# Patient Record
Sex: Female | Born: 1950 | Race: White | Hispanic: No | Marital: Married | Smoking: Never smoker
Health system: Southern US, Community
[De-identification: ages and names within clinical notes are randomized; demographics above are authoritative.]

## PROBLEM LIST (undated history)

## (undated) ENCOUNTER — Emergency Department (HOSPITAL_COMMUNITY): Admission: EM

## (undated) DIAGNOSIS — R319 Hematuria, unspecified: Secondary | ICD-10-CM

## (undated) DIAGNOSIS — N289 Disorder of kidney and ureter, unspecified: Secondary | ICD-10-CM

## (undated) DIAGNOSIS — G4730 Sleep apnea, unspecified: Secondary | ICD-10-CM

## (undated) DIAGNOSIS — N200 Calculus of kidney: Secondary | ICD-10-CM

## (undated) DIAGNOSIS — N6310 Unspecified lump in the right breast, unspecified quadrant: Secondary | ICD-10-CM

## (undated) DIAGNOSIS — E039 Hypothyroidism, unspecified: Secondary | ICD-10-CM

## (undated) DIAGNOSIS — I5032 Chronic diastolic (congestive) heart failure: Secondary | ICD-10-CM

## (undated) DIAGNOSIS — I10 Essential (primary) hypertension: Secondary | ICD-10-CM

## (undated) DIAGNOSIS — Z87442 Personal history of urinary calculi: Secondary | ICD-10-CM

## (undated) DIAGNOSIS — F32A Depression, unspecified: Secondary | ICD-10-CM

## (undated) DIAGNOSIS — G620 Drug-induced polyneuropathy: Secondary | ICD-10-CM

## (undated) DIAGNOSIS — R519 Headache, unspecified: Secondary | ICD-10-CM

## (undated) DIAGNOSIS — F329 Major depressive disorder, single episode, unspecified: Secondary | ICD-10-CM

## (undated) DIAGNOSIS — I341 Nonrheumatic mitral (valve) prolapse: Secondary | ICD-10-CM

## (undated) DIAGNOSIS — T8859XA Other complications of anesthesia, initial encounter: Secondary | ICD-10-CM

## (undated) DIAGNOSIS — T4145XA Adverse effect of unspecified anesthetic, initial encounter: Secondary | ICD-10-CM

## (undated) HISTORY — PX: NECK SURGERY: SHX720

## (undated) HISTORY — PX: APPENDECTOMY: SHX54

## (undated) HISTORY — PX: SHOULDER SURGERY: SHX246

## (undated) HISTORY — PX: TUBAL LIGATION: SHX77

## (undated) HISTORY — PX: CYSTOSCOPY W/ RETROGRADES: SHX1426

## (undated) HISTORY — PX: TONSILLECTOMY: SUR1361

## (undated) HISTORY — PX: CHOLECYSTECTOMY: SHX55

## (undated) SURGERY — Surgical Case
Anesthesia: *Unknown

---

## 1998-03-04 ENCOUNTER — Ambulatory Visit (HOSPITAL_COMMUNITY): Admission: RE | Admit: 1998-03-04 | Discharge: 1998-03-04 | Payer: Self-pay | Admitting: Internal Medicine

## 1998-03-18 ENCOUNTER — Ambulatory Visit (HOSPITAL_COMMUNITY): Admission: RE | Admit: 1998-03-18 | Discharge: 1998-03-18 | Payer: Self-pay

## 2000-01-03 ENCOUNTER — Other Ambulatory Visit: Admission: RE | Admit: 2000-01-03 | Discharge: 2000-01-03 | Payer: Self-pay | Admitting: Obstetrics and Gynecology

## 2000-01-03 ENCOUNTER — Encounter (INDEPENDENT_AMBULATORY_CARE_PROVIDER_SITE_OTHER): Payer: Self-pay | Admitting: Specialist

## 2000-01-10 ENCOUNTER — Encounter: Admission: RE | Admit: 2000-01-10 | Discharge: 2000-01-10 | Payer: Self-pay | Admitting: General Surgery

## 2000-01-10 ENCOUNTER — Encounter: Payer: Self-pay | Admitting: General Surgery

## 2000-04-07 ENCOUNTER — Emergency Department (HOSPITAL_COMMUNITY): Admission: EM | Admit: 2000-04-07 | Discharge: 2000-04-07 | Payer: Self-pay | Admitting: Emergency Medicine

## 2000-04-07 ENCOUNTER — Encounter: Payer: Self-pay | Admitting: Emergency Medicine

## 2000-08-07 ENCOUNTER — Encounter: Payer: Self-pay | Admitting: Orthopedic Surgery

## 2000-08-07 ENCOUNTER — Encounter: Admission: RE | Admit: 2000-08-07 | Discharge: 2000-08-07 | Payer: Self-pay | Admitting: Orthopedic Surgery

## 2000-09-08 ENCOUNTER — Ambulatory Visit (HOSPITAL_BASED_OUTPATIENT_CLINIC_OR_DEPARTMENT_OTHER): Admission: RE | Admit: 2000-09-08 | Discharge: 2000-09-08 | Payer: Self-pay | Admitting: Orthopedic Surgery

## 2002-03-05 ENCOUNTER — Other Ambulatory Visit: Admission: RE | Admit: 2002-03-05 | Discharge: 2002-03-05 | Payer: Self-pay | Admitting: Obstetrics and Gynecology

## 2003-09-10 ENCOUNTER — Other Ambulatory Visit: Admission: RE | Admit: 2003-09-10 | Discharge: 2003-09-10 | Payer: Self-pay | Admitting: Obstetrics and Gynecology

## 2005-03-21 ENCOUNTER — Emergency Department (HOSPITAL_COMMUNITY): Admission: EM | Admit: 2005-03-21 | Discharge: 2005-03-21 | Payer: Self-pay | Admitting: Family Medicine

## 2005-04-08 ENCOUNTER — Emergency Department (HOSPITAL_COMMUNITY): Admission: EM | Admit: 2005-04-08 | Discharge: 2005-04-08 | Payer: Self-pay | Admitting: Family Medicine

## 2005-04-14 ENCOUNTER — Emergency Department (HOSPITAL_COMMUNITY): Admission: EM | Admit: 2005-04-14 | Discharge: 2005-04-14 | Payer: Self-pay | Admitting: Emergency Medicine

## 2005-08-23 ENCOUNTER — Encounter: Admission: RE | Admit: 2005-08-23 | Discharge: 2005-08-23 | Payer: Self-pay | Admitting: General Surgery

## 2006-04-21 ENCOUNTER — Encounter: Admission: RE | Admit: 2006-04-21 | Discharge: 2006-04-21 | Payer: Self-pay | Admitting: Gastroenterology

## 2007-10-05 ENCOUNTER — Encounter: Admission: RE | Admit: 2007-10-05 | Discharge: 2007-10-05 | Payer: Self-pay | Admitting: Surgery

## 2008-07-21 ENCOUNTER — Encounter: Admission: RE | Admit: 2008-07-21 | Discharge: 2008-07-21 | Payer: Self-pay | Admitting: General Surgery

## 2010-01-25 ENCOUNTER — Encounter: Admission: RE | Admit: 2010-01-25 | Discharge: 2010-01-25 | Payer: Self-pay | Admitting: Internal Medicine

## 2010-01-29 ENCOUNTER — Encounter: Admission: RE | Admit: 2010-01-29 | Discharge: 2010-01-29 | Payer: Self-pay | Admitting: Internal Medicine

## 2010-10-14 ENCOUNTER — Encounter
Admission: RE | Admit: 2010-10-14 | Discharge: 2010-10-14 | Payer: Self-pay | Source: Home / Self Care | Attending: Internal Medicine | Admitting: Internal Medicine

## 2011-02-18 NOTE — Op Note (Signed)
[facility]  Patient:    Rebecca Steele, Rebecca Steele                    MRN: [id number] Proc. Date: 09/08/00 Adm. Date:  [id number] Attending:  Milly Jakob                           Operative Report  PREOPERATIVE DIAGNOSIS:  Pain and impingement in the left shoulder.  POSTOPERATIVE DIAGNOSIS: 1. 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 2. Anterolateral impingement with type 3 acromion.  OPERATION PERFORMED: 1. Debridement of superior labral tear anterior to posterior. 2. Anterolateral acromioplasty with debridement of bursal tissue.  SURGEON:  Harvie Junior, M.D.  ASSISTANT:  Currie Paris. Thedore Mins.  ANESTHESIA:  General.  INDICATIONS FOR PROCEDURE:  The patient is a  60 year old female with a long history of having left shoulder pain.  We evaluated her in the office.  She is felt to have obvious clinical signs of impingement.  She improved significantly with injections.  Ultimately, because of persistent pain, she went on to have an MRI which showed that she had significant outlet stenosis but no evidence of rotator cuff tear.  After failing conservative care including stretching, icing, exercises, the patient ultimately was taken to the operating room for arthroscopic subacromial decompression.  DESCRIPTION OF PROCEDURE:  The patient was taken to the operating room and after adequate anesthesia was obtained with general anesthetic, the patient was placed supine on the operating table.  The left shoulder was then prepped and draped in the usual sterile fashion.  Following this, routine arthroscopic examination of the shoulder revealed that there was some looseness up at the superior labral area.  The biceps tendon was well anchored but the superior labrum had a small area of a cleft.  This was debrided with a suction shaver. Attention was turned posteriorly where there was noted to be fairly significant fraying.  This was debrided with a  suction shaver.  Attention was then turned to the rotator cuff insertion which was pristine, no evidence of tearing. There was some undersurface synovial proliferation on the rotator cuff but certainly not dramatic. This was debrided with a suction shaver. Attention was then turned to the subacromial space where the obvious large anterior inferior spur was identified.  This was debrided with a motorized 6.5 mm bur back to a stable situation.  This was done from both the side and the back.  The camera was switched accordingly to make sure an adequate acromioplasty was done both anteriorly and laterally.  The distal clavicle was identified although the [facility] joint was preserved to try to maintain its integrity.  At this point attention was turned to the bursa where a massive bursectomy was undertaken. At this point the shoulder was copiously irrigated and suctioned dry.  The arthroscopic portals were closed with Steri-Strips.  A sterile compressive dressing was applied.  The patient was taken to the recovery room where she was noted to be in satisfactory condition.  Estimated blood loss for this procedure was none. DD:  09/08/00 TD:  09/08/00 Job: [id number]

## 2011-03-28 ENCOUNTER — Encounter (INDEPENDENT_AMBULATORY_CARE_PROVIDER_SITE_OTHER): Payer: Self-pay | Admitting: General Surgery

## 2011-05-19 ENCOUNTER — Other Ambulatory Visit: Payer: Self-pay | Admitting: Internal Medicine

## 2011-05-19 ENCOUNTER — Other Ambulatory Visit: Payer: Self-pay | Admitting: *Deleted

## 2011-05-19 DIAGNOSIS — N63 Unspecified lump in unspecified breast: Secondary | ICD-10-CM

## 2011-05-30 ENCOUNTER — Ambulatory Visit
Admission: RE | Admit: 2011-05-30 | Discharge: 2011-05-30 | Disposition: A | Discharge status: Home / Self Care | Payer: Self-pay | Source: Ambulatory Visit | Attending: Internal Medicine | Admitting: Internal Medicine

## 2011-05-30 DIAGNOSIS — N63 Unspecified lump in unspecified breast: Secondary | ICD-10-CM

## 2011-08-23 ENCOUNTER — Emergency Department (HOSPITAL_BASED_OUTPATIENT_CLINIC_OR_DEPARTMENT_OTHER)
Admission: EM | Admit: 2011-08-23 | Discharge: 2011-08-23 | Disposition: A | Discharge status: Home / Self Care | Payer: BC Managed Care – PPO | Attending: Emergency Medicine | Admitting: Emergency Medicine

## 2011-08-23 ENCOUNTER — Emergency Department (INDEPENDENT_AMBULATORY_CARE_PROVIDER_SITE_OTHER): Payer: BC Managed Care – PPO

## 2011-08-23 ENCOUNTER — Encounter: Payer: Self-pay | Admitting: *Deleted

## 2011-08-23 DIAGNOSIS — N134 Hydroureter: Secondary | ICD-10-CM

## 2011-08-23 DIAGNOSIS — N1330 Unspecified hydronephrosis: Secondary | ICD-10-CM

## 2011-08-23 DIAGNOSIS — R109 Unspecified abdominal pain: Secondary | ICD-10-CM

## 2011-08-23 DIAGNOSIS — G8918 Other acute postprocedural pain: Secondary | ICD-10-CM

## 2011-08-23 HISTORY — DX: Calculus of kidney: N20.0

## 2011-08-23 HISTORY — DX: Depression, unspecified: F32.A

## 2011-08-23 HISTORY — DX: Hematuria, unspecified: R31.9

## 2011-08-23 HISTORY — DX: Nonrheumatic mitral (valve) prolapse: I34.1

## 2011-08-23 HISTORY — DX: Disorder of kidney and ureter, unspecified: N28.9

## 2011-08-23 HISTORY — DX: Essential (primary) hypertension: I10

## 2011-08-23 HISTORY — DX: Major depressive disorder, single episode, unspecified: F32.9

## 2011-08-23 LAB — BASIC METABOLIC PANEL
Calcium: 9 mg/dL (ref 8.4–10.5)
Creatinine, Ser: 1.2 mg/dL — ABNORMAL HIGH (ref 0.50–1.10)
GFR calc Af Amer: 56 mL/min — ABNORMAL LOW (ref >90)

## 2011-08-23 LAB — URINALYSIS, ROUTINE W REFLEX MICROSCOPIC
Ketones, ur: NEGATIVE mg/dL
Protein, ur: NEGATIVE mg/dL
Specific Gravity, Urine: 1.023 (ref 1.005–1.030)
Urobilinogen, UA: 0.2 mg/dL (ref 0.0–1.0)
pH: 7 (ref 5.0–8.0)

## 2011-08-23 LAB — CBC
HCT: 41.1 % (ref 36.0–46.0)
Hemoglobin: 14.3 g/dL (ref 12.0–15.0)
Platelets: 197 10*3/uL (ref 150–400)
RDW: 13.7 % (ref 11.5–15.5)
WBC: 5.7 10*3/uL (ref 4.0–10.5)

## 2011-08-23 LAB — DIFFERENTIAL
Basophils Absolute: 0.1 10*3/uL (ref 0.0–0.1)
Basophils Relative: 1 % (ref 0–1)
Eosinophils Relative: 3 % (ref 0–5)
Lymphs Abs: 1.4 10*3/uL (ref 0.7–4.0)
Monocytes Absolute: 0.8 10*3/uL (ref 0.1–1.0)
Neutro Abs: 3.3 10*3/uL (ref 1.7–7.7)

## 2011-08-23 LAB — URINE MICROSCOPIC-ADD ON

## 2011-08-23 MED ORDER — HYDROMORPHONE HCL PF 1 MG/ML IJ SOLN
1.0000 mg | Freq: Once | INTRAMUSCULAR | Status: AC
  Administered 2011-08-23: 1 mg via INTRAVENOUS
  Filled 2011-08-23: qty 1

## 2011-08-23 MED ORDER — ONDANSETRON HCL 4 MG/2ML IJ SOLN
4.0000 mg | Freq: Once | INTRAMUSCULAR | Status: AC
  Administered 2011-08-23: 4 mg via INTRAVENOUS
  Filled 2011-08-23: qty 2

## 2011-08-23 MED ORDER — OXYCODONE-ACETAMINOPHEN 5-325 MG PO TABS: 1.0000 | ORAL_TABLET | Freq: Four times a day (QID) | ORAL | Status: AC | PRN

## 2011-08-23 MED ORDER — SODIUM CHLORIDE 0.9 % IV BOLUS (SEPSIS)
1000.0000 mL | Freq: Once | INTRAVENOUS | Status: AC
  Administered 2011-08-23: 1000 mL via INTRAVENOUS

## 2011-08-23 MED ORDER — ONDANSETRON 8 MG PO TBDP
8.0000 mg | ORAL_TABLET | Freq: Once | ORAL | Status: AC
  Administered 2011-08-23: 8 mg via ORAL
  Filled 2011-08-23: qty 1

## 2011-08-23 NOTE — ED Notes (Signed)
Pt report severe right side flank pain since 11pm- vomited multiple times- had retrograde pyelogram and cystoscopy today

## 2011-08-23 NOTE — ED Notes (Signed)
Pt sleeping with eyes closed but is arousable, remains on O2@2L  via N/C, PO 97%. Family at bs, SR up x2.

## 2011-08-23 NOTE — ED Notes (Signed)
Pt stood up to use restroom and vomited x1, MD made aware, new orders rec'd and pt medicated per order.

## 2011-08-23 NOTE — ED Notes (Signed)
PO ranging from 88-90% on room air. O2 applied at 2L/min via N/C.

## 2011-08-23 NOTE — ED Provider Notes (Signed)
History     CSN: [id number] Arrival date & time: 08/23/2011  1:49 AM   First MD Initiated Contact with Patient 08/23/11 0157      Chief Complaint  Patient presents with  . Flank Pain    Patient is a 60 y.o. female presenting with flank pain. The history is provided by the patient and a relative.  Flank Pain This is a new problem. The current episode started 3 to 5 hours ago. The problem occurs constantly. The problem has been gradually worsening. Pertinent negatives include no chest pain and no abdominal pain. The symptoms are aggravated by nothing. The symptoms are relieved by nothing.  Pt reports having a cystoscopy and pyelogram yesterday for chronic hematuria (she also reports h/o chronic kidney disease not on dialysis).  No issues with procedure, but several hrs later started having right flank pain with worsening intensity.  She has never had this before.  Nothing improves her symptoms No cp/sob She reports nausea/vomiting as well  PMH - chronic kidney disease  Past Surgical History - appendectomy, cholecystectomy, cystoscopy  No family history on file.  History  Substance Use Topics  . Smoking status: Not on file  . Smokeless tobacco: Not on file  . Alcohol Use: Not on file    OB History    Grav Para Term Preterm Abortions TAB SAB Ect Mult Living                  Review of Systems  Cardiovascular: Negative for chest pain.  Gastrointestinal: Negative for abdominal pain.  Genitourinary: Positive for flank pain.  All other systems reviewed and are negative.    Allergies  Iohexol  Home Medications  No current outpatient prescriptions on file.  BP 146/78  Pulse 78  Temp(Src) 97.7 F (36.5 C) (Oral)  Resp 20  SpO2 98%  Physical Exam  CONSTITUTIONAL: Well developed/well nourished, uncomfortable appearing HEAD AND FACE: Normocephalic/atraumatic EYES: EOMI/PERRL ENMT: Mucous membranes moist NECK: supple no meningeal signs SPINE:entire spine  nontender CV: S1/S2 noted, no murmurs/rubs/gallops noted LUNGS: Lungs are clear to auscultation bilaterally, no apparent distress ABDOMEN: soft, nontender, no rebound or guarding WU:JWJXB cva tenderness, no bruising noted NEURO: Pt is awake/alert, moves all extremitiesx4 EXTREMITIES: pulses normal, full ROM SKIN: warm, color normal PSYCH: no abnormalities of mood noted   ED Course  Procedures    Labs Reviewed  BASIC METABOLIC PANEL  CBC  DIFFERENTIAL  URINALYSIS, ROUTINE W REFLEX MICROSCOPIC   2:09 AM Pt with intense flank pain s/p cysto/pyelogram She appears uncomfortable Vitals stable at this time I placed a call for her urologist since she just had the procedure  2:43 AM I spoke to Dr Brita Romp with urology (on for dr Corbin Ade) We discussed need to get CT imaging If CT unremarkable and pain controlled, can see dr Corbin Ade in the AM Pt reported allergy to IV dye, ct noncontrast ordered  3:41 AM Pt improved I spoke to Dr Brita Romp, urology and we discussed CT findings.  He feels if she is improved she can see dr Pete Glatter later this morning  4:27 AM Pt much improved with her pain, though is somnolent but arousable  6:37 AM Pt is ready for discharge home, I asked her to call dr Pete Glatter later today  MDM  Nursing notes reviewed and considered in documentation All labs/vitals reviewed and considered         Joya Gaskins, MD 08/23/11 [phone]

## 2011-08-23 NOTE — ED Notes (Signed)
MD at bedside. 

## 2011-08-23 NOTE — ED Notes (Signed)
Pt given PO fluids per MD order 

## 2011-08-23 NOTE — ED Notes (Signed)
Pt remains sleepy, O2 decreased to 1L by MD, PO remains in the mid 90's. Family at bs, resps even and unlabored.

## 2011-08-23 NOTE — ED Notes (Signed)
Pt return from CT, requesting more pain meds. MD made aware.

## 2011-08-23 NOTE — ED Notes (Signed)
Transported to CT 

## 2011-08-23 NOTE — ED Notes (Signed)
O2 sats up to 98-100% after O2 applied, MD aware.

## 2012-10-31 ENCOUNTER — Encounter (INDEPENDENT_AMBULATORY_CARE_PROVIDER_SITE_OTHER): Payer: Self-pay | Admitting: Surgery

## 2012-10-31 NOTE — Progress Notes (Signed)
Created to generate my chart sign up

## 2012-11-01 ENCOUNTER — Ambulatory Visit (INDEPENDENT_AMBULATORY_CARE_PROVIDER_SITE_OTHER): Payer: Self-pay | Admitting: Surgery

## 2012-11-01 DIAGNOSIS — Z0000 Encounter for general adult medical examination without abnormal findings: Secondary | ICD-10-CM

## 2014-04-21 ENCOUNTER — Other Ambulatory Visit: Payer: Self-pay | Admitting: Internal Medicine

## 2014-04-21 DIAGNOSIS — Z1231 Encounter for screening mammogram for malignant neoplasm of breast: Secondary | ICD-10-CM

## 2014-04-29 ENCOUNTER — Ambulatory Visit
Admission: RE | Admit: 2014-04-29 | Discharge: 2014-04-29 | Disposition: A | Discharge status: Home / Self Care | Payer: BC Managed Care – PPO | Source: Ambulatory Visit | Attending: Internal Medicine | Admitting: Internal Medicine

## 2014-04-29 DIAGNOSIS — Z1231 Encounter for screening mammogram for malignant neoplasm of breast: Secondary | ICD-10-CM

## 2014-06-08 ENCOUNTER — Encounter (HOSPITAL_BASED_OUTPATIENT_CLINIC_OR_DEPARTMENT_OTHER): Payer: Self-pay | Admitting: Emergency Medicine

## 2014-06-08 ENCOUNTER — Emergency Department (HOSPITAL_BASED_OUTPATIENT_CLINIC_OR_DEPARTMENT_OTHER)
Admission: EM | Admit: 2014-06-08 | Discharge: 2014-06-08 | Disposition: A | Discharge status: Home / Self Care | Payer: BC Managed Care – PPO | Attending: Emergency Medicine | Admitting: Emergency Medicine

## 2014-06-08 ENCOUNTER — Emergency Department (HOSPITAL_BASED_OUTPATIENT_CLINIC_OR_DEPARTMENT_OTHER): Payer: BC Managed Care – PPO

## 2014-06-08 DIAGNOSIS — Z8659 Personal history of other mental and behavioral disorders: Secondary | ICD-10-CM | POA: Insufficient documentation

## 2014-06-08 DIAGNOSIS — Y929 Unspecified place or not applicable: Secondary | ICD-10-CM | POA: Diagnosis not present

## 2014-06-08 DIAGNOSIS — W540XXA Bitten by dog, initial encounter: Secondary | ICD-10-CM | POA: Diagnosis not present

## 2014-06-08 DIAGNOSIS — Y939 Activity, unspecified: Secondary | ICD-10-CM | POA: Insufficient documentation

## 2014-06-08 DIAGNOSIS — Z87448 Personal history of other diseases of urinary system: Secondary | ICD-10-CM | POA: Diagnosis not present

## 2014-06-08 DIAGNOSIS — S61259A Open bite of unspecified finger without damage to nail, initial encounter: Secondary | ICD-10-CM

## 2014-06-08 DIAGNOSIS — I10 Essential (primary) hypertension: Secondary | ICD-10-CM | POA: Insufficient documentation

## 2014-06-08 DIAGNOSIS — Z87442 Personal history of urinary calculi: Secondary | ICD-10-CM | POA: Diagnosis not present

## 2014-06-08 DIAGNOSIS — Z23 Encounter for immunization: Secondary | ICD-10-CM | POA: Diagnosis not present

## 2014-06-08 DIAGNOSIS — S61209A Unspecified open wound of unspecified finger without damage to nail, initial encounter: Secondary | ICD-10-CM | POA: Diagnosis present

## 2014-06-08 MED ORDER — AMOXICILLIN-POT CLAVULANATE 875-125 MG PO TABS
1.0000 | ORAL_TABLET | Freq: Once | ORAL | Status: AC
  Administered 2014-06-08: 1 via ORAL
  Filled 2014-06-08: qty 1

## 2014-06-08 MED ORDER — ONDANSETRON 4 MG PO TBDP
4.0000 mg | ORAL_TABLET | Freq: Once | ORAL | Status: AC
  Administered 2014-06-08: 4 mg via ORAL
  Filled 2014-06-08: qty 1

## 2014-06-08 MED ORDER — LIDOCAINE HCL 2 % IJ SOLN
INTRAMUSCULAR | Status: AC
  Filled 2014-06-08: qty 20

## 2014-06-08 MED ORDER — RABIES IMMUNE GLOBULIN 150 UNIT/ML IM INJ
20.0000 [IU]/kg | INJECTION | Freq: Once | INTRAMUSCULAR | Status: AC
  Administered 2014-06-08: 1575 [IU] via INTRAMUSCULAR
  Filled 2014-06-08: qty 12

## 2014-06-08 MED ORDER — TETANUS-DIPHTH-ACELL PERTUSSIS 5-2.5-18.5 LF-MCG/0.5 IM SUSP
0.5000 mL | Freq: Once | INTRAMUSCULAR | Status: AC
  Administered 2014-06-08: 0.5 mL via INTRAMUSCULAR
  Filled 2014-06-08: qty 0.5

## 2014-06-08 MED ORDER — AMOXICILLIN-POT CLAVULANATE 875-125 MG PO TABS: 1.0000 | ORAL_TABLET | Freq: Two times a day (BID) | ORAL | Status: DC

## 2014-06-08 MED ORDER — RABIES VACCINE, PCEC IM SUSR
1.0000 mL | Freq: Once | INTRAMUSCULAR | Status: AC
  Administered 2014-06-08: 1 mL via INTRAMUSCULAR
  Filled 2014-06-08: qty 1

## 2014-06-08 NOTE — Discharge Instructions (Signed)

## 2014-06-08 NOTE — ED Provider Notes (Signed)
CSN: [id number]     Arrival date & time 06/08/14  2020 History  This chart was scribed for Ephraim Hamburger, MD by Cathie Hoops, ED Scribe. The patient was seen in [place]. The patient's care was started at 9:35 PM.       Chief Complaint  Patient presents with  . Animal Bite   The history is provided by the patient. No language interpreter was used.   HPI Comments: Rebecca Steele is a 63 y.o. female who presents to the Emergency Department complaining of a dog bite on the right pinky finger a few hours ago. Pt is able to bend her fingers. Pt has bitten by an unknown dog. Pt states the dog looked like a terrier. Pt is unable to specify her last tetanus shot. Pt filled out an animal control form. The dog was never caught. Pt notes she does have some moderate pain at the injury site and describes it as throbbing.  Pt can take Augmentin and notes she has some nausea with its usage with no other symptoms.   Past Medical History  Diagnosis Date  . Hematuria - cause not known   . Kidney stones   . Renal disease   . Hypertension   . Mitral valve prolapse   . Depression    Past Surgical History  Procedure Laterality Date  . Appendectomy    . Neck surgery    . Cystoscopy w/ retrogrades    . Shoulder surgery    . Cholecystectomy    . Tonsillectomy     No family history on file. History  Substance Use Topics  . Smoking status: Never Smoker   . Smokeless tobacco: Not on file  . Alcohol Use: No   OB History   Grav Para Term Preterm Abortions TAB SAB Ect Mult Living                 Review of Systems  Skin: Positive for wound.  All other systems reviewed and are negative.     Allergies  Valium; Doxycycline; Iohexol; and Macrodantin  Home Medications   Prior to Admission medications   Not on File   Triage Vitals: BP 146/82  Pulse 87  Temp(Src) 98 F (36.7 C) (Oral)  Resp 16  Ht 5\' 7"  (1.702 m)  Wt 173 lb (78.472 kg)  BMI 27.09 kg/m2  SpO2 98% Physical Exam   Nursing note and vitals reviewed. Constitutional: She is oriented to person, place, and time. She appears well-developed and well-nourished. No distress.  HENT:  Head: Normocephalic and atraumatic.  Cardiovascular: Normal rate and intact distal pulses.   Pulmonary/Chest: Effort normal. No respiratory distress.  Musculoskeletal: Normal range of motion.       Hands: Neurological: She is alert and oriented to person, place, and time.  Skin: Skin is warm and dry.  Psychiatric: She has a normal mood and affect. Her behavior is normal.    ED Course  Procedures (including critical care time) DIAGNOSTIC STUDIES: Oxygen Saturation is 98% on Ra, normal by my interpretation.    COORDINATION OF CARE: 9:38 PM- Pt denies pain medication at this time. Patient informed of current plan for treatment and evaluation and agrees with plan at this time.    Labs Review Labs Reviewed - No data to display  Imaging Review Dg Finger Little Right  06/08/2014   CLINICAL DATA:  Dog bite to 5th finger  EXAM: RIGHT LITTLE FINGER 2+V  COMPARISON:  None.  FINDINGS: No fracture or  dislocation is seen.  The joint spaces are preserved.  The visualized soft tissues are unremarkable.  No radiopaque foreign body is seen.  IMPRESSION: No fracture, dislocation, or radiopaque foreign body is seen.   Electronically Signed   By: Julian Hy M.D.   On: 06/08/2014 22:04     EKG Interpretation None      MDM   Final diagnoses:  Dog bite of finger, initial encounter    Patient with dog bite from unknown dog that was not caught. Due to this, will be given rabies vaccine and immunoglobulin. Nurse injected IG at side and then rest as an IM injection. Tdap also updated. Xray is benign, and injury appears minimal. Will refer to PCP and/or hand. Given augmentin for prophylaxis given site of bite, and discussed strict return precautions. Patient will f/u here or urgent care for her rabies vaccines.  This chart was scribed  in my presence and reviewed by me personally.   Ephraim Hamburger, MD 06/09/14 [id number]

## 2014-06-08 NOTE — ED Notes (Signed)
Pt bite on right 5th finger by unknown dog. Abrasion noted to finger. Bleeding controlled

## 2014-06-11 ENCOUNTER — Encounter (HOSPITAL_COMMUNITY): Payer: Self-pay | Admitting: Emergency Medicine

## 2014-06-11 ENCOUNTER — Emergency Department (HOSPITAL_COMMUNITY)
Admission: EM | Admit: 2014-06-11 | Discharge: 2014-06-11 | Disposition: A | Discharge status: Home / Self Care | Payer: BC Managed Care – PPO | Source: Home / Self Care

## 2014-06-11 DIAGNOSIS — Z203 Contact with and (suspected) exposure to rabies: Secondary | ICD-10-CM

## 2014-06-11 MED ORDER — RABIES VACCINE, PCEC IM SUSR
1.0000 mL | Freq: Once | INTRAMUSCULAR | Status: AC
  Administered 2014-06-11: 1 mL via INTRAMUSCULAR

## 2014-06-11 MED ORDER — RABIES VACCINE, PCEC IM SUSR
INTRAMUSCULAR | Status: AC
  Filled 2014-06-11: qty 1

## 2014-06-11 NOTE — ED Notes (Signed)
Pt here  For  Rabies  Vaccine      verbalizes  No   Complaints

## 2014-06-11 NOTE — Discharge Instructions (Signed)
Return  As  Directed  For   [place]  If  Any  Problems

## 2014-06-15 ENCOUNTER — Encounter (HOSPITAL_COMMUNITY): Payer: Self-pay | Admitting: Emergency Medicine

## 2014-06-15 ENCOUNTER — Emergency Department (INDEPENDENT_AMBULATORY_CARE_PROVIDER_SITE_OTHER)
Admission: EM | Admit: 2014-06-15 | Discharge: 2014-06-15 | Disposition: A | Discharge status: Home / Self Care | Payer: BC Managed Care – PPO | Source: Home / Self Care

## 2014-06-15 DIAGNOSIS — Z203 Contact with and (suspected) exposure to rabies: Secondary | ICD-10-CM

## 2014-06-15 MED ORDER — RABIES VACCINE, PCEC IM SUSR
1.0000 mL | Freq: Once | INTRAMUSCULAR | Status: AC
  Administered 2014-06-15: 1 mL via INTRAMUSCULAR

## 2014-06-15 MED ORDER — RABIES VACCINE, PCEC IM SUSR
INTRAMUSCULAR | Status: AC
  Filled 2014-06-15: qty 1

## 2014-06-15 NOTE — ED Notes (Signed)
Patient bitten by unknown dog 9/6.  9/6 is day zero.  Patient here today for next injection in rabies series.  Today is day 7 in rabies series.

## 2014-06-15 NOTE — Discharge Instructions (Signed)
Return to ucc on 06/22/2014 for final rabies injection.  Return sooner if any questions

## 2014-06-22 ENCOUNTER — Emergency Department (HOSPITAL_COMMUNITY)
Admission: EM | Admit: 2014-06-22 | Discharge: 2014-06-22 | Disposition: A | Discharge status: Home / Self Care | Payer: BC Managed Care – PPO | Source: Home / Self Care | Attending: Family Medicine | Admitting: Family Medicine

## 2014-06-22 ENCOUNTER — Encounter (HOSPITAL_COMMUNITY): Payer: Self-pay | Admitting: Emergency Medicine

## 2014-06-22 DIAGNOSIS — R05 Cough: Secondary | ICD-10-CM

## 2014-06-22 DIAGNOSIS — Z203 Contact with and (suspected) exposure to rabies: Secondary | ICD-10-CM

## 2014-06-22 DIAGNOSIS — R059 Cough, unspecified: Secondary | ICD-10-CM

## 2014-06-22 MED ORDER — GUAIFENESIN-CODEINE 100-10 MG/5ML PO SOLN: 5.0000 mL | Freq: Every evening | ORAL | Status: DC | PRN

## 2014-06-22 MED ORDER — RABIES VACCINE, PCEC IM SUSR
INTRAMUSCULAR | Status: AC
  Filled 2014-06-22: qty 1

## 2014-06-22 MED ORDER — RABIES VACCINE, PCEC IM SUSR
1.0000 mL | Freq: Once | INTRAMUSCULAR | Status: AC
Start: 2014-06-22 — End: 2014-06-22
  Administered 2014-06-22: 1 mL via INTRAMUSCULAR

## 2014-06-22 NOTE — ED Provider Notes (Signed)
Rebecca Steele is a 64 y.o. female who presents to Urgent Care today for cough. Patient is a one-day history of cough. The cough occurred last night and interfered with sleep. She denies any fevers or chills nausea vomiting or diarrhea. She notes mild sneezing. She's tried over-the-counter allergy medicine which helped.  Patient is also here for her rabies vaccination.   Past Medical History  Diagnosis Date  . Hematuria - cause not known   . Kidney stones   . Renal disease   . Hypertension   . Mitral valve prolapse   . Depression    History  Substance Use Topics  . Smoking status: Never Smoker   . Smokeless tobacco: Not on file  . Alcohol Use: No   ROS as above Medications: No current facility-administered medications for this encounter.   Current Outpatient Prescriptions  Medication Sig Dispense Refill  . guaiFENesin-codeine 100-10 MG/5ML syrup Take 5 mLs by mouth at bedtime as needed for cough.  120 mL  0    Exam:  BP 127/91  Pulse 86  Temp(Src) 99.7 F (37.6 C) (Oral)  Resp 16  SpO2 95% Gen: Well NAD HEENT: EOMI,  MMM posterior pharynx with cobblestoning. Normal tympanic membranes bilaterally. Lungs: Normal work of breathing. CTABL Heart: RRR no MRG Abd: NABS, Soft. Nondistended, Nontender Exts: Brisk capillary refill, warm and well perfused.   No results found for this or any previous visit (from the past 24 hour(s)). No results found.  Assessment and Plan: 63 y.o. female with  1) cough. Likely viral. Patient declined nasal spray. Trial of codeine containing cough medication. Continue over-the-counter allergy medications. 2) rabies vaccine: Continue per protocol  Discussed warning signs or symptoms. Please see discharge instructions. Patient expresses understanding.     Gregor Hams, MD 06/22/14 1537

## 2014-06-22 NOTE — Discharge Instructions (Signed)
Thank you for coming in today. Use cough medication as needed Call or go to the emergency room if you get worse, have trouble breathing, have chest pains, or palpitations.    Cough, Adult  A cough is a reflex that helps clear your throat and airways. It can help heal the body or may be a reaction to an irritated airway. A cough may only last 2 or 3 weeks (acute) or may last more than 8 weeks (chronic).  CAUSES Acute cough:  Viral or bacterial infections. Chronic cough:  Infections.  Allergies.  Asthma.  Post-nasal drip.  Smoking.  Heartburn or acid reflux.  Some medicines.  Chronic lung problems (COPD).  Cancer. SYMPTOMS   Cough.  Fever.  Chest pain.  Increased breathing rate.  High-pitched whistling sound when breathing (wheezing).  Colored mucus that you cough up (sputum). TREATMENT   A bacterial cough may be treated with antibiotic medicine.  A viral cough must run its course and will not respond to antibiotics.  Your caregiver may recommend other treatments if you have a chronic cough. HOME CARE INSTRUCTIONS   Only take over-the-counter or prescription medicines for pain, discomfort, or fever as directed by your caregiver. Use cough suppressants only as directed by your caregiver.  Use a cold steam vaporizer or humidifier in your bedroom or home to help loosen secretions.  Sleep in a semi-upright position if your cough is worse at night.  Rest as needed.  Stop smoking if you smoke. SEEK IMMEDIATE MEDICAL CARE IF:   You have pus in your sputum.  Your cough starts to worsen.  You cannot control your cough with suppressants and are losing sleep.  You begin coughing up blood.  You have difficulty breathing.  You develop pain which is getting worse or is uncontrolled with medicine.  You have a fever. MAKE SURE YOU:   Understand these instructions.  Will watch your condition.  Will get help right away if you are not doing well or get  worse. Document Released: 03/18/2011 Document Revised: 12/12/2011 Document Reviewed: 03/18/2011 [facility] Patient Information 2015 [place], [place]. This information is not intended to replace advice given to you by your health care provider. Make sure you discuss any questions you have with your health care provider.

## 2014-06-22 NOTE — ED Notes (Signed)
Patient complains of cough that started last night

## 2014-12-02 ENCOUNTER — Other Ambulatory Visit: Payer: Self-pay | Admitting: Internal Medicine

## 2014-12-02 DIAGNOSIS — N63 Unspecified lump in unspecified breast: Secondary | ICD-10-CM

## 2014-12-04 ENCOUNTER — Ambulatory Visit
Admission: RE | Admit: 2014-12-04 | Discharge: 2014-12-04 | Disposition: A | Discharge status: Home / Self Care | Payer: BLUE CROSS/BLUE SHIELD | Source: Ambulatory Visit | Attending: Internal Medicine | Admitting: Internal Medicine

## 2014-12-04 DIAGNOSIS — N63 Unspecified lump in unspecified breast: Secondary | ICD-10-CM

## 2015-05-07 ENCOUNTER — Other Ambulatory Visit: Payer: Self-pay | Admitting: Orthopedic Surgery

## 2015-05-13 ENCOUNTER — Encounter (HOSPITAL_BASED_OUTPATIENT_CLINIC_OR_DEPARTMENT_OTHER): Payer: Self-pay | Admitting: *Deleted

## 2015-05-13 ENCOUNTER — Other Ambulatory Visit: Payer: Self-pay | Admitting: Orthopedic Surgery

## 2015-05-13 NOTE — H&P (Addendum)
Rebecca Steele is an 64 y.o. female.   CC / Reason for Visit: Right long finger MCP joint pain HPI: This patient returns for reevaluation, indicating that she is better but still not 100% well.  She reports that she has a dull ache and some tightness into her right long finger when she plays piano.  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  She states that she has not taken any medications nor has she tried ice.  She denies any one mechanism of injury and reports that she did attempt rest which helped somewhat but also increased the stiffness into that joint.  Past Medical History  Diagnosis Date  . Hematuria - cause not known   . Kidney stones   . Renal disease   . Hypertension   . Mitral valve prolapse   . Depression     Past Surgical History  Procedure Laterality Date  . Appendectomy    . Neck surgery    . Cystoscopy w/ retrogrades    . Shoulder surgery    . Cholecystectomy    . Tonsillectomy      No family history on file. Social History:  reports that she has never smoked. She does not have any smokeless tobacco history on file. She reports that she does not drink alcohol or use illicit drugs.  Allergies:  Allergies  Allergen Reactions  . Valium Anaphylaxis  . Doxycycline Swelling  . Iohexol      Desc: Pt. stated she had iv contrast around 25 yrs ago and had itching on her neck.  she took benadryl and it went away.  The rad recommended premeds and be done tomorrow but the pt.did not want to do that so we did her w/o iv contrast today., Onset Date: [id number]   . Macrodantin Nausea And Vomiting    No prescriptions prior to admission    No results found for this or any previous visit (from the past 48 hour(s)). No results found.  Review of Systems  All other systems reviewed and are negative.   There  were no vitals taken for this visit. Physical Exam  Constitutional:  WD, WN, NAD HEENT:  NCAT, EOMI Neuro/Psych:  Alert & oriented to person, place, and time; appropriate mood & affect Lymphatic: No generalized UE edema or lymphadenopathy Extremities / MSK:  Both UE are normal with respect to appearance, ranges of motion, joint stability, muscle strength/tone, sensation, & perfusion except as otherwise noted:  Mild tenderness over the A1 pulley with some mild catching noted.  Labs / X-rays:  None today.  Assessment: Suspected right long finger stenosing tenosynovitis--improved somewhat but not completely healed.  Plan:  Findings were discussed with the patient.   Right  long finger trigger finger release was discussed. The details of the operative procedure were discussed with the patient.  Questions were invited and answered.  In addition to the goal of the procedure, the risks of the and anesthetic risks were reviewed.  No specific outcome was guaranteed or implied.  Informed consent was obtained.   Shashank Kwasnik A. 05/13/2015, 12:14 PM

## 2015-05-13 NOTE — Progress Notes (Signed)
EKG obtained from Dr. Ellouise Newer.  Recent BMET shows K of 3.1, pt starting on potassium supplement today.  Will recheck [facility].

## 2015-05-18 ENCOUNTER — Encounter (HOSPITAL_BASED_OUTPATIENT_CLINIC_OR_DEPARTMENT_OTHER): Payer: Self-pay | Admitting: *Deleted

## 2015-05-18 ENCOUNTER — Ambulatory Visit (HOSPITAL_BASED_OUTPATIENT_CLINIC_OR_DEPARTMENT_OTHER)
Admission: RE | Admit: 2015-05-18 | Discharge: 2015-05-18 | Disposition: A | Discharge status: Home / Self Care | Payer: BLUE CROSS/BLUE SHIELD | Source: Ambulatory Visit | Attending: Orthopedic Surgery | Admitting: Orthopedic Surgery

## 2015-05-18 ENCOUNTER — Ambulatory Visit (HOSPITAL_BASED_OUTPATIENT_CLINIC_OR_DEPARTMENT_OTHER): Payer: BLUE CROSS/BLUE SHIELD | Admitting: Certified Registered"

## 2015-05-18 ENCOUNTER — Encounter (HOSPITAL_BASED_OUTPATIENT_CLINIC_OR_DEPARTMENT_OTHER)
Admission: RE | Disposition: A | Discharge status: Home / Self Care | Payer: Self-pay | Source: Ambulatory Visit | Attending: Orthopedic Surgery

## 2015-05-18 DIAGNOSIS — I10 Essential (primary) hypertension: Secondary | ICD-10-CM | POA: Insufficient documentation

## 2015-05-18 DIAGNOSIS — Z87442 Personal history of urinary calculi: Secondary | ICD-10-CM | POA: Diagnosis not present

## 2015-05-18 DIAGNOSIS — Z885 Allergy status to narcotic agent status: Secondary | ICD-10-CM | POA: Diagnosis not present

## 2015-05-18 DIAGNOSIS — I341 Nonrheumatic mitral (valve) prolapse: Secondary | ICD-10-CM | POA: Diagnosis not present

## 2015-05-18 DIAGNOSIS — M65331 Trigger finger, right middle finger: Secondary | ICD-10-CM | POA: Diagnosis not present

## 2015-05-18 DIAGNOSIS — Z881 Allergy status to other antibiotic agents status: Secondary | ICD-10-CM | POA: Insufficient documentation

## 2015-05-18 DIAGNOSIS — Z888 Allergy status to other drugs, medicaments and biological substances status: Secondary | ICD-10-CM | POA: Diagnosis not present

## 2015-05-18 DIAGNOSIS — F329 Major depressive disorder, single episode, unspecified: Secondary | ICD-10-CM | POA: Insufficient documentation

## 2015-05-18 HISTORY — PX: TRIGGER FINGER RELEASE: SHX641

## 2015-05-18 SURGERY — RELEASE, A1 PULLEY, FOR TRIGGER FINGER
Anesthesia: General | Site: Finger | Laterality: Right

## 2015-05-18 MED ORDER — LACTATED RINGERS IV SOLN: INTRAVENOUS | Status: DC

## 2015-05-18 MED ORDER — OXYCODONE HCL 5 MG PO TABS: 5.0000 mg | ORAL_TABLET | Freq: Once | ORAL | Status: DC | PRN

## 2015-05-18 MED ORDER — PROPOFOL INFUSION 10 MG/ML OPTIME
INTRAVENOUS | Status: DC | PRN
  Administered 2015-05-18: 75 ug/kg/min via INTRAVENOUS

## 2015-05-18 MED ORDER — HYDROCODONE-ACETAMINOPHEN 5-325 MG PO TABS: 1.0000 | ORAL_TABLET | ORAL | Status: DC | PRN

## 2015-05-18 MED ORDER — LIDOCAINE HCL (CARDIAC) 20 MG/ML IV SOLN
INTRAVENOUS | Status: DC | PRN
  Administered 2015-05-18: 60 mg via INTRAVENOUS

## 2015-05-18 MED ORDER — GLYCOPYRROLATE 0.2 MG/ML IJ SOLN: 0.2000 mg | Freq: Once | INTRAMUSCULAR | Status: DC | PRN

## 2015-05-18 MED ORDER — OXYCODONE HCL 5 MG/5ML PO SOLN: 5.0000 mg | Freq: Once | ORAL | Status: DC | PRN

## 2015-05-18 MED ORDER — LIDOCAINE HCL 2 % IJ SOLN
INTRAMUSCULAR | Status: DC | PRN
  Administered 2015-05-18: 2.5 mL

## 2015-05-18 MED ORDER — MIDAZOLAM HCL 2 MG/2ML IJ SOLN
INTRAMUSCULAR | Status: AC
  Filled 2015-05-18: qty 2

## 2015-05-18 MED ORDER — ONDANSETRON HCL 4 MG/2ML IJ SOLN: 4.0000 mg | Freq: Once | INTRAMUSCULAR | Status: DC | PRN

## 2015-05-18 MED ORDER — LACTATED RINGERS IV SOLN
INTRAVENOUS | Status: DC
  Administered 2015-05-18 (×2): via INTRAVENOUS

## 2015-05-18 MED ORDER — 0.9 % SODIUM CHLORIDE (POUR BTL) OPTIME
TOPICAL | Status: DC | PRN
  Administered 2015-05-18: 1000 mL

## 2015-05-18 MED ORDER — FENTANYL CITRATE (PF) 100 MCG/2ML IJ SOLN
INTRAMUSCULAR | Status: AC
  Filled 2015-05-18: qty 4

## 2015-05-18 MED ORDER — CEFAZOLIN SODIUM-DEXTROSE 2-3 GM-% IV SOLR
INTRAVENOUS | Status: AC
  Filled 2015-05-18: qty 50

## 2015-05-18 MED ORDER — FENTANYL CITRATE (PF) 100 MCG/2ML IJ SOLN
INTRAMUSCULAR | Status: DC | PRN
  Administered 2015-05-18: 50 ug via INTRAVENOUS

## 2015-05-18 MED ORDER — HYDROMORPHONE HCL 1 MG/ML IJ SOLN: 0.2500 mg | INTRAMUSCULAR | Status: DC | PRN

## 2015-05-18 MED ORDER — SCOPOLAMINE 1 MG/3DAYS TD PT72: 1.0000 | MEDICATED_PATCH | Freq: Once | TRANSDERMAL | Status: DC | PRN

## 2015-05-18 MED ORDER — CEFAZOLIN SODIUM-DEXTROSE 2-3 GM-% IV SOLR
2.0000 g | INTRAVENOUS | Status: AC
  Administered 2015-05-18: 2 g via INTRAVENOUS

## 2015-05-18 MED ORDER — BUPIVACAINE-EPINEPHRINE 0.5% -1:200000 IJ SOLN
INTRAMUSCULAR | Status: DC | PRN
  Administered 2015-05-18: 2.5 mL

## 2015-05-18 MED ORDER — ONDANSETRON HCL 4 MG/2ML IJ SOLN
INTRAMUSCULAR | Status: DC | PRN
  Administered 2015-05-18: 4 mg via INTRAVENOUS

## 2015-05-18 SURGICAL SUPPLY — 42 items
BLADE MINI RND TIP GREEN BEAV (BLADE) IMPLANT
BLADE SURG 15 STRL LF DISP TIS (BLADE) ×1 IMPLANT
BLADE SURG 15 STRL SS (BLADE) ×2
BNDG CMPR 9X4 STRL LF SNTH (GAUZE/BANDAGES/DRESSINGS) ×1
BNDG COHESIVE 1X5 TAN STRL LF (GAUZE/BANDAGES/DRESSINGS) ×1 IMPLANT
BNDG CONFORM 2 STRL LF (GAUZE/BANDAGES/DRESSINGS) ×2 IMPLANT
BNDG ESMARK 4X9 LF (GAUZE/BANDAGES/DRESSINGS) ×2 IMPLANT
CHLORAPREP W/TINT 26ML (MISCELLANEOUS) ×2 IMPLANT
COVER BACK TABLE 60X90IN (DRAPES) ×2 IMPLANT
COVER MAYO STAND STRL (DRAPES) ×2 IMPLANT
CUFF TOURNIQUET SINGLE 18IN (TOURNIQUET CUFF) IMPLANT
DRAPE EXTREMITY T 121X128X90 (DRAPE) ×2 IMPLANT
DRAPE SURG 17X23 STRL (DRAPES) ×2 IMPLANT
DRSG EMULSION OIL 3X3 NADH (GAUZE/BANDAGES/DRESSINGS) ×2 IMPLANT
GLOVE BIO SURGEON STRL SZ7.5 (GLOVE) ×2 IMPLANT
GLOVE BIOGEL PI IND STRL 7.0 (GLOVE) ×1 IMPLANT
GLOVE BIOGEL PI IND STRL 8 (GLOVE) ×1 IMPLANT
GLOVE BIOGEL PI INDICATOR 7.0 (GLOVE) ×1
GLOVE BIOGEL PI INDICATOR 8 (GLOVE) ×1
GLOVE ECLIPSE 6.5 STRL STRAW (GLOVE) ×2 IMPLANT
GOWN STRL REUS W/ TWL LRG LVL3 (GOWN DISPOSABLE) ×2 IMPLANT
GOWN STRL REUS W/TWL LRG LVL3 (GOWN DISPOSABLE) ×4
GOWN STRL REUS W/TWL XL LVL3 (GOWN DISPOSABLE) ×2 IMPLANT
NDL HYPO 25X1 1.5 SAFETY (NEEDLE) IMPLANT
NDL SAFETY ECLIPSE 18X1.5 (NEEDLE) IMPLANT
NEEDLE HYPO 18GX1.5 SHARP (NEEDLE)
NEEDLE HYPO 25X1 1.5 SAFETY (NEEDLE) ×2 IMPLANT
NS IRRIG 1000ML POUR BTL (IV SOLUTION) ×2 IMPLANT
PACK BASIN DAY SURGERY FS (CUSTOM PROCEDURE TRAY) ×2 IMPLANT
PADDING CAST ABS 4INX4YD NS (CAST SUPPLIES)
PADDING CAST ABS COTTON 4X4 ST (CAST SUPPLIES) IMPLANT
PADDING CAST COTTON 2X4 NS (CAST SUPPLIES) IMPLANT
SPONGE GAUZE 4X4 12PLY STER LF (GAUZE/BANDAGES/DRESSINGS) ×2 IMPLANT
STOCKINETTE 6  STRL (DRAPES) ×1
STOCKINETTE 6 STRL (DRAPES) ×1 IMPLANT
SUT VICRYL RAPIDE 4-0 (SUTURE) IMPLANT
SUT VICRYL RAPIDE 4/0 PS 2 (SUTURE) IMPLANT
SYR BULB 3OZ (MISCELLANEOUS) ×2 IMPLANT
SYRINGE 10CC LL (SYRINGE) ×1 IMPLANT
TOWEL OR 17X24 6PK STRL BLUE (TOWEL DISPOSABLE) ×2 IMPLANT
TOWEL OR NON WOVEN STRL DISP B (DISPOSABLE) IMPLANT
UNDERPAD 30X30 (UNDERPADS AND DIAPERS) ×2 IMPLANT

## 2015-05-18 NOTE — Anesthesia Procedure Notes (Signed)
Procedure Name: MAC Date/Time: 05/18/2015 11:39 AM Performed by: Adrick Kestler D Pre-anesthesia Checklist: Patient identified, Emergency Drugs available, Suction available, Patient being monitored and Timeout performed Patient Re-evaluated:Patient Re-evaluated prior to inductionOxygen Delivery Method: Simple face mask

## 2015-05-18 NOTE — Anesthesia Preprocedure Evaluation (Addendum)
Anesthesia Evaluation  Patient identified by MRN, date of birth, ID band Patient awake    Reviewed: Allergy & Precautions, NPO status   Airway Mallampati: II   Neck ROM: Full    Dental  (+) Teeth Intact, Dental Advisory Given   Pulmonary  breath sounds clear to auscultation        Cardiovascular hypertension, Pt. on medications Rhythm:Regular     Neuro/Psych PSYCHIATRIC DISORDERS Depression    GI/Hepatic   Endo/Other    Renal/GU      Musculoskeletal   Abdominal   Peds  Hematology   Anesthesia Other Findings   Reproductive/Obstetrics                            Anesthesia Physical Anesthesia Plan  ASA: II  Anesthesia Plan: General   Post-op Pain Management:    Induction: Intravenous  Airway Management Planned: LMA  Additional Equipment:   Intra-op Plan:   Post-operative Plan: Extubation in OR  Informed Consent: I have reviewed the patients History and Physical, chart, labs and discussed the procedure including the risks, benefits and alternatives for the proposed anesthesia with the patient or authorized representative who has indicated his/her understanding and acceptance.   Dental advisory given  Plan Discussed with: CRNA and Anesthesiologist  Anesthesia Plan Comments:         Anesthesia Quick Evaluation

## 2015-05-18 NOTE — Discharge Instructions (Signed)
Discharge Instructions ° ° °You have a light dressing on your hand.  °You may begin gentle motion of your fingers and hand immediately, but you should not do any heavy lifting or gripping.  Elevate your hand to reduce pain & swelling of the digits.  Ice over the operative site may be helpful to reduce pain & swelling.  DO NOT USE HEAT. °Pain medicine has been prescribed for you.  °Use your medicine as needed over the first 48 hours, and then you can begin to taper your use. You may use Tylenol in place of your prescribed pain medication, but not IN ADDITION to it. °Leave the dressing in place until the third day after your surgery and then remove it, leaving it open to air.  °After the bandage has been removed you may shower, regularly washing the incision and letting the water run over it, but not submerging it (no swimming, soaking it in dishwater, etc.) °You may drive a car when you are off of prescription pain medications and can safely control your vehicle with both hands. °We will address whether therapy will be required or not when you return to the office. °You may have already made your follow-up appointment when we completed your preop visit.  If not, please call our office today or the next business day to make your return appointment for 10-15 days after surgery. ° ° °Please call [phone] during normal business hours or [phone] after hours for any problems. Including the following: ° °- excessive redness of the incisions °- drainage for more than 4 days °- fever of more than 101.5 F ° °*Please note that pain medications will not be refilled after hours or on weekends. °Post Anesthesia Home Care Instructions ° °Activity: °Get plenty of rest for the remainder of the day. A responsible adult should stay with you for 24 hours following the procedure.  °For the next 24 hours, DO NOT: °-Drive a car °-Operate machinery °-Drink alcoholic beverages °-Take any medication unless instructed by your  physician °-Make any legal decisions or sign important papers. ° °Meals: °Start with liquid foods such as gelatin or soup. Progress to regular foods as tolerated. Avoid greasy, spicy, heavy foods. If nausea and/or vomiting occur, drink only clear liquids until the nausea and/or vomiting subsides. Call your physician if vomiting continues. ° °Special Instructions/Symptoms: °Your throat may feel dry or sore from the anesthesia or the breathing tube placed in your throat during surgery. If this causes discomfort, gargle with warm salt water. The discomfort should disappear within 24 hours. ° °If you had a scopolamine patch placed behind your ear for the management of post- operative nausea and/or vomiting: ° °1. The medication in the patch is effective for 72 hours, after which it should be removed.  Wrap patch in a tissue and discard in the trash. Wash hands thoroughly with soap and water. °2. You may remove the patch earlier than 72 hours if you experience unpleasant side effects which may include dry mouth, dizziness or visual disturbances. °3. Avoid touching the patch. Wash your hands with soap and water after contact with the patch. °  ° °

## 2015-05-18 NOTE — Anesthesia Postprocedure Evaluation (Signed)
  Anesthesia Post-op Note  Patient: Rebecca Steele  Procedure(s) Performed: Procedure(s): RIGHT  LONG FINGER TRIGGER RELEASE  (Right)  Patient Location: PACU  Anesthesia Type:MAC  Level of Consciousness: awake, alert  and oriented  Airway and Oxygen Therapy: Patient Spontanous Breathing  Post-op Pain: none  Post-op Assessment: Post-op Vital signs reviewed, Patient's Cardiovascular Status Stable, Respiratory Function Stable, Patent Airway and Pain level controlled              Post-op Vital Signs: stable  Last Vitals:  Filed Vitals:   05/18/15 1300  BP: 133/72  Pulse: 69  Temp: 36.9 C  Resp: 20    Complications: No apparent anesthesia complications

## 2015-05-18 NOTE — Interval H&P Note (Signed)
History and Physical Interval Note:  05/18/2015 10:11 AM  Rebecca Steele  has presented today for surgery, with the diagnosis of RIGHT  LONG FINGER TRIGGER   The various methods of treatment have been discussed with the patient and family. After consideration of risks, benefits and other options for treatment, the patient has consented to  Procedure(s): RIGHT  LONG FINGER TRIGGER RELEASE  (Right) as a surgical intervention .  The patient's history has been reviewed, patient examined, no change in status, stable for surgery.  I have reviewed the patient's chart and labs.  Questions were answered to the patient's satisfaction.     Ritu Gagliardo A.

## 2015-05-18 NOTE — Transfer of Care (Signed)
Immediate Anesthesia Transfer of Care Note  Patient: Rebecca Steele  Procedure(s) Performed: Procedure(s): RIGHT  LONG FINGER TRIGGER RELEASE  (Right)  Patient Location: PACU  Anesthesia Type:MAC  Level of Consciousness: awake, alert , oriented and patient cooperative  Airway & Oxygen Therapy: Patient Spontanous Breathing and Patient connected to face mask oxygen  Post-op Assessment: Report given to RN  Post vital signs: Reviewed and stable  Last Vitals:  Filed Vitals:   05/18/15 0922  BP: 116/79  Pulse: 64  Temp: 36.8 C  Resp: 20    Complications: No apparent anesthesia complications

## 2015-05-18 NOTE — Op Note (Signed)
05/18/2015  10:12 AM  PATIENT:  Rebecca Steele  64 y.o. female  PRE-OPERATIVE DIAGNOSIS:  Right long finger trigger digit  POST-OPERATIVE DIAGNOSIS:  Same  PROCEDURE:  Right long finger trigger release  SURGEON: Rayvon Char. Grandville Silos, MD  PHYSICIAN ASSISTANT: None  ANESTHESIA:  local and MAC  SPECIMENS:  None  DRAINS:   None  EBL:  less than 50 mL  PREOPERATIVE INDICATIONS:  [name] is a  64 y.o. female with right long finger trigger digit that responded transiently to nonoperative management.  The risks benefits and alternatives were discussed with the patient preoperatively including but not limited to the risks of infection, bleeding, nerve injury, cardiopulmonary complications, the need for revision surgery, among others, and the patient verbalized understanding and consented to proceed.  OPERATIVE IMPLANTS: None  OPERATIVE PROCEDURE:  After receiving prophylactic antibiotics, the patient was escorted to the operative theatre and placed in a supine position.  A surgical "time-out" was performed during which the planned procedure, proposed operative site, and the correct patient identity were compared to the operative consent and agreement confirmed by the circulating nurse according to current facility policy. A digital block was performed with a mixture of lidocaine and Marcaine bearing epinephrine. Following application of a tourniquet to the operative extremity, the exposed skin was prepped with Chloraprep and draped in the usual sterile fashion.  The limb was exsanguinated with an Esmarch bandage and the tourniquet inflated to approximately 163mmHg higher than systolic BP.  An oblique incision was made over the A1 pulley of the long finger paralleling the distal wrist palmar crease. Subcutaneous tissues were dissected with blunt and spreading dissection. The A1 pulley was identified and split longitudinally in the midline. There was some additional crossing bands  proximal this that was split as well. The tendon was pulled into view, found to have thickened tenosynovium underlying the region encircled by the A1 pulley. This was debrided. The tendon was returned to its bed, the wound irrigated, tourniquet released. Additional hemostasis wasn't necessary and the skin was closed with 4-0 Vicryl Rapide interrupted sutures. A light dressing was applied and she was taken to the recovery room.  DISPOSITION: She'll be discharged home today with typical instructions, returning in 10-15 days.

## 2015-05-19 ENCOUNTER — Encounter (HOSPITAL_BASED_OUTPATIENT_CLINIC_OR_DEPARTMENT_OTHER): Payer: Self-pay | Admitting: Orthopedic Surgery

## 2016-08-08 DIAGNOSIS — E559 Vitamin D deficiency, unspecified: Secondary | ICD-10-CM | POA: Diagnosis not present

## 2016-08-08 DIAGNOSIS — Z0000 Encounter for general adult medical examination without abnormal findings: Secondary | ICD-10-CM | POA: Diagnosis not present

## 2016-08-08 DIAGNOSIS — I10 Essential (primary) hypertension: Secondary | ICD-10-CM | POA: Diagnosis not present

## 2016-08-08 DIAGNOSIS — D751 Secondary polycythemia: Secondary | ICD-10-CM | POA: Diagnosis not present

## 2016-08-08 DIAGNOSIS — Z79899 Other long term (current) drug therapy: Secondary | ICD-10-CM | POA: Diagnosis not present

## 2016-08-08 DIAGNOSIS — G4700 Insomnia, unspecified: Secondary | ICD-10-CM | POA: Diagnosis not present

## 2016-08-08 DIAGNOSIS — R0602 Shortness of breath: Secondary | ICD-10-CM | POA: Diagnosis not present

## 2016-08-09 DIAGNOSIS — R69 Illness, unspecified: Secondary | ICD-10-CM | POA: Diagnosis not present

## 2016-08-24 DIAGNOSIS — G5762 Lesion of plantar nerve, left lower limb: Secondary | ICD-10-CM | POA: Diagnosis not present

## 2016-08-24 DIAGNOSIS — G5761 Lesion of plantar nerve, right lower limb: Secondary | ICD-10-CM | POA: Diagnosis not present

## 2016-08-24 DIAGNOSIS — G5763 Lesion of plantar nerve, bilateral lower limbs: Secondary | ICD-10-CM | POA: Diagnosis not present

## 2016-08-24 DIAGNOSIS — E7800 Pure hypercholesterolemia, unspecified: Secondary | ICD-10-CM | POA: Diagnosis not present

## 2016-09-28 DIAGNOSIS — R05 Cough: Secondary | ICD-10-CM | POA: Diagnosis not present

## 2016-09-28 DIAGNOSIS — R69 Illness, unspecified: Secondary | ICD-10-CM | POA: Diagnosis not present

## 2016-09-28 DIAGNOSIS — I10 Essential (primary) hypertension: Secondary | ICD-10-CM | POA: Diagnosis not present

## 2016-09-28 DIAGNOSIS — J018 Other acute sinusitis: Secondary | ICD-10-CM | POA: Diagnosis not present

## 2016-10-03 HISTORY — PX: BREAST EXCISIONAL BIOPSY: SUR124

## 2016-10-11 DIAGNOSIS — R69 Illness, unspecified: Secondary | ICD-10-CM | POA: Diagnosis not present

## 2016-10-11 DIAGNOSIS — G2581 Restless legs syndrome: Secondary | ICD-10-CM | POA: Diagnosis not present

## 2016-10-11 DIAGNOSIS — G4733 Obstructive sleep apnea (adult) (pediatric): Secondary | ICD-10-CM | POA: Diagnosis not present

## 2016-10-11 DIAGNOSIS — I10 Essential (primary) hypertension: Secondary | ICD-10-CM | POA: Diagnosis not present

## 2016-10-26 DIAGNOSIS — H608X3 Other otitis externa, bilateral: Secondary | ICD-10-CM | POA: Diagnosis not present

## 2016-10-30 DIAGNOSIS — G4733 Obstructive sleep apnea (adult) (pediatric): Secondary | ICD-10-CM | POA: Diagnosis not present

## 2016-10-30 DIAGNOSIS — G4720 Circadian rhythm sleep disorder, unspecified type: Secondary | ICD-10-CM | POA: Diagnosis not present

## 2016-10-30 DIAGNOSIS — R0902 Hypoxemia: Secondary | ICD-10-CM | POA: Diagnosis not present

## 2016-11-03 DIAGNOSIS — J029 Acute pharyngitis, unspecified: Secondary | ICD-10-CM | POA: Diagnosis not present

## 2016-11-03 DIAGNOSIS — I10 Essential (primary) hypertension: Secondary | ICD-10-CM | POA: Diagnosis not present

## 2016-11-03 DIAGNOSIS — R69 Illness, unspecified: Secondary | ICD-10-CM | POA: Diagnosis not present

## 2016-11-03 DIAGNOSIS — J018 Other acute sinusitis: Secondary | ICD-10-CM | POA: Diagnosis not present

## 2016-11-08 DIAGNOSIS — I10 Essential (primary) hypertension: Secondary | ICD-10-CM | POA: Diagnosis not present

## 2016-11-08 DIAGNOSIS — G4733 Obstructive sleep apnea (adult) (pediatric): Secondary | ICD-10-CM | POA: Diagnosis not present

## 2016-11-08 DIAGNOSIS — E782 Mixed hyperlipidemia: Secondary | ICD-10-CM | POA: Diagnosis not present

## 2016-11-08 DIAGNOSIS — G2581 Restless legs syndrome: Secondary | ICD-10-CM | POA: Diagnosis not present

## 2016-11-22 DIAGNOSIS — R0602 Shortness of breath: Secondary | ICD-10-CM | POA: Diagnosis not present

## 2016-11-22 DIAGNOSIS — J209 Acute bronchitis, unspecified: Secondary | ICD-10-CM | POA: Diagnosis not present

## 2016-11-22 DIAGNOSIS — H6641 Suppurative otitis media, unspecified, right ear: Secondary | ICD-10-CM | POA: Diagnosis not present

## 2016-12-08 DIAGNOSIS — I10 Essential (primary) hypertension: Secondary | ICD-10-CM | POA: Diagnosis not present

## 2016-12-08 DIAGNOSIS — G4730 Sleep apnea, unspecified: Secondary | ICD-10-CM | POA: Diagnosis not present

## 2016-12-08 DIAGNOSIS — G4701 Insomnia due to medical condition: Secondary | ICD-10-CM | POA: Diagnosis not present

## 2016-12-08 DIAGNOSIS — M542 Cervicalgia: Secondary | ICD-10-CM | POA: Diagnosis not present

## 2016-12-08 DIAGNOSIS — Z0000 Encounter for general adult medical examination without abnormal findings: Secondary | ICD-10-CM | POA: Diagnosis not present

## 2016-12-08 DIAGNOSIS — Z6828 Body mass index (BMI) 28.0-28.9, adult: Secondary | ICD-10-CM | POA: Diagnosis not present

## 2016-12-08 DIAGNOSIS — R69 Illness, unspecified: Secondary | ICD-10-CM | POA: Diagnosis not present

## 2016-12-08 DIAGNOSIS — J309 Allergic rhinitis, unspecified: Secondary | ICD-10-CM | POA: Diagnosis not present

## 2016-12-13 DIAGNOSIS — E7800 Pure hypercholesterolemia, unspecified: Secondary | ICD-10-CM | POA: Diagnosis not present

## 2016-12-13 DIAGNOSIS — R05 Cough: Secondary | ICD-10-CM | POA: Diagnosis not present

## 2016-12-13 DIAGNOSIS — R0602 Shortness of breath: Secondary | ICD-10-CM | POA: Diagnosis not present

## 2016-12-13 DIAGNOSIS — J209 Acute bronchitis, unspecified: Secondary | ICD-10-CM | POA: Diagnosis not present

## 2016-12-13 DIAGNOSIS — D539 Nutritional anemia, unspecified: Secondary | ICD-10-CM | POA: Diagnosis not present

## 2016-12-13 DIAGNOSIS — I517 Cardiomegaly: Secondary | ICD-10-CM | POA: Diagnosis not present

## 2016-12-13 DIAGNOSIS — Z79899 Other long term (current) drug therapy: Secondary | ICD-10-CM | POA: Diagnosis not present

## 2016-12-13 DIAGNOSIS — J029 Acute pharyngitis, unspecified: Secondary | ICD-10-CM | POA: Diagnosis not present

## 2016-12-14 DIAGNOSIS — I10 Essential (primary) hypertension: Secondary | ICD-10-CM | POA: Diagnosis not present

## 2016-12-14 DIAGNOSIS — G4733 Obstructive sleep apnea (adult) (pediatric): Secondary | ICD-10-CM | POA: Diagnosis not present

## 2016-12-14 DIAGNOSIS — R69 Illness, unspecified: Secondary | ICD-10-CM | POA: Diagnosis not present

## 2016-12-14 DIAGNOSIS — R0602 Shortness of breath: Secondary | ICD-10-CM | POA: Diagnosis not present

## 2016-12-14 DIAGNOSIS — R942 Abnormal results of pulmonary function studies: Secondary | ICD-10-CM | POA: Diagnosis not present

## 2016-12-16 DIAGNOSIS — R29810 Facial weakness: Secondary | ICD-10-CM | POA: Diagnosis not present

## 2016-12-16 DIAGNOSIS — R942 Abnormal results of pulmonary function studies: Secondary | ICD-10-CM | POA: Diagnosis not present

## 2016-12-16 DIAGNOSIS — R0989 Other specified symptoms and signs involving the circulatory and respiratory systems: Secondary | ICD-10-CM | POA: Diagnosis not present

## 2016-12-16 DIAGNOSIS — I517 Cardiomegaly: Secondary | ICD-10-CM | POA: Diagnosis not present

## 2016-12-16 DIAGNOSIS — M79606 Pain in leg, unspecified: Secondary | ICD-10-CM | POA: Diagnosis not present

## 2016-12-28 DIAGNOSIS — G4733 Obstructive sleep apnea (adult) (pediatric): Secondary | ICD-10-CM | POA: Diagnosis not present

## 2016-12-28 DIAGNOSIS — R05 Cough: Secondary | ICD-10-CM | POA: Diagnosis not present

## 2017-02-03 DIAGNOSIS — L821 Other seborrheic keratosis: Secondary | ICD-10-CM | POA: Diagnosis not present

## 2017-02-03 DIAGNOSIS — Z809 Family history of malignant neoplasm, unspecified: Secondary | ICD-10-CM | POA: Diagnosis not present

## 2017-02-03 DIAGNOSIS — D3610 Benign neoplasm of peripheral nerves and autonomic nervous system, unspecified: Secondary | ICD-10-CM | POA: Diagnosis not present

## 2017-02-03 DIAGNOSIS — L570 Actinic keratosis: Secondary | ICD-10-CM | POA: Diagnosis not present

## 2017-02-03 DIAGNOSIS — L578 Other skin changes due to chronic exposure to nonionizing radiation: Secondary | ICD-10-CM | POA: Diagnosis not present

## 2017-02-03 DIAGNOSIS — D485 Neoplasm of uncertain behavior of skin: Secondary | ICD-10-CM | POA: Diagnosis not present

## 2017-02-03 DIAGNOSIS — D235 Other benign neoplasm of skin of trunk: Secondary | ICD-10-CM | POA: Diagnosis not present

## 2017-02-09 DIAGNOSIS — R69 Illness, unspecified: Secondary | ICD-10-CM | POA: Diagnosis not present

## 2017-03-07 DIAGNOSIS — Z23 Encounter for immunization: Secondary | ICD-10-CM | POA: Diagnosis not present

## 2017-03-07 DIAGNOSIS — Z6827 Body mass index (BMI) 27.0-27.9, adult: Secondary | ICD-10-CM | POA: Diagnosis not present

## 2017-03-07 DIAGNOSIS — I10 Essential (primary) hypertension: Secondary | ICD-10-CM | POA: Diagnosis not present

## 2017-03-07 DIAGNOSIS — R69 Illness, unspecified: Secondary | ICD-10-CM | POA: Diagnosis not present

## 2017-03-07 DIAGNOSIS — G43009 Migraine without aura, not intractable, without status migrainosus: Secondary | ICD-10-CM | POA: Diagnosis not present

## 2017-05-12 DIAGNOSIS — Z6827 Body mass index (BMI) 27.0-27.9, adult: Secondary | ICD-10-CM | POA: Diagnosis not present

## 2017-05-12 DIAGNOSIS — J069 Acute upper respiratory infection, unspecified: Secondary | ICD-10-CM | POA: Diagnosis not present

## 2017-05-12 DIAGNOSIS — J329 Chronic sinusitis, unspecified: Secondary | ICD-10-CM | POA: Diagnosis not present

## 2017-05-27 DIAGNOSIS — A499 Bacterial infection, unspecified: Secondary | ICD-10-CM | POA: Diagnosis not present

## 2017-05-27 DIAGNOSIS — R300 Dysuria: Secondary | ICD-10-CM | POA: Diagnosis not present

## 2017-05-27 DIAGNOSIS — N390 Urinary tract infection, site not specified: Secondary | ICD-10-CM | POA: Diagnosis not present

## 2017-06-27 DIAGNOSIS — Z23 Encounter for immunization: Secondary | ICD-10-CM | POA: Diagnosis not present

## 2017-08-11 DIAGNOSIS — M549 Dorsalgia, unspecified: Secondary | ICD-10-CM | POA: Diagnosis not present

## 2017-08-11 DIAGNOSIS — N12 Tubulo-interstitial nephritis, not specified as acute or chronic: Secondary | ICD-10-CM | POA: Diagnosis not present

## 2017-08-11 DIAGNOSIS — N390 Urinary tract infection, site not specified: Secondary | ICD-10-CM | POA: Diagnosis not present

## 2017-08-15 ENCOUNTER — Other Ambulatory Visit: Payer: Self-pay | Admitting: Family Medicine

## 2017-08-15 ENCOUNTER — Ambulatory Visit
Admission: RE | Admit: 2017-08-15 | Discharge: 2017-08-15 | Disposition: A | Discharge status: Home / Self Care | Payer: BLUE CROSS/BLUE SHIELD | Source: Ambulatory Visit | Attending: Family Medicine | Admitting: Family Medicine

## 2017-08-15 DIAGNOSIS — R1084 Generalized abdominal pain: Secondary | ICD-10-CM

## 2017-08-15 DIAGNOSIS — N12 Tubulo-interstitial nephritis, not specified as acute or chronic: Secondary | ICD-10-CM | POA: Diagnosis not present

## 2017-08-15 DIAGNOSIS — M549 Dorsalgia, unspecified: Secondary | ICD-10-CM | POA: Diagnosis not present

## 2017-08-15 DIAGNOSIS — Z6827 Body mass index (BMI) 27.0-27.9, adult: Secondary | ICD-10-CM | POA: Diagnosis not present

## 2017-08-15 DIAGNOSIS — R52 Pain, unspecified: Secondary | ICD-10-CM

## 2017-08-15 DIAGNOSIS — R0781 Pleurodynia: Secondary | ICD-10-CM | POA: Diagnosis not present

## 2017-08-15 DIAGNOSIS — R319 Hematuria, unspecified: Secondary | ICD-10-CM | POA: Diagnosis not present

## 2017-08-21 DIAGNOSIS — Z6826 Body mass index (BMI) 26.0-26.9, adult: Secondary | ICD-10-CM | POA: Diagnosis not present

## 2017-08-21 DIAGNOSIS — N12 Tubulo-interstitial nephritis, not specified as acute or chronic: Secondary | ICD-10-CM | POA: Diagnosis not present

## 2017-08-31 DIAGNOSIS — Z0000 Encounter for general adult medical examination without abnormal findings: Secondary | ICD-10-CM | POA: Diagnosis not present

## 2017-08-31 DIAGNOSIS — R946 Abnormal results of thyroid function studies: Secondary | ICD-10-CM | POA: Diagnosis not present

## 2017-09-04 DIAGNOSIS — Z0000 Encounter for general adult medical examination without abnormal findings: Secondary | ICD-10-CM | POA: Diagnosis not present

## 2017-09-04 DIAGNOSIS — E876 Hypokalemia: Secondary | ICD-10-CM | POA: Diagnosis not present

## 2017-09-04 DIAGNOSIS — R319 Hematuria, unspecified: Secondary | ICD-10-CM | POA: Diagnosis not present

## 2017-09-04 DIAGNOSIS — I10 Essential (primary) hypertension: Secondary | ICD-10-CM | POA: Diagnosis not present

## 2017-09-08 DIAGNOSIS — R350 Frequency of micturition: Secondary | ICD-10-CM | POA: Diagnosis not present

## 2017-09-08 DIAGNOSIS — N393 Stress incontinence (female) (male): Secondary | ICD-10-CM | POA: Diagnosis not present

## 2017-09-08 DIAGNOSIS — R3121 Asymptomatic microscopic hematuria: Secondary | ICD-10-CM | POA: Diagnosis not present

## 2017-09-08 DIAGNOSIS — R351 Nocturia: Secondary | ICD-10-CM | POA: Diagnosis not present

## 2017-09-28 DIAGNOSIS — R946 Abnormal results of thyroid function studies: Secondary | ICD-10-CM | POA: Diagnosis not present

## 2017-09-28 DIAGNOSIS — E782 Mixed hyperlipidemia: Secondary | ICD-10-CM | POA: Diagnosis not present

## 2017-09-28 DIAGNOSIS — I10 Essential (primary) hypertension: Secondary | ICD-10-CM | POA: Diagnosis not present

## 2017-10-02 DIAGNOSIS — E782 Mixed hyperlipidemia: Secondary | ICD-10-CM | POA: Diagnosis not present

## 2017-10-02 DIAGNOSIS — E039 Hypothyroidism, unspecified: Secondary | ICD-10-CM | POA: Diagnosis not present

## 2017-10-02 DIAGNOSIS — I10 Essential (primary) hypertension: Secondary | ICD-10-CM | POA: Diagnosis not present

## 2017-10-02 DIAGNOSIS — Z6827 Body mass index (BMI) 27.0-27.9, adult: Secondary | ICD-10-CM | POA: Diagnosis not present

## 2017-10-02 DIAGNOSIS — R3129 Other microscopic hematuria: Secondary | ICD-10-CM | POA: Diagnosis not present

## 2017-10-02 DIAGNOSIS — R3121 Asymptomatic microscopic hematuria: Secondary | ICD-10-CM | POA: Diagnosis not present

## 2017-10-06 DIAGNOSIS — R3121 Asymptomatic microscopic hematuria: Secondary | ICD-10-CM | POA: Diagnosis not present

## 2017-10-06 DIAGNOSIS — N393 Stress incontinence (female) (male): Secondary | ICD-10-CM | POA: Diagnosis not present

## 2017-10-09 ENCOUNTER — Other Ambulatory Visit: Payer: Self-pay | Admitting: Family Medicine

## 2017-10-09 DIAGNOSIS — N8500 Endometrial hyperplasia, unspecified: Secondary | ICD-10-CM

## 2017-10-10 ENCOUNTER — Ambulatory Visit
Admission: RE | Admit: 2017-10-10 | Discharge: 2017-10-10 | Disposition: A | Discharge status: Home / Self Care | Payer: Medicare HMO | Source: Ambulatory Visit | Attending: Family Medicine | Admitting: Family Medicine

## 2017-10-10 DIAGNOSIS — N8500 Endometrial hyperplasia, unspecified: Secondary | ICD-10-CM | POA: Diagnosis not present

## 2017-10-11 DIAGNOSIS — Z6827 Body mass index (BMI) 27.0-27.9, adult: Secondary | ICD-10-CM | POA: Diagnosis not present

## 2017-10-11 DIAGNOSIS — N814 Uterovaginal prolapse, unspecified: Secondary | ICD-10-CM | POA: Diagnosis not present

## 2017-10-11 DIAGNOSIS — N8500 Endometrial hyperplasia, unspecified: Secondary | ICD-10-CM | POA: Diagnosis not present

## 2017-10-11 DIAGNOSIS — N8110 Cystocele, unspecified: Secondary | ICD-10-CM | POA: Diagnosis not present

## 2017-10-16 DIAGNOSIS — R69 Illness, unspecified: Secondary | ICD-10-CM | POA: Diagnosis not present

## 2017-10-17 DIAGNOSIS — N8182 Incompetence or weakening of pubocervical tissue: Secondary | ICD-10-CM | POA: Diagnosis not present

## 2017-10-17 DIAGNOSIS — R32 Unspecified urinary incontinence: Secondary | ICD-10-CM | POA: Diagnosis not present

## 2017-10-17 DIAGNOSIS — R9389 Abnormal findings on diagnostic imaging of other specified body structures: Secondary | ICD-10-CM | POA: Diagnosis not present

## 2017-10-24 ENCOUNTER — Other Ambulatory Visit: Payer: Self-pay | Admitting: Family Medicine

## 2017-10-24 DIAGNOSIS — Z139 Encounter for screening, unspecified: Secondary | ICD-10-CM

## 2017-10-26 DIAGNOSIS — N8500 Endometrial hyperplasia, unspecified: Secondary | ICD-10-CM | POA: Diagnosis not present

## 2017-11-08 DIAGNOSIS — N8500 Endometrial hyperplasia, unspecified: Secondary | ICD-10-CM | POA: Diagnosis not present

## 2017-11-08 DIAGNOSIS — N840 Polyp of corpus uteri: Secondary | ICD-10-CM | POA: Diagnosis not present

## 2017-11-08 DIAGNOSIS — N858 Other specified noninflammatory disorders of uterus: Secondary | ICD-10-CM | POA: Diagnosis not present

## 2017-11-17 ENCOUNTER — Ambulatory Visit
Admission: RE | Admit: 2017-11-17 | Discharge: 2017-11-17 | Disposition: A | Discharge status: Home / Self Care | Payer: Medicare HMO | Source: Ambulatory Visit | Attending: Family Medicine | Admitting: Family Medicine

## 2017-11-17 DIAGNOSIS — Z1231 Encounter for screening mammogram for malignant neoplasm of breast: Secondary | ICD-10-CM | POA: Diagnosis not present

## 2017-11-17 DIAGNOSIS — Z139 Encounter for screening, unspecified: Secondary | ICD-10-CM

## 2017-11-20 DIAGNOSIS — E039 Hypothyroidism, unspecified: Secondary | ICD-10-CM | POA: Diagnosis not present

## 2017-11-20 DIAGNOSIS — I10 Essential (primary) hypertension: Secondary | ICD-10-CM | POA: Diagnosis not present

## 2017-11-20 DIAGNOSIS — E782 Mixed hyperlipidemia: Secondary | ICD-10-CM | POA: Diagnosis not present

## 2017-11-21 ENCOUNTER — Other Ambulatory Visit: Payer: Self-pay | Admitting: Family Medicine

## 2017-11-21 DIAGNOSIS — R928 Other abnormal and inconclusive findings on diagnostic imaging of breast: Secondary | ICD-10-CM

## 2017-11-24 ENCOUNTER — Other Ambulatory Visit: Payer: Self-pay | Admitting: Family Medicine

## 2017-11-24 ENCOUNTER — Ambulatory Visit
Admission: RE | Admit: 2017-11-24 | Discharge: 2017-11-24 | Disposition: A | Discharge status: Home / Self Care | Payer: Medicare HMO | Source: Ambulatory Visit | Attending: Family Medicine | Admitting: Family Medicine

## 2017-11-24 DIAGNOSIS — N6489 Other specified disorders of breast: Secondary | ICD-10-CM | POA: Diagnosis not present

## 2017-11-24 DIAGNOSIS — R922 Inconclusive mammogram: Secondary | ICD-10-CM | POA: Diagnosis not present

## 2017-11-24 DIAGNOSIS — R928 Other abnormal and inconclusive findings on diagnostic imaging of breast: Secondary | ICD-10-CM

## 2017-11-24 DIAGNOSIS — Z6827 Body mass index (BMI) 27.0-27.9, adult: Secondary | ICD-10-CM | POA: Diagnosis not present

## 2017-11-24 DIAGNOSIS — I10 Essential (primary) hypertension: Secondary | ICD-10-CM | POA: Diagnosis not present

## 2017-11-24 DIAGNOSIS — E039 Hypothyroidism, unspecified: Secondary | ICD-10-CM | POA: Diagnosis not present

## 2017-11-24 DIAGNOSIS — E782 Mixed hyperlipidemia: Secondary | ICD-10-CM | POA: Diagnosis not present

## 2017-11-28 ENCOUNTER — Other Ambulatory Visit: Payer: Self-pay | Admitting: Family Medicine

## 2017-11-28 ENCOUNTER — Ambulatory Visit
Admission: RE | Admit: 2017-11-28 | Discharge: 2017-11-28 | Disposition: A | Discharge status: Home / Self Care | Payer: Medicare HMO | Source: Ambulatory Visit | Attending: Family Medicine | Admitting: Family Medicine

## 2017-11-28 DIAGNOSIS — N6489 Other specified disorders of breast: Secondary | ICD-10-CM | POA: Diagnosis not present

## 2017-11-28 DIAGNOSIS — R928 Other abnormal and inconclusive findings on diagnostic imaging of breast: Secondary | ICD-10-CM

## 2017-11-28 DIAGNOSIS — N6312 Unspecified lump in the right breast, upper inner quadrant: Secondary | ICD-10-CM | POA: Diagnosis not present

## 2017-12-01 ENCOUNTER — Other Ambulatory Visit: Payer: Self-pay | Admitting: General Surgery

## 2017-12-01 DIAGNOSIS — N6489 Other specified disorders of breast: Secondary | ICD-10-CM | POA: Diagnosis not present

## 2017-12-04 ENCOUNTER — Other Ambulatory Visit: Payer: Self-pay

## 2017-12-04 ENCOUNTER — Encounter (HOSPITAL_BASED_OUTPATIENT_CLINIC_OR_DEPARTMENT_OTHER): Payer: Self-pay | Admitting: *Deleted

## 2017-12-05 ENCOUNTER — Encounter (HOSPITAL_BASED_OUTPATIENT_CLINIC_OR_DEPARTMENT_OTHER)
Admission: RE | Admit: 2017-12-05 | Discharge: 2017-12-05 | Disposition: A | Discharge status: Home / Self Care | Payer: Medicare HMO | Source: Ambulatory Visit | Attending: General Surgery | Admitting: General Surgery

## 2017-12-05 DIAGNOSIS — R9431 Abnormal electrocardiogram [ECG] [EKG]: Secondary | ICD-10-CM | POA: Insufficient documentation

## 2017-12-05 DIAGNOSIS — I10 Essential (primary) hypertension: Secondary | ICD-10-CM | POA: Diagnosis not present

## 2017-12-05 DIAGNOSIS — Z01810 Encounter for preprocedural cardiovascular examination: Secondary | ICD-10-CM | POA: Diagnosis not present

## 2017-12-05 NOTE — Pre-Procedure Instructions (Signed)
Pt in for PAT, EKG done Ensure pre op drink given and instructions reviewed.

## 2017-12-07 ENCOUNTER — Ambulatory Visit
Admission: RE | Admit: 2017-12-07 | Discharge: 2017-12-07 | Disposition: A | Discharge status: Home / Self Care | Payer: Medicare HMO | Source: Ambulatory Visit | Attending: General Surgery | Admitting: General Surgery

## 2017-12-07 DIAGNOSIS — N6489 Other specified disorders of breast: Secondary | ICD-10-CM

## 2017-12-07 DIAGNOSIS — C50911 Malignant neoplasm of unspecified site of right female breast: Secondary | ICD-10-CM | POA: Diagnosis not present

## 2017-12-11 NOTE — Anesthesia Preprocedure Evaluation (Signed)
Anesthesia Evaluation  Patient identified by MRN, date of birth, ID band Patient awake    Reviewed: Allergy & Precautions, NPO status   Airway Mallampati: II   Neck ROM: Full    Dental  (+) Teeth Intact, Dental Advisory Given   Pulmonary sleep apnea ,    breath sounds clear to auscultation       Cardiovascular hypertension, Pt. on medications  Rhythm:Regular     Neuro/Psych PSYCHIATRIC DISORDERS Depression    GI/Hepatic   Endo/Other  Hypothyroidism   Renal/GU Renal disease     Musculoskeletal   Abdominal   Peds  Hematology   Anesthesia Other Findings   Reproductive/Obstetrics                             Anesthesia Physical  Anesthesia Plan  ASA: II  Anesthesia Plan: General   Post-op Pain Management:    Induction: Intravenous  PONV Risk Score and Plan: 3 and Ondansetron and Dexamethasone  Airway Management Planned: LMA  Additional Equipment:   Intra-op Plan:   Post-operative Plan: Extubation in OR  Informed Consent: I have reviewed the patients History and Physical, chart, labs and discussed the procedure including the risks, benefits and alternatives for the proposed anesthesia with the patient or authorized representative who has indicated his/her understanding and acceptance.   Dental advisory given  Plan Discussed with: CRNA  Anesthesia Plan Comments:         Anesthesia Quick Evaluation

## 2017-12-12 ENCOUNTER — Other Ambulatory Visit: Payer: Self-pay

## 2017-12-12 ENCOUNTER — Ambulatory Visit (HOSPITAL_COMMUNITY): Payer: Medicare HMO

## 2017-12-12 ENCOUNTER — Ambulatory Visit (HOSPITAL_BASED_OUTPATIENT_CLINIC_OR_DEPARTMENT_OTHER): Payer: Medicare HMO | Admitting: Anesthesiology

## 2017-12-12 ENCOUNTER — Other Ambulatory Visit: Payer: Self-pay | Admitting: General Surgery

## 2017-12-12 ENCOUNTER — Observation Stay (HOSPITAL_BASED_OUTPATIENT_CLINIC_OR_DEPARTMENT_OTHER)
Admission: RE | Admit: 2017-12-12 | Discharge: 2017-12-15 | Disposition: A | Discharge status: Home / Self Care | Payer: Medicare HMO | Source: Ambulatory Visit | Attending: General Surgery | Admitting: General Surgery

## 2017-12-12 ENCOUNTER — Emergency Department (HOSPITAL_COMMUNITY)
Admission: EM | Admit: 2017-12-12 | Discharge: 2017-12-12 | Disposition: A | Discharge status: Home / Self Care | Payer: Medicare HMO | Source: Home / Self Care

## 2017-12-12 ENCOUNTER — Encounter (HOSPITAL_COMMUNITY)
Admission: RE | Disposition: A | Discharge status: Home / Self Care | Payer: Self-pay | Source: Ambulatory Visit | Attending: Emergency Medicine

## 2017-12-12 ENCOUNTER — Ambulatory Visit
Admission: RE | Admit: 2017-12-12 | Discharge: 2017-12-12 | Disposition: A | Discharge status: Home / Self Care | Payer: Medicare HMO | Source: Ambulatory Visit | Attending: General Surgery | Admitting: General Surgery

## 2017-12-12 ENCOUNTER — Encounter (HOSPITAL_BASED_OUTPATIENT_CLINIC_OR_DEPARTMENT_OTHER): Payer: Self-pay | Admitting: *Deleted

## 2017-12-12 DIAGNOSIS — R921 Mammographic calcification found on diagnostic imaging of breast: Secondary | ICD-10-CM | POA: Diagnosis not present

## 2017-12-12 DIAGNOSIS — I341 Nonrheumatic mitral (valve) prolapse: Secondary | ICD-10-CM | POA: Insufficient documentation

## 2017-12-12 DIAGNOSIS — G459 Transient cerebral ischemic attack, unspecified: Secondary | ICD-10-CM | POA: Diagnosis not present

## 2017-12-12 DIAGNOSIS — Z79899 Other long term (current) drug therapy: Secondary | ICD-10-CM | POA: Diagnosis not present

## 2017-12-12 DIAGNOSIS — I10 Essential (primary) hypertension: Secondary | ICD-10-CM | POA: Diagnosis not present

## 2017-12-12 DIAGNOSIS — M6281 Muscle weakness (generalized): Secondary | ICD-10-CM | POA: Insufficient documentation

## 2017-12-12 DIAGNOSIS — Z803 Family history of malignant neoplasm of breast: Secondary | ICD-10-CM | POA: Insufficient documentation

## 2017-12-12 DIAGNOSIS — N6310 Unspecified lump in the right breast, unspecified quadrant: Secondary | ICD-10-CM | POA: Diagnosis not present

## 2017-12-12 DIAGNOSIS — F419 Anxiety disorder, unspecified: Secondary | ICD-10-CM | POA: Diagnosis not present

## 2017-12-12 DIAGNOSIS — N6091 Unspecified benign mammary dysplasia of right breast: Secondary | ICD-10-CM | POA: Insufficient documentation

## 2017-12-12 DIAGNOSIS — I651 Occlusion and stenosis of basilar artery: Secondary | ICD-10-CM | POA: Diagnosis not present

## 2017-12-12 DIAGNOSIS — R401 Stupor: Secondary | ICD-10-CM

## 2017-12-12 DIAGNOSIS — N6489 Other specified disorders of breast: Secondary | ICD-10-CM | POA: Diagnosis not present

## 2017-12-12 DIAGNOSIS — R4182 Altered mental status, unspecified: Secondary | ICD-10-CM | POA: Diagnosis not present

## 2017-12-12 DIAGNOSIS — F329 Major depressive disorder, single episode, unspecified: Secondary | ICD-10-CM | POA: Diagnosis not present

## 2017-12-12 DIAGNOSIS — G4733 Obstructive sleep apnea (adult) (pediatric): Secondary | ICD-10-CM | POA: Diagnosis not present

## 2017-12-12 DIAGNOSIS — Z6830 Body mass index (BMI) 30.0-30.9, adult: Secondary | ICD-10-CM | POA: Insufficient documentation

## 2017-12-12 DIAGNOSIS — E669 Obesity, unspecified: Secondary | ICD-10-CM | POA: Diagnosis not present

## 2017-12-12 DIAGNOSIS — E039 Hypothyroidism, unspecified: Secondary | ICD-10-CM | POA: Insufficient documentation

## 2017-12-12 DIAGNOSIS — Z79890 Hormone replacement therapy (postmenopausal): Secondary | ICD-10-CM | POA: Insufficient documentation

## 2017-12-12 DIAGNOSIS — R918 Other nonspecific abnormal finding of lung field: Secondary | ICD-10-CM | POA: Diagnosis not present

## 2017-12-12 DIAGNOSIS — N6081 Other benign mammary dysplasias of right breast: Secondary | ICD-10-CM | POA: Diagnosis not present

## 2017-12-12 DIAGNOSIS — R69 Illness, unspecified: Secondary | ICD-10-CM | POA: Diagnosis not present

## 2017-12-12 DIAGNOSIS — R928 Other abnormal and inconclusive findings on diagnostic imaging of breast: Secondary | ICD-10-CM | POA: Diagnosis not present

## 2017-12-12 DIAGNOSIS — R29818 Other symptoms and signs involving the nervous system: Secondary | ICD-10-CM | POA: Diagnosis not present

## 2017-12-12 DIAGNOSIS — E785 Hyperlipidemia, unspecified: Secondary | ICD-10-CM | POA: Insufficient documentation

## 2017-12-12 HISTORY — DX: Sleep apnea, unspecified: G47.30

## 2017-12-12 HISTORY — DX: Hypothyroidism, unspecified: E03.9

## 2017-12-12 HISTORY — DX: Adverse effect of unspecified anesthetic, initial encounter: T41.45XA

## 2017-12-12 HISTORY — DX: Other complications of anesthesia, initial encounter: T88.59XA

## 2017-12-12 HISTORY — DX: Unspecified lump in the right breast, unspecified quadrant: N63.10

## 2017-12-12 HISTORY — PX: RADIOACTIVE SEED GUIDED EXCISIONAL BREAST BIOPSY: SHX6490

## 2017-12-12 LAB — COMPREHENSIVE METABOLIC PANEL
ALT: 21 U/L (ref 14–54)
AST: 32 U/L (ref 15–41)
Albumin: 3.4 g/dL — ABNORMAL LOW (ref 3.5–5.0)
Alkaline Phosphatase: 70 U/L (ref 38–126)
Anion gap: 12 (ref 5–15)
BILIRUBIN TOTAL: 0.6 mg/dL (ref 0.3–1.2)
BUN: 9 mg/dL (ref 6–20)
CO2: 21 mmol/L — ABNORMAL LOW (ref 22–32)
CREATININE: 1.01 mg/dL — AB (ref 0.44–1.00)
Calcium: 9.2 mg/dL (ref 8.9–10.3)
Chloride: 106 mmol/L (ref 101–111)
GFR calc Af Amer: >60 mL/min (ref >60)
GFR, EST NON AFRICAN AMERICAN: 57 mL/min — AB (ref >60)
Glucose, Bld: 136 mg/dL — ABNORMAL HIGH (ref 65–99)
Potassium: 3.5 mmol/L (ref 3.5–5.1)
Sodium: 139 mmol/L (ref 135–145)
TOTAL PROTEIN: 5.9 g/dL — AB (ref 6.5–8.1)

## 2017-12-12 LAB — CBC
HEMATOCRIT: 45.4 % (ref 36.0–46.0)
Hemoglobin: 15.9 g/dL — ABNORMAL HIGH (ref 12.0–15.0)
MCH: 31.9 pg (ref 26.0–34.0)
MCHC: 35 g/dL (ref 30.0–36.0)
MCV: 91 fL (ref 78.0–100.0)
Platelets: 268 10*3/uL (ref 150–400)
RBC: 4.99 MIL/uL (ref 3.87–5.11)
RDW: 13.4 % (ref 11.5–15.5)
WBC: 10.6 10*3/uL — ABNORMAL HIGH (ref 4.0–10.5)

## 2017-12-12 LAB — POCT I-STAT, CHEM 8
BUN: 14 mg/dL (ref 6–20)
CALCIUM ION: 1.21 mmol/L (ref 1.15–1.40)
Chloride: 100 mmol/L — ABNORMAL LOW (ref 101–111)
Creatinine, Ser: 1 mg/dL (ref 0.44–1.00)
Glucose, Bld: 96 mg/dL (ref 65–99)
HEMATOCRIT: 46 % (ref 36.0–46.0)
HEMOGLOBIN: 15.6 g/dL — AB (ref 12.0–15.0)
Potassium: 3.3 mmol/L — ABNORMAL LOW (ref 3.5–5.1)
SODIUM: 140 mmol/L (ref 135–145)
TCO2: 26 mmol/L (ref 22–32)

## 2017-12-12 LAB — I-STAT ARTERIAL BLOOD GAS, ED
Acid-Base Excess: 1 mmol/L (ref 0.0–2.0)
BICARBONATE: 23.9 mmol/L (ref 20.0–28.0)
O2 SAT: 98 %
PCO2 ART: 33.6 mmHg (ref 32.0–48.0)
Patient temperature: 98.6
TCO2: 25 mmol/L (ref 22–32)
pH, Arterial: 7.46 — ABNORMAL HIGH (ref 7.350–7.450)
pO2, Arterial: 93 mmHg (ref 83.0–108.0)

## 2017-12-12 LAB — GLUCOSE, CAPILLARY: Glucose-Capillary: 120 mg/dL — ABNORMAL HIGH (ref 65–99)

## 2017-12-12 LAB — TROPONIN I

## 2017-12-12 SURGERY — RADIOACTIVE SEED GUIDED BREAST BIOPSY
Anesthesia: General | Site: Breast | Laterality: Right

## 2017-12-12 MED ORDER — ONDANSETRON 4 MG PO TBDP: 4.0000 mg | ORAL_TABLET | Freq: Four times a day (QID) | ORAL | Status: DC | PRN

## 2017-12-12 MED ORDER — NALOXONE HCL 0.4 MG/ML IJ SOLN
INTRAMUSCULAR | Status: AC
  Filled 2017-12-12: qty 1

## 2017-12-12 MED ORDER — BUPIVACAINE HCL (PF) 0.25 % IJ SOLN
INTRAMUSCULAR | Status: AC
  Filled 2017-12-12: qty 30

## 2017-12-12 MED ORDER — ACETAMINOPHEN 325 MG PO TABS
650.0000 mg | ORAL_TABLET | Freq: Four times a day (QID) | ORAL | Status: DC | PRN
  Administered 2017-12-14 – 2017-12-15 (×3): 650 mg via ORAL
  Filled 2017-12-12 (×6): qty 2

## 2017-12-12 MED ORDER — EPHEDRINE 5 MG/ML INJ
INTRAVENOUS | Status: AC
  Filled 2017-12-12: qty 10

## 2017-12-12 MED ORDER — HYDROMORPHONE HCL 1 MG/ML IJ SOLN: 0.2500 mg | INTRAMUSCULAR | Status: DC | PRN

## 2017-12-12 MED ORDER — CEFAZOLIN SODIUM-DEXTROSE 2-4 GM/100ML-% IV SOLN
INTRAVENOUS | Status: AC
  Filled 2017-12-12: qty 100

## 2017-12-12 MED ORDER — FENTANYL CITRATE (PF) 100 MCG/2ML IJ SOLN
INTRAMUSCULAR | Status: AC
  Filled 2017-12-12: qty 2

## 2017-12-12 MED ORDER — NALOXONE HCL 0.4 MG/ML IJ SOLN
0.2000 mg | INTRAMUSCULAR | Status: DC | PRN
  Administered 2017-12-12: 0.2 mg via INTRAVENOUS

## 2017-12-12 MED ORDER — SCOPOLAMINE 1 MG/3DAYS TD PT72: 1.0000 | MEDICATED_PATCH | Freq: Once | TRANSDERMAL | Status: DC | PRN

## 2017-12-12 MED ORDER — LACTATED RINGERS IV SOLN
INTRAVENOUS | Status: DC
  Administered 2017-12-12 (×2): via INTRAVENOUS
  Administered 2017-12-12: 10 mL/h via INTRAVENOUS

## 2017-12-12 MED ORDER — MEPERIDINE HCL 25 MG/ML IJ SOLN: 6.2500 mg | INTRAMUSCULAR | Status: DC | PRN

## 2017-12-12 MED ORDER — MIDAZOLAM HCL 2 MG/2ML IJ SOLN
INTRAMUSCULAR | Status: AC
  Filled 2017-12-12: qty 2

## 2017-12-12 MED ORDER — NALOXONE HCL 2 MG/2ML IJ SOSY
PREFILLED_SYRINGE | INTRAMUSCULAR | Status: AC
  Filled 2017-12-12: qty 2

## 2017-12-12 MED ORDER — ENOXAPARIN SODIUM 40 MG/0.4ML ~~LOC~~ SOLN
40.0000 mg | SUBCUTANEOUS | Status: DC
  Administered 2017-12-13 – 2017-12-14 (×2): 40 mg via SUBCUTANEOUS
  Filled 2017-12-12 (×4): qty 0.4

## 2017-12-12 MED ORDER — MIDAZOLAM HCL 2 MG/2ML IJ SOLN: 1.0000 mg | INTRAMUSCULAR | Status: DC | PRN

## 2017-12-12 MED ORDER — DEXAMETHASONE SODIUM PHOSPHATE 10 MG/ML IJ SOLN
INTRAMUSCULAR | Status: AC
  Filled 2017-12-12: qty 1

## 2017-12-12 MED ORDER — ACETAMINOPHEN 500 MG PO TABS
ORAL_TABLET | ORAL | Status: AC
  Filled 2017-12-12: qty 2

## 2017-12-12 MED ORDER — METHYLENE BLUE 0.5 % INJ SOLN
INTRAVENOUS | Status: AC
  Filled 2017-12-12: qty 10

## 2017-12-12 MED ORDER — FENTANYL CITRATE (PF) 100 MCG/2ML IJ SOLN
50.0000 ug | INTRAMUSCULAR | Status: DC | PRN
  Administered 2017-12-12 (×2): 50 ug via INTRAVENOUS

## 2017-12-12 MED ORDER — AMMONIA AROMATIC IN INHA
RESPIRATORY_TRACT | Status: AC
  Filled 2017-12-12: qty 10

## 2017-12-12 MED ORDER — ACETAMINOPHEN 650 MG RE SUPP: 650.0000 mg | Freq: Four times a day (QID) | RECTAL | Status: DC | PRN

## 2017-12-12 MED ORDER — IOPAMIDOL (ISOVUE-370) INJECTION 76%
INTRAVENOUS | Status: AC
  Filled 2017-12-12: qty 100

## 2017-12-12 MED ORDER — LIDOCAINE 2% (20 MG/ML) 5 ML SYRINGE
INTRAMUSCULAR | Status: DC | PRN
  Administered 2017-12-12: 100 mg via INTRAVENOUS

## 2017-12-12 MED ORDER — LIDOCAINE HCL (CARDIAC) 20 MG/ML IV SOLN
INTRAVENOUS | Status: DC | PRN
  Administered 2017-12-12: 100 mg via INTRAVENOUS

## 2017-12-12 MED ORDER — GABAPENTIN 300 MG PO CAPS
ORAL_CAPSULE | ORAL | Status: AC
  Filled 2017-12-12: qty 1

## 2017-12-12 MED ORDER — SODIUM CHLORIDE 0.9 % IV SOLN
INTRAVENOUS | Status: DC
  Administered 2017-12-12: 23:00:00 via INTRAVENOUS

## 2017-12-12 MED ORDER — ACETAMINOPHEN 500 MG PO TABS
1000.0000 mg | ORAL_TABLET | ORAL | Status: AC
  Administered 2017-12-12: 1000 mg via ORAL

## 2017-12-12 MED ORDER — PROPOFOL 500 MG/50ML IV EMUL
INTRAVENOUS | Status: AC
  Filled 2017-12-12: qty 50

## 2017-12-12 MED ORDER — ONDANSETRON HCL 4 MG/2ML IJ SOLN
INTRAMUSCULAR | Status: AC
  Filled 2017-12-12: qty 2

## 2017-12-12 MED ORDER — LIDOCAINE HCL (CARDIAC) 20 MG/ML IV SOLN
INTRAVENOUS | Status: AC
  Filled 2017-12-12: qty 5

## 2017-12-12 MED ORDER — PROPOFOL 10 MG/ML IV BOLUS
INTRAVENOUS | Status: DC | PRN
  Administered 2017-12-12: 160 mg via INTRAVENOUS

## 2017-12-12 MED ORDER — ONDANSETRON HCL 4 MG/2ML IJ SOLN
INTRAMUSCULAR | Status: DC | PRN
  Administered 2017-12-12: 4 mg via INTRAVENOUS

## 2017-12-12 MED ORDER — DEXAMETHASONE SODIUM PHOSPHATE 4 MG/ML IJ SOLN
INTRAMUSCULAR | Status: DC | PRN
  Administered 2017-12-12: 10 mg via INTRAVENOUS

## 2017-12-12 MED ORDER — ONDANSETRON HCL 4 MG/2ML IJ SOLN: 4.0000 mg | Freq: Four times a day (QID) | INTRAMUSCULAR | Status: DC | PRN

## 2017-12-12 MED ORDER — SUCCINYLCHOLINE CHLORIDE 200 MG/10ML IV SOSY
PREFILLED_SYRINGE | INTRAVENOUS | Status: AC
  Filled 2017-12-12: qty 10

## 2017-12-12 MED ORDER — SODIUM CHLORIDE 0.9 % IJ SOLN
INTRAMUSCULAR | Status: AC
  Filled 2017-12-12: qty 10

## 2017-12-12 MED ORDER — PHENYLEPHRINE 40 MCG/ML (10ML) SYRINGE FOR IV PUSH (FOR BLOOD PRESSURE SUPPORT)
PREFILLED_SYRINGE | INTRAVENOUS | Status: AC
  Filled 2017-12-12: qty 10

## 2017-12-12 MED ORDER — ASPIRIN EC 81 MG PO TBEC
81.0000 mg | DELAYED_RELEASE_TABLET | Freq: Every day | ORAL | Status: DC
  Administered 2017-12-13 – 2017-12-15 (×3): 81 mg via ORAL
  Filled 2017-12-12 (×3): qty 1

## 2017-12-12 MED ORDER — BUPIVACAINE HCL (PF) 0.25 % IJ SOLN
INTRAMUSCULAR | Status: DC | PRN
  Administered 2017-12-12: 10 mL

## 2017-12-12 MED ORDER — SIMETHICONE 80 MG PO CHEW: 40.0000 mg | CHEWABLE_TABLET | Freq: Four times a day (QID) | ORAL | Status: DC | PRN

## 2017-12-12 MED ORDER — NALOXONE HCL 0.4 MG/ML IJ SOLN: 0.2000 mg | INTRAMUSCULAR | Status: DC | PRN

## 2017-12-12 MED ORDER — PROMETHAZINE HCL 25 MG/ML IJ SOLN: 6.2500 mg | INTRAMUSCULAR | Status: DC | PRN

## 2017-12-12 MED ORDER — CEFAZOLIN SODIUM-DEXTROSE 2-4 GM/100ML-% IV SOLN
2.0000 g | INTRAVENOUS | Status: AC
  Administered 2017-12-12: 2 g via INTRAVENOUS

## 2017-12-12 MED ORDER — GABAPENTIN 300 MG PO CAPS
300.0000 mg | ORAL_CAPSULE | ORAL | Status: AC
  Administered 2017-12-12: 300 mg via ORAL

## 2017-12-12 SURGICAL SUPPLY — 60 items
ADH SKN CLS APL DERMABOND .7 (GAUZE/BANDAGES/DRESSINGS) ×1
APPLIER CLIP 9.375 MED OPEN (MISCELLANEOUS)
APR CLP MED 9.3 20 MLT OPN (MISCELLANEOUS)
BINDER BREAST LRG (GAUZE/BANDAGES/DRESSINGS) ×1 IMPLANT
BINDER BREAST MEDIUM (GAUZE/BANDAGES/DRESSINGS) IMPLANT
BINDER BREAST XLRG (GAUZE/BANDAGES/DRESSINGS) IMPLANT
BINDER BREAST XXLRG (GAUZE/BANDAGES/DRESSINGS) IMPLANT
BLADE SURG 15 STRL LF DISP TIS (BLADE) ×1 IMPLANT
BLADE SURG 15 STRL SS (BLADE) ×2
CANISTER SUC SOCK COL 7IN (MISCELLANEOUS) IMPLANT
CANISTER SUCT 1200ML W/VALVE (MISCELLANEOUS) ×2 IMPLANT
CHLORAPREP W/TINT 26ML (MISCELLANEOUS) ×2 IMPLANT
CLIP APPLIE 9.375 MED OPEN (MISCELLANEOUS) IMPLANT
CLIP VESOCCLUDE SM WIDE 6/CT (CLIP) ×1 IMPLANT
COVER BACK TABLE 60X90IN (DRAPES) ×2 IMPLANT
COVER MAYO STAND STRL (DRAPES) ×2 IMPLANT
COVER PROBE W GEL 5X96 (DRAPES) ×2 IMPLANT
DECANTER SPIKE VIAL GLASS SM (MISCELLANEOUS) ×1 IMPLANT
DERMABOND ADVANCED (GAUZE/BANDAGES/DRESSINGS) ×1
DERMABOND ADVANCED .7 DNX12 (GAUZE/BANDAGES/DRESSINGS) ×1 IMPLANT
DEVICE DUBIN W/COMP PLATE 8390 (MISCELLANEOUS) ×2 IMPLANT
DRAPE LAPAROSCOPIC ABDOMINAL (DRAPES) ×2 IMPLANT
DRAPE UTILITY XL STRL (DRAPES) ×2 IMPLANT
DRSG TEGADERM 4X4.75 (GAUZE/BANDAGES/DRESSINGS) IMPLANT
ELECT COATED BLADE 2.86 ST (ELECTRODE) ×2 IMPLANT
ELECT REM PT RETURN 9FT ADLT (ELECTROSURGICAL) ×2
ELECTRODE REM PT RTRN 9FT ADLT (ELECTROSURGICAL) ×1 IMPLANT
GAUZE SPONGE 4X4 12PLY STRL LF (GAUZE/BANDAGES/DRESSINGS) IMPLANT
GLOVE BIO SURGEON STRL SZ7 (GLOVE) ×4 IMPLANT
GLOVE BIOGEL PI IND STRL 7.0 (GLOVE) IMPLANT
GLOVE BIOGEL PI IND STRL 7.5 (GLOVE) ×1 IMPLANT
GLOVE BIOGEL PI INDICATOR 7.0 (GLOVE) ×3
GLOVE BIOGEL PI INDICATOR 7.5 (GLOVE) ×2
GLOVE ECLIPSE 6.5 STRL STRAW (GLOVE) ×1 IMPLANT
GOWN STRL REUS W/ TWL LRG LVL3 (GOWN DISPOSABLE) ×2 IMPLANT
GOWN STRL REUS W/TWL LRG LVL3 (GOWN DISPOSABLE) ×8
HEMOSTAT ARISTA ABSORB 3G PWDR (MISCELLANEOUS) ×1 IMPLANT
ILLUMINATOR WAVEGUIDE N/F (MISCELLANEOUS) ×1 IMPLANT
KIT MARKER MARGIN INK (KITS) ×2 IMPLANT
LIGHT WAVEGUIDE WIDE FLAT (MISCELLANEOUS) IMPLANT
NDL HYPO 25X1 1.5 SAFETY (NEEDLE) ×1 IMPLANT
NEEDLE HYPO 25X1 1.5 SAFETY (NEEDLE) ×2 IMPLANT
NS IRRIG 1000ML POUR BTL (IV SOLUTION) ×1 IMPLANT
PACK BASIN DAY SURGERY FS (CUSTOM PROCEDURE TRAY) ×2 IMPLANT
PENCIL BUTTON HOLSTER BLD 10FT (ELECTRODE) ×2 IMPLANT
SLEEVE SCD COMPRESS KNEE MED (MISCELLANEOUS) ×2 IMPLANT
SPONGE LAP 4X18 X RAY DECT (DISPOSABLE) ×4 IMPLANT
STRIP CLOSURE SKIN 1/2X4 (GAUZE/BANDAGES/DRESSINGS) ×2 IMPLANT
SUT MNCRL AB 4-0 PS2 18 (SUTURE) ×1 IMPLANT
SUT MON AB 5-0 PS2 18 (SUTURE) ×1 IMPLANT
SUT SILK 2 0 SH (SUTURE) ×1 IMPLANT
SUT VIC AB 2-0 SH 27 (SUTURE) ×2
SUT VIC AB 2-0 SH 27XBRD (SUTURE) ×1 IMPLANT
SUT VIC AB 3-0 SH 27 (SUTURE) ×2
SUT VIC AB 3-0 SH 27X BRD (SUTURE) ×1 IMPLANT
SYR CONTROL 10ML LL (SYRINGE) ×2 IMPLANT
TOWEL OR 17X24 6PK STRL BLUE (TOWEL DISPOSABLE) ×2 IMPLANT
TOWEL OR NON WOVEN STRL DISP B (DISPOSABLE) ×1 IMPLANT
TUBE CONNECTING 20X1/4 (TUBING) ×1 IMPLANT
YANKAUER SUCT BULB TIP NO VENT (SUCTIONS) ×1 IMPLANT

## 2017-12-12 NOTE — Code Documentation (Signed)
67 yo female transported to ED from [facility] after a left sided lumpectomy. LSN was at 0730 with time of start of surgery. [facility] staff noted that patient was having trouble walking at 1115. Pt became unresponsive to staff and was given Narcan twice with no response. [facility] was called and reported that patient was unresponsive. Arrived to the ED and taken to CT per EDP with concern of stroke. Stroke Team and Neurologist at the bedside. Initial NIHSS 33. Pt has right sided gaze, no movement in her bilateral extremities, mute, and not following any commands. Allergy from contrast prevented CTA and pt was taken for STAT MRI. During transport, pt has become more responsive. Pt able to answer questions appropriately, movement of all limbs present, but still having sluggish response.

## 2017-12-12 NOTE — Progress Notes (Signed)
Patient awake and alert, requesting trip to [facility].  Up to [facility] with assisst,  Ambulate well.  Oriented to person, place and time.  Dr. Dian Queen at bedside and witnesses patient status.

## 2017-12-12 NOTE — ED Notes (Signed)
Pt complains of "tightness" in R hand; states it is tingling, can move all fingers, squeeze hand; and distinguish touch

## 2017-12-12 NOTE — Progress Notes (Signed)
Patient responds to voice .  She has equal movement and strength to extremities.

## 2017-12-12 NOTE — Consult Note (Addendum)
Requesting Physician: ED MD    Chief Complaint: AMS after day surgery  History obtained from:  chart  HPI:                                                                                                                                         Rebecca Steele is an 67 y.o. female who had a lumpectomy this AM. After surgery she was not waking up --She received Narcan times two and awoke enough after the initial dose to go to bathroom with assistance. Patient was brought to [facility] ED as a code stroke. Initially she had a right gaze deviation but just before another dose of Narcan was administered she started to come around with noxious stimuli. She was able to stick her tongue out to command, able to look left and right. Of note her mouth was clenched shut prior to her sticking her tongue out but with pain opened mouth. She then turned her head away from an ammonia inhalant. CT head was negative and patient was brought to MRI. At this time she was able to move all 4 extremities equally to noxious, show her thumb and state that she was at [facility].   Date last known well: Date: 12/12/2017 Time last known well: Time: 07:30 tPA Given: No: no focal deficit  Modified Rankin: Rankin Score=0   Past Medical History:  Diagnosis Date  . Breast mass, right   . Complication of anesthesia    states she has had 2 neck surgeries and her neck is stiff, hard to wake  . Depression   . Hematuria - cause not known   . Hypertension   . Hypothyroidism   . Kidney stones   . Mitral valve prolapse   . Renal disease   . Sleep apnea    HAS MILD OSA, NO CPAP NEEDED    Past Surgical History:  Procedure Laterality Date  . APPENDECTOMY    . CHOLECYSTECTOMY    . CYSTOSCOPY W/ RETROGRADES    . NECK SURGERY    . SHOULDER SURGERY    . TONSILLECTOMY    . TRIGGER FINGER RELEASE Right 05/18/2015   Procedure: RIGHT  LONG FINGER TRIGGER RELEASE ;  Surgeon: Milly Jakob, MD;  Location: [place];  Service: Orthopedics;  Laterality: Right;    Family history:  Unknown - patient unable to relate due to AMS  Social History:  reports that  has never smoked. she has never used smokeless tobacco. She reports that she does not drink alcohol or use drugs.  Allergies:  Allergies  Allergen Reactions  . Valium Anaphylaxis  . Doxycycline Swelling  . Iohexol      Desc: Pt. stated she had iv contrast around 25 yrs ago and had itching on her neck.  she took benadryl and it went away.  The rad recommended premeds and be done tomorrow  but the pt.did not want to do that so we did her w/o iv contrast today., Onset Date: [id number]   . Macrodantin Nausea And Vomiting  . Rocephin [Ceftriaxone] Other (See Comments)    Joint aches  . Zithromax [Azithromycin]     Weakness, cold/clammy, legs trembling    Medications:                                                                                                                           Current Facility-Administered Medications  Medication Dose Route Frequency Provider Last Rate Last Dose  . naloxone (NARCAN) 2 MG/2ML injection            Current Outpatient Medications  Medication Sig Dispense Refill  . levothyroxine (SYNTHROID, LEVOTHROID) 25 MCG tablet Take 25 mcg by mouth daily before breakfast.    . sertraline (ZOLOFT) 100 MG tablet Take 100 mg by mouth daily.    . traZODone (DESYREL) 100 MG tablet Take 100 mg by mouth at bedtime.    . triamterene-hydrochlorothiazide (DYAZIDE) 37.5-25 MG per capsule Take 1 capsule by mouth daily. TAKE 1/2 TAB DAILY     Facility-Administered Medications Ordered in Other Encounters  Medication Dose Route Frequency Provider Last Rate Last Dose  . ammonia inhalant            ROS:                                                                                                                                       ROS unobtainable from patient due to mental status  General Examination:                                                                                                       Blood pressure 126/74, pulse 84, temperature 98.8 F (37.1 C), resp. rate 12, height 5\' 6"  (1.676 m), weight 83.4 kg (183 lb 12.8 oz), SpO2 97 %.  HEENT-  Normocephalic, no lesions, without  obvious abnormality.  Normal external eye and conjunctiva.   Cardiovascular- S1-S2 audible, pulses palpable throughout   Lungs-no rhonchi or wheezing noted, no excessive working breathing.  Saturations within normal limits Abdomen- All 4 quadrants palpated and nontender Extremities- Warm, dry and intact Musculoskeletal-no joint tenderness, deformity or swelling Skin-warm and dry, no hyperpigmentation, vitiligo, or suspicious lesions  Neurological Examination Mental Status: Initially she was not communicative but later able to state she was in [place] cone, followed simple commands, no dysarthria but talked in a hushed tone of voice.  Cranial Nerves: II: Initially with no blink to threat. After arousal with noxious stimuli and ammonia, she blinks to threat biulaterally  III,IV, VI: ptosis not present. Initially with right sided gaze deviation. After arousal with noxious stimuli and ammonia she exhibits normal EOM with saccades and visual tracking. No nystagmus.  V,VII: Face symmetric, grimaces to brow ridge pressure bilaterally VIII: opens eyes to voice after arousal with ammonia inhalant XII: midline tongue extension Motor: Initially with increased lower extremity tone bilaterally. During arousal with noxious stimuli, she lifts right arm to pain. Subsequently, following additional noxious stimuli, she moves all 4 extremities normally without asymmetry. Of note, there was shaking of right leg but this was distractible.  Sensory: Intact x 4 to pinch while initially not responsive. Subsequently with subjective intact sensation to pinprick and light touch following arousal with ammonia and noxious stimuli Deep Tendon Reflexes: 3+ and  symmetric throughout initially. Subsequently 2+ throughout after arousal.  Plantars:  Right: downgoing   Left: downgoing Cerebellar: Not following commands for testing Gait: Unable to assess   Lab Results: Basic Metabolic Panel: Recent Labs  Lab 12/12/17 0705  NA 140  K 3.3*  CL 100*  GLUCOSE 96  BUN 14  CREATININE 1.00    CBC: Recent Labs  Lab 12/12/17 0705  HGB 15.6*  HCT 46.0    Lipid Panel: No results for input(s): CHOL, TRIG, HDL, CHOLHDL, VLDL, LDLCALC in the last 168 hours.  CBG: Recent Labs  Lab 12/12/17 1351  GLUCAP 120*    Imaging: Mm Breast Surgical Specimen  Result Date: 12/12/2017 CLINICAL DATA:  Specimen radiograph status post right breast lumpectomy. EXAM: SPECIMEN RADIOGRAPH OF THE RIGHT BREAST COMPARISON:  Previous exam(s). FINDINGS: Status post excision of the right breast. Neither the radioactive seed nor the biopsy marking clip is seen within the specimen, however the area of distortion does appear to be within the specimen. This was marked for pathology. The radioactive seed is seen separately in a small dish and is completely intact. I do not see the biopsy marking clip within the specimen radiograph nor in the separate small dish with the radioactive seed, however Dr. Donne Hazel believes that this was suctioned out with the patient's hematoma that was present following biopsy. This was communicated to Dr. Donne Hazel in the OR at 8:20 a.m. IMPRESSION: Specimen radiograph of the right breast. Electronically Signed   By: Ammie Ferrier M.D.   On: 12/12/2017 08:26    Assessment and plan discussed with with attending physician and they are in agreement.   Etta Quill PA-C [facility] [phone]  12/12/2017, 3:11 PM   Assessment: 67 y.o. female who presented to ED with altered mental status after day surgery. Initially unresponsive with a rightward gaze that alternated with upgaze that was somewhat varying in response to external stimuli  and did not appear strongly consistent with an organic etiology. Following noxious stimuli and ammonia inhalant, her gaze normalized and motor function was noted to  be symmetric x 4, with no facial droop or aphasia. DDx includes TIA, slow recovery from anesthesia, deep sedation secondary to opioid and conversion/malingering/factitious disorder.  1. STAT MRI brain negative for acute stroke 2. 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 Stroke Risk Factors - hypertension  4. Of note, she has a history of slow arousal following anesthesia  Recommendations: 1. Monitor in observation unit or on the floor for 24 hours 2. TTE 3. Carotid ultrasound 4. ASA 81 mg po qd 5. Telemetry 6. Permissive HTN x 24 hours 7. PT/OT/Speech  45 minutes spent in the emergent neurological evaluation and management of this patient with acute obtundation and right gaze deviation.   I have seen and examined the patient. I have discussed the assessment and plan with the neurology team.  Electronically signed: Dr. Kerney Elbe

## 2017-12-12 NOTE — ED Triage Notes (Signed)
Per off going RN, pt from [facility] day [facility] BIB [facility], right lumpectomy performed this am. Pt difficult to arouse, given total 0.4mg  narcan PTA with no change. Pt unresponsive with [facility] and initially on arrival. This RN present at bedside at 1505, pt alert and answering questions appropriately. Transported to MRI with Janett Billow RN, Cephas Darby, RN and Lowella Petties, RN.

## 2017-12-12 NOTE — Anesthesia Postprocedure Evaluation (Signed)
Anesthesia Post Note  Patient: Rebecca Steele  Procedure(s) Performed: RIGHT RADIOACTIVE SEED GUIDED EXCISIONAL BREAST BIOPSY ERAS PATHWAY (Right Breast)     Anesthesia Post Evaluation  Last Vitals:  Vitals:   12/12/17 1315 12/12/17 1330  BP:    Pulse: 87 86  Resp:    Temp:    SpO2: 95% 96%    Last Pain:  Vitals:   12/12/17 1315  TempSrc:   PainSc: 0-No pain                 Nolon Nations

## 2017-12-12 NOTE — Op Note (Signed)
Preoperative diagnoses:right breast mass on mammogram, core biopsy with csl Postoperative diagnosis: Same as above Procedure:Right breastseed guided excisional biopsy Surgeon: Dr. Serita Grammes Anesthesia: Gen. Estimated blood loss: minimal Complications: None Drains: None Specimens:rightbreast tissue marked with paint, seed separate, posterior margin short superior long lateral double deep Sponge and needle count correct at completion Disposition to recovery stable  Indications: This is a78 yof with a core biopsy of a right breast distortion that is a csl.  We discussed options and she elected for seed guided excisional biopsy.   Procedure: After informed consent was obtained she was then taken to the operating room. She was given antibiotics.Sequential compression devices were on her legs. She was placed under general anesthesia without complication. Her chestwas then prepped and draped in the standard sterile surgical fashion. A surgical timeout was then performed.   The seed was in theupper inner right breast. I made a periareolar incision to hide the scar.  I infiltrated marcaine throughout the area.I used the lighted retractor to tunnel to the area.  She had a large hematoma still, more than what it appeared clinically.  I entered the hematoma and used the suction.  During this the radioactive seed was suctioned up but I had the area it was in marked and held with an allis clamp.  I then removed all of this tissue. The mammogram shows the distortion and this was confirmed by radiology.  The clip was not present but I think this was suctioned up as well. The seed was located in the suction cannister and control was maintained. I did remove some additional posterior tissue down to the muscle.  This was then all sent to pathology. Hemostasis was observed. I closed the breast tissue with a 2-0 Vicryl. The dermis was closed with 3-0 Vicryl and the skin with 5-0 Monocryl.Dermabond  and steristrips were placed on the incision. A breast binder was placed. She was transferred to recovery stable

## 2017-12-12 NOTE — Progress Notes (Signed)
Patient ID: Rebecca Steele, female   DOB: [birth year], 67 y.o.   MRN: [id number] I was notified by Dr Lissa Hoard this afternoon about patient not waking up from anesthesia. Have discussed history with he and other staff.  She has received narcan times two and awoke enough at one point to go to bathroom with assistance. She also apparently localized to pain with dressing. She is now somnolent with normal vitals. Has not had co2 checked at this point.  I discussed with family that she is being sent to er asap as I think evaluation would be quicker and better at this point. I have discussed with EDP as well and will follow up.

## 2017-12-12 NOTE — Brief Op Note (Signed)
12/12/2017  8:43 AM  PATIENT:  Rebecca Steele  67 y.o. female  PRE-OPERATIVE DIAGNOSIS:  right breast mass  POST-OPERATIVE DIAGNOSIS:  right breast mass  PROCEDURE:  Procedure(s) with comments: RIGHT RADIOACTIVE SEED GUIDED EXCISIONAL BREAST BIOPSY ERAS PATHWAY (Right) - LMA  SURGEON:  Surgeon(s) and Role:    Rolm Bookbinder, MD - Primary  PHYSICIAN ASSISTANT:   ASSISTANTS: none   ANESTHESIA:   general  EBL:  80 mL   BLOOD ADMINISTERED:none  DRAINS: none   LOCAL MEDICATIONS USED:  MARCAINE     SPECIMEN:  Lumpectomy  DISPOSITION OF SPECIMEN:  PATHOLOGY  COUNTS:  YES  TOURNIQUET:  * No tourniquets in log *  DICTATION: .Dragon Dictation  PLAN OF CARE: Discharge to home after PACU  PATIENT DISPOSITION:  PACU - hemodynamically stable.   Delay start of Pharmacological VTE agent (>24hrs) due to surgical blood loss or risk of bleeding: not applicable

## 2017-12-12 NOTE — ED Provider Notes (Signed)
[facility] EMERGENCY DEPARTMENT Provider Note   CSN: [id number] Arrival date & time: 12/12/17  1448     History   Chief Complaint Chief Complaint  Patient presents with  . Altered Mental Status    HPI Rebecca Steele is a 67 y.o. female.  HPI Patient is a 67 year old female presents to the emergency department from the outpatient surgical center with altered mental status.  She underwent lumpectomy which was uncomplicated this morning.  She was being evaluated in the PACU and never completely woke up.  She was given Narcan several times without improvement.  Sent to the ER to rapidly evaluate.  On arrival of the critical care transport team they state the patient was unresponsive even to deep sternal rub.  She was quickly transported to the emergency department on arrival to the emergency department was a code stroke.  She was taken to immediate CT scan which demonstrated no acute abnormalities an MRI which demonstrated no evidence of stroke.  On return from the MRI scanner she was waking up following commands and moving all 4 extremities.  Her only complaint was mild headache at that time.  She had no seizure activity.  No history of seizures.  Family reports she has been sleepy after anesthesia before in the past but never to this extent.  Blood sugar was in the low 100s when checked at the surgical center.  No chest pain or shortness of breath.    Past Medical History:  Diagnosis Date  . Breast mass, right   . Complication of anesthesia    states she has had 2 neck surgeries and her neck is stiff, hard to wake  . Depression   . Hematuria - cause not known   . Hypertension   . Hypothyroidism   . Kidney stones   . Mitral valve prolapse   . Renal disease   . 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 Obtundation 12/12/2017    Past Surgical History:  Procedure Laterality Date  . APPENDECTOMY      . CHOLECYSTECTOMY    . CYSTOSCOPY W/ RETROGRADES    . NECK SURGERY    . SHOULDER SURGERY    . TONSILLECTOMY    . TRIGGER FINGER RELEASE Right 05/18/2015   Procedure: RIGHT  LONG FINGER TRIGGER RELEASE ;  Surgeon: Milly Jakob, MD;  Location: [facility];  Service: Orthopedics;  Laterality: Right;    OB History    No data available       Home Medications    Prior to Admission medications   Medication Sig Start Date End Date Taking? Authorizing Provider  acetaminophen (TYLENOL) 500 MG tablet Take 500-1,000 mg by mouth every 6 (six) hours as needed for headache (pain).   Yes [provider]  butalbital-acetaminophen-caffeine (ESGIC) 50-325-40 MG tablet Take 1 tablet by mouth 2 (two) times daily as needed for headache or migraine.   Yes [provider]  ibuprofen (ADVIL,MOTRIN) 200 MG tablet Take 200 mg by mouth every 6 (six) hours as needed for headache (pain).   Yes [provider]  levothyroxine (SYNTHROID, LEVOTHROID) 25 MCG tablet Take 25 mcg by mouth daily before breakfast.   Yes [provider]  sertraline (ZOLOFT) 100 MG tablet Take 100 mg by mouth daily.   Yes [provider]  traZODone (DESYREL) 100 MG tablet Take 100 mg by mouth at  bedtime.   Yes [provider]  triamterene-hydrochlorothiazide (MAXZIDE-25) 37.5-25 MG tablet Take 0.5 tablets by mouth daily.  12/06/17  Yes [provider]    Family History History reviewed. No pertinent family history.  Social History Social History   Tobacco Use  . Smoking status: Never Smoker  . Smokeless tobacco: Never Used  Substance Use Topics  . Alcohol use: No  . Drug use: No     Allergies   Valium; Doxycycline; Iohexol; Macrodantin; Red dye; Rocephin [ceftriaxone]; and Zithromax [azithromycin]   Review of Systems Review of Systems   Physical Exam Updated Vital Signs BP 139/84   Pulse 76   Temp 98.8 F (37.1 C)   Resp 11   Ht 5\' 6"   (1.676 m)   Wt 83.4 kg (183 lb 12.8 oz)   SpO2 92%   BMI 29.67 kg/m   Physical Exam   ED Treatments / Results  Labs (all labs ordered are listed, but only abnormal results are displayed) Labs Reviewed  GLUCOSE, CAPILLARY - Abnormal; Notable for the following components:      Result Value   Glucose-Capillary 120 (*)    All other components within normal limits  CBC - Abnormal; Notable for the following components:   WBC 10.6 (*)    Hemoglobin 15.9 (*)    All other components within normal limits  COMPREHENSIVE METABOLIC PANEL - Abnormal; Notable for the following components:   CO2 21 (*)    Glucose, Bld 136 (*)    Creatinine, Ser 1.01 (*)    Total Protein 5.9 (*)    Albumin 3.4 (*)    GFR calc non Af Amer 57 (*)    All other components within normal limits  POCT I-STAT, CHEM 8 - Abnormal; Notable for the following components:   Potassium 3.3 (*)    Chloride 100 (*)    Hemoglobin 15.6 (*)    All other components within normal limits  I-STAT ARTERIAL BLOOD GAS, ED - Abnormal; Notable for the following components:   pH, Arterial 7.460 (*)    All other components within normal limits  TROPONIN I  RAPID URINE DRUG SCREEN, HOSP PERFORMED  SURGICAL PATHOLOGY    EKG  EKG Interpretation None       Radiology Mr [name] Head Wo Contrast  Result Date: 12/12/2017 CLINICAL DATA:  Follow-up code stroke. Became unresponsive after lumpectomy today. Symptoms improving. EXAM: MRI HEAD WITHOUT CONTRAST MRA HEAD WITHOUT CONTRAST TECHNIQUE: Axial and coronal diffusion weighted imaging, axial T2 FLAIR obtained without contrast. Angiographic images of the head were obtained using MRA technique without contrast. COMPARISON:  CT HEAD December 12, 2017 at 1459 hours FINDINGS: MRI HEAD FINDINGS INTRACRANIAL CONTENTS: No reduced diffusion to suggest acute ischemia. The ventricles and sulci are normal for patient's age. No suspicious parenchymal signal, masses, mass effect. No abnormal extra-axial  fluid collections. No extra-axial masses. VASCULAR: See below. SKULL AND UPPER CERVICAL SPINE: Nondiagnostic assessment. SINUSES/ORBITS: Included paranasal sinuses and mastoid air cells are well aerated. Ocular globes and orbital contents are non suspicious, gaze to the RIGHT. OTHER: None. MRA HEAD FINDINGS ANTERIOR CIRCULATION: Normal flow related enhancement of the included cervical, petrous, cavernous and supraclinoid internal carotid arteries. Patent anterior communicating artery. Patent anterior and middle cerebral arteries, including distal segments. No large vessel occlusion, flow limiting stenosis, aneurysm. POSTERIOR CIRCULATION: Codominant vertebral arteries. Moderate narrowed mid basilar artery. Basilar artery is patent, with normal flow related enhancement of the main branch vessels. Patent posterior cerebral arteries. No large vessel  occlusion, flow limiting stenosis,  aneurysm. ANATOMIC VARIANTS: None. Source images and MIP images were reviewed. IMPRESSION: MRI HEAD: 1. Normal limited noncontrast MRI of the head, stroke protocol. MRA HEAD: 1. No emergent large vessel occlusion or flow limiting stenosis. 2. Moderate stenosis mid basilar artery. Critical Value/emergent results text paged to Dr.ERIC [facility] via AMION secure system on 12/12/2017 at 3:48 pm, including interpreting physician's phone number. Electronically Signed   By: Elon Alas M.D.   On: 12/12/2017 15:50   Mr Brain Wo Contrast  Result Date: 12/12/2017 CLINICAL DATA:  Follow-up code stroke. Became unresponsive after lumpectomy today. Symptoms improving. EXAM: MRI HEAD WITHOUT CONTRAST MRA HEAD WITHOUT CONTRAST TECHNIQUE: Axial and coronal diffusion weighted imaging, axial T2 FLAIR obtained without contrast. Angiographic images of the head were obtained using MRA technique without contrast. COMPARISON:  CT HEAD December 12, 2017 at 1459 hours FINDINGS: MRI HEAD FINDINGS INTRACRANIAL CONTENTS: No reduced diffusion to suggest acute  ischemia. The ventricles and sulci are normal for patient's age. No suspicious parenchymal signal, masses, mass effect. No abnormal extra-axial fluid collections. No extra-axial masses. VASCULAR: See below. SKULL AND UPPER CERVICAL SPINE: Nondiagnostic assessment. SINUSES/ORBITS: Included paranasal sinuses and mastoid air cells are well aerated. Ocular globes and orbital contents are non suspicious, gaze to the RIGHT. OTHER: None. MRA HEAD FINDINGS ANTERIOR CIRCULATION: Normal flow related enhancement of the included cervical, petrous, cavernous and supraclinoid internal carotid arteries. Patent anterior communicating artery. Patent anterior and middle cerebral arteries, including distal segments. No large vessel occlusion, flow limiting stenosis, aneurysm. POSTERIOR CIRCULATION: Codominant vertebral arteries. Moderate narrowed mid basilar artery. Basilar artery is patent, with normal flow related enhancement of the main branch vessels. Patent posterior cerebral arteries. No large vessel occlusion, flow limiting stenosis,  aneurysm. ANATOMIC VARIANTS: None. Source images and MIP images were reviewed. IMPRESSION: MRI HEAD: 1. Normal limited noncontrast MRI of the head, stroke protocol. MRA HEAD: 1. No emergent large vessel occlusion or flow limiting stenosis. 2. Moderate stenosis mid basilar artery. Critical Value/emergent results text paged to Dr.ERIC [facility] via AMION secure system on 12/12/2017 at 3:48 pm, including interpreting physician's phone number. Electronically Signed   By: Elon Alas M.D.   On: 12/12/2017 15:50   Mm Breast Surgical Specimen  Result Date: 12/12/2017 CLINICAL DATA:  Specimen radiograph status post right breast lumpectomy. EXAM: SPECIMEN RADIOGRAPH OF THE RIGHT BREAST COMPARISON:  Previous exam(s). FINDINGS: Status post excision of the right breast. Neither the radioactive seed nor the biopsy marking clip is seen within the specimen, however the area of distortion does appear to be  within the specimen. This was marked for pathology. The radioactive seed is seen separately in a small dish and is completely intact. I do not see the biopsy marking clip within the specimen radiograph nor in the separate small dish with the radioactive seed, however Dr. Donne Hazel believes that this was suctioned out with the patient's hematoma that was present following biopsy. This was communicated to Dr. Donne Hazel in the OR at 8:20 a.m. IMPRESSION: Specimen radiograph of the right breast. Electronically Signed   By: Ammie Ferrier M.D.   On: 12/12/2017 08:26   Dg Chest Portable 1 View  Result Date: 12/12/2017 CLINICAL DATA:  Initial evaluation for acute altered mental status. EXAM: PORTABLE CHEST 1 VIEW COMPARISON:  Prior radiograph from 08/15/2017. FINDINGS: Cardiomegaly.  Mediastinal silhouette normal. Lungs hypoinflated. Probable small bilateral pleural effusions. Diffuse pulmonary and interstitial prominence, compatible pulmonary interstitial edema. Superimposed more confluent opacity at the left lung base may  reflect atelectasis or infiltrate. No pneumothorax. No acute osseous abnormality. IMPRESSION: 1. Cardiomegaly with mild diffuse pulmonary interstitial edema and probable small bilateral pleural effusions. 2. Superimposed more confluent left basilar opacity, which may reflect atelectasis and/or infiltrate. Electronically Signed   By: Jeannine Boga M.D.   On: 12/12/2017 17:33   Ct Head Code Stroke Wo Contrast`  Result Date: 12/12/2017 CLINICAL DATA:  Code stroke. Unresponsive following breast surgery, lumpectomy, had surgical center. EXAM: CT HEAD WITHOUT CONTRAST TECHNIQUE: Contiguous axial images were obtained from the base of the skull through the vertex without intravenous contrast. COMPARISON:  None. FINDINGS: Brain: No acute infarct, hemorrhage, or mass lesion is present. The ventricles are of normal size. No significant extraaxial fluid collection is present. No significant white  matter disease is present. The brainstem and cerebellum are normal. Vascular: Atherosclerotic calcifications are present in the cavernous internal carotid arteries bilaterally. There is no hyperdense vessel. Skull: Calvarium is intact. No focal lytic or blastic lesions are present. Sinuses/Orbits: Paranasal sinuses and mastoid air cells are clear. The globes and orbits are within normal limits. ASPECTS [facility] Stroke Program Early CT Score) - Ganglionic level infarction (caudate, lentiform nuclei, internal capsule, insula, M1-M3 cortex): 7/7 - Supraganglionic infarction (M4-M6 cortex): 3/3 Total score (0-10 with 10 being normal): 10/10 IMPRESSION: 1. Negative CT of the head 2. ASPECTS is 10/10 Electronically Signed   By: San Morelle M.D.   On: 12/12/2017 15:21    Procedures .Critical Care Performed by: Jola Schmidt, MD Authorized by: Jola Schmidt, MD     CRITICAL CARE Performed by: Jola Schmidt Total critical care time: 33 minutes Critical care time was exclusive of separately billable procedures and treating other patients. Critical care was necessary to treat or prevent imminent or life-threatening deterioration. Critical care was time spent personally by me on the following activities: development of treatment plan with patient and/or surrogate as well as nursing, discussions with consultants, evaluation of patient's response to treatment, examination of patient, obtaining history from patient or surrogate, ordering and performing treatments and interventions, ordering and review of laboratory studies, ordering and review of radiographic studies, pulse oximetry and re-evaluation of patient's condition.   Medications Ordered in ED Medications  naloxone (NARCAN) 2 MG/2ML injection (not administered)  ceFAZolin (ANCEF) IVPB 2g/100 mL premix (2 g Intravenous Given 12/12/17 0726)  acetaminophen (TYLENOL) tablet 1,000 mg (1,000 mg Oral Given 12/12/17 0651)  gabapentin (NEURONTIN) capsule  300 mg (300 mg Oral Given 12/12/17 0086)     Initial Impression / Assessment and Plan / ED Course  I have reviewed the triage vital signs and the nursing notes.  Pertinent labs & imaging results that were available during my care of the patient were reviewed by me and considered in my medical decision making (see chart for details).    Presented with altered mental status and was somewhat unresponsive on arrival.  Throughout her ER stay she has continued to improve from a mental status standpoint.  After MRI she appeared to be moving all 4 extremities and would answer simple questions.  Presently she does have a small amount of pulmonary edema on her chest x-ray and mild hypoxia.  She is requiring 4 L nasal cannula at this time.  Seizure is still a possibility with postictal period.  I do not believe that she is in status at this time as she seems to be improving from a mental status standpoint.  MRI demonstrates no evidence of stroke.  Labs are otherwise unremarkable.  ABG is  unremarkable.  She likely will continue to improve.  She may have had some degree of unwitnessed laryngospasm resulting in pulmonary edema and possible hypercarbia as a cause of her altered mental status at the outpatient surgical center.  Whatever the case may be she seems to be improving.  She will be observed overnight in the hospital.  I suspect she will continue to improve.  Neurology continuing to follow at this time.  If she has another episode like that she may benefit from EEG.  Patient and family updated.   Final Clinical Impressions(s) / ED Diagnoses   Final diagnoses:  Altered mental status, unspecified altered mental status type    ED Discharge Orders    None       Jola Schmidt, MD 12/12/17 2324

## 2017-12-12 NOTE — H&P (Signed)
67 yof referred by Dr Autumn Patty for right breast mass. she has no personal history and only maternal aunt in 66s with breast cancer. she had no mass or dc. she underwent screening mm that shows c density breasts. she had a right breast distortion noted. there are other several adjacent circumscribed lesions that have been stable for some time. US shows small cysts. [place] axilla negative. she underwent stereo core biopsy with result of csl. she has been referred to discuss possible excision   Past Surgical History (Tanisha A. Owens Shark, [place]; 12/01/2017 10:33 AM) Appendectomy  Foot Surgery  Bilateral. Gallbladder Surgery - Laparoscopic  Shoulder Surgery  Bilateral. Spinal Surgery - Neck  Tonsillectomy   Diagnostic Studies History (Tanisha A. Owens Shark, [place]; 12/01/2017 10:33 AM) Colonoscopy  1-5 years ago Mammogram  1-3 years ago  Allergies (Tanisha A. Owens Shark, [place]; 12/01/2017 10:37 AM) Vancomycin HCl *ANTI-INFECTIVE AGENTS - MISC.*  Valium *ANTIANXIETY AGENTS*  Microcyn *DERMATOLOGICALS*  Azithromycin *MACROLIDES*  Doxycycline *DERMATOLOGICALS*  Allergies Reconciled   Medication History (Tanisha A. Owens Shark, RMA; 12/01/2017 10:35 AM) Levothyroxine Sodium (25MCG Tablet, Oral) Active. Sertraline HCl (100MG  Tablet, Oral) Active. TraZODone HCl (100MG  Tablet, Oral) Active.  Social History (Tanisha A. Owens Shark, [place]; 12/01/2017 10:33 AM) Caffeine use  Carbonated beverages. No alcohol use  No drug use  Tobacco use  Never smoker.  Family History (Tanisha A. Owens Shark, [place]; 12/01/2017 10:33 AM) Arthritis  Mother. Breast Cancer  Family Members In General. Depression  Mother. Diabetes Mellitus  Father. Heart Disease  Father. Heart disease in female family member before age 66  Hypertension  Father, Mother. Migraine Headache  Mother. Thyroid problems  Mother.  Pregnancy / Birth History (Tanisha A. Owens Shark, [place]; 12/01/2017 10:33 AM) Age at menarche  42 years. Age of menopause   34-55 Gravida  2 Irregular periods  Maternal age  35-25 Para  2  Other Problems (Tanisha A. Owens Shark, [place]; 12/01/2017 10:33 AM) Anxiety Disorder  Cholelithiasis  General anesthesia - complications  Heart murmur  Hemorrhoids  High blood pressure  Hypercholesterolemia  Migraine Headache  Sleep Apnea  Thyroid Disease  Transfusion history    Review of Systems (Tanisha A. Brown RMA; 12/01/2017 10:33 AM) General Present- Fatigue. Not Present- Appetite Loss, Chills, Fever, Night Sweats, Weight Gain and Weight Loss. Skin Present- Dryness. Not Present- Change in Wart/Mole, Hives, Jaundice, New Lesions, Non-Healing Wounds, Rash and Ulcer. HEENT Not Present- Earache, Hearing Loss, Hoarseness, Nose Bleed, Oral Ulcers, Ringing in the Ears, Seasonal Allergies, Sinus Pain, Sore Throat, Visual Disturbances, Wears glasses/contact lenses and Yellow Eyes. Respiratory Not Present- Bloody sputum, Chronic Cough, Difficulty Breathing, Snoring and Wheezing. Cardiovascular Not Present- Chest Pain, Difficulty Breathing Lying Down, Leg Cramps, Palpitations, Rapid Heart Rate, Shortness of Breath and Swelling of Extremities. Gastrointestinal Present- Hemorrhoids. Not Present- Abdominal Pain, Bloating, Bloody Stool, Change in Bowel Habits, Chronic diarrhea, Constipation, Difficulty Swallowing, Excessive gas, Gets full quickly at meals, Indigestion, Nausea, Rectal Pain and Vomiting. Female Genitourinary Present- Urgency. Not Present- Frequency, Nocturia, Painful Urination and Pelvic Pain. Musculoskeletal Present- Joint Stiffness. Not Present- Back Pain, Joint Pain, Muscle Pain, Muscle Weakness and Swelling of Extremities. Neurological Present- Headaches. Not Present- Decreased Memory, Fainting, Numbness, Seizures, Tingling, Tremor, Trouble walking and Weakness. Psychiatric Present- Anxiety. Not Present- Bipolar, Change in Sleep Pattern, Depression, Fearful and Frequent crying. Endocrine Not Present- Cold  Intolerance, Excessive Hunger, Hair Changes, Heat Intolerance, Hot flashes and New Diabetes. Hematology Not Present- Blood Thinners, Easy Bruising, Excessive bleeding, Gland problems, HIV and Persistent Infections.  Vitals (Tanisha A. Owens Shark RMA;  12/01/2017 10:35 AM) 12/01/2017 10:34 AM Weight: 179.4 lb Height: 66.5in Body Surface Area: 1.92 m Body Mass Index: 28.52 kg/m  Temp.: 98.62F  Pulse: 84 (Regular)  BP: 126/78 (Sitting, Left Arm, Standard) Physical Exam Rolm Bookbinder MD; 12/01/2017 1:40 PM) General Mental Status-Alert. Orientation-Oriented X3.  Head and Neck Trachea-midline.  Eye Sclera/Conjunctiva - Bilateral-No scleral icterus.  Chest and Lung Exam Chest and lung exam reveals -quiet, even and easy respiratory effort with no use of accessory muscles and on auscultation, normal breath sounds, no adventitious sounds and normal vocal resonance.  Breast Nipples-No Discharge. Breast Lump-No Palpable Breast Mass. Note: right breast uiq hematoma   Cardiovascular Cardiovascular examination reveals -normal heart sounds, regular rate and rhythm with no murmurs.  Lymphatic Head & Neck  General Head & Neck Lymphatics: Bilateral - Description - Normal. Axillary  General Axillary Region: Bilateral - Description - Normal. Note: no [place] adenopathy   Assessment & Plan Rolm Bookbinder MD; 12/01/2017 1:43 PM) RADIAL SCAR OF BREAST (N64.89) Story: right breast seed guided lumpectomy we discussed seed guided excision vs observation. she understands there is about a 5% upgrade rate to atypia/dcis etc although this may decrease in future as more are found on tomo. I think safest thing to do (and is her preference) is to excise this area. we discussed seed placement, surgery, risks and recovery. will wait a couple weeks until hematoma resolved  Underwent uncomplicated surgery that went fine and then postop has not been resposnive. Transferred  to [facility].  Awake some now, neuro has seen, ct and mri negative. Will admit on tele, labs pending, follow neuro recs.

## 2017-12-12 NOTE — Anesthesia Procedure Notes (Signed)
Procedure Name: LMA Insertion Date/Time: 12/12/2017 7:34 AM Performed by: Willa Frater, CRNA Pre-anesthesia Checklist: Patient identified, Emergency Drugs available, Suction available and Patient being monitored Patient Re-evaluated:Patient Re-evaluated prior to induction Oxygen Delivery Method: Circle system utilized Preoxygenation: Pre-oxygenation with 100% oxygen Induction Type: IV induction Ventilation: Mask ventilation without difficulty LMA: LMA inserted LMA Size: 4.0 Number of attempts: 1 Airway Equipment and Method: Bite block Placement Confirmation: positive ETCO2 Tube secured with: Tape Dental Injury: Teeth and Oropharynx as per pre-operative assessment

## 2017-12-12 NOTE — Anesthesia Postprocedure Evaluation (Addendum)
Anesthesia Post Note  Patient: Rebecca Steele  Procedure(s) Performed: RIGHT RADIOACTIVE SEED GUIDED EXCISIONAL BREAST BIOPSY ERAS PATHWAY (Right Breast)     Patient location during evaluation: PACU Anesthesia Type: General Level of consciousness: sedated and patient cooperative Pain management: pain level controlled Vital Signs Assessment: post-procedure vital signs reviewed and stable Respiratory status: spontaneous breathing Cardiovascular status: stable Anesthetic complications: no Comments: Called to bedside in PACU for patient being unresponsive. Patients vitals all looked perfect. On no supplemental O2 satting 98%. Pt did not respond to moderately uncomfortable stimulus to left clavicle. Patient did not correct her falling arm from hitting her face. She had brisk lid reflex and her pupils ERR bilaterally. She did appear to look right laterally when I opened her eyelid. We watched her closely, but she had minimal improvement in level of consciousness. I discussed transferring her to [facility] for neurology eval and possible head CT for stroke workup. I gave 0.08 mg of narcan to rule out narcotic over sedation. She quickly began to open her eyes and follow commands, though she seemed sedated still. She was moved to phase 2 where she walked, with assistance, to the bathroom. At some point she became somnolent again. Dr. Lyndle Herrlich was covering her recovery at this point. I discussed her course with Dr. Donne Hazel, who was operating at [facility]. We discussed repeat narcan and potential transfer to [facility], which eventually occurred.     Last Vitals:  Vitals:   12/12/17 1416 12/12/17 1430  BP:    Pulse: 79 84  Resp:    Temp:    SpO2: 96% 97%    Last Pain:  Vitals:   12/12/17 1430  TempSrc:   PainSc: [place]

## 2017-12-12 NOTE — Discharge Instructions (Addendum)
[facility] °Office Phone Number [phone] °POST OP INSTRUCTIONS ° °Always review your discharge instruction sheet given to you by the facility where your surgery was performed. ° °IF YOU HAVE DISABILITY OR FAMILY LEAVE FORMS, YOU MUST BRING THEM TO THE OFFICE FOR PROCESSING.  DO NOT GIVE THEM TO YOUR DOCTOR. ° °1. A prescription for pain medication may be given to you upon discharge.  Take your pain medication as prescribed, if needed.  If narcotic pain medicine is not needed, then you may take acetaminophen (Tylenol), naprosyn (Alleve) or ibuprofen (Advil) as needed. °2. Take your usually prescribed medications unless otherwise directed °3. If you need a refill on your pain medication, please contact your pharmacy.  They will contact our office to request authorization.  Prescriptions will not be filled after 5pm or on week-ends. °4. You should eat very light the first 24 hours after surgery, such as soup, crackers, pudding, etc.  Resume your normal diet the day after surgery. °5. Most patients will experience some swelling and bruising in the breast.  Ice packs and a good support bra will help.  Wear the breast binder provided or a sports bra for 72 hours day and night.  After that wear a sports bra during the day until you return to the office. Swelling and bruising can take several days to resolve.  °6. It is common to experience some constipation if taking pain medication after surgery.  Increasing fluid intake and taking a stool softener will usually help or prevent this problem from occurring.  A mild laxative (Milk of Magnesia or Miralax) should be taken according to package directions if there are no bowel movements after 48 hours. °7. Unless discharge instructions indicate otherwise, you may remove your bandages 48 hours after surgery and you may shower at that time.  You may have steri-strips (small skin tapes) in place directly over the incision.  These strips should be left on the  skin for 7-10 days and will come off on their own.  If your surgeon used skin glue on the incision, you may shower in 24 hours.  The glue will flake off over the next 2-3 weeks.  Any sutures or staples will be removed at the office during your follow-up visit. °8. ACTIVITIES:  You may resume regular daily activities (gradually increasing) beginning the next day.  Wearing a good support bra or sports bra minimizes pain and swelling.  You may have sexual intercourse when it is comfortable. °a. You may drive when you no longer are taking prescription pain medication, you can comfortably wear a seatbelt, and you can safely maneuver your car and apply brakes. °b. RETURN TO WORK:  ______________________________________________________________________________________ °9. You should see your doctor in the office for a follow-up appointment approximately two weeks after your surgery.  Your doctor’s nurse will typically make your follow-up appointment when she calls you with your pathology report.  Expect your pathology report 3-4 business days after your surgery.  You may call to check if you do not hear from us after three days. °10. OTHER INSTRUCTIONS: _______________________________________________________________________________________________ _____________________________________________________________________________________________________________________________________ °_____________________________________________________________________________________________________________________________________ °_____________________________________________________________________________________________________________________________________ ° °WHEN TO CALL DR Alarik Radu: °1. Fever over 101.0 °2. Nausea and/or vomiting. °3. Extreme swelling or bruising. °4. Continued bleeding from incision. °5. Increased pain, redness, or drainage from the incision. ° °The clinic staff is available to answer your questions during regular  business hours.  Please don’t hesitate to call and ask to speak to one of the nurses for clinical concerns.  If you   have a medical emergency, go to the nearest emergency room or call 911.  A surgeon from [facility] is always on call at the hospital. ° °For further questions, please visit [email or web address] ° °

## 2017-12-12 NOTE — Interval H&P Note (Signed)
History and Physical Interval Note:  12/12/2017 7:22 AM  Rebecca Steele  has presented today for surgery, with the diagnosis of right breast mass  The various methods of treatment have been discussed with the patient and family. After consideration of risks, benefits and other options for treatment, the patient has consented to  Procedure(s) with comments: RIGHT RADIOACTIVE SEED GUIDED EXCISIONAL BREAST BIOPSY ERAS PATHWAY (Right) - LMA as a surgical intervention .  The patient's history has been reviewed, patient examined, no change in status, stable for surgery.  I have reviewed the patient's chart and labs.  Questions were answered to the patient's satisfaction.     Rolm Bookbinder

## 2017-12-12 NOTE — ED Notes (Signed)
Pt [facility]. Pt unresponsive after having procedure at day surgery and brought here. Dr Thomasene Lot assessed pt upon arrival and taking pt straight to CT with [facility]. Pt unresponsive.

## 2017-12-12 NOTE — H&P (Signed)
67 yof referred by Dr Autumn Patty for right breast mass. she has no personal history and only maternal aunt in 60s with breast cancer. she had no mass or dc. she underwent screening mm that shows c density breasts. she had a right breast distortion noted. there are other several adjacent circumscribed lesions that have been stable for some time. US shows small cysts. [place] axilla negative. she underwent stereo core biopsy with result of csl. she has been referred to discuss possible excision   Past Surgical History (Tanisha A. Owens Shark, [place]; 12/01/2017 10:33 AM) Appendectomy  Foot Surgery  Bilateral. Gallbladder Surgery - Laparoscopic  Shoulder Surgery  Bilateral. Spinal Surgery - Neck  Tonsillectomy   Diagnostic Studies History (Tanisha A. Owens Shark, [place]; 12/01/2017 10:33 AM) Colonoscopy  1-5 years ago Mammogram  1-3 years ago  Allergies (Tanisha A. Owens Shark, [place]; 12/01/2017 10:37 AM) Vancomycin HCl *ANTI-INFECTIVE AGENTS - MISC.*  Valium *ANTIANXIETY AGENTS*  Microcyn *DERMATOLOGICALS*  Azithromycin *MACROLIDES*  Doxycycline *DERMATOLOGICALS*  Allergies Reconciled   Medication History (Tanisha A. Owens Shark, RMA; 12/01/2017 10:35 AM) Levothyroxine Sodium (25MCG Tablet, Oral) Active. Sertraline HCl (100MG  Tablet, Oral) Active. TraZODone HCl (100MG  Tablet, Oral) Active.  Social History (Tanisha A. Owens Shark, [place]; 12/01/2017 10:33 AM) Caffeine use  Carbonated beverages. No alcohol use  No drug use  Tobacco use  Never smoker.  Family History (Tanisha A. Owens Shark, [place]; 12/01/2017 10:33 AM) Arthritis  Mother. Breast Cancer  Family Members In General. Depression  Mother. Diabetes Mellitus  Father. Heart Disease  Father. Heart disease in female family member before age 20  Hypertension  Father, Mother. Migraine Headache  Mother. Thyroid problems  Mother.  Pregnancy / Birth History (Tanisha A. Owens Shark, [place]; 12/01/2017 10:33 AM) Age at menarche  87 years. Age of menopause   16-55 Gravida  2 Irregular periods  Maternal age  24-25 Para  2  Other Problems (Tanisha A. Owens Shark, [place]; 12/01/2017 10:33 AM) Anxiety Disorder  Cholelithiasis  General anesthesia - complications  Heart murmur  Hemorrhoids  High blood pressure  Hypercholesterolemia  Migraine Headache  Sleep Apnea  Thyroid Disease  Transfusion history    Review of Systems (Tanisha A. Brown RMA; 12/01/2017 10:33 AM) General Present- Fatigue. Not Present- Appetite Loss, Chills, Fever, Night Sweats, Weight Gain and Weight Loss. Skin Present- Dryness. Not Present- Change in Wart/Mole, Hives, Jaundice, New Lesions, Non-Healing Wounds, Rash and Ulcer. HEENT Not Present- Earache, Hearing Loss, Hoarseness, Nose Bleed, Oral Ulcers, Ringing in the Ears, Seasonal Allergies, Sinus Pain, Sore Throat, Visual Disturbances, Wears glasses/contact lenses and Yellow Eyes. Respiratory Not Present- Bloody sputum, Chronic Cough, Difficulty Breathing, Snoring and Wheezing. Cardiovascular Not Present- Chest Pain, Difficulty Breathing Lying Down, Leg Cramps, Palpitations, Rapid Heart Rate, Shortness of Breath and Swelling of Extremities. Gastrointestinal Present- Hemorrhoids. Not Present- Abdominal Pain, Bloating, Bloody Stool, Change in Bowel Habits, Chronic diarrhea, Constipation, Difficulty Swallowing, Excessive gas, Gets full quickly at meals, Indigestion, Nausea, Rectal Pain and Vomiting. Female Genitourinary Present- Urgency. Not Present- Frequency, Nocturia, Painful Urination and Pelvic Pain. Musculoskeletal Present- Joint Stiffness. Not Present- Back Pain, Joint Pain, Muscle Pain, Muscle Weakness and Swelling of Extremities. Neurological Present- Headaches. Not Present- Decreased Memory, Fainting, Numbness, Seizures, Tingling, Tremor, Trouble walking and Weakness. Psychiatric Present- Anxiety. Not Present- Bipolar, Change in Sleep Pattern, Depression, Fearful and Frequent crying. Endocrine Not Present- Cold  Intolerance, Excessive Hunger, Hair Changes, Heat Intolerance, Hot flashes and New Diabetes. Hematology Not Present- Blood Thinners, Easy Bruising, Excessive bleeding, Gland problems, HIV and Persistent Infections.  Vitals (Tanisha A. Owens Shark RMA;  12/01/2017 10:35 AM) 12/01/2017 10:34 AM Weight: 179.4 lb Height: 66.5in Body Surface Area: 1.92 m Body Mass Index: 28.52 kg/m  Temp.: 98.72F  Pulse: 84 (Regular)  BP: 126/78 (Sitting, Left Arm, Standard) Physical Exam Rolm Bookbinder MD; 12/01/2017 1:40 PM) General Mental Status-Alert. Orientation-Oriented X3.  Head and Neck Trachea-midline.  Eye Sclera/Conjunctiva - Bilateral-No scleral icterus.  Chest and Lung Exam Chest and lung exam reveals -quiet, even and easy respiratory effort with no use of accessory muscles and on auscultation, normal breath sounds, no adventitious sounds and normal vocal resonance.  Breast Nipples-No Discharge. Breast Lump-No Palpable Breast Mass. Note: right breast uiq hematoma   Cardiovascular Cardiovascular examination reveals -normal heart sounds, regular rate and rhythm with no murmurs.  Lymphatic Head & Neck  General Head & Neck Lymphatics: Bilateral - Description - Normal. Axillary  General Axillary Region: Bilateral - Description - Normal. Note: no [place] adenopathy   Assessment & Plan Rolm Bookbinder MD; 12/01/2017 1:43 PM) RADIAL SCAR OF BREAST (N64.89) Story: right breast seed guided lumpectomy we discussed seed guided excision vs observation. she understands there is about a 5% upgrade rate to atypia/dcis etc although this may decrease in future as more are found on tomo. I think safest thing to do (and is her preference) is to excise this area. we discussed seed placement, surgery, risks and recovery. will wait a couple weeks until hematoma resolved

## 2017-12-12 NOTE — Transfer of Care (Signed)
Immediate Anesthesia Transfer of Care Note  Patient: Rebecca Steele  Procedure(s) Performed: RIGHT RADIOACTIVE SEED GUIDED EXCISIONAL BREAST BIOPSY ERAS PATHWAY (Right Breast)  Patient Location: PACU  Anesthesia Type:General  Level of Consciousness: awake, alert  and drowsy  Airway & Oxygen Therapy: Patient Spontanous Breathing and Patient connected to face mask oxygen  Post-op Assessment: Report given to RN and Post -op Vital signs reviewed and stable  Post vital signs: Reviewed and stable  Last Vitals:  Vitals:   12/12/17 0850 12/12/17 0851  BP: (!) 142/80   Pulse: 90 92  Resp: 15 19  Temp:    SpO2: 97% 97%    Last Pain:  Vitals:   12/12/17 0642  TempSrc: Oral      Patients Stated Pain Goal: 0 (46/04/79 [id number])  Complications: No apparent anesthesia complications

## 2017-12-13 ENCOUNTER — Observation Stay (HOSPITAL_BASED_OUTPATIENT_CLINIC_OR_DEPARTMENT_OTHER): Payer: Medicare HMO

## 2017-12-13 DIAGNOSIS — N63 Unspecified lump in unspecified breast: Secondary | ICD-10-CM | POA: Diagnosis not present

## 2017-12-13 DIAGNOSIS — I361 Nonrheumatic tricuspid (valve) insufficiency: Secondary | ICD-10-CM | POA: Diagnosis not present

## 2017-12-13 DIAGNOSIS — R69 Illness, unspecified: Secondary | ICD-10-CM | POA: Diagnosis not present

## 2017-12-13 DIAGNOSIS — F419 Anxiety disorder, unspecified: Secondary | ICD-10-CM

## 2017-12-13 DIAGNOSIS — I651 Occlusion and stenosis of basilar artery: Secondary | ICD-10-CM | POA: Diagnosis not present

## 2017-12-13 DIAGNOSIS — F329 Major depressive disorder, single episode, unspecified: Secondary | ICD-10-CM

## 2017-12-13 DIAGNOSIS — E785 Hyperlipidemia, unspecified: Secondary | ICD-10-CM | POA: Diagnosis not present

## 2017-12-13 LAB — RAPID URINE DRUG SCREEN, HOSP PERFORMED
Amphetamines: NOT DETECTED
BARBITURATES: POSITIVE — AB
Benzodiazepines: NOT DETECTED
Cocaine: NOT DETECTED
Opiates: NOT DETECTED
TETRAHYDROCANNABINOL: NOT DETECTED

## 2017-12-13 LAB — HEMOGLOBIN A1C
Hgb A1c MFr Bld: 5.3 % (ref 4.8–5.6)
Mean Plasma Glucose: 105.41 mg/dL

## 2017-12-13 LAB — LIPID PANEL
CHOL/HDL RATIO: 4.4 ratio
Cholesterol: 200 mg/dL (ref 0–200)
HDL: 45 mg/dL (ref >40)
LDL CALC: 137 mg/dL — AB (ref 0–99)
TRIGLYCERIDES: 92 mg/dL (ref <150)
VLDL: 18 mg/dL (ref 0–40)

## 2017-12-13 LAB — ECHOCARDIOGRAM COMPLETE
HEIGHTINCHES: 66 in
Weight: 3030 oz

## 2017-12-13 MED ORDER — LEVOTHYROXINE SODIUM 25 MCG PO TABS
25.0000 ug | ORAL_TABLET | Freq: Every day | ORAL | Status: DC
  Administered 2017-12-14 – 2017-12-15 (×2): 25 ug via ORAL
  Filled 2017-12-13 (×2): qty 1

## 2017-12-13 MED ORDER — ATORVASTATIN CALCIUM 20 MG PO TABS: 20.0000 mg | ORAL_TABLET | Freq: Every day | ORAL | Status: DC

## 2017-12-13 MED ORDER — KETOROLAC TROMETHAMINE 15 MG/ML IJ SOLN
15.0000 mg | Freq: Once | INTRAMUSCULAR | Status: AC
  Administered 2017-12-13: 15 mg via INTRAVENOUS
  Filled 2017-12-13: qty 1

## 2017-12-13 MED ORDER — SERTRALINE HCL 100 MG PO TABS
100.0000 mg | ORAL_TABLET | Freq: Every day | ORAL | Status: DC
  Administered 2017-12-13 – 2017-12-15 (×3): 100 mg via ORAL
  Filled 2017-12-13 (×3): qty 1

## 2017-12-13 NOTE — Progress Notes (Signed)
2D Echocardiogram has been performed.  Rebecca Steele 12/13/2017, 2:15 PM

## 2017-12-13 NOTE — Progress Notes (Signed)
1 Day Post-Op   Subjective/Chief Complaint: A little more awake today, complains of pain left breast, all imaging negative yesterday   Objective: Vital signs in last 24 hours: Temp:  [97.8 F (36.6 C)-98.8 F (37.1 C)] 98.6 F (37 C) (03/13 0525) Pulse Rate:  [67-92] 76 (03/13 0525) Resp:  [10-21] 15 (03/13 0525) BP: (100-142)/(59-84) 122/72 (03/13 0525) SpO2:  [92 %-100 %] 96 % (03/13 0525) Weight:  [85.9 kg (189 lb 6 oz)] 85.9 kg (189 lb 6 oz) (03/13 0529)    Intake/Output from previous day: 03/12 0701 - 03/13 0700 In: 1000 [I.V.:1000] Out: 680 [Urine:600; Blood:80] Intake/Output this shift: Total I/O In: -  Out: 600 [Urine:600]  Lungs clear cv rrr Right breast with hematoma (much of this was there prior to surgery), soft, no infection Neuro- better this am, she mae, follows commands, arouses but still somolent, there is nothing localizing with exam  Lab Results:  Recent Labs    12/12/17 0705 12/12/17 1650  WBC  --  10.6*  HGB 15.6* 15.9*  HCT 46.0 45.4  PLT  --  268   BMET Recent Labs    12/12/17 0705 12/12/17 1650  NA 140 139  K 3.3* 3.5  CL 100* 106  CO2  --  21*  GLUCOSE 96 136*  BUN 14 9  CREATININE 1.00 1.01*  CALCIUM  --  9.2   PT/INR No results for input(s): LABPROT, INR in the last 72 hours. ABG Recent Labs    12/12/17 1722  PHART 7.460*  HCO3 23.9    Studies/Results: Mr [facility] Contrast  Result Date: 12/12/2017 CLINICAL DATA:  Follow-up code stroke. Became unresponsive after lumpectomy today. Symptoms improving. EXAM: MRI HEAD WITHOUT CONTRAST MRA HEAD WITHOUT CONTRAST TECHNIQUE: Axial and coronal diffusion weighted imaging, axial T2 FLAIR obtained without contrast. Angiographic images of the head were obtained using MRA technique without contrast. COMPARISON:  CT HEAD December 12, 2017 at 1459 hours FINDINGS: MRI HEAD FINDINGS INTRACRANIAL CONTENTS: No reduced diffusion to suggest acute ischemia. The ventricles and sulci are normal  for patient's age. No suspicious parenchymal signal, masses, mass effect. No abnormal extra-axial fluid collections. No extra-axial masses. VASCULAR: See below. SKULL AND UPPER CERVICAL SPINE: Nondiagnostic assessment. SINUSES/ORBITS: Included paranasal sinuses and mastoid air cells are well aerated. Ocular globes and orbital contents are non suspicious, gaze to the RIGHT. OTHER: None. MRA HEAD FINDINGS ANTERIOR CIRCULATION: Normal flow related enhancement of the included cervical, petrous, cavernous and supraclinoid internal carotid arteries. Patent anterior communicating artery. Patent anterior and middle cerebral arteries, including distal segments. No large vessel occlusion, flow limiting stenosis, aneurysm. POSTERIOR CIRCULATION: Codominant vertebral arteries. Moderate narrowed mid basilar artery. Basilar artery is patent, with normal flow related enhancement of the main branch vessels. Patent posterior cerebral arteries. No large vessel occlusion, flow limiting stenosis,  aneurysm. ANATOMIC VARIANTS: None. Source images and MIP images were reviewed. IMPRESSION: MRI HEAD: 1. Normal limited noncontrast MRI of the head, stroke protocol. MRA HEAD: 1. No emergent large vessel occlusion or flow limiting stenosis. 2. Moderate stenosis mid basilar artery. Critical Value/emergent results text paged to Dr.ERIC [facility] via AMION secure system on 12/12/2017 at 3:48 pm, including interpreting physician's phone number. Electronically Signed   By: Elon Alas M.D.   On: 12/12/2017 15:50   Mr Brain Wo Contrast  Result Date: 12/12/2017 CLINICAL DATA:  Follow-up code stroke. Became unresponsive after lumpectomy today. Symptoms improving. EXAM: MRI HEAD WITHOUT CONTRAST MRA HEAD WITHOUT CONTRAST TECHNIQUE: Axial and coronal diffusion  weighted imaging, axial T2 FLAIR obtained without contrast. Angiographic images of the head were obtained using MRA technique without contrast. COMPARISON:  CT HEAD December 12, 2017 at 1459  hours FINDINGS: MRI HEAD FINDINGS INTRACRANIAL CONTENTS: No reduced diffusion to suggest acute ischemia. The ventricles and sulci are normal for patient's age. No suspicious parenchymal signal, masses, mass effect. No abnormal extra-axial fluid collections. No extra-axial masses. VASCULAR: See below. SKULL AND UPPER CERVICAL SPINE: Nondiagnostic assessment. SINUSES/ORBITS: Included paranasal sinuses and mastoid air cells are well aerated. Ocular globes and orbital contents are non suspicious, gaze to the RIGHT. OTHER: None. MRA HEAD FINDINGS ANTERIOR CIRCULATION: Normal flow related enhancement of the included cervical, petrous, cavernous and supraclinoid internal carotid arteries. Patent anterior communicating artery. Patent anterior and middle cerebral arteries, including distal segments. No large vessel occlusion, flow limiting stenosis, aneurysm. POSTERIOR CIRCULATION: Codominant vertebral arteries. Moderate narrowed mid basilar artery. Basilar artery is patent, with normal flow related enhancement of the main branch vessels. Patent posterior cerebral arteries. No large vessel occlusion, flow limiting stenosis,  aneurysm. ANATOMIC VARIANTS: None. Source images and MIP images were reviewed. IMPRESSION: MRI HEAD: 1. Normal limited noncontrast MRI of the head, stroke protocol. MRA HEAD: 1. No emergent large vessel occlusion or flow limiting stenosis. 2. Moderate stenosis mid basilar artery. Critical Value/emergent results text paged to Dr.ERIC [facility] via AMION secure system on 12/12/2017 at 3:48 pm, including interpreting physician's phone number. Electronically Signed   By: Elon Alas M.D.   On: 12/12/2017 15:50   Mm Breast Surgical Specimen  Result Date: 12/12/2017 CLINICAL DATA:  Specimen radiograph status post right breast lumpectomy. EXAM: SPECIMEN RADIOGRAPH OF THE RIGHT BREAST COMPARISON:  Previous exam(s). FINDINGS: Status post excision of the right breast. Neither the radioactive seed nor the  biopsy marking clip is seen within the specimen, however the area of distortion does appear to be within the specimen. This was marked for pathology. The radioactive seed is seen separately in a small dish and is completely intact. I do not see the biopsy marking clip within the specimen radiograph nor in the separate small dish with the radioactive seed, however Dr. Donne Hazel believes that this was suctioned out with the patient's hematoma that was present following biopsy. This was communicated to Dr. Donne Hazel in the OR at 8:20 a.m. IMPRESSION: Specimen radiograph of the right breast. Electronically Signed   By: Ammie Ferrier M.D.   On: 12/12/2017 08:26   Dg Chest Portable 1 View  Result Date: 12/12/2017 CLINICAL DATA:  Initial evaluation for acute altered mental status. EXAM: PORTABLE CHEST 1 VIEW COMPARISON:  Prior radiograph from 08/15/2017. FINDINGS: Cardiomegaly.  Mediastinal silhouette normal. Lungs hypoinflated. Probable small bilateral pleural effusions. Diffuse pulmonary and interstitial prominence, compatible pulmonary interstitial edema. Superimposed more confluent opacity at the left lung base may reflect atelectasis or infiltrate. No pneumothorax. No acute osseous abnormality. IMPRESSION: 1. Cardiomegaly with mild diffuse pulmonary interstitial edema and probable small bilateral pleural effusions. 2. Superimposed more confluent left basilar opacity, which may reflect atelectasis and/or infiltrate. Electronically Signed   By: Jeannine Boga M.D.   On: 12/12/2017 17:33   Ct Head Code Stroke Wo Contrast`  Result Date: 12/12/2017 CLINICAL DATA:  Code stroke. Unresponsive following breast surgery, lumpectomy, had surgical center. EXAM: CT HEAD WITHOUT CONTRAST TECHNIQUE: Contiguous axial images were obtained from the base of the skull through the vertex without intravenous contrast. COMPARISON:  None. FINDINGS: Brain: No acute infarct, hemorrhage, or mass lesion is present. The  ventricles are of normal  size. No significant extraaxial fluid collection is present. No significant white matter disease is present. The brainstem and cerebellum are normal. Vascular: Atherosclerotic calcifications are present in the cavernous internal carotid arteries bilaterally. There is no hyperdense vessel. Skull: Calvarium is intact. No focal lytic or blastic lesions are present. Sinuses/Orbits: Paranasal sinuses and mastoid air cells are clear. The globes and orbits are within normal limits. ASPECTS [facility] Stroke Program Early CT Score) - Ganglionic level infarction (caudate, lentiform nuclei, internal capsule, insula, M1-M3 cortex): 7/7 - Supraganglionic infarction (M4-M6 cortex): 3/3 Total score (0-10 with 10 being normal): 10/10 IMPRESSION: 1. Negative CT of the head 2. ASPECTS is 10/10 Electronically Signed   By: San Morelle M.D.   On: 12/12/2017 15:21    Anti-infectives: Anti-infectives (From admission, onward)   Start     Dose/Rate Route Frequency Ordered Stop   12/12/17 0651  ceFAZolin (ANCEF) IVPB 2g/100 mL premix     2 g 200 mL/hr over 30 Minutes Intravenous On call to O.R. 12/12/17 0651 12/12/17 0756      Assessment/Plan: POD 1 right breast biopsy  -she is better today but not really ready for dc - will follow neurology recommendations with tte and carotid US and any other recs Dc pending neuro eval  Rolm Bookbinder 12/13/2017

## 2017-12-13 NOTE — Progress Notes (Signed)
PT Cancellation Note  Patient Details Name: CASSIOPEIA FLORENTINO MRN: [id number] DOB: 16-Oct-1950   Cancelled Treatment:    Reason Eval/Treat Not Completed: Patient at procedure or test/unavailable Pt currently out of room for testing. Will follow up as schedule allows.   Leighton Ruff, PT, DPT  Acute Rehabilitation Services  Pager: [phone]    Rudean Hitt 12/13/2017, 1:48 PM

## 2017-12-13 NOTE — Addendum Note (Signed)
Addendum  created 12/13/17 1044 by Tawni Millers, CRNA   Charge Capture section accepted

## 2017-12-13 NOTE — Care Management Obs Status (Signed)
[place] NOTIFICATION   Patient Details  Name: Rebecca Steele MRN: [id number] Date of Birth: 07-01-1951   Medicare Observation Status Notification Given:  Yes    Erenest Rasher, RN 12/13/2017, 4:54 PM

## 2017-12-13 NOTE — Progress Notes (Signed)
STROKE TEAM PROGRESS NOTE   SUBJECTIVE (INTERVAL HISTORY) Her husband is at the bedside.  Overall she feels her condition is stable. She still sleepy and drowsy but easily arousable and following commands. Participating some exam but lack of effort. Intermittent BLE shaking shivering but able to stop with distraction.    OBJECTIVE Temp:  [98.6 F (37 C)-98.8 F (37.1 C)] 98.6 F (37 C) (03/13 0525) Pulse Rate:  [67-87] 76 (03/13 0525) Cardiac Rhythm: Normal sinus rhythm (03/13 0700) Resp:  [11-21] 15 (03/13 0525) BP: (100-142)/(59-84) 122/72 (03/13 0525) SpO2:  [92 %-99 %] 96 % (03/13 0525) Weight:  [189 lb 6 oz (85.9 kg)] 189 lb 6 oz (85.9 kg) (03/13 0529)  Recent Labs  Lab 12/12/17 1351  GLUCAP 120*   Recent Labs  Lab 12/12/17 0705 12/12/17 1650  NA 140 139  K 3.3* 3.5  CL 100* 106  CO2  --  21*  GLUCOSE 96 136*  BUN 14 9  CREATININE 1.00 1.01*  CALCIUM  --  9.2   Recent Labs  Lab 12/12/17 1650  AST 32  ALT 21  ALKPHOS 70  BILITOT 0.6  PROT 5.9*  ALBUMIN 3.4*   Recent Labs  Lab 12/12/17 0705 12/12/17 1650  WBC  --  10.6*  HGB 15.6* 15.9*  HCT 46.0 45.4  MCV  --  91.0  PLT  --  268   Recent Labs  Lab 12/12/17 1650  TROPONINI <0.03   No results for input(s): LABPROT, INR in the last 72 hours. No results for input(s): COLORURINE, LABSPEC, [place], GLUCOSEU, HGBUR, BILIRUBINUR, KETONESUR, PROTEINUR, UROBILINOGEN, NITRITE, LEUKOCYTESUR in the last 72 hours.  Invalid input(s): APPERANCEUR  No results found for: CHOL, TRIG, HDL, CHOLHDL, VLDL, LDLCALC No results found for: HGBA1C    Component Value Date/Time   LABOPIA NONE DETECTED 12/12/2017 2345   COCAINSCRNUR NONE DETECTED 12/12/2017 2345   LABBENZ NONE DETECTED 12/12/2017 2345   AMPHETMU NONE DETECTED 12/12/2017 2345   THCU NONE DETECTED 12/12/2017 2345   LABBARB POSITIVE (A) 12/12/2017 2345    No results for input(s): ETH in the last 168 hours.  I have personally reviewed the radiological  images below and agree with the radiology interpretations.  Mr [name] Head Wo Contrast  Result Date: 12/12/2017 CLINICAL DATA:  Follow-up code stroke. Became unresponsive after lumpectomy today. Symptoms improving. EXAM: MRI HEAD WITHOUT CONTRAST MRA HEAD WITHOUT CONTRAST TECHNIQUE: Axial and coronal diffusion weighted imaging, axial T2 FLAIR obtained without contrast. Angiographic images of the head were obtained using MRA technique without contrast. COMPARISON:  CT HEAD December 12, 2017 at 1459 hours FINDINGS: MRI HEAD FINDINGS INTRACRANIAL CONTENTS: No reduced diffusion to suggest acute ischemia. The ventricles and sulci are normal for patient's age. No suspicious parenchymal signal, masses, mass effect. No abnormal extra-axial fluid collections. No extra-axial masses. VASCULAR: See below. SKULL AND UPPER CERVICAL SPINE: Nondiagnostic assessment. SINUSES/ORBITS: Included paranasal sinuses and mastoid air cells are well aerated. Ocular globes and orbital contents are non suspicious, gaze to the RIGHT. OTHER: None. MRA HEAD FINDINGS ANTERIOR CIRCULATION: Normal flow related enhancement of the included cervical, petrous, cavernous and supraclinoid internal carotid arteries. Patent anterior communicating artery. Patent anterior and middle cerebral arteries, including distal segments. No large vessel occlusion, flow limiting stenosis, aneurysm. POSTERIOR CIRCULATION: Codominant vertebral arteries. Moderate narrowed mid basilar artery. Basilar artery is patent, with normal flow related enhancement of the main branch vessels. Patent posterior cerebral arteries. No large vessel occlusion, flow limiting stenosis,  aneurysm. ANATOMIC VARIANTS: None. Source  images and MIP images were reviewed. IMPRESSION: MRI HEAD: 1. Normal limited noncontrast MRI of the head, stroke protocol. MRA HEAD: 1. No emergent large vessel occlusion or flow limiting stenosis. 2. Moderate stenosis mid basilar artery. Critical Value/emergent results  text paged to Dr.ERIC [facility] via [employer] secure system on 12/12/2017 at 3:48 pm, including interpreting physician's phone number. Electronically Signed   By: Elon Alas M.D.   On: 12/12/2017 15:50   Mr Brain Wo Contrast  Result Date: 12/12/2017 CLINICAL DATA:  Follow-up code stroke. Became unresponsive after lumpectomy today. Symptoms improving. EXAM: MRI HEAD WITHOUT CONTRAST MRA HEAD WITHOUT CONTRAST TECHNIQUE: Axial and coronal diffusion weighted imaging, axial T2 FLAIR obtained without contrast. Angiographic images of the head were obtained using MRA technique without contrast. COMPARISON:  CT HEAD December 12, 2017 at 1459 hours FINDINGS: MRI HEAD FINDINGS INTRACRANIAL CONTENTS: No reduced diffusion to suggest acute ischemia. The ventricles and sulci are normal for patient's age. No suspicious parenchymal signal, masses, mass effect. No abnormal extra-axial fluid collections. No extra-axial masses. VASCULAR: See below. SKULL AND UPPER CERVICAL SPINE: Nondiagnostic assessment. SINUSES/ORBITS: Included paranasal sinuses and mastoid air cells are well aerated. Ocular globes and orbital contents are non suspicious, gaze to the RIGHT. OTHER: None. MRA HEAD FINDINGS ANTERIOR CIRCULATION: Normal flow related enhancement of the included cervical, petrous, cavernous and supraclinoid internal carotid arteries. Patent anterior communicating artery. Patent anterior and middle cerebral arteries, including distal segments. No large vessel occlusion, flow limiting stenosis, aneurysm. POSTERIOR CIRCULATION: Codominant vertebral arteries. Moderate narrowed mid basilar artery. Basilar artery is patent, with normal flow related enhancement of the main branch vessels. Patent posterior cerebral arteries. No large vessel occlusion, flow limiting stenosis,  aneurysm. ANATOMIC VARIANTS: None. Source images and MIP images were reviewed. IMPRESSION: MRI HEAD: 1. Normal limited noncontrast MRI of the head, stroke protocol. MRA  HEAD: 1. No emergent large vessel occlusion or flow limiting stenosis. 2. Moderate stenosis mid basilar artery. Critical Value/emergent results text paged to Dr.ERIC [facility] via AMION secure system on 12/12/2017 at 3:48 pm, including interpreting physician's phone number. Electronically Signed   By: Elon Alas M.D.   On: 12/12/2017 15:50   Mm Breast Surgical Specimen  Result Date: 12/12/2017 CLINICAL DATA:  Specimen radiograph status post right breast lumpectomy. EXAM: SPECIMEN RADIOGRAPH OF THE RIGHT BREAST COMPARISON:  Previous exam(s). FINDINGS: Status post excision of the right breast. Neither the radioactive seed nor the biopsy marking clip is seen within the specimen, however the area of distortion does appear to be within the specimen. This was marked for pathology. The radioactive seed is seen separately in a small dish and is completely intact. I do not see the biopsy marking clip within the specimen radiograph nor in the separate small dish with the radioactive seed, however Dr. Donne Hazel believes that this was suctioned out with the patient's hematoma that was present following biopsy. This was communicated to Dr. Donne Hazel in the OR at 8:20 a.m. IMPRESSION: Specimen radiograph of the right breast. Electronically Signed   By: Ammie Ferrier M.D.   On: 12/12/2017 08:26   Dg Chest Portable 1 View  Result Date: 12/12/2017 CLINICAL DATA:  Initial evaluation for acute altered mental status. EXAM: PORTABLE CHEST 1 VIEW COMPARISON:  Prior radiograph from 08/15/2017. FINDINGS: Cardiomegaly.  Mediastinal silhouette normal. Lungs hypoinflated. Probable small bilateral pleural effusions. Diffuse pulmonary and interstitial prominence, compatible pulmonary interstitial edema. Superimposed more confluent opacity at the left lung base may reflect atelectasis or infiltrate. No pneumothorax. No acute osseous abnormality.  IMPRESSION: 1. Cardiomegaly with mild diffuse pulmonary interstitial edema and  probable small bilateral pleural effusions. 2. Superimposed more confluent left basilar opacity, which may reflect atelectasis and/or infiltrate. Electronically Signed   By: Jeannine Boga M.D.   On: 12/12/2017 17:33   US Breast Ltd Uni Right Inc Axilla  Result Date: 11/24/2017 CLINICAL DATA:  Screening recall for possible architectural distortion in the right breast. EXAM: DIGITAL DIAGNOSTIC RIGHT MAMMOGRAM WITH CAD AND TOMO ULTRASOUND RIGHT BREAST COMPARISON:  Previous exam(s). ACR Breast Density Category c: The breast tissue is heterogeneously dense, which may obscure small masses. FINDINGS: In the medial aspect of the right breast, a small subtle architectural distortion persists, most evident on the CC view. There is no defined mass within the area of distortion. There are no suspicious calcifications. There are several adjacent circumscribed masses similar to prior mammograms, consistent with benign lesions, likely cysts. Mammographic images were processed with CAD. On physical exam, no mass is palpated in the medial right breast. Targeted ultrasound is performed, showing several small cysts throughout the medial aspect of the right breast, but no solid masses and no convincing sonographic architectural distortion. Sonographic evaluation of the right axilla shows no enlarged or abnormal lymph nodes. IMPRESSION: 1. Suspicious architectural distortion in the medial right breast. Biopsy is indicated. RECOMMENDATION: 3D for stereotactic core needle biopsy of the right breast architectural distortion. I have discussed the findings and recommendations with the patient. Results were also provided in writing at the conclusion of the visit. If applicable, a reminder letter will be sent to the patient regarding the next appointment. BI-RADS CATEGORY  4: Suspicious. Electronically Signed   By: Lajean Manes M.D.   On: 11/24/2017 10:18   Mm Diag Breast Tomo Uni Right  Result Date: 11/24/2017 CLINICAL DATA:   Screening recall for possible architectural distortion in the right breast. EXAM: DIGITAL DIAGNOSTIC RIGHT MAMMOGRAM WITH CAD AND TOMO ULTRASOUND RIGHT BREAST COMPARISON:  Previous exam(s). ACR Breast Density Category c: The breast tissue is heterogeneously dense, which may obscure small masses. FINDINGS: In the medial aspect of the right breast, a small subtle architectural distortion persists, most evident on the CC view. There is no defined mass within the area of distortion. There are no suspicious calcifications. There are several adjacent circumscribed masses similar to prior mammograms, consistent with benign lesions, likely cysts. Mammographic images were processed with CAD. On physical exam, no mass is palpated in the medial right breast. Targeted ultrasound is performed, showing several small cysts throughout the medial aspect of the right breast, but no solid masses and no convincing sonographic architectural distortion. Sonographic evaluation of the right axilla shows no enlarged or abnormal lymph nodes. IMPRESSION: 1. Suspicious architectural distortion in the medial right breast. Biopsy is indicated. RECOMMENDATION: 3D for stereotactic core needle biopsy of the right breast architectural distortion. I have discussed the findings and recommendations with the patient. Results were also provided in writing at the conclusion of the visit. If applicable, a reminder letter will be sent to the patient regarding the next appointment. BI-RADS CATEGORY  4: Suspicious. Electronically Signed   By: Lajean Manes M.D.   On: 11/24/2017 10:18   Mm Screening Breast Tomo Bilateral  Result Date: 11/20/2017 CLINICAL DATA:  Screening. EXAM: DIGITAL SCREENING BILATERAL MAMMOGRAM WITH TOMO AND CAD COMPARISON:  Previous exam(s). ACR Breast Density Category c: The breast tissue is heterogeneously dense, which may obscure small masses. FINDINGS: In the right breast, possible distortion warrants further evaluation. In the  left breast,  no findings suspicious for malignancy. Images were processed with CAD. IMPRESSION: Further evaluation is suggested for possible distortion in the right breast. RECOMMENDATION: Diagnostic mammogram and possibly ultrasound of the right breast. (Code:[id number]) The patient will be contacted regarding the findings, and additional imaging will be scheduled. BI-RADS CATEGORY  0: Incomplete. Need additional imaging evaluation and/or prior mammograms for comparison. Electronically Signed   By: Claudie Revering M.D.   On: 11/20/2017 09:42   Mm Rt Radioactive Seed Loc Mammo Guide  Result Date: 12/07/2017 CLINICAL DATA:  Localize right breast cancer EXAM: MAMMOGRAPHIC GUIDED RADIOACTIVE SEED LOCALIZATION OF THE RIGHT BREAST COMPARISON:  Previous exam(s). FINDINGS: Patient presents for radioactive seed localization prior to surgery. I met with the patient and we discussed the procedure of seed localization including benefits and alternatives. We discussed the high likelihood of a successful procedure. We discussed the risks of the procedure including infection, bleeding, tissue injury and further surgery. We discussed the low dose of radioactivity involved in the procedure. Informed, written consent was given. The usual time-out protocol was performed immediately prior to the procedure. Using mammographic guidance, sterile technique, 1% lidocaine and an I-125 radioactive seed, the right breast cancer was localized using a superior approach. The follow-up mammogram images confirm the seed in the expected location and were marked for the surgeon. Follow-up survey of the patient confirms presence of the radioactive seed. Order number of I-125 seed:  [id number]. Total activity:  9.528 millicuries reference Date: December 04, 2017 The patient tolerated the procedure well and was released from the [facility]. She was given instructions regarding seed removal. IMPRESSION: Radioactive seed localization right breast. No  apparent complications. Electronically Signed   By: Dorise Bullion III M.D   On: 12/07/2017 14:49   Mm Clip Placement Right  Result Date: 11/28/2017 CLINICAL DATA:  Confirmation of clip placement after stereotactic tomosynthesis guided biopsy of screening detected architectural distortion involving the upper inner quadrant of the right breast. EXAM: DIAGNOSTIC RIGHT MAMMOGRAM POST STEREOTACTIC BIOPSY COMPARISON:  Previous exam(s). FINDINGS: Mammographic images were obtained following stereotactic tomosynthesis guided biopsy of this architectural distortion involving the upper inner quadrant of the right breast. The coil shaped tissue marker clip is appropriately positioned within the biopsied architectural distortion. A small hematoma is present at the biopsy site. IMPRESSION: 1. Appropriate positioning of the coil shaped tissue marker clip within the biopsied architectural distortion in the upper inner quadrant of the right breast. 2. Small hematoma at the biopsy site. Final Assessment: Post Procedure Mammograms for Marker Placement Electronically Signed   By: Evangeline Dakin M.D.   On: 11/28/2017 12:15   Mm Rt Breast Bx W Loc Dev 1st Lesion Image Bx Spec Stereo Guide  Addendum Date: 11/29/2017   ADDENDUM REPORT: 11/29/2017 12:40 ADDENDUM: Pathology revealed COMPLEX SCLEROSING LESION, VASCULAR CALCIFICATIONS of Right breast, upper inner quadrant at middle to posterior depth. This was found to be concordant by Dr. Peggye Fothergill, with excision recommended. Pathology results were discussed with the patient by telephone. The patient reported doing well after the biopsy with tenderness and minimal bleeding at the site. Post biopsy instructions and care were reviewed and questions were answered. The patient was encouraged to call [facility] for any additional concerns. Surgical consultation has been arranged with Dr. Rolm Bookbinder at [facility] on December 01, 2017.  Pathology results reported by Roselind Messier, RN on 11/29/2017. Electronically Signed   By: Evangeline Dakin M.D.   On: 11/29/2017 12:40   Result  Date: 11/29/2017 CLINICAL DATA:  Screening detected architectural distortion involving the upper inner quadrant of the right breast at posterior depth without sonographic correlate. EXAM: RIGHT BREAST STEREOTACTIC CORE NEEDLE BIOPSY COMPARISON:  Previous exams. FINDINGS: The patient and I discussed the procedure of stereotactic-guided biopsy including benefits and alternatives. We discussed the high likelihood of a successful procedure. We discussed the risks of the procedure including infection, bleeding, tissue injury, clip migration, and inadequate sampling. Informed written consent was given. The usual time out protocol was performed immediately prior to the procedure. Using sterile technique with chlorhexidine as skin antisepsis, 1% lidocaine and 1% lidocaine with epinephrine as local anesthetic, under stereotactic guidance, a 9 gauge Brevera vacuum assisted device was used to perform core needle biopsy of architectural distortion involving the upper inner quadrant of the right breast using a superior approach. Specimen radiograph was performed showing soft tissue density in all of the core samples. Lesion quadrant: Upper inner quadrant. At the conclusion of the procedure, a coil shaped tissue marker clip was deployed into the biopsy cavity. Follow-up 2-view mammogram was performed and dictated separately. IMPRESSION: Stereotactic-guided biopsy of architectural distortion involving the upper inner quadrant of the right breast. No apparent complications apart from a small hematoma at the biopsy site. Electronically Signed: By: Evangeline Dakin M.D. On: 11/28/2017 12:14   Ct Head Code Stroke Wo Contrast`  Result Date: 12/12/2017 CLINICAL DATA:  Code stroke. Unresponsive following breast surgery, lumpectomy, had surgical center. EXAM: CT HEAD WITHOUT CONTRAST  TECHNIQUE: Contiguous axial images were obtained from the base of the skull through the vertex without intravenous contrast. COMPARISON:  None. FINDINGS: Brain: No acute infarct, hemorrhage, or mass lesion is present. The ventricles are of normal size. No significant extraaxial fluid collection is present. No significant white matter disease is present. The brainstem and cerebellum are normal. Vascular: Atherosclerotic calcifications are present in the cavernous internal carotid arteries bilaterally. There is no hyperdense vessel. Skull: Calvarium is intact. No focal lytic or blastic lesions are present. Sinuses/Orbits: Paranasal sinuses and mastoid air cells are clear. The globes and orbits are within normal limits. ASPECTS [facility] Stroke Program Early CT Score) - Ganglionic level infarction (caudate, lentiform nuclei, internal capsule, insula, M1-M3 cortex): 7/7 - Supraganglionic infarction (M4-M6 cortex): 3/3 Total score (0-10 with 10 being normal): 10/10 IMPRESSION: 1. Negative CT of the head 2. ASPECTS is 10/10 Electronically Signed   By: San Morelle M.D.   On: 12/12/2017 15:21    Carotid Doppler  pending  TTE pending   PHYSICAL EXAM  Temp:  [98.6 F (37 C)-98.8 F (37.1 C)] 98.6 F (37 C) (03/13 0525) Pulse Rate:  [67-87] 76 (03/13 0525) Resp:  [11-21] 15 (03/13 0525) BP: (100-142)/(59-84) 122/72 (03/13 0525) SpO2:  [92 %-99 %] 96 % (03/13 0525) Weight:  [189 lb 6 oz (85.9 kg)] 189 lb 6 oz (85.9 kg) (03/13 0529)  General - Well nourished, well developed, sleepy and drowsy but follows command.  Ophthalmologic - fundi not visualized due to noncooperation.  Cardiovascular - Regular rate and rhythm.  Mental Status -  Level of arousal and orientation to time, place, and person were intact. Language including expression, naming, repetition, comprehension was assessed and found intact, but hypophonia Fund of Knowledge was assessed and was intact.  Cranial Nerves II - XII - II  - Visual field exam not cooperative, with eyelid kept open, pt looking upward with bell's phenomena to avoid exam. III, IV, VI - Extraocular movements intact. V - Facial sensation intact  bilaterally. VII - Facial movement intact bilaterally. VIII - Hearing & vestibular intact bilaterally. X - Palate elevates symmetrically. XI - Chin turning & shoulder shrug intact bilaterally. XII - Tongue protrusion intact.  Motor Strength - The patient's strength was 4/5 in all extremities and pronator drift was absent.  Bulk was normal and fasciculations were absent.   Motor Tone - Muscle tone was assessed at the neck and appendages and was normal.  Reflexes - The patient's reflexes were symmetrical in all extremities and she had no pathological reflexes.  Sensory - Light touch, temperature/pinprick were assessed and were symmetrical.    Coordination - The patient had normal movements in the hands with no ataxia or dysmetria, but with slow motion. BLE intermittent shaking/shivering but able to stop with distraction.  Gait and Station - deferred.   ASSESSMENT/PLAN Rebecca Steele is a 67 y.o. female with history of HTN, mild OSA, s/p right breast lumpectomy due to breast mass admitted for AMS post surgery. No tPA given due to no focal deficit.    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 Yes; Fluid consistency: Thin   No antithrombotic prior to admission, now on aspirin 81 mg daily. Continue ASA On discharge  Patient counseled to be compliant with her  antithrombotic medications  Ongoing aggressive stroke risk factor management  Therapy recommendations:  pending  Disposition:  pending  Hypertension Stable without BP meds Avoid low BP and dehydration due to BA stenosis Hold off BP meds now and recommend check BP at home with close monitoring  Long term BP goal 120-140   Hyperlipidemia  Home meds:  none   LDL pending, goal < 70  Now on lipitor 20  Continue statin at discharge  Other Stroke Risk Factors  Advanced age  Obesity, Body mass index is 30.57 kg/m.   Obstructive sleep apnea  Other Active Problems  Depression - continue zoloft  Hypothyroidism - continue synthroid  Hospital day # 0    Rebecca Hawking, MD PhD Stroke Neurology 12/13/2017 10:38 AM    To contact Stroke Continuity provider, please refer to [email or web address] After hours, contact General Neurology

## 2017-12-13 NOTE — Progress Notes (Signed)
Carotid duplex prelim: 1-39% ICA stenosis.  Antwane Grose Eunice, RDMS, RVT   

## 2017-12-13 NOTE — Evaluation (Signed)
Occupational Therapy Evaluation Patient Details Name: Rebecca Steele MRN: [id number] DOB: 04-12-1951 Today's Date: 12/13/2017    History of Present Illness This 67 year old female was admitted to the ED from the surgical center s/p AMS/not fully waking up after R lumpectomy.  PMH:  mitral valve prolapse and depression   Clinical Impression   Pt was admitted for the above.  She is usually independent with adls/iadls.  Pt awake and cooperative during OT evaluation.  She did c/o spinning sensation and husband reports that glasses may not be aligned correctly.  This improved when she closed one eye. She got up to chair with min A.  Will plan to follow in acute setting with min guard to min A level goals.    Follow Up Recommendations  Supervision/Assistance - 24 hour;Home health OT(depending upon progress)    Equipment Recommendations  (to be further assessed) Pt has a high commode at home with something to push from on L side. She also has a shower seat   Recommendations for Other Services       Precautions / Restrictions Precautions Precautions: Fall Precaution Comments: R breast surgery Restrictions Weight Bearing Restrictions: No      Mobility Bed Mobility               General bed mobility comments: min A for OOB; HOB raised  Transfers Overall transfer level: Needs assistance Equipment used: 1 person hand held assist Transfers: Sit to/from [facility] Sit to Stand: Min assist Stand pivot transfers: Min assist       General transfer comment: light balancing assistance    Balance                                           ADL either performed or assessed with clinical judgement   ADL Overall ADL's : Needs assistance/impaired Eating/Feeding: Set up;Sitting   Grooming: Brushing hair;Oral care;Sitting;Set up   Upper Body Bathing: Minimal assistance;Sitting   Lower Body Bathing: Maximal assistance;Sit to/from stand    Upper Body Dressing : Moderate assistance;Sitting   Lower Body Dressing: Maximal assistance;Sit to/from stand   Toilet Transfer: Minimal assistance;Stand-pivot(hand held assistance to chair)             General ADL Comments: pt sat EOB for several minutes before getting up to chair. Hand held assistance given.  Pt c/o spinning when sitting up.  Husband had popped progressive lens back into glasses and it may be malaligned:  pt reported less discomfort when one eye was closed     Vision   Additional Comments: see ADL comments     Perception     Praxis      Pertinent Vitals/Pain Pain Assessment: Faces Faces Pain Scale: Hurts even more Pain Location: R breast Pain Descriptors / Indicators: Sore Pain Intervention(s): Limited activity within patient's tolerance;Monitored during session;Repositioned     Hand Dominance     Extremity/Trunk Assessment Upper Extremity Assessment Upper Extremity Assessment: Generalized weakness;RUE deficits/detail RUE Deficits / Details: able to lift arm to approximately 45 degrees without pain           Communication Communication Communication: No difficulties   Cognition Arousal/Alertness: Awake/alert(eyes mostly opened) Behavior During Therapy: WFL for tasks assessed/performed Overall Cognitive Status: Within Functional Limits for tasks assessed  General Comments  pt had tremors throughout body when she first got up to chair.  Husband reports that this sometimes happens with anesthesia/medication.  Stopped after a couple of minutes    Exercises     Shoulder Instructions      Home Living Family/patient expects to be discharged to:: Private residence Living Arrangements: Spouse/significant other Available Help at Discharge: Family               Bathroom Shower/Tub: Tub/shower unit;Walk-in shower   Bathroom Toilet: Handicapped height     Home Equipment: Shower seat           Prior Functioning/Environment Level of Independence: Independent                 OT Problem List: Decreased strength;Decreased activity tolerance;Impaired balance (sitting and/or standing);Pain;Decreased knowledge of use of DME or AE      OT Treatment/Interventions: Self-care/ADL training;DME and/or AE instruction;Patient/family education;Balance training;Therapeutic activities    OT Goals(Current goals can be found in the care plan section) Acute Rehab OT Goals Patient Stated Goal: return to independence OT Goal Formulation: With patient Time For Goal Achievement: 12/27/17 Potential to Achieve Goals: Good ADL Goals Pt Will Perform Grooming: with min guard assist;standing Pt Will Transfer to Toilet: with min guard assist;ambulating(high commode) Pt Will Perform Toileting - Clothing Manipulation and hygiene: with min guard assist;sit to/from stand Additional ADL Goal #1: pt will complete adl with set up/ and min A sit to stand Additional ADL Goal #2: pt will be independent with bed mobility in preparation for adls  OT Frequency: Min 2X/week   Barriers to D/C:            Co-evaluation              AM-PAC PT "6 Clicks" Daily Activity     Outcome Measure Help from another person eating meals?: A Little Help from another person taking care of personal grooming?: A Little Help from another person toileting, which includes using toliet, bedpan, or urinal?: A Lot Help from another person bathing (including washing, rinsing, drying)?: A Lot Help from another person to put on and taking off regular upper body clothing?: A Lot Help from another person to put on and taking off regular lower body clothing?: A Lot 6 Click Score: 14   End of Session Nurse Communication: Mobility status  Activity Tolerance: Patient limited by fatigue Patient left: in chair;with call bell/phone within reach;with family/visitor present  OT Visit Diagnosis: Muscle weakness (generalized)  (M62.81)                Time: 7564-3329 OT Time Calculation (min): 31 min Charges:  OT General Charges $OT Visit: 1 Visit OT Evaluation $OT Eval Low Complexity: 1 Low OT Treatments $Therapeutic Activity: 8-22 mins G-Codes:     Rebecca Steele [phone] 12/13/2017  Rebecca Steele 12/13/2017, 4:19 PM

## 2017-12-14 ENCOUNTER — Encounter (HOSPITAL_BASED_OUTPATIENT_CLINIC_OR_DEPARTMENT_OTHER): Payer: Self-pay | Admitting: General Surgery

## 2017-12-14 DIAGNOSIS — I651 Occlusion and stenosis of basilar artery: Secondary | ICD-10-CM

## 2017-12-14 DIAGNOSIS — E785 Hyperlipidemia, unspecified: Secondary | ICD-10-CM

## 2017-12-14 DIAGNOSIS — F419 Anxiety disorder, unspecified: Secondary | ICD-10-CM | POA: Diagnosis not present

## 2017-12-14 DIAGNOSIS — R69 Illness, unspecified: Secondary | ICD-10-CM | POA: Diagnosis not present

## 2017-12-14 DIAGNOSIS — F329 Major depressive disorder, single episode, unspecified: Secondary | ICD-10-CM | POA: Diagnosis not present

## 2017-12-14 DIAGNOSIS — N63 Unspecified lump in unspecified breast: Secondary | ICD-10-CM | POA: Diagnosis not present

## 2017-12-14 MED ORDER — ATORVASTATIN CALCIUM 40 MG PO TABS
40.0000 mg | ORAL_TABLET | Freq: Every day | ORAL | Status: DC
  Filled 2017-12-14: qty 1

## 2017-12-14 NOTE — Progress Notes (Signed)
STROKE TEAM PROGRESS NOTE   SUBJECTIVE (INTERVAL HISTORY) Her husband and PT are at the bedside. She is sitting in chair, awake alert conversing well. Still mild depressed mood.     OBJECTIVE Temp:  [97.9 F (36.6 C)-98.8 F (37.1 C)] 97.9 F (36.6 C) (03/14 0800) Pulse Rate:  [69-86] 69 (03/14 0800) Cardiac Rhythm: Normal sinus rhythm (03/14 0810) Resp:  [12-18] 12 (03/14 0800) BP: (120-151)/(66-88) 141/87 (03/14 0800) SpO2:  [96 %-97 %] 97 % (03/14 0800)  Recent Labs  Lab 12/12/17 1351  GLUCAP 120*   Recent Labs  Lab 12/12/17 0705 12/12/17 1650  NA 140 139  K 3.3* 3.5  CL 100* 106  CO2  --  21*  GLUCOSE 96 136*  BUN 14 9  CREATININE 1.00 1.01*  CALCIUM  --  9.2   Recent Labs  Lab 12/12/17 1650  AST 32  ALT 21  ALKPHOS 70  BILITOT 0.6  PROT 5.9*  ALBUMIN 3.4*   Recent Labs  Lab 12/12/17 0705 12/12/17 1650  WBC  --  10.6*  HGB 15.6* 15.9*  HCT 46.0 45.4  MCV  --  91.0  PLT  --  268   Recent Labs  Lab 12/12/17 1650  TROPONINI <0.03   No results for input(s): LABPROT, INR in the last 72 hours. No results for input(s): COLORURINE, LABSPEC, [place], GLUCOSEU, HGBUR, BILIRUBINUR, KETONESUR, PROTEINUR, UROBILINOGEN, NITRITE, LEUKOCYTESUR in the last 72 hours.  Invalid input(s): APPERANCEUR     Component Value Date/Time   CHOL 200 12/13/2017 1034   TRIG 92 12/13/2017 1034   HDL 45 12/13/2017 1034   CHOLHDL 4.4 12/13/2017 1034   VLDL 18 12/13/2017 1034   LDLCALC 137 (H) 12/13/2017 1034   Lab Results  Component Value Date   HGBA1C 5.3 12/13/2017      Component Value Date/Time   LABOPIA NONE DETECTED 12/12/2017 2345   COCAINSCRNUR NONE DETECTED 12/12/2017 2345   LABBENZ NONE DETECTED 12/12/2017 2345   AMPHETMU NONE DETECTED 12/12/2017 2345   THCU NONE DETECTED 12/12/2017 2345   LABBARB POSITIVE (A) 12/12/2017 2345    No results for input(s): ETH in the last 168 hours.  I have personally reviewed the radiological images below and agree with  the radiology interpretations.  Mr [name] Head Wo Contrast  Result Date: 12/12/2017 CLINICAL DATA:  Follow-up code stroke. Became unresponsive after lumpectomy today. Symptoms improving. EXAM: MRI HEAD WITHOUT CONTRAST MRA HEAD WITHOUT CONTRAST TECHNIQUE: Axial and coronal diffusion weighted imaging, axial T2 FLAIR obtained without contrast. Angiographic images of the head were obtained using MRA technique without contrast. COMPARISON:  CT HEAD December 12, 2017 at 1459 hours FINDINGS: MRI HEAD FINDINGS INTRACRANIAL CONTENTS: No reduced diffusion to suggest acute ischemia. The ventricles and sulci are normal for patient's age. No suspicious parenchymal signal, masses, mass effect. No abnormal extra-axial fluid collections. No extra-axial masses. VASCULAR: See below. SKULL AND UPPER CERVICAL SPINE: Nondiagnostic assessment. SINUSES/ORBITS: Included paranasal sinuses and mastoid air cells are well aerated. Ocular globes and orbital contents are non suspicious, gaze to the RIGHT. OTHER: None. MRA HEAD FINDINGS ANTERIOR CIRCULATION: Normal flow related enhancement of the included cervical, petrous, cavernous and supraclinoid internal carotid arteries. Patent anterior communicating artery. Patent anterior and middle cerebral arteries, including distal segments. No large vessel occlusion, flow limiting stenosis, aneurysm. POSTERIOR CIRCULATION: Codominant vertebral arteries. Moderate narrowed mid basilar artery. Basilar artery is patent, with normal flow related enhancement of the main branch vessels. Patent posterior cerebral arteries. No large vessel occlusion, flow limiting  stenosis,  aneurysm. ANATOMIC VARIANTS: None. Source images and MIP images were reviewed. IMPRESSION: MRI HEAD: 1. Normal limited noncontrast MRI of the head, stroke protocol. MRA HEAD: 1. No emergent large vessel occlusion or flow limiting stenosis. 2. Moderate stenosis mid basilar artery. Critical Value/emergent results text paged to Dr.ERIC [facility]  via AMION secure system on 12/12/2017 at 3:48 pm, including interpreting physician's phone number. Electronically Signed   By: Elon Alas M.D.   On: 12/12/2017 15:50   Mr Brain Wo Contrast  Result Date: 12/12/2017 CLINICAL DATA:  Follow-up code stroke. Became unresponsive after lumpectomy today. Symptoms improving. EXAM: MRI HEAD WITHOUT CONTRAST MRA HEAD WITHOUT CONTRAST TECHNIQUE: Axial and coronal diffusion weighted imaging, axial T2 FLAIR obtained without contrast. Angiographic images of the head were obtained using MRA technique without contrast. COMPARISON:  CT HEAD December 12, 2017 at 1459 hours FINDINGS: MRI HEAD FINDINGS INTRACRANIAL CONTENTS: No reduced diffusion to suggest acute ischemia. The ventricles and sulci are normal for patient's age. No suspicious parenchymal signal, masses, mass effect. No abnormal extra-axial fluid collections. No extra-axial masses. VASCULAR: See below. SKULL AND UPPER CERVICAL SPINE: Nondiagnostic assessment. SINUSES/ORBITS: Included paranasal sinuses and mastoid air cells are well aerated. Ocular globes and orbital contents are non suspicious, gaze to the RIGHT. OTHER: None. MRA HEAD FINDINGS ANTERIOR CIRCULATION: Normal flow related enhancement of the included cervical, petrous, cavernous and supraclinoid internal carotid arteries. Patent anterior communicating artery. Patent anterior and middle cerebral arteries, including distal segments. No large vessel occlusion, flow limiting stenosis, aneurysm. POSTERIOR CIRCULATION: Codominant vertebral arteries. Moderate narrowed mid basilar artery. Basilar artery is patent, with normal flow related enhancement of the main branch vessels. Patent posterior cerebral arteries. No large vessel occlusion, flow limiting stenosis,  aneurysm. ANATOMIC VARIANTS: None. Source images and MIP images were reviewed. IMPRESSION: MRI HEAD: 1. Normal limited noncontrast MRI of the head, stroke protocol. MRA HEAD: 1. No emergent large vessel  occlusion or flow limiting stenosis. 2. Moderate stenosis mid basilar artery. Critical Value/emergent results text paged to Dr.ERIC [facility] via AMION secure system on 12/12/2017 at 3:48 pm, including interpreting physician's phone number. Electronically Signed   By: Elon Alas M.D.   On: 12/12/2017 15:50   Mm Breast Surgical Specimen  Result Date: 12/12/2017 CLINICAL DATA:  Specimen radiograph status post right breast lumpectomy. EXAM: SPECIMEN RADIOGRAPH OF THE RIGHT BREAST COMPARISON:  Previous exam(s). FINDINGS: Status post excision of the right breast. Neither the radioactive seed nor the biopsy marking clip is seen within the specimen, however the area of distortion does appear to be within the specimen. This was marked for pathology. The radioactive seed is seen separately in a small dish and is completely intact. I do not see the biopsy marking clip within the specimen radiograph nor in the separate small dish with the radioactive seed, however Dr. Donne Hazel believes that this was suctioned out with the patient's hematoma that was present following biopsy. This was communicated to Dr. Donne Hazel in the OR at 8:20 a.m. IMPRESSION: Specimen radiograph of the right breast. Electronically Signed   By: Ammie Ferrier M.D.   On: 12/12/2017 08:26   Dg Chest Portable 1 View  Result Date: 12/12/2017 CLINICAL DATA:  Initial evaluation for acute altered mental status. EXAM: PORTABLE CHEST 1 VIEW COMPARISON:  Prior radiograph from 08/15/2017. FINDINGS: Cardiomegaly.  Mediastinal silhouette normal. Lungs hypoinflated. Probable small bilateral pleural effusions. Diffuse pulmonary and interstitial prominence, compatible pulmonary interstitial edema. Superimposed more confluent opacity at the left lung base may reflect atelectasis or  infiltrate. No pneumothorax. No acute osseous abnormality. IMPRESSION: 1. Cardiomegaly with mild diffuse pulmonary interstitial edema and probable small bilateral pleural  effusions. 2. Superimposed more confluent left basilar opacity, which may reflect atelectasis and/or infiltrate. Electronically Signed   By: Jeannine Boga M.D.   On: 12/12/2017 17:33   US Breast Ltd Uni Right Inc Axilla  Result Date: 11/24/2017 CLINICAL DATA:  Screening recall for possible architectural distortion in the right breast. EXAM: DIGITAL DIAGNOSTIC RIGHT MAMMOGRAM WITH CAD AND TOMO ULTRASOUND RIGHT BREAST COMPARISON:  Previous exam(s). ACR Breast Density Category c: The breast tissue is heterogeneously dense, which may obscure small masses. FINDINGS: In the medial aspect of the right breast, a small subtle architectural distortion persists, most evident on the CC view. There is no defined mass within the area of distortion. There are no suspicious calcifications. There are several adjacent circumscribed masses similar to prior mammograms, consistent with benign lesions, likely cysts. Mammographic images were processed with CAD. On physical exam, no mass is palpated in the medial right breast. Targeted ultrasound is performed, showing several small cysts throughout the medial aspect of the right breast, but no solid masses and no convincing sonographic architectural distortion. Sonographic evaluation of the right axilla shows no enlarged or abnormal lymph nodes. IMPRESSION: 1. Suspicious architectural distortion in the medial right breast. Biopsy is indicated. RECOMMENDATION: 3D for stereotactic core needle biopsy of the right breast architectural distortion. I have discussed the findings and recommendations with the patient. Results were also provided in writing at the conclusion of the visit. If applicable, a reminder letter will be sent to the patient regarding the next appointment. BI-RADS CATEGORY  4: Suspicious. Electronically Signed   By: Lajean Manes M.D.   On: 11/24/2017 10:18   Mm Diag Breast Tomo Uni Right  Result Date: 11/24/2017 CLINICAL DATA:  Screening recall for possible  architectural distortion in the right breast. EXAM: DIGITAL DIAGNOSTIC RIGHT MAMMOGRAM WITH CAD AND TOMO ULTRASOUND RIGHT BREAST COMPARISON:  Previous exam(s). ACR Breast Density Category c: The breast tissue is heterogeneously dense, which may obscure small masses. FINDINGS: In the medial aspect of the right breast, a small subtle architectural distortion persists, most evident on the CC view. There is no defined mass within the area of distortion. There are no suspicious calcifications. There are several adjacent circumscribed masses similar to prior mammograms, consistent with benign lesions, likely cysts. Mammographic images were processed with CAD. On physical exam, no mass is palpated in the medial right breast. Targeted ultrasound is performed, showing several small cysts throughout the medial aspect of the right breast, but no solid masses and no convincing sonographic architectural distortion. Sonographic evaluation of the right axilla shows no enlarged or abnormal lymph nodes. IMPRESSION: 1. Suspicious architectural distortion in the medial right breast. Biopsy is indicated. RECOMMENDATION: 3D for stereotactic core needle biopsy of the right breast architectural distortion. I have discussed the findings and recommendations with the patient. Results were also provided in writing at the conclusion of the visit. If applicable, a reminder letter will be sent to the patient regarding the next appointment. BI-RADS CATEGORY  4: Suspicious. Electronically Signed   By: Lajean Manes M.D.   On: 11/24/2017 10:18   Mm Screening Breast Tomo Bilateral  Result Date: 11/20/2017 CLINICAL DATA:  Screening. EXAM: DIGITAL SCREENING BILATERAL MAMMOGRAM WITH TOMO AND CAD COMPARISON:  Previous exam(s). ACR Breast Density Category c: The breast tissue is heterogeneously dense, which may obscure small masses. FINDINGS: In the right breast, possible distortion warrants  further evaluation. In the left breast, no findings  suspicious for malignancy. Images were processed with CAD. IMPRESSION: Further evaluation is suggested for possible distortion in the right breast. RECOMMENDATION: Diagnostic mammogram and possibly ultrasound of the right breast. (Code:[id number]) The patient will be contacted regarding the findings, and additional imaging will be scheduled. BI-RADS CATEGORY  0: Incomplete. Need additional imaging evaluation and/or prior mammograms for comparison. Electronically Signed   By: Claudie Revering M.D.   On: 11/20/2017 09:42   Mm Rt Radioactive Seed Loc Mammo Guide  Result Date: 12/07/2017 CLINICAL DATA:  Localize right breast cancer EXAM: MAMMOGRAPHIC GUIDED RADIOACTIVE SEED LOCALIZATION OF THE RIGHT BREAST COMPARISON:  Previous exam(s). FINDINGS: Patient presents for radioactive seed localization prior to surgery. I met with the patient and we discussed the procedure of seed localization including benefits and alternatives. We discussed the high likelihood of a successful procedure. We discussed the risks of the procedure including infection, bleeding, tissue injury and further surgery. We discussed the low dose of radioactivity involved in the procedure. Informed, written consent was given. The usual time-out protocol was performed immediately prior to the procedure. Using mammographic guidance, sterile technique, 1% lidocaine and an I-125 radioactive seed, the right breast cancer was localized using a superior approach. The follow-up mammogram images confirm the seed in the expected location and were marked for the surgeon. Follow-up survey of the patient confirms presence of the radioactive seed. Order number of I-125 seed:  [id number]. Total activity:  1.497 millicuries reference Date: December 04, 2017 The patient tolerated the procedure well and was released from the [place]. She was given instructions regarding seed removal. IMPRESSION: Radioactive seed localization right breast. No apparent complications.  Electronically Signed   By: Dorise Bullion III M.D   On: 12/07/2017 14:49   Mm Clip Placement Right  Result Date: 11/28/2017 CLINICAL DATA:  Confirmation of clip placement after stereotactic tomosynthesis guided biopsy of screening detected architectural distortion involving the upper inner quadrant of the right breast. EXAM: DIAGNOSTIC RIGHT MAMMOGRAM POST STEREOTACTIC BIOPSY COMPARISON:  Previous exam(s). FINDINGS: Mammographic images were obtained following stereotactic tomosynthesis guided biopsy of this architectural distortion involving the upper inner quadrant of the right breast. The coil shaped tissue marker clip is appropriately positioned within the biopsied architectural distortion. A small hematoma is present at the biopsy site. IMPRESSION: 1. Appropriate positioning of the coil shaped tissue marker clip within the biopsied architectural distortion in the upper inner quadrant of the right breast. 2. Small hematoma at the biopsy site. Final Assessment: Post Procedure Mammograms for Marker Placement Electronically Signed   By: Evangeline Dakin M.D.   On: 11/28/2017 12:15   Mm Rt Breast Bx W Loc Dev 1st Lesion Image Bx Spec Stereo Guide  Addendum Date: 11/29/2017   ADDENDUM REPORT: 11/29/2017 12:40 ADDENDUM: Pathology revealed COMPLEX SCLEROSING LESION, VASCULAR CALCIFICATIONS of Right breast, upper inner quadrant at middle to posterior depth. This was found to be concordant by Dr. Peggye Fothergill, with excision recommended. Pathology results were discussed with the patient by telephone. The patient reported doing well after the biopsy with tenderness and minimal bleeding at the site. Post biopsy instructions and care were reviewed and questions were answered. The patient was encouraged to call [facility] for any additional concerns. Surgical consultation has been arranged with Dr. Rolm Bookbinder at [facility] on December 01, 2017. Pathology results  reported by Roselind Messier, RN on 11/29/2017. Electronically Signed   By: Sherran Needs.D.  On: 11/29/2017 12:40   Result Date: 11/29/2017 CLINICAL DATA:  Screening detected architectural distortion involving the upper inner quadrant of the right breast at posterior depth without sonographic correlate. EXAM: RIGHT BREAST STEREOTACTIC CORE NEEDLE BIOPSY COMPARISON:  Previous exams. FINDINGS: The patient and I discussed the procedure of stereotactic-guided biopsy including benefits and alternatives. We discussed the high likelihood of a successful procedure. We discussed the risks of the procedure including infection, bleeding, tissue injury, clip migration, and inadequate sampling. Informed written consent was given. The usual time out protocol was performed immediately prior to the procedure. Using sterile technique with chlorhexidine as skin antisepsis, 1% lidocaine and 1% lidocaine with epinephrine as local anesthetic, under stereotactic guidance, a 9 gauge Brevera vacuum assisted device was used to perform core needle biopsy of architectural distortion involving the upper inner quadrant of the right breast using a superior approach. Specimen radiograph was performed showing soft tissue density in all of the core samples. Lesion quadrant: Upper inner quadrant. At the conclusion of the procedure, a coil shaped tissue marker clip was deployed into the biopsy cavity. Follow-up 2-view mammogram was performed and dictated separately. IMPRESSION: Stereotactic-guided biopsy of architectural distortion involving the upper inner quadrant of the right breast. No apparent complications apart from a small hematoma at the biopsy site. Electronically Signed: By: Evangeline Dakin M.D. On: 11/28/2017 12:14   Ct Head Code Stroke Wo Contrast`  Result Date: 12/12/2017 CLINICAL DATA:  Code stroke. Unresponsive following breast surgery, lumpectomy, had surgical center. EXAM: CT HEAD WITHOUT CONTRAST TECHNIQUE: Contiguous axial  images were obtained from the base of the skull through the vertex without intravenous contrast. COMPARISON:  None. FINDINGS: Brain: No acute infarct, hemorrhage, or mass lesion is present. The ventricles are of normal size. No significant extraaxial fluid collection is present. No significant white matter disease is present. The brainstem and cerebellum are normal. Vascular: Atherosclerotic calcifications are present in the cavernous internal carotid arteries bilaterally. There is no hyperdense vessel. Skull: Calvarium is intact. No focal lytic or blastic lesions are present. Sinuses/Orbits: Paranasal sinuses and mastoid air cells are clear. The globes and orbits are within normal limits. ASPECTS [facility] Stroke Program Early CT Score) - Ganglionic level infarction (caudate, lentiform nuclei, internal capsule, insula, M1-M3 cortex): 7/7 - Supraganglionic infarction (M4-M6 cortex): 3/3 Total score (0-10 with 10 being normal): 10/10 IMPRESSION: 1. Negative CT of the head 2. ASPECTS is 10/10 Electronically Signed   By: San Morelle M.D.   On: 12/12/2017 15:21    Carotid Doppler  1-39% ICA stenosis  TTE - LVEF 55-60%, normal wall thickness, normal wall motion, grade 1   DD, indeterminate LV filling pressure, trivial MR, normal LA   size, mild TR, RVSP 33 mmHg, normal IVC.   PHYSICAL EXAM  Temp:  [97.9 F (36.6 C)-98.8 F (37.1 C)] 97.9 F (36.6 C) (03/14 0800) Pulse Rate:  [69-86] 69 (03/14 0800) Resp:  [12-18] 12 (03/14 0800) BP: (120-151)/(66-88) 141/87 (03/14 0800) SpO2:  [96 %-97 %] 97 % (03/14 0800)  General - Well nourished, well developed, sleepy and drowsy but follows command.  Ophthalmologic - fundi not visualized due to noncooperation.  Cardiovascular - Regular rate and rhythm.  Mental Status -  Level of arousal and orientation to time, place, and person were intact. Language including expression, naming, repetition, comprehension was assessed and found intact, but  hypophonia Fund of Knowledge was assessed and was intact.  Cranial Nerves II - XII - II - Visual field exam intact. III, IV, VI -  Extraocular movements intact. V - Facial sensation intact bilaterally. VII - Facial movement intact bilaterally. VIII - Hearing & vestibular intact bilaterally. X - Palate elevates symmetrically. XI - Chin turning & shoulder shrug intact bilaterally. XII - Tongue protrusion intact.  Motor Strength - The patient's strength was 4/5 in all extremities and pronator drift was absent.  Bulk was normal and fasciculations were absent.   Motor Tone - Muscle tone was assessed at the neck and appendages and was normal.  Reflexes - The patient's reflexes were symmetrical in all extremities and she had no pathological reflexes.  Sensory - Light touch, temperature/pinprick were assessed and were symmetrical.    Coordination - The patient had normal movements in the hands with no ataxia or dysmetria.  Gait and Station - deferred to PT.   ASSESSMENT/PLAN Rebecca Steele is a 67 y.o. female with history of HTN, mild OSA, s/p right breast lumpectomy due to breast mass admitted for AMS post surgery. No tPA given due to no focal deficit.    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 Yes; Fluid consistency: Thin   No antithrombotic prior to admission, now on aspirin 81 mg daily. Continue ASA on discharge  Patient counseled to be compliant with her antithrombotic medications  Ongoing aggressive stroke risk factor management  Therapy recommendations:  pending  Disposition:   pending  Hypertension Stable without BP meds Avoid low BP and dehydration due to BA stenosis recommend check BP at home with close monitoring  Long term BP goal 120-140   Hyperlipidemia  Home meds:  none   LDL 137, goal < 70  Now on lipitor 40  Continue statin at discharge  Other Stroke Risk Factors  Advanced age  Obesity, Body mass index is 30.57 kg/m.   Obstructive sleep apnea  Other Active Problems  Depression - continue zoloft  Hypothyroidism - continue synthroid  Hospital day # 0  Neurology will sign off. Please call with questions. Pt will follow up with stroke clinic NP at [facility] in about 4 weeks. Thanks for the consult.   Rosalin Hawking, MD PhD Stroke Neurology 12/14/2017 12:38 PM    To contact Stroke Continuity provider, please refer to [email or web address] After hours, contact General Neurology

## 2017-12-14 NOTE — Care Management Note (Signed)
Case Management Note  Patient Details  Name: Rebecca Steele MRN: [id number] Date of Birth: May 17, 1951  Subjective/Objective:                    Action/Plan:   Expected Discharge Date:                  Expected Discharge Plan:  [place]  In-House Referral:  NA  Discharge planning Services  CM Consult  Post Acute Care Choice:  Home Health Choice offered to:  Patient, Spouse  DME Arranged:  N/A DME Agency:  NA  HH Arranged:  PT, OT HH Agency:  [place]  Status of Service:  Completed, signed off  If discussed at [place] of Stay Meetings, dates discussed:    Additional Comments:  Marilu Favre, RN 12/14/2017, 3:39 PM

## 2017-12-14 NOTE — Evaluation (Addendum)
Physical Therapy Evaluation Patient Details Name: Rebecca Steele MRN: [id number] DOB: 04-12-1951 Today's Date: 12/14/2017   History of Present Illness  This 67 year old female was admitted to the ED from the surgical center s/p AMS/not fully waking up after R lumpectomy.  PMH:  mitral valve prolapse and depression.     Clinical Impression  Pt admitted with above diagnosis. Pt currently with functional limitations due to the deficits listed below (see PT Problem List). PTA pt independent with functional mobility. On eval, she required min assist bed mobility, min assist transfers, and min assist ambulation HHA of 1 40 feet.  Pt without c/o dizziness. She just reports feeling weak. She is speaking in a whisper tone. Pt encouraged to mobilize with nursing. Educated on the importance of mobility. Pt will benefit from skilled PT to increase their independence and safety with mobility to allow discharge to the venue listed below.      Follow Up Recommendations Home health PT(depending on progress)    Equipment Recommendations  None recommended by PT    Recommendations for Other Services       Precautions / Restrictions Precautions Precautions: Fall;Other (comment) Precaution Comments: R breast surgery      Mobility  Bed Mobility Overal bed mobility: Needs Assistance Bed Mobility: Supine to Sit     Supine to sit: Min assist;HOB elevated     General bed mobility comments: cues for sequencing  Transfers   Equipment used: 1 person hand held assist Transfers: Sit to/from [facility] Sit to Stand: Min assist Stand pivot transfers: Min assist       General transfer comment: increased time to stabilize initial standing balance  Ambulation/Gait Ambulation/Gait assistance: Min assist Ambulation Distance (Feet): 40 Feet Assistive device: 1 person hand held assist Gait Pattern/deviations: Step-through pattern;Decreased stride length;Narrow base of support Gait  velocity: decreased Gait velocity interpretation: Below normal speed for age/gender General Gait Details: slow, guarded gait  Stairs            Wheelchair Mobility    Modified Rankin (Stroke Patients Only)       Balance Overall balance assessment: Mild deficits observed, not formally tested                                           Pertinent Vitals/Pain Pain Assessment: Faces Faces Pain Scale: Hurts even more Pain Location: R breast Pain Descriptors / Indicators: Sore Pain Intervention(s): Monitored during session;Repositioned;Ice applied    Home Living Family/patient expects to be discharged to:: Private residence Living Arrangements: Spouse/significant other Available Help at Discharge: Family Type of Home: House Home Access: Stairs to enter   Technical brewer of Steps: 2 Home Layout: One level Home Equipment: Shower seat      Prior Function Level of Independence: Independent               Hand Dominance   Dominant Hand: Right    Extremity/Trunk Assessment   Upper Extremity Assessment Upper Extremity Assessment: Defer to OT evaluation    Lower Extremity Assessment Lower Extremity Assessment: Overall WFL for tasks assessed    Cervical / Trunk Assessment Cervical / Trunk Assessment: Normal  Communication   Communication: No difficulties  Cognition Arousal/Alertness: Awake/alert Behavior During Therapy: WFL for tasks assessed/performed Overall Cognitive Status: Within Functional Limits for tasks assessed  General Comments General comments (skin integrity, edema, etc.): Tremors RLE when sitting EOB. Stopped with initiation of ambulation. Intermittent return during session. Pt reports this happens with anesthesia. VSS throughout session. Pt without c/o dizziness/lightheadedness.     Exercises     Assessment/Plan    PT Assessment Patient needs continued PT  services  PT Problem List Decreased mobility;Decreased activity tolerance;Pain;Decreased balance       PT Treatment Interventions Therapeutic activities;Gait training;Therapeutic exercise;Patient/family education;Stair training;Balance training;Functional mobility training    PT Goals (Current goals can be found in the Care Plan section)  Acute Rehab PT Goals Patient Stated Goal: return to independence PT Goal Formulation: With patient/family Time For Goal Achievement: 12/28/17 Potential to Achieve Goals: Good    Frequency Min 3X/week   Barriers to discharge        Co-evaluation               AM-PAC PT "6 Clicks" Daily Activity  Outcome Measure Difficulty turning over in bed (including adjusting bedclothes, sheets and blankets)?: A Little Difficulty moving from lying on back to sitting on the side of the bed? : Unable Difficulty sitting down on and standing up from a chair with arms (e.g., wheelchair, bedside commode, etc,.)?: A Lot Help needed moving to and from a bed to chair (including a wheelchair)?: A Little Help needed walking in hospital room?: A Little Help needed climbing 3-5 steps with a railing? : A Little 6 Click Score: 15    End of Session Equipment Utilized During Treatment: Gait belt Activity Tolerance: Patient tolerated treatment well Patient left: with call bell/phone within reach;in chair;with family/visitor present Nurse Communication: Mobility status PT Visit Diagnosis: Unsteadiness on feet (R26.81)    Time: 4008-6761 PT Time Calculation (min) (ACUTE ONLY): 33 min   Charges:   PT Evaluation $PT Eval Moderate Complexity: 1 Mod PT Treatments $Gait Training: 8-22 mins   PT G Codes:        Lorrin Goodell, PT  Office # [phone] Pager [phone]   Lorriane Shire 12/14/2017, 12:29 PM

## 2017-12-14 NOTE — Progress Notes (Signed)
Occupational Therapy Treatment Patient Details Name: Rebecca Steele MRN: [id number] DOB: [birth year] Today's Date: 12/14/2017    History of present illness This 67 year old female was admitted to the ED from the surgical center s/p AMS/not fully waking up after R lumpectomy.  PMH:  mitral valve prolapse and depression.    OT comments  Pt with good participation with encouragement  Follow Up Recommendations  Supervision/Assistance - 24 hour;Home health OT(depending upon progress)    Equipment Recommendations  None needed  Recommendations for Other Services      Precautions / Restrictions Precautions Precautions: Fall;Other (comment) Precaution Comments: R breast surgery       Mobility Bed Mobility Overal bed mobility: Needs Assistance Bed Mobility: Supine to Sit     Supine to sit: Min assist;HOB elevated     General bed mobility comments: pt in chair  Transfers Overall transfer level: Needs assistance Equipment used: 1 person hand held assist Transfers: Sit to/from [facility] Sit to Stand: Min guard Stand pivot transfers: Min guard       General transfer comment: needed encouragement    Balance Overall balance assessment: Mild deficits observed, not formally tested                                         ADL either performed or assessed with clinical judgement   ADL Overall ADL's : Needs assistance/impaired                                       General ADL Comments: Pt sitting in chair.  Focused on RUE ROM as pt keeping RUE close to trunk.  Pt needed lots of encouragement but did better with time. Encouraged increased use of RUE. Pt is a Investment banker, corporate and uses BUE often               Cognition Arousal/Alertness: Awake/alert Behavior During Therapy: WFL for tasks assessed/performed Overall Cognitive Status: Within Functional Limits for tasks assessed                                          Exercises Shoulder Exercises Shoulder Flexion: AROM;Right;10 reps;Seated Elbow Extension: AROM;Right;Seated;10 reps   Shoulder Instructions       General Comments Tremors RLE when sitting EOB. Stopped with initiation of ambulation. Intermittent return during session. Pt reports this happens with anesthesia. VSS throughout session. Pt without c/o dizziness/lightheadedness.     Pertinent Vitals/ Pain       Pain Assessment: Faces Pain Score: 2  Faces Pain Scale: Hurts even more Pain Location: R breast Pain Descriptors / Indicators: Sore Pain Intervention(s): Monitored during session  Home Living Family/patient expects to be discharged to:: Private residence Living Arrangements: Spouse/significant other Available Help at Discharge: Family Type of Home: House Home Access: Stairs to enter Technical brewer of Steps: 2   Home Layout: One level     Bathroom Shower/Tub: Tub/shower unit;Walk-in shower   Bathroom Toilet: Handicapped height     Home Equipment: Shower seat          Prior Functioning/Environment Level of Independence: Independent            Frequency  Min 2X/week  Progress Toward Goals  OT Goals(current goals can now be found in the care plan section)  Progress towards OT goals: Progressing toward goals  Acute Rehab OT Goals Patient Stated Goal: return to independence  Plan Discharge plan remains appropriate    Co-evaluation                 AM-PAC PT "6 Clicks" Daily Activity     Outcome Measure   Help from another person eating meals?: A Little Help from another person taking care of personal grooming?: A Little Help from another person toileting, which includes using toliet, bedpan, or urinal?: A Little Help from another person bathing (including washing, rinsing, drying)?: A Lot Help from another person to put on and taking off regular upper body clothing?: A Little Help from another person to put on and  taking off regular lower body clothing?: A Lot 6 Click Score: 16    End of Session    OT Visit Diagnosis: Muscle weakness (generalized) (M62.81)   Activity Tolerance Patient limited by fatigue;Patient tolerated treatment well   Patient Left in chair;with call bell/phone within reach;with family/visitor present   Nurse Communication Mobility status        Time: 3729-0211 OT Time Calculation (min): 19 min  Charges: OT Evaluation $OT Eval Low Complexity: 1 Low OT Treatments $Therapeutic Activity: 8-22 mins  [place], [place] [phone]   Betsy Pries 12/14/2017, 1:04 PM

## 2017-12-14 NOTE — Progress Notes (Signed)
2 Days Post-Op   Subjective/Chief Complaint: Feels much better, some shakiness in legs, concerned about getting up, voiding, eating some now   Objective: Vital signs in last 24 hours: Temp:  [97.9 F (36.6 C)-99 F (37.2 C)] 97.9 F (36.6 C) (03/14 0800) Pulse Rate:  [69-86] 69 (03/14 0800) Resp:  [12-18] 12 (03/14 0800) BP: (120-151)/(66-88) 141/87 (03/14 0800) SpO2:  [96 %-97 %] 97 % (03/14 0800) Last BM Date: 12/11/17  Intake/Output from previous day: 03/13 0701 - 03/14 0700 In: 1383.3 [P.O.:360; I.V.:1023.3] Out: 1100 [Urine:1100] Intake/Output this shift: No intake/output data recorded.  incision clean otherwise exam nonfocal, she is a/o times three this am  Lab Results:  Recent Labs    12/12/17 0705 12/12/17 1650  WBC  --  10.6*  HGB 15.6* 15.9*  HCT 46.0 45.4  PLT  --  268   BMET Recent Labs    12/12/17 0705 12/12/17 1650  NA 140 139  K 3.3* 3.5  CL 100* 106  CO2  --  21*  GLUCOSE 96 136*  BUN 14 9  CREATININE 1.00 1.01*  CALCIUM  --  9.2   PT/INR No results for input(s): LABPROT, INR in the last 72 hours. ABG Recent Labs    12/12/17 1722  PHART 7.460*  HCO3 23.9    Studies/Results: Mr [facility] Contrast  Result Date: 12/12/2017 CLINICAL DATA:  Follow-up code stroke. Became unresponsive after lumpectomy today. Symptoms improving. EXAM: MRI HEAD WITHOUT CONTRAST MRA HEAD WITHOUT CONTRAST TECHNIQUE: Axial and coronal diffusion weighted imaging, axial T2 FLAIR obtained without contrast. Angiographic images of the head were obtained using MRA technique without contrast. COMPARISON:  CT HEAD December 12, 2017 at 1459 hours FINDINGS: MRI HEAD FINDINGS INTRACRANIAL CONTENTS: No reduced diffusion to suggest acute ischemia. The ventricles and sulci are normal for patient's age. No suspicious parenchymal signal, masses, mass effect. No abnormal extra-axial fluid collections. No extra-axial masses. VASCULAR: See below. SKULL AND UPPER CERVICAL SPINE:  Nondiagnostic assessment. SINUSES/ORBITS: Included paranasal sinuses and mastoid air cells are well aerated. Ocular globes and orbital contents are non suspicious, gaze to the RIGHT. OTHER: None. MRA HEAD FINDINGS ANTERIOR CIRCULATION: Normal flow related enhancement of the included cervical, petrous, cavernous and supraclinoid internal carotid arteries. Patent anterior communicating artery. Patent anterior and middle cerebral arteries, including distal segments. No large vessel occlusion, flow limiting stenosis, aneurysm. POSTERIOR CIRCULATION: Codominant vertebral arteries. Moderate narrowed mid basilar artery. Basilar artery is patent, with normal flow related enhancement of the main branch vessels. Patent posterior cerebral arteries. No large vessel occlusion, flow limiting stenosis,  aneurysm. ANATOMIC VARIANTS: None. Source images and MIP images were reviewed. IMPRESSION: MRI HEAD: 1. Normal limited noncontrast MRI of the head, stroke protocol. MRA HEAD: 1. No emergent large vessel occlusion or flow limiting stenosis. 2. Moderate stenosis mid basilar artery. Critical Value/emergent results text paged to Dr.ERIC [facility] via AMION secure system on 12/12/2017 at 3:48 pm, including interpreting physician's phone number. Electronically Signed   By: Elon Alas M.D.   On: 12/12/2017 15:50   Mr Brain Wo Contrast  Result Date: 12/12/2017 CLINICAL DATA:  Follow-up code stroke. Became unresponsive after lumpectomy today. Symptoms improving. EXAM: MRI HEAD WITHOUT CONTRAST MRA HEAD WITHOUT CONTRAST TECHNIQUE: Axial and coronal diffusion weighted imaging, axial T2 FLAIR obtained without contrast. Angiographic images of the head were obtained using MRA technique without contrast. COMPARISON:  CT HEAD December 12, 2017 at 1459 hours FINDINGS: MRI HEAD FINDINGS INTRACRANIAL CONTENTS: No reduced diffusion to suggest acute  ischemia. The ventricles and sulci are normal for patient's age. No suspicious parenchymal signal,  masses, mass effect. No abnormal extra-axial fluid collections. No extra-axial masses. VASCULAR: See below. SKULL AND UPPER CERVICAL SPINE: Nondiagnostic assessment. SINUSES/ORBITS: Included paranasal sinuses and mastoid air cells are well aerated. Ocular globes and orbital contents are non suspicious, gaze to the RIGHT. OTHER: None. MRA HEAD FINDINGS ANTERIOR CIRCULATION: Normal flow related enhancement of the included cervical, petrous, cavernous and supraclinoid internal carotid arteries. Patent anterior communicating artery. Patent anterior and middle cerebral arteries, including distal segments. No large vessel occlusion, flow limiting stenosis, aneurysm. POSTERIOR CIRCULATION: Codominant vertebral arteries. Moderate narrowed mid basilar artery. Basilar artery is patent, with normal flow related enhancement of the main branch vessels. Patent posterior cerebral arteries. No large vessel occlusion, flow limiting stenosis,  aneurysm. ANATOMIC VARIANTS: None. Source images and MIP images were reviewed. IMPRESSION: MRI HEAD: 1. Normal limited noncontrast MRI of the head, stroke protocol. MRA HEAD: 1. No emergent large vessel occlusion or flow limiting stenosis. 2. Moderate stenosis mid basilar artery. Critical Value/emergent results text paged to Dr.ERIC [facility] via AMION secure system on 12/12/2017 at 3:48 pm, including interpreting physician's phone number. Electronically Signed   By: Elon Alas M.D.   On: 12/12/2017 15:50   Dg Chest Portable 1 View  Result Date: 12/12/2017 CLINICAL DATA:  Initial evaluation for acute altered mental status. EXAM: PORTABLE CHEST 1 VIEW COMPARISON:  Prior radiograph from 08/15/2017. FINDINGS: Cardiomegaly.  Mediastinal silhouette normal. Lungs hypoinflated. Probable small bilateral pleural effusions. Diffuse pulmonary and interstitial prominence, compatible pulmonary interstitial edema. Superimposed more confluent opacity at the left lung base may reflect atelectasis or  infiltrate. No pneumothorax. No acute osseous abnormality. IMPRESSION: 1. Cardiomegaly with mild diffuse pulmonary interstitial edema and probable small bilateral pleural effusions. 2. Superimposed more confluent left basilar opacity, which may reflect atelectasis and/or infiltrate. Electronically Signed   By: Jeannine Boga M.D.   On: 12/12/2017 17:33   Ct Head Code Stroke Wo Contrast`  Result Date: 12/12/2017 CLINICAL DATA:  Code stroke. Unresponsive following breast surgery, lumpectomy, had surgical center. EXAM: CT HEAD WITHOUT CONTRAST TECHNIQUE: Contiguous axial images were obtained from the base of the skull through the vertex without intravenous contrast. COMPARISON:  None. FINDINGS: Brain: No acute infarct, hemorrhage, or mass lesion is present. The ventricles are of normal size. No significant extraaxial fluid collection is present. No significant white matter disease is present. The brainstem and cerebellum are normal. Vascular: Atherosclerotic calcifications are present in the cavernous internal carotid arteries bilaterally. There is no hyperdense vessel. Skull: Calvarium is intact. No focal lytic or blastic lesions are present. Sinuses/Orbits: Paranasal sinuses and mastoid air cells are clear. The globes and orbits are within normal limits. [facility] Stroke Program Early CT Score) - Ganglionic level infarction (caudate, lentiform nuclei, internal capsule, insula, M1-M3 cortex): 7/7 - Supraganglionic infarction (M4-M6 cortex): 3/3 Total score (0-10 with 10 being normal): 10/10 IMPRESSION: 1. Negative CT of the head 2. ASPECTS is 10/10 Electronically Signed   By: San Morelle M.D.   On: 12/12/2017 15:21    Anti-infectives: Anti-infectives (From admission, onward)   Start     Dose/Rate Route Frequency Ordered Stop   12/12/17 0651  ceFAZolin (ANCEF) IVPB 2g/100 mL premix     2 g 200 mL/hr over 30 Minutes Intravenous On call to O.R. 12/12/17 9528 12/12/17 0756       Assessment/Plan: POD 2 right breast biopsy  -she is much improved, all testing negative, not sure what  happened -await any further neuro recs -await pt evaluation before determining dc status -discussed path today, is csl with some adh.  Discussed adh as high risk lesion.she does not need more surgery  Rolm Bookbinder 12/14/2017

## 2017-12-14 NOTE — Progress Notes (Addendum)
Anesthesia Follow up I came to visit Rebecca Steele and discuss my thoughts about her perioperative and hospital course. I have been following her throughout her stay in Epic, have read all notes, and spoken with Dr. Donne Hazel earlier today. I shared, in detail, my thoughts about the potential cause of her apparent oversedation in the postoperative period. I will also address many of the possibilities suggested by other providers as an explanation of her presentation.  1.) Anesthesia almost certainly did NOT cause what has occured. She received an anesthetic that is standard for this surgery and was it unremarkable throughout. Of note, she did NOT receive midazolam because of her reported history of prolonged emergence. She received 100 mcg of Fentanyl for the entirety of her care at [facility]), and that was given with induction of anesthesia at 0733. She also received 100 mg of lidocaine and 160 mg of propofol via single bolus with induction, 10 mg of dexamethasone and 4 mg of ondansetron for antiemetics, and sevoflurane for maintenance of anesthesia. Emergence began at [phone] with the beginning inspired sevo of 1.3% at which time the sevo was discontinued. LMA was removed at 0843 with an expired concentration of sevo was 0.1% at that time. Emergence and extubation proceeded in a routine fashion with the patient spontaneously ventilating and moving all extremities. About 1 hour after her arrival in PACU is when I was notified of her unresponsiveness and apparent oversedation. This, of course did NOT make sense because she had not received any additional medications, and beacuse of her rapid emergence from general anesthesia and the fact that the concentration of all neuro active medications would have continued to decline. Her respiratory rate was normal and, after about 1000, was on room air with SpO2 94-98%. Although she was not on capnography, significant hypercapnia is not possible due to the alveolar  gas equation. Also of note, she received  lidocaine 60 mg, propofol infusion (total dose ~60mg ), ondansetron and 50 mcg of fentanyl with a trigger finger release 05/18/2015 at [facility] and there is no evidence of delayed emergence or prolonged PACU stay for that procedure.   2.) At no time during her stay at [facility] was she hypotensive. Her lowest BP in the OR was 94/69 with a MAP of 78. This is within 20% of her baseline BP that is well established as being normal in the OR and expected during sleep, either natural or induced. Of note, there are BP measurements during sleep hours at [facility] on 12/13/2017 recorded as 100/59. 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 Hypoperfusion or stroke of this region of the brain would NOT lead to symptoms this patient exhibited. Furthermore, the definition of TIA symptoms LESS than 24 hours, and her symptoms were clearly beyond that timeframe. Without evidence of stroke, and her symptoms not  fiting the definition of TIA, I do note believe her symptoms can be in any way explained by cerebral blood flow.  3.) She did not laryngospasm or even hypoventilate during her time in [place]. She did not require supplemental O2 after a brief initially throughout her PACU stay and showed no signs of pulmonary edema at [facility]. Pulmonary edema associated with laryngospasm is negative pressure related and perihilar in presentation and X-Ray findings are a late finding. CXR findings here were mild and diffuse. Of note, there are literature accounts of IV naloxone causing non-cardiogenic pulmonary edema. It is possible that, in the  setting of presumed miniscule blood concentration of fentanyl at the time, the large dose of naloxone given in the ED could have caused this. Additionally, she did receive IV fluids throughout the day and thus her pulmonary edema might have been a result fluid overload if a bolus was given in the ED. She received 600 ml  of LR in the OR and and minimal IVF post op. She had PO fluids and voided it DSC, though both were unmeasured.   4.) She exhibited no signs of seizure activity at [facility]. Again, after gain, ambulated and was AAO x 4 in PACU.  5.) The only medication she received, in the doses in which she received them, that could potentially cause any significant postoperative sedation was preoperative 300 mg of gabapentin. Though it may have contributed to her postop sedation, I do not in any way believe gabapentin explains what occurred.     Following is the narrative I wrote in my PACU note on 12/12/2016: Called to bedside in PACU for patient being unresponsive. Patients vitals all looked perfect. On no supplemental O2 satting 98%. Pt did not respond to moderately uncomfortable stimulus to left clavicle. Patient did not correct her falling arm from hitting her face. She had brisk lid reflex and her pupils ERR bilaterally. She did appear to look right laterally when I opened her eyelid. We watched her closely, but she had minimal improvement in level of consciousness. I discussed transferring her to [facility] for neurology eval and possible head CT for stroke workup. I gave 0.08 mg of narcan to rule out narcotic over sedation. She quickly began to open her eyes and follow commands, though she seemed sedated still. She was moved to phase 2 where she walked, with assistance, to the bathroom. At some point she became somnolent again. Dr. Lyndle Herrlich was covering her recovery at this point. I discussed her course with Dr. Donne Hazel, who was operating at [facility]. We discussed repeat narcan and potential transfer to [facility], which eventually occurred.    On exam today, she was sitting comfortably in the recliner with husband at bedside. Shortly after I introduced myself, she began to faintly tremble/shake in upper and lower extremities. She was indeed flat affected and at times during my discussion with her she seemed to be fighting back  tears. I explained that Anesthesia almost certainly did NOT the cause what has occurred, she was not hypotensive, did not laryngospasm, or exhibit signs of seizure activity at [facility], and gabapentin was the only medication that could have possibly caused significant sedation. I did, however, emphasize that, were she ever to need gabapentin again, I would not use this event to deny its use.    My recommendation is that she receive a psychiatric evaluation. I do not believe there is an organic explanation for what has occurred. 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

## 2017-12-14 NOTE — Addendum Note (Signed)
Addendum  created 12/14/17 1911 by Nolon Nations, MD   Sign clinical note

## 2017-12-14 NOTE — Addendum Note (Signed)
Addendum  created 12/14/17 1812 by Nolon Nations, MD   Intraprocedure Flowsheets edited, Pend clinical note, Sign clinical note

## 2017-12-15 MED ORDER — ASPIRIN 81 MG PO TBEC: 81.0000 mg | DELAYED_RELEASE_TABLET | Freq: Every day | ORAL | 2 refills | Status: DC

## 2017-12-15 NOTE — Progress Notes (Signed)
3 Days Post-Op   Subjective/Chief Complaint: Doing much better, ready to go home  Objective: Vital signs in last 24 hours: Temp:  [97.9 F (36.6 C)-99.2 F (37.3 C)] 98.5 F (36.9 C) (03/15 0605) Pulse Rate:  [69-86] 71 (03/15 0605) Resp:  [12-16] 16 (03/15 0605) BP: (123-142)/(60-87) 134/77 (03/15 0605) SpO2:  [92 %-98 %] 92 % (03/15 0605) Last BM Date: 12/12/17  Intake/Output from previous day: 03/14 0701 - 03/15 0700 In: 480 [P.O.:480] Out: 1700 [Urine:1700] Intake/Output this shift: No intake/output data recorded.    Lab Results:  Recent Labs    12/12/17 1650  WBC 10.6*  HGB 15.9*  HCT 45.4  PLT 268   BMET Recent Labs    12/12/17 1650  NA 139  K 3.5  CL 106  CO2 21*  GLUCOSE 136*  BUN 9  CREATININE 1.01*  CALCIUM 9.2   PT/INR No results for input(s): LABPROT, INR in the last 72 hours. ABG Recent Labs    12/12/17 1722  PHART 7.460*  HCO3 23.9    Studies/Results: No results found.  Anti-infectives: Anti-infectives (From admission, onward)   Start     Dose/Rate Route Frequency Ordered Stop   12/12/17 0651  ceFAZolin (ANCEF) IVPB 2g/100 mL premix     2 g 200 mL/hr over 30 Minutes Intravenous On call to O.R. 12/12/17 0651 12/12/17 0756      Assessment/Plan: Pod 3 breast biopsy  Dc home today with therapies as recommended  Rolm Bookbinder 12/15/2017

## 2017-12-18 NOTE — Addendum Note (Signed)
Addendum  created 12/18/17 0034 by Nolon Nations, MD   Sign clinical note

## 2017-12-20 NOTE — Discharge Summary (Signed)
Physician Discharge Summary  Patient ID: Rebecca Steele MRN: [id number] DOB/AGE: 11-22-1950 67 y.o.  Admit date: 12/12/2017 Discharge date: 12/20/2017  Admission Diagnoses: Obtundation S/p breast biopsy  Discharge Diagnoses:  Active Problems:   Obtundation   Discharged Condition: fair  Hospital Course: 67 yof who underwent uncomplicated right breast biopsy with pathology showing csl and adh.  She really had fairly minimal anesthesia.  She was very slow to wake up.  Due to concern with myself and anesthesia we transferred her to [place] for code stroke. She was seen by neurology. 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  She then also underwent an echo and a carotid US that were both negative. She was started on aspirin per neuro recs.  She eventually began becoming more alert and responsive. She was seen by therapies and cleared for home. She was discharged home. Im not sure of the etiology of this.   Consults: neurology  Significant Diagnostic Studies: mri/mra, ct scan, carotid US, tte  Treatments: IV hydration    Disposition: home with home health   Allergies as of 12/15/2017      Reactions   Valium Anaphylaxis   Doxycycline Swelling   Facial swelling   Iohexol     Desc: Pt. stated she had iv contrast around 25 yrs ago and had itching on her neck.  she took benadryl and it went away.  The rad recommended premeds and be done tomorrow but the pt.did not want to do that so we did her w/o iv contrast today., Onset Date: [id number]   Macrodantin Nausea And Vomiting   Red Dye Swelling   Facial swelling (cannot take pink or red tablets or capsules)   Rocephin [ceftriaxone] Other (See Comments)   Joint aches   Zithromax [azithromycin] Other (See Comments)   Weakness, cold/clammy, legs trembling      Medication List    TAKE these medications   acetaminophen 500 MG tablet Commonly known as:  TYLENOL Take 500-1,000  mg by mouth every 6 (six) hours as needed for headache (pain).   aspirin 81 MG EC tablet Take 1 tablet (81 mg total) by mouth daily.   ESGIC 50-325-40 MG tablet Generic drug:  butalbital-acetaminophen-caffeine Take 1 tablet by mouth 2 (two) times daily as needed for headache or migraine.   ibuprofen 200 MG tablet Commonly known as:  ADVIL,MOTRIN Take 200 mg by mouth every 6 (six) hours as needed for headache (pain).   levothyroxine 25 MCG tablet Commonly known as:  SYNTHROID, LEVOTHROID Take 25 mcg by mouth daily before breakfast.   sertraline 100 MG tablet Commonly known as:  ZOLOFT Take 100 mg by mouth daily.   traZODone 100 MG tablet Commonly known as:  DESYREL Take 100 mg by mouth at bedtime.   triamterene-hydrochlorothiazide 37.5-25 MG tablet Commonly known as:  MAXZIDE-25 Take 0.5 tablets by mouth daily.      Follow-up Information    Rolm Bookbinder, MD Follow up in 1 week(s).   Specialty:  General Surgery Contact information: [place] [address] [phone]        Venancio Poisson, NP. Schedule an appointment as soon as possible for a visit in 4 week(s).   Specialty:  Nurse Practitioner Contact information: [address] [place] [address] [place] Follow up.   Why:  Provide home health services  Contact information: [address] [place] [address] [phone]           Signed: Rolm Bookbinder 12/20/2017, 10:05 AM

## 2018-02-27 ENCOUNTER — Telehealth: Payer: Self-pay | Admitting: Oncology

## 2018-02-27 ENCOUNTER — Encounter: Payer: Self-pay | Admitting: Oncology

## 2018-02-27 NOTE — Telephone Encounter (Signed)
Referral received from Dr. Donne Hazel from [place] for the high risk breast clinic. Pt has been scheduled to see Dr. Jana Hakim on 6/17 at 4pm. Pt aware to arrive 30 minutes early. Letter mailed.

## 2018-02-28 DIAGNOSIS — T50905A Adverse effect of unspecified drugs, medicaments and biological substances, initial encounter: Secondary | ICD-10-CM | POA: Diagnosis not present

## 2018-02-28 DIAGNOSIS — T148XXA Other injury of unspecified body region, initial encounter: Secondary | ICD-10-CM | POA: Diagnosis not present

## 2018-02-28 DIAGNOSIS — N63 Unspecified lump in unspecified breast: Secondary | ICD-10-CM | POA: Diagnosis not present

## 2018-03-07 DIAGNOSIS — E782 Mixed hyperlipidemia: Secondary | ICD-10-CM | POA: Diagnosis not present

## 2018-03-07 DIAGNOSIS — E559 Vitamin D deficiency, unspecified: Secondary | ICD-10-CM | POA: Diagnosis not present

## 2018-03-07 DIAGNOSIS — R946 Abnormal results of thyroid function studies: Secondary | ICD-10-CM | POA: Diagnosis not present

## 2018-03-07 DIAGNOSIS — I10 Essential (primary) hypertension: Secondary | ICD-10-CM | POA: Diagnosis not present

## 2018-03-08 DIAGNOSIS — I10 Essential (primary) hypertension: Secondary | ICD-10-CM | POA: Diagnosis not present

## 2018-03-08 DIAGNOSIS — Z6829 Body mass index (BMI) 29.0-29.9, adult: Secondary | ICD-10-CM | POA: Diagnosis not present

## 2018-03-08 DIAGNOSIS — M7541 Impingement syndrome of right shoulder: Secondary | ICD-10-CM | POA: Diagnosis not present

## 2018-03-08 DIAGNOSIS — E782 Mixed hyperlipidemia: Secondary | ICD-10-CM | POA: Diagnosis not present

## 2018-03-08 DIAGNOSIS — E039 Hypothyroidism, unspecified: Secondary | ICD-10-CM | POA: Diagnosis not present

## 2018-03-08 DIAGNOSIS — N6310 Unspecified lump in the right breast, unspecified quadrant: Secondary | ICD-10-CM | POA: Diagnosis not present

## 2018-03-08 DIAGNOSIS — N6320 Unspecified lump in the left breast, unspecified quadrant: Secondary | ICD-10-CM | POA: Diagnosis not present

## 2018-03-08 DIAGNOSIS — M25511 Pain in right shoulder: Secondary | ICD-10-CM | POA: Diagnosis not present

## 2018-03-18 NOTE — Progress Notes (Signed)
Rebecca Steele  [phone] [phone]     ID: Rebecca Steele DOB: June 30, 1951  MR#: [id number]  XEN#:[id number]  Patient Care Team: Rebecca Bien, MD as PCP - General (Family Medicine) Rebecca Bookbinder, MD as Consulting Physician (General Surgery) Rebecca Steele, Rebecca Dad, MD as Consulting Physician (Oncology) Rebecca Farrier, MD as Consulting Physician (Obstetrics and Gynecology) OTHER MD:  CHIEF COMPLAINT: Atypical ductal hyperplasia  CURRENT TREATMENT: Intensified screening   HISTORY OF CURRENT ILLNESS: Rebecca Steele had routine screening mammography on 11/17/2017 showing a possible abnormality in the right breast. She underwent unilateral right diagnostic mammography with tomography and right breast ultrasonography at [facility] on 11/24/2017 showing: breast density category C. There was a small subtle architectural distortion in the medial aspect of the right breast. Ultrasonography showed several small cysts throughout the medial aspect of the right breast, but no solid masses and no convincing sonographic architectural distortion. The right axilla showed no enlarged or abnormal lymph nodes  Accordingly on 11/28/2017 she proceeded to biopsy of the right breast area in question. The pathology from this procedure showed ([id number]): a complex sclerosing lesions and vascular calcifications.   She underwent right lumpectomy ([id number]) on 12/12/2017 of the breast area with pathology showing: Complex sclerosing lesion with usual ductal hyperplasia and calcifications. Vascular calcifications. No malignancy was identified. In the posterior margin of the right breast, however, there was atypical ductal hyperplasia and flat epithelial atypia.  Note that Dr. Donne Steele comments "I did remove some additional posterior tissue down to the muscle" which means this margin is complete.  No malignancy was identified.   INTERVAL HISTORY: Rebecca Steele was  evaluated in the high risk breast cancer clinic on 03/19/2018 accompanied by her husband, Rebecca Steele. she did well with her surgery, without any unusual pain, bleeding, fever or other complications.  REVIEW OF SYSTEMS: There were no specific symptoms leading to the original mammogram, which was routinely scheduled. The patient denies unusual headaches, visual changes, nausea, vomiting, stiff neck, dizziness, or gait imbalance. There has been no cough, phlegm production, or pleurisy, no chest pain or pressure, and no change in bowel or bladder habits. The patient denies fever, rash, bleeding, unexplained fatigue or unexplained weight loss. A detailed review of systems was otherwise entirely negative.   PAST MEDICAL HISTORY: Past Medical History:  Diagnosis Date  . Breast mass, right   . Complication of anesthesia    states she has had 2 neck surgeries and her neck is stiff, hard to wake  . Depression   . Hematuria - cause not known   . Hypertension   . Hypothyroidism   . Kidney stones   . Mitral valve prolapse   . Renal disease   . Sleep apnea    HAS MILD OSA, NO CPAP NEEDED    PAST SURGICAL HISTORY: Past Surgical History:  Procedure Laterality Date  . APPENDECTOMY    . CHOLECYSTECTOMY    . CYSTOSCOPY W/ RETROGRADES    . NECK SURGERY    . RADIOACTIVE SEED GUIDED EXCISIONAL BREAST BIOPSY Right 12/12/2017   Procedure: RIGHT RADIOACTIVE SEED GUIDED EXCISIONAL BREAST BIOPSY ERAS PATHWAY;  Surgeon: Rebecca Bookbinder, MD;  Location: [facility];  Service: General;  Laterality: Right;  LMA  . SHOULDER SURGERY    . TONSILLECTOMY    . TRIGGER FINGER RELEASE Right 05/18/2015   Procedure: RIGHT  LONG FINGER TRIGGER RELEASE ;  Surgeon: Rebecca Jakob, MD;  Location: [facility];  Service: Orthopedics;  Laterality: Right;  FAMILY HISTORY No family history on file.  The patient's father died of heart issues at age 42. The patient's mother died at 13 from internal  hemorrhage. The patient has 1 brother and no sisters. There was a maternal aunt with breast cancer diagnosed at age 41.  The patient denies a family hisotry of ovarian cancer.   GYNECOLOGIC HISTORY:  No LMP recorded. Patient is postmenopausal. Menarche: 67 years old Age at first live birth: 67 years old She is GXP2.  Her LMP was in her mid 2's. She did not use contraception or HRT.    SOCIAL HISTORY:  Rebecca Steele  Is now retired from being a Freight forwarder for [employer]. She has been married to her husband, Rebecca Steele",  for 46 years (as of June 2019). He is a former Licensed conveyancer. Their oldest daughter is Rebecca Steele, who works for [employer] in [employer]. The patient's son, Rebecca Steele is a Chartered certified accountant for [employer]. The patient has 4 granchildren. She attends [facility] in [place].     ADVANCED DIRECTIVES:    HEALTH MAINTENANCE: Social History   Tobacco Use  . Smoking status: Never Smoker  . Smokeless tobacco: Never Used  Substance Use Topics  . Alcohol use: No  . Drug use: No     Colonoscopy: Dr. Amedeo Steele UTD  PAP: 2017  Bone density: About 2+ years /normal    Allergies  Allergen Reactions  . Other     Pt states she was given Gabapentin and Fentanyl for anesthesia during recent surgery and was unable to wake up for several hours.  Pt states she does not want to have sedation with those medications in the future.  . Valium Anaphylaxis  . Doxycycline Swelling    Facial swelling  . Iohexol      Desc: Pt. stated she had iv contrast around 25 yrs ago and had itching on her neck.  she took benadryl and it went away.  The rad recommended premeds and be done tomorrow but the pt.did not want to do that so we did her w/o iv contrast today., Onset Date: [id number]   . Macrodantin Nausea And Vomiting  . Red Dye Swelling    Facial swelling (cannot take pink or red tablets or capsules)  . Rocephin [Ceftriaxone] Other (See Comments)    Joint  aches  . Zithromax [Azithromycin] Other (See Comments)    Weakness, cold/clammy, legs trembling    Current Outpatient Medications  Medication Sig Dispense Refill  . acetaminophen (TYLENOL) 500 MG tablet Take 500-1,000 mg by mouth every 6 (six) hours as needed for headache (pain).    Marland Kitchen aspirin EC 81 MG EC tablet Take 1 tablet (81 mg total) by mouth daily. 30 tablet 2  . butalbital-acetaminophen-caffeine (ESGIC) 50-325-40 MG tablet Take 1 tablet by mouth 2 (two) times daily as needed for headache or migraine.    . Cholecalciferol (VITAMIN D3) 1000 units CAPS Take by mouth daily.    Marland Kitchen ibuprofen (ADVIL,MOTRIN) 200 MG tablet Take 200 mg by mouth every 6 (six) hours as needed for headache (pain).    . Levothyroxine Sodium 50 MCG CAPS Take 50 mcg by mouth daily before breakfast.     . sertraline (ZOLOFT) 100 MG tablet Take 100 mg by mouth daily.    . traZODone (DESYREL) 100 MG tablet Take 100 mg by mouth at bedtime.    . triamterene-hydrochlorothiazide (MAXZIDE-25) 37.5-25 MG tablet Take 0.5 tablets by mouth daily.   3   No  current facility-administered medications for this visit.     OBJECTIVE: Middle-aged white woman in no acute distress  Vitals:   03/19/18 1524  BP: 126/73  Pulse: 77  Resp: 18  Temp: 98.1 F (36.7 C)  SpO2: 99%     Body mass index is 30.54 kg/m.   Wt Readings from Last 3 Encounters:  03/19/18 189 lb 3.2 oz (85.8 kg)  12/13/17 189 lb 6 oz (85.9 kg)  05/18/15 179 lb (81.2 kg)      ECOG FS:0 - Asymptomatic  Ocular: Sclerae unicteric, pupils round and equal Ear-nose-throat: Oropharynx clear and moist Lymphatic: No cervical or supraclavicular adenopathy Lungs no rales or rhonchi Heart regular rate and rhythm Abd soft, nontender, positive bowel sounds MSK no focal spinal tenderness, no joint edema Neuro: non-focal, well-oriented, appropriate affect Breasts: The right breast is status post recent lumpectomy.  The cosmetic result is excellent.  There is no  evidence of residual recurrent disease, no erythema, swelling, or nodularity.  The left breast is benign.  Both axillae are benign.   LAB RESULTS:  CMP     Component Value Date/Time   NA 139 12/12/2017 1650   K 3.5 12/12/2017 1650   CL 106 12/12/2017 1650   CO2 21 (L) 12/12/2017 1650   GLUCOSE 136 (H) 12/12/2017 1650   BUN 9 12/12/2017 1650   CREATININE 1.01 (H) 12/12/2017 1650   CALCIUM 9.2 12/12/2017 1650   PROT 5.9 (L) 12/12/2017 1650   ALBUMIN 3.4 (L) 12/12/2017 1650   AST 32 12/12/2017 1650   ALT 21 12/12/2017 1650   ALKPHOS 70 12/12/2017 1650   BILITOT 0.6 12/12/2017 1650   GFRNONAA 57 (L) 12/12/2017 1650   GFRAA >60 12/12/2017 1650    No results found for: TOTALPROTELP, ALBUMINELP, A1GS, A2GS, BETS, BETA2SER, GAMS, MSPIKE, SPEI  No results found for: KPAFRELGTCHN, LAMBDASER, KAPLAMBRATIO  Lab Results  Component Value Date   WBC 10.6 (H) 12/12/2017   NEUTROABS 3.3 08/23/2011   HGB 15.9 (H) 12/12/2017   HCT 45.4 12/12/2017   MCV 91.0 12/12/2017   PLT 268 12/12/2017    _0 @  No results found for: LABCA2  No components found for: XBDZHG992  No results for input(s): INR in the last 168 hours.  No results found for: LABCA2  No results found for: EQA834  No results found for: HDQ222  No results found for: LNL892  No results found for: CA2729  No components found for: HGQUANT  No results found for: CEA1 / No results found for: CEA1   No results found for: AFPTUMOR  No results found for: CHROMOGRNA  No results found for: PSA1  No visits with results within 3 Day(s) from this visit.  Latest known visit with results is:  Admission on 12/12/2017, Discharged on 12/15/2017  Component Date Value Ref Range Status  . Sodium 12/12/2017 140  135 - 145 mmol/L Final  . Potassium 12/12/2017 3.3* 3.5 - 5.1 mmol/L Final  . Chloride 12/12/2017 100* 101 - 111 mmol/L Final  . BUN 12/12/2017 14  6 - 20 mg/dL Final  . Creatinine, Ser 12/12/2017 1.00   0.44 - 1.00 mg/dL Final  . Glucose, Bld 12/12/2017 96  65 - 99 mg/dL Final  . Calcium, Ion 12/12/2017 1.21  1.15 - 1.40 mmol/L Final  . TCO2 12/12/2017 26  22 - 32 mmol/L Final  . Hemoglobin 12/12/2017 15.6* 12.0 - 15.0 g/dL Final  . HCT 12/12/2017 46.0  36.0 - 46.0 % Final  . Glucose-Capillary 12/12/2017 120*  65 - 99 mg/dL Final  . WBC 12/12/2017 10.6* 4.0 - 10.5 K/uL Final  . RBC 12/12/2017 4.99  3.87 - 5.11 MIL/uL Final  . Hemoglobin 12/12/2017 15.9* 12.0 - 15.0 g/dL Final  . HCT 12/12/2017 45.4  36.0 - 46.0 % Final  . MCV 12/12/2017 91.0  78.0 - 100.0 fL Final  . MCH 12/12/2017 31.9  26.0 - 34.0 pg Final  . MCHC 12/12/2017 35.0  30.0 - 36.0 g/dL Final  . RDW 12/12/2017 13.4  11.5 - 15.5 % Final  . Platelets 12/12/2017 268  150 - 400 K/uL Final   Performed at [facility], [place] [address] [place], [place] [address]  . Sodium 12/12/2017 139  135 - 145 mmol/L Final  . Potassium 12/12/2017 3.5  3.5 - 5.1 mmol/L Final  . Chloride 12/12/2017 106  101 - 111 mmol/L Final  . CO2 12/12/2017 21* 22 - 32 mmol/L Final  . Glucose, Bld 12/12/2017 136* 65 - 99 mg/dL Final  . BUN 12/12/2017 9  6 - 20 mg/dL Final  . Creatinine, Ser 12/12/2017 1.01* 0.44 - 1.00 mg/dL Final  . Calcium 12/12/2017 9.2  8.9 - 10.3 mg/dL Final  . Total Protein 12/12/2017 5.9* 6.5 - 8.1 g/dL Final  . Albumin 12/12/2017 3.4* 3.5 - 5.0 g/dL Final  . AST 12/12/2017 32  15 - 41 U/L Final  . ALT 12/12/2017 21  14 - 54 U/L Final  . Alkaline Phosphatase 12/12/2017 70  38 - 126 U/L Final  . Total Bilirubin 12/12/2017 0.6  0.3 - 1.2 mg/dL Final  . GFR calc non Af Amer 12/12/2017 57* >60 mL/min Final  . GFR calc Af Amer 12/12/2017 >60  >60 mL/min Final   Comment: (NOTE) The eGFR has been calculated using the CKD EPI equation. This calculation has not been validated in all clinical situations. eGFR's persistently <60 mL/min signify possible Chronic Kidney Disease.   Georgiann Hahn gap 12/12/2017 12  5 - 15 Final   Performed  at [facility], [place] [address], [place], [place] [address]  . Troponin I 12/12/2017 <0.03  <0.03 ng/mL Final   Performed at [facility] [address] [place], [place] [address]  . Opiates 12/12/2017 NONE DETECTED  NONE DETECTED Final  . Cocaine 12/12/2017 NONE DETECTED  NONE DETECTED Final  . Benzodiazepines 12/12/2017 NONE DETECTED  NONE DETECTED Final  . Amphetamines 12/12/2017 NONE DETECTED  NONE DETECTED Final  . Tetrahydrocannabinol 12/12/2017 NONE DETECTED  NONE DETECTED Final  . Barbiturates 12/12/2017 POSITIVE* NONE DETECTED Final   Comment: (NOTE) DRUG SCREEN FOR MEDICAL PURPOSES ONLY.  IF CONFIRMATION IS NEEDED FOR ANY PURPOSE, NOTIFY LAB WITHIN 5 DAYS. LOWEST DETECTABLE LIMITS FOR URINE DRUG SCREEN Drug Class                     Cutoff (ng/mL) Amphetamine and metabolites    1000 Barbiturate and metabolites    200 Benzodiazepine                 027 Tricyclics and metabolites     300 Opiates and metabolites        300 Cocaine and metabolites        300 THC                            50 Performed at [facility], [place] [address], [place], [place] [address]   .  pH, Arterial 12/12/2017 7.460* 7.350 - 7.450 Final  . pCO2 arterial 12/12/2017 33.6  32.0 - 48.0 mmHg Final  . pO2, Arterial 12/12/2017 93.0  83.0 - 108.0 mmHg Final  . Bicarbonate 12/12/2017 23.9  20.0 - 28.0 mmol/L Final  . TCO2 12/12/2017 25  22 - 32 mmol/L Final  . O2 Saturation 12/12/2017 98.0  % Final  . Acid-Base Excess 12/12/2017 1.0  0.0 - 2.0 mmol/L Final  . Patient temperature 12/12/2017 98.6 F   Final  . Collection site 12/12/2017 RADIAL, ALLEN'S TEST ACCEPTABLE   Final  . Drawn by 12/12/2017 Operator   Final  . Sample type 12/12/2017 ARTERIAL   Final  . Cholesterol 12/13/2017 200  0 - 200 mg/dL Final  . Triglycerides 12/13/2017 92  <150 mg/dL Final  . HDL 12/13/2017 45  >40 mg/dL Final  . Total CHOL/HDL Ratio 12/13/2017 4.4  RATIO Final  . VLDL 12/13/2017 18  0 - 40 mg/dL  Final  . LDL Cholesterol 12/13/2017 137* 0 - 99 mg/dL Final   Comment:        Total Cholesterol/HDL:CHD Risk Coronary Heart Disease Risk Table                     Men   Women  1/2 Average Risk   3.4   3.3  Average Risk       5.0   4.4  2 X Average Risk   9.6   7.1  3 X Average Risk  23.4   11.0        Use the calculated Patient Ratio above and the CHD Risk Table to determine the patient's CHD Risk.        ATP III CLASSIFICATION (LDL):  <100     mg/dL   Optimal  100-129  mg/dL   Near or Above                    Optimal  130-159  mg/dL   Borderline  160-189  mg/dL   High  >190     mg/dL   Very High Performed at [facility] [address] [place], [place] [address]   . Hgb A1c MFr Bld 12/13/2017 5.3  4.8 - 5.6 % Final   Comment: (NOTE) Pre diabetes:          5.7%-6.4% Diabetes:              >6.4% Glycemic control for   <7.0% adults with diabetes   . Mean Plasma Glucose 12/13/2017 105.41  mg/dL Final   Performed at [facility] [address] [place], [place] [address]  . Weight 12/13/2017 3,030  oz Final  . Height 12/13/2017 66  in Final  . BP 12/13/2017 122/72  mmHg Final    (this displays the last labs from the last 3 days)  No results found for: TOTALPROTELP, ALBUMINELP, A1GS, A2GS, BETS, BETA2SER, GAMS, MSPIKE, SPEI (this displays SPEP labs)  No results found for: KPAFRELGTCHN, LAMBDASER, KAPLAMBRATIO (kappa/lambda light chains)  No results found for: HGBA, HGBA2QUANT, HGBFQUANT, HGBSQUAN (Hemoglobinopathy evaluation)   No results found for: LDH  No results found for: IRON, TIBC, IRONPCTSAT (Iron and TIBC)  No results found for: FERRITIN  Urinalysis    Component Value Date/Time   COLORURINE AMBER (A) 08/23/2011 0312   APPEARANCEUR CLOUDY (A) 08/23/2011 0312   LABSPEC 1.023 08/23/2011 0312   PHURINE 7.0 08/23/2011 Rebecca Head 08/23/2011 0312   HGBUR  LARGE (A) 08/23/2011 0312   BILIRUBINUR NEGATIVE 08/23/2011 0312    KETONESUR NEGATIVE 08/23/2011 0312   PROTEINUR NEGATIVE 08/23/2011 0312   UROBILINOGEN 0.2 08/23/2011 0312   NITRITE NEGATIVE 08/23/2011 0312   LEUKOCYTESUR SMALL (A) 08/23/2011 0312     STUDIES: Pathology report reviewed with the patient  ELIGIBLE FOR AVAILABLE RESEARCH PROTOCOL: No  ASSESSMENT: 67 y.o. [place], [place] woman status post right lumpectomy 12/12/2017 showing atypical ductal hyperplasia  PLAN: We spent the better part of today's 50 minute appointment discussing the biology of her diagnosis and the specifics of her situation. (1) significant family history of breast cancer: Genetics testing pending  (2) greater than 20% lifetime risk of breast cancer (even if genetics prove negative)  (a) risk reduction:    Anastrozole started 01/17/2018   Lifestyle modification (diet and exercise program)  (b) intensified screening   Yearly mammography with tomography (February).   Yearly breast MRI (August)   Biannual MD breast exam  PLAN: We spent the better part of today's 50-minute appointment discussing the biology of breast cancer in general, and the specifics of atypical ductal hyperplasia [ADH] more specifically. Rebecca Steele understands ADH means, first, relatively unrestricted growth of breast cells and, in addition, some morphologically different features from simple hyperplasia. Atypical ductal hyperplasia can be difficult to tell apart ductal cancer in situ. In her case it was found as part of workup for a complex sclerosing lesion.  We discussed genetics testing and Rebecca Steele does not meet criteria. I have asked her to check with other family members regarding the possible history of additional relatives with breast or ovarian cancer.  We then discussed ADH as a marker of breast cancer risk. The risk of developing breast cancer in patients with ADH approaches1% per year. The new cancer can develop in either breast. One half of those tumors would be invasive.  In Milana's case there  is also a family history of breast cancer. Her breast density also contributes, with other factors, to the prediction of a 28.6% risk of developing breast cancer in her lifetime (using the [place])  There are 2 ways of dealing with this problem.  One is risk reduction.  The other one is intensified screening.  Rebecca Steele has essentially three ways of lowering her breast cancer risk. One of them is bilateral mastectomies. This is not recommended for this indication and I do not believe it would be covered by insurance even if she wanted it which she doesn't. The other, more reasonable approach would be to take anti-estrogens. This could be tamoxifen, raloxifene/Evista, or an aromatase inhibitor.  She was instructed on the possible toxicities, side effects and complications of the tamoxifen group as opposed to the aromatase inhibitors. After this discussion, she decided not to go that route.  The third way to reduce her cancer risk--not specific to breast cancer--is to optimize her diet and exercise routine.  Ideally she would be on a mostly vegetable diet and she would exercise 45 minutes 5 times a day.  This would reduce her risk of any cancer developing by anywhere between 1 and 3%.  Of course it would also increase her sense of well-being in general and reduce other risks including cardiac, clotting, and depression.  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  We then discussed  intensified screening.  This would include in addition to yearly mammography with tomography, biannual breast exams by  an MD, and a yearly breast MRI.  Rebecca Steele understands that adding the MRI puts her at risk for false positives. There are also issues related to cost. Nevertheless she is very motivated to proceed with this approach and we are operationalizing that..  In summary, we are setting Rebecca Steele up for intensified screening with a  breast MRI and breast exam in August. She then can have her regular mammogram in February and see her gynecologist shortly after for her biannual breast exam.   Rebecca Steele has a good understanding of this plan, She agrees with it. She knows the goal of these interventions is prevention. She will call with any problems that may develop before her return visit here.                  We spent the better part of today's 50-minute appointment discussing the biology of breast cancer in general, and the specifics of atypical ductal hyperplasia [ADH] more specifically. Rebecca Steele understands ADH means, first, relatively unrestricted growth of breast cells and, in addition, some morphologically different features from simple hyperplasia. Atypical ductal hyperplasia can be difficult to tell apart ductal cancer in situ. For those reasons the ADH lesionneeds to be removed.  ADH is also a marker of breast cancer risk. The risk of developing breast cancer in patients with ADH falls somewhere between one half and 1% per year. The new cancer can develop in either breast. One half of those tumors would be invasive.   Working from the patient's age to the average survival of women today in the [place] I think Zhane can expect to live an additional 25-30 years. Using the high end of the risk spectrum for purposes of discussion, this would give her a 20-30% chance of developing breast cancer in her lifetime. This is approximately 3 times the normal risk.  [place]ns has essentially two ways of lowering that risk. One of them is bilateral mastectomies. This is not recommended for this indication and I do not believe it would be covered by insurance even if she wanted it which she doesn't. The other, more reasonable approach would be to take anti-estrogens. This could be tamoxifen, raloxifene/Evista, or an aromatase inhibitor  We then discussed the possible toxicities, side effects and complications of the tamoxifen group as  opposed to the aromatase inhibitors. All information was given to Rebecca Steele in writing. Any of those agents if taken for 5 years would essentially cut the risk of developing a new breast cancer in half.  We also discussed intensified screening. This would be adding yearly MRI to yearly mammography with tomography. This approach greatly increases sensitivity. Any breast cancer that may develop should be found at the earliest possible stage. However intensified screening has not been documented to improve survival in this population. There are issues regarding cost even if insurance coverage is obtained. There also concerns regarding false positives especially in the first or second year of MRI screening.  Rebecca Steele has a good understanding of all these options. At a minimum she understands she needs to have yearly mammography with tomography and a yearly or biannual physician breast exam.   As for the additional options just discussed, she could not decide whether she wanted to pursue them further or not. If she decides to try and anti-estrogens she will let me know, I will be glad to prescribe it for her, and I would see her after a few months to make sure she is tolerating it well.   Otherwise I do  not feel further follow-up here would be useful, so no return appointment has been scheduled. I will be available to Rebecca Steele in the future if any further questions or concerns regarding breast cancer were to develop.      Rebecca Steele has a good understanding of the overall plan. She agrees with it. She knows the goal of treatment in her case is cure. She will call with any problems that may develop before her next visit here.  Rebecca Steele   03/19/2018 4:11 PM Medical Oncology and Hematology [facility] [address] [place], [place] [address] Tel. [phone]    Fax. [phone]

## 2018-03-19 ENCOUNTER — Inpatient Hospital Stay: Payer: Medicare HMO | Attending: Oncology | Admitting: Oncology

## 2018-03-19 DIAGNOSIS — N6091 Unspecified benign mammary dysplasia of right breast: Secondary | ICD-10-CM | POA: Diagnosis not present

## 2018-03-19 DIAGNOSIS — Z803 Family history of malignant neoplasm of breast: Secondary | ICD-10-CM | POA: Diagnosis not present

## 2018-04-04 ENCOUNTER — Other Ambulatory Visit: Payer: Self-pay

## 2018-04-04 DIAGNOSIS — Z1231 Encounter for screening mammogram for malignant neoplasm of breast: Secondary | ICD-10-CM

## 2018-04-16 ENCOUNTER — Telehealth: Payer: Self-pay | Admitting: Oncology

## 2018-04-16 DIAGNOSIS — R69 Illness, unspecified: Secondary | ICD-10-CM | POA: Diagnosis not present

## 2018-04-16 DIAGNOSIS — E039 Hypothyroidism, unspecified: Secondary | ICD-10-CM | POA: Diagnosis not present

## 2018-04-16 NOTE — Telephone Encounter (Signed)
Called patient regarding change of date

## 2018-04-17 DIAGNOSIS — M7541 Impingement syndrome of right shoulder: Secondary | ICD-10-CM | POA: Diagnosis not present

## 2018-04-17 DIAGNOSIS — E039 Hypothyroidism, unspecified: Secondary | ICD-10-CM | POA: Diagnosis not present

## 2018-04-17 DIAGNOSIS — G4700 Insomnia, unspecified: Secondary | ICD-10-CM | POA: Diagnosis not present

## 2018-04-17 DIAGNOSIS — Z6829 Body mass index (BMI) 29.0-29.9, adult: Secondary | ICD-10-CM | POA: Diagnosis not present

## 2018-04-17 DIAGNOSIS — I10 Essential (primary) hypertension: Secondary | ICD-10-CM | POA: Diagnosis not present

## 2018-04-17 DIAGNOSIS — M7542 Impingement syndrome of left shoulder: Secondary | ICD-10-CM | POA: Diagnosis not present

## 2018-04-23 ENCOUNTER — Inpatient Hospital Stay: Admission: RE | Admit: 2018-04-23 | Payer: Medicare HMO | Source: Ambulatory Visit

## 2018-04-24 ENCOUNTER — Other Ambulatory Visit: Payer: Self-pay | Admitting: Oncology

## 2018-04-24 DIAGNOSIS — Z1231 Encounter for screening mammogram for malignant neoplasm of breast: Secondary | ICD-10-CM

## 2018-04-26 ENCOUNTER — Other Ambulatory Visit: Payer: Self-pay | Admitting: *Deleted

## 2018-04-29 ENCOUNTER — Ambulatory Visit
Admission: RE | Admit: 2018-04-29 | Discharge: 2018-04-29 | Disposition: A | Discharge status: Home / Self Care | Payer: Medicare HMO | Source: Ambulatory Visit | Attending: Oncology | Admitting: Oncology

## 2018-04-29 DIAGNOSIS — Z853 Personal history of malignant neoplasm of breast: Secondary | ICD-10-CM | POA: Diagnosis not present

## 2018-04-29 MED ORDER — GADOBENATE DIMEGLUMINE 529 MG/ML IV SOLN
17.0000 mL | Freq: Once | INTRAVENOUS | Status: AC | PRN
  Administered 2018-04-29: 17 mL via INTRAVENOUS

## 2018-04-29 NOTE — Progress Notes (Signed)
Patient experienced urticaria and some throat discomfort at 3:55 PM after the administatration of IV gadolinium for breast MR.  Physical exam at 4:00 PM demonstrated facial urticaria. Neck and chest breath sounds clear without wheezing. BP - 160/90. Oxygen sat - 99-100%  Patient given 75 mg Benadryl IV and 125 mg Solu-Medrol IV at 4:05 PM.  Patient was observed for 2 hours and she stated that she felt much better over that time. At 5:55 PM -  Neck and chest breath sounds clear without wheezing. BP - 147/82. Oxygen sat - 97-100%  Patient and husband given instructions at discharge to home at 6:00 PM.

## 2018-04-30 ENCOUNTER — Other Ambulatory Visit: Payer: Self-pay

## 2018-04-30 ENCOUNTER — Encounter (HOSPITAL_BASED_OUTPATIENT_CLINIC_OR_DEPARTMENT_OTHER): Payer: Self-pay | Admitting: Emergency Medicine

## 2018-04-30 ENCOUNTER — Emergency Department (HOSPITAL_BASED_OUTPATIENT_CLINIC_OR_DEPARTMENT_OTHER)
Admission: EM | Admit: 2018-04-30 | Discharge: 2018-04-30 | Disposition: A | Discharge status: Home / Self Care | Payer: Medicare HMO | Attending: Emergency Medicine | Admitting: Emergency Medicine

## 2018-04-30 DIAGNOSIS — Z79899 Other long term (current) drug therapy: Secondary | ICD-10-CM | POA: Insufficient documentation

## 2018-04-30 DIAGNOSIS — T7840XA Allergy, unspecified, initial encounter: Secondary | ICD-10-CM | POA: Insufficient documentation

## 2018-04-30 DIAGNOSIS — R0602 Shortness of breath: Secondary | ICD-10-CM | POA: Diagnosis not present

## 2018-04-30 DIAGNOSIS — E039 Hypothyroidism, unspecified: Secondary | ICD-10-CM | POA: Insufficient documentation

## 2018-04-30 DIAGNOSIS — Z7982 Long term (current) use of aspirin: Secondary | ICD-10-CM | POA: Insufficient documentation

## 2018-04-30 DIAGNOSIS — I10 Essential (primary) hypertension: Secondary | ICD-10-CM | POA: Diagnosis not present

## 2018-04-30 DIAGNOSIS — T508X5A Adverse effect of diagnostic agents, initial encounter: Secondary | ICD-10-CM | POA: Diagnosis not present

## 2018-04-30 MED ORDER — METHYLPREDNISOLONE SODIUM SUCC 125 MG IJ SOLR
125.0000 mg | Freq: Once | INTRAMUSCULAR | Status: AC
  Administered 2018-04-30: 125 mg via INTRAVENOUS
  Filled 2018-04-30: qty 2

## 2018-04-30 MED ORDER — PREDNISONE 10 MG (21) PO TBPK
ORAL_TABLET | ORAL | 0 refills | Status: DC
Start: 2018-04-30 — End: 2021-06-06

## 2018-04-30 MED ORDER — SODIUM CHLORIDE 0.9 % IV SOLN: INTRAVENOUS | Status: DC | PRN

## 2018-04-30 MED ORDER — DIPHENHYDRAMINE HCL 50 MG/ML IJ SOLN
25.0000 mg | Freq: Once | INTRAMUSCULAR | Status: AC
Start: 2018-04-30 — End: 2018-04-30
  Administered 2018-04-30: 25 mg via INTRAVENOUS
  Filled 2018-04-30: qty 1

## 2018-04-30 MED ORDER — FAMOTIDINE IN NACL 20-0.9 MG/50ML-% IV SOLN
20.0000 mg | Freq: Once | INTRAVENOUS | Status: AC
  Administered 2018-04-30: 20 mg via INTRAVENOUS
  Filled 2018-04-30: qty 50

## 2018-04-30 MED FILL — predniSONE 10 MG TABS: 10 | 12 days supply | Qty: 42 | Fill #0

## 2018-04-30 NOTE — ED Triage Notes (Signed)
Patient states that she is very sensitive to medications - she had an MRI yesterday and states that she had an anaphylactic reaction to the contrast - she reports that she had a swollen scratchy throat. They gave her benadryl and a "cortison" shot. She states that she is better today but still has a "lump" feeling in her throat and feels like her face is all red. No noted redness by this RN - patient in no apparent distress - patient denies any Medication or Rx today with the exception of tylenol

## 2018-04-30 NOTE — ED Provider Notes (Signed)
[facility] Provider Note   CSN: [id number] Arrival date & time: 04/30/18  3532     History   Chief Complaint Chief Complaint  Patient presents with  . Allergic Reaction    HPI Rebecca Steele is a 67 y.o. female.  Pt has a hx of breast cancer who underwent right breast lumpectomy in March of 2019.  She was scheduled for a breast MRI for routine screening yesterday.  When she received the gadolinium contrast dye for the MRI, she developed an anaphylactic reaction.  The pt has a hx of multiple allergies and drug intolerances.  She was given benadryl and a cortisone shot and felt better.  However, today, her throat feels scratchy and her face feels red.  She has mild sob.  She was not given any meds to go home with after her rxn yesterday.     Past Medical History:  Diagnosis Date  . Breast mass, right   . Complication of anesthesia    states she has had 2 neck surgeries and her neck is stiff, hard to wake  . Depression   . Hematuria - cause not known   . Hypertension   . Hypothyroidism   . Kidney stones   . Mitral valve prolapse   . Renal disease   . 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 Obtundation 12/12/2017    Past Surgical History:  Procedure Laterality Date  . APPENDECTOMY    . CHOLECYSTECTOMY    . CYSTOSCOPY W/ RETROGRADES    . NECK SURGERY    . RADIOACTIVE SEED GUIDED EXCISIONAL BREAST BIOPSY Right 12/12/2017   Procedure: RIGHT RADIOACTIVE SEED GUIDED EXCISIONAL BREAST BIOPSY ERAS PATHWAY;  Surgeon: Rolm Bookbinder, MD;  Location: [facility];  Service: General;  Laterality: Right;  LMA  . SHOULDER SURGERY    . TONSILLECTOMY    . TRIGGER FINGER RELEASE Right 05/18/2015   Procedure: RIGHT  LONG FINGER TRIGGER RELEASE ;  Surgeon: Milly Jakob, MD;  Location: [facility];   Service: Orthopedics;  Laterality: Right;     OB History   None      Home Medications    Prior to Admission medications   Medication Sig Start Date End Date Taking? Authorizing Provider  acetaminophen (TYLENOL) 500 MG tablet Take 500-1,000 mg by mouth every 6 (six) hours as needed for headache (pain).    [provider]  aspirin EC 81 MG EC tablet Take 1 tablet (81 mg total) by mouth daily. 12/15/17   Rolm Bookbinder, MD  butalbital-acetaminophen-caffeine (ESGIC) [phone] MG tablet Take 1 tablet by mouth 2 (two) times daily as needed for headache or migraine.    [provider]  Cholecalciferol (VITAMIN D3) 1000 units CAPS Take by mouth daily.    [provider]  ibuprofen (ADVIL,MOTRIN) 200 MG tablet Take 200 mg by mouth every 6 (six) hours as needed for headache (pain).    [provider]  Levothyroxine Sodium 50 MCG CAPS Take 50 mcg by mouth daily before breakfast.     [provider]  predniSONE (STERAPRED UNI-PAK 21 TAB) 10 MG (21) TBPK tablet Take 6 tabs by mouth daily  for 2 days, then 5 tabs for 2 days, then 4 tabs for 2 days, then 3 tabs for 2 days, 2 tabs for 2 days,  then 1 tab by mouth daily for 2 days 04/30/18   Isla Pence, MD  sertraline (ZOLOFT) 100 MG tablet Take 100 mg by mouth daily.    [provider]  traZODone (DESYREL) 100 MG tablet Take 100 mg by mouth at bedtime.    [provider]  triamterene-hydrochlorothiazide (MAXZIDE-25) 37.5-25 MG tablet Take 0.5 tablets by mouth daily.  12/06/17   [provider]    Family History History reviewed. No pertinent family history.  Social History Social History   Tobacco Use  . Smoking status: Never Smoker  . Smokeless tobacco: Never Used  Substance Use Topics  . Alcohol use: No  . Drug use: No     Allergies   Gadolinium derivatives; Other; Valium; Doxycycline; Iohexol; Macrodantin; Red dye; Rocephin [ceftriaxone]; and Zithromax  [azithromycin]   Review of Systems Review of Systems  Respiratory: Positive for shortness of breath.   Allergic/Immunologic:       Facial and throat itching     Physical Exam Updated Vital Signs BP (!) 143/78 (BP Location: Right Arm)   Pulse 88   Resp 18   Ht 5\' 7"  (1.702 m)   Wt 84.4 kg (186 lb)   SpO2 100%   BMI 29.13 kg/m   Physical Exam  Constitutional: She is oriented to person, place, and time. She appears well-developed and well-nourished.  HENT:  Head: Normocephalic and atraumatic.  Right Ear: External ear normal.  Left Ear: External ear normal.  Nose: Nose normal.  Mouth/Throat: Oropharynx is clear and moist.  Eyes: Pupils are equal, round, and reactive to light. Conjunctivae and EOM are normal.  Neck: Normal range of motion. Neck supple.  Cardiovascular: Normal rate, regular rhythm, normal heart sounds and intact distal pulses.  Pulmonary/Chest: Effort normal and breath sounds normal.  Abdominal: Soft. Bowel sounds are normal.  Musculoskeletal: Normal range of motion.  Neurological: She is alert and oriented to person, place, and time.  Skin: Skin is warm. Capillary refill takes less than 2 seconds.  Psychiatric: She has a normal mood and affect. Her behavior is normal. Judgment and thought content normal.  Nursing note and vitals reviewed.    ED Treatments / Results  Labs (all labs ordered are listed, but only abnormal results are displayed) Labs Reviewed - No data to display  EKG None  Radiology No results found.  Procedures Procedures (including critical care time)  Medications Ordered in ED Medications  0.9 %  sodium chloride infusion (has no administration in time range)  diphenhydrAMINE (BENADRYL) injection 25 mg (25 mg Intravenous Given 04/30/18 1020)  famotidine (PEPCID) IVPB 20 mg premix (20 mg Intravenous New Bag/Given 04/30/18 1027)  methylPREDNISolone sodium succinate (SOLU-MEDROL) 125 mg/2 mL injection 125 mg (125 mg Intravenous Given  04/30/18 1020)     Initial Impression / Assessment and Plan / ED Course  I have reviewed the triage vital signs and the nursing notes.  Pertinent labs & imaging results that were available during my care of the patient were reviewed by me and considered in my medical decision making (see chart for details).    Pt is feeling much better.  She is encouraged to return if worse and to f/u with pcp.  Final Clinical Impressions(s) / ED Diagnoses   Final diagnoses:  Allergic reaction, initial encounter    ED Discharge Orders        Ordered    predniSONE (STERAPRED UNI-PAK 21 TAB) 10 MG (21) TBPK tablet     04/30/18 1106  Isla Pence, MD 04/30/18 [phone]

## 2018-04-30 NOTE — Discharge Instructions (Signed)
OTC benadryl or claritin/zyrtec today.  Continue to take benadryl tomorrow if you feel itchy or red.

## 2018-05-04 DIAGNOSIS — R0600 Dyspnea, unspecified: Secondary | ICD-10-CM | POA: Diagnosis not present

## 2018-05-04 DIAGNOSIS — R1310 Dysphagia, unspecified: Secondary | ICD-10-CM | POA: Diagnosis not present

## 2018-05-04 DIAGNOSIS — R5383 Other fatigue: Secondary | ICD-10-CM | POA: Diagnosis not present

## 2018-05-04 DIAGNOSIS — J040 Acute laryngitis: Secondary | ICD-10-CM | POA: Diagnosis not present

## 2018-05-04 DIAGNOSIS — Z6829 Body mass index (BMI) 29.0-29.9, adult: Secondary | ICD-10-CM | POA: Diagnosis not present

## 2018-05-07 ENCOUNTER — Other Ambulatory Visit: Payer: Self-pay | Admitting: Family Medicine

## 2018-05-07 DIAGNOSIS — R5383 Other fatigue: Secondary | ICD-10-CM | POA: Diagnosis not present

## 2018-05-07 DIAGNOSIS — R1310 Dysphagia, unspecified: Secondary | ICD-10-CM | POA: Diagnosis not present

## 2018-05-07 DIAGNOSIS — R0600 Dyspnea, unspecified: Secondary | ICD-10-CM

## 2018-05-07 DIAGNOSIS — Z6829 Body mass index (BMI) 29.0-29.9, adult: Secondary | ICD-10-CM | POA: Diagnosis not present

## 2018-05-07 DIAGNOSIS — J040 Acute laryngitis: Secondary | ICD-10-CM | POA: Diagnosis not present

## 2018-05-10 ENCOUNTER — Other Ambulatory Visit: Payer: Medicare HMO

## 2018-05-17 DIAGNOSIS — T782XXD Anaphylactic shock, unspecified, subsequent encounter: Secondary | ICD-10-CM | POA: Diagnosis not present

## 2018-05-17 DIAGNOSIS — Z23 Encounter for immunization: Secondary | ICD-10-CM | POA: Diagnosis not present

## 2018-05-17 DIAGNOSIS — R1310 Dysphagia, unspecified: Secondary | ICD-10-CM | POA: Diagnosis not present

## 2018-05-17 DIAGNOSIS — R5383 Other fatigue: Secondary | ICD-10-CM | POA: Diagnosis not present

## 2018-05-17 DIAGNOSIS — Z6829 Body mass index (BMI) 29.0-29.9, adult: Secondary | ICD-10-CM | POA: Diagnosis not present

## 2018-05-22 ENCOUNTER — Telehealth: Payer: Self-pay | Admitting: Oncology

## 2018-05-22 NOTE — Telephone Encounter (Signed)
Patient called to cancel °

## 2018-05-25 ENCOUNTER — Ambulatory Visit: Payer: Medicare HMO | Admitting: Oncology

## 2018-05-28 ENCOUNTER — Ambulatory Visit: Payer: Medicare HMO | Admitting: Oncology

## 2018-05-31 DIAGNOSIS — D1722 Benign lipomatous neoplasm of skin and subcutaneous tissue of left arm: Secondary | ICD-10-CM | POA: Diagnosis not present

## 2018-05-31 DIAGNOSIS — G8929 Other chronic pain: Secondary | ICD-10-CM | POA: Diagnosis not present

## 2018-06-05 DIAGNOSIS — Z0100 Encounter for examination of eyes and vision without abnormal findings: Secondary | ICD-10-CM | POA: Diagnosis not present

## 2018-06-05 DIAGNOSIS — H2513 Age-related nuclear cataract, bilateral: Secondary | ICD-10-CM | POA: Diagnosis not present

## 2018-06-13 DIAGNOSIS — G8929 Other chronic pain: Secondary | ICD-10-CM | POA: Diagnosis not present

## 2018-06-13 DIAGNOSIS — D1722 Benign lipomatous neoplasm of skin and subcutaneous tissue of left arm: Secondary | ICD-10-CM | POA: Diagnosis not present

## 2018-06-13 DIAGNOSIS — D2112 Benign neoplasm of connective and other soft tissue of left upper limb, including shoulder: Secondary | ICD-10-CM | POA: Diagnosis not present

## 2018-06-27 DIAGNOSIS — D1722 Benign lipomatous neoplasm of skin and subcutaneous tissue of left arm: Secondary | ICD-10-CM | POA: Diagnosis not present

## 2018-06-27 DIAGNOSIS — G8929 Other chronic pain: Secondary | ICD-10-CM | POA: Diagnosis not present

## 2018-07-12 DIAGNOSIS — I10 Essential (primary) hypertension: Secondary | ICD-10-CM | POA: Diagnosis not present

## 2018-07-12 DIAGNOSIS — R946 Abnormal results of thyroid function studies: Secondary | ICD-10-CM | POA: Diagnosis not present

## 2018-07-13 DIAGNOSIS — Z6829 Body mass index (BMI) 29.0-29.9, adult: Secondary | ICD-10-CM | POA: Diagnosis not present

## 2018-07-13 DIAGNOSIS — E039 Hypothyroidism, unspecified: Secondary | ICD-10-CM | POA: Diagnosis not present

## 2018-07-13 DIAGNOSIS — I10 Essential (primary) hypertension: Secondary | ICD-10-CM | POA: Diagnosis not present

## 2018-07-13 DIAGNOSIS — M7541 Impingement syndrome of right shoulder: Secondary | ICD-10-CM | POA: Diagnosis not present

## 2018-07-13 DIAGNOSIS — M7542 Impingement syndrome of left shoulder: Secondary | ICD-10-CM | POA: Diagnosis not present

## 2018-07-13 DIAGNOSIS — E876 Hypokalemia: Secondary | ICD-10-CM | POA: Diagnosis not present

## 2018-07-13 DIAGNOSIS — J309 Allergic rhinitis, unspecified: Secondary | ICD-10-CM | POA: Diagnosis not present

## 2018-09-05 DIAGNOSIS — L600 Ingrowing nail: Secondary | ICD-10-CM | POA: Diagnosis not present

## 2018-09-17 DIAGNOSIS — I10 Essential (primary) hypertension: Secondary | ICD-10-CM | POA: Diagnosis not present

## 2018-09-17 DIAGNOSIS — E782 Mixed hyperlipidemia: Secondary | ICD-10-CM | POA: Diagnosis not present

## 2018-09-17 DIAGNOSIS — R946 Abnormal results of thyroid function studies: Secondary | ICD-10-CM | POA: Diagnosis not present

## 2018-09-18 DIAGNOSIS — Z23 Encounter for immunization: Secondary | ICD-10-CM | POA: Diagnosis not present

## 2018-09-18 DIAGNOSIS — Z6829 Body mass index (BMI) 29.0-29.9, adult: Secondary | ICD-10-CM | POA: Diagnosis not present

## 2018-09-18 DIAGNOSIS — E039 Hypothyroidism, unspecified: Secondary | ICD-10-CM | POA: Diagnosis not present

## 2018-09-18 DIAGNOSIS — I10 Essential (primary) hypertension: Secondary | ICD-10-CM | POA: Diagnosis not present

## 2018-09-18 DIAGNOSIS — Z0000 Encounter for general adult medical examination without abnormal findings: Secondary | ICD-10-CM | POA: Diagnosis not present

## 2018-09-18 DIAGNOSIS — E876 Hypokalemia: Secondary | ICD-10-CM | POA: Diagnosis not present

## 2018-10-18 DIAGNOSIS — R69 Illness, unspecified: Secondary | ICD-10-CM | POA: Diagnosis not present

## 2018-10-30 DIAGNOSIS — R600 Localized edema: Secondary | ICD-10-CM | POA: Diagnosis not present

## 2018-10-30 DIAGNOSIS — Z6829 Body mass index (BMI) 29.0-29.9, adult: Secondary | ICD-10-CM | POA: Diagnosis not present

## 2018-10-30 DIAGNOSIS — I10 Essential (primary) hypertension: Secondary | ICD-10-CM | POA: Diagnosis not present

## 2018-10-30 DIAGNOSIS — E876 Hypokalemia: Secondary | ICD-10-CM | POA: Diagnosis not present

## 2018-11-12 DIAGNOSIS — L301 Dyshidrosis [pompholyx]: Secondary | ICD-10-CM | POA: Diagnosis not present

## 2018-11-28 DIAGNOSIS — E782 Mixed hyperlipidemia: Secondary | ICD-10-CM | POA: Diagnosis not present

## 2018-11-28 DIAGNOSIS — E876 Hypokalemia: Secondary | ICD-10-CM | POA: Diagnosis not present

## 2018-11-28 DIAGNOSIS — I10 Essential (primary) hypertension: Secondary | ICD-10-CM | POA: Diagnosis not present

## 2018-11-30 DIAGNOSIS — E782 Mixed hyperlipidemia: Secondary | ICD-10-CM | POA: Diagnosis not present

## 2018-11-30 DIAGNOSIS — Z6828 Body mass index (BMI) 28.0-28.9, adult: Secondary | ICD-10-CM | POA: Diagnosis not present

## 2018-11-30 DIAGNOSIS — E876 Hypokalemia: Secondary | ICD-10-CM | POA: Diagnosis not present

## 2018-11-30 DIAGNOSIS — I10 Essential (primary) hypertension: Secondary | ICD-10-CM | POA: Diagnosis not present

## 2018-12-18 DIAGNOSIS — L301 Dyshidrosis [pompholyx]: Secondary | ICD-10-CM | POA: Diagnosis not present

## 2019-01-17 DIAGNOSIS — L309 Dermatitis, unspecified: Secondary | ICD-10-CM | POA: Diagnosis not present

## 2019-01-17 DIAGNOSIS — Z719 Counseling, unspecified: Secondary | ICD-10-CM | POA: Diagnosis not present

## 2019-01-17 DIAGNOSIS — L989 Disorder of the skin and subcutaneous tissue, unspecified: Secondary | ICD-10-CM | POA: Diagnosis not present

## 2019-01-24 DIAGNOSIS — G4700 Insomnia, unspecified: Secondary | ICD-10-CM | POA: Diagnosis not present

## 2019-01-24 DIAGNOSIS — R69 Illness, unspecified: Secondary | ICD-10-CM | POA: Diagnosis not present

## 2019-01-24 DIAGNOSIS — L309 Dermatitis, unspecified: Secondary | ICD-10-CM | POA: Diagnosis not present

## 2019-01-24 DIAGNOSIS — I10 Essential (primary) hypertension: Secondary | ICD-10-CM | POA: Diagnosis not present

## 2019-04-23 DIAGNOSIS — M9905 Segmental and somatic dysfunction of pelvic region: Secondary | ICD-10-CM | POA: Diagnosis not present

## 2019-04-23 DIAGNOSIS — M461 Sacroiliitis, not elsewhere classified: Secondary | ICD-10-CM | POA: Diagnosis not present

## 2019-04-23 DIAGNOSIS — R69 Illness, unspecified: Secondary | ICD-10-CM | POA: Diagnosis not present

## 2019-05-30 DIAGNOSIS — E782 Mixed hyperlipidemia: Secondary | ICD-10-CM | POA: Diagnosis not present

## 2019-05-30 DIAGNOSIS — I10 Essential (primary) hypertension: Secondary | ICD-10-CM | POA: Diagnosis not present

## 2019-05-30 DIAGNOSIS — Z23 Encounter for immunization: Secondary | ICD-10-CM | POA: Diagnosis not present

## 2019-05-30 DIAGNOSIS — R946 Abnormal results of thyroid function studies: Secondary | ICD-10-CM | POA: Diagnosis not present

## 2019-05-30 DIAGNOSIS — E876 Hypokalemia: Secondary | ICD-10-CM | POA: Diagnosis not present

## 2019-06-03 DIAGNOSIS — E876 Hypokalemia: Secondary | ICD-10-CM | POA: Diagnosis not present

## 2019-06-03 DIAGNOSIS — I10 Essential (primary) hypertension: Secondary | ICD-10-CM | POA: Diagnosis not present

## 2019-06-03 DIAGNOSIS — E782 Mixed hyperlipidemia: Secondary | ICD-10-CM | POA: Diagnosis not present

## 2019-06-03 DIAGNOSIS — E039 Hypothyroidism, unspecified: Secondary | ICD-10-CM | POA: Diagnosis not present

## 2019-06-12 DIAGNOSIS — I10 Essential (primary) hypertension: Secondary | ICD-10-CM | POA: Diagnosis not present

## 2019-06-12 DIAGNOSIS — E039 Hypothyroidism, unspecified: Secondary | ICD-10-CM | POA: Diagnosis not present

## 2019-06-12 DIAGNOSIS — G43009 Migraine without aura, not intractable, without status migrainosus: Secondary | ICD-10-CM | POA: Diagnosis not present

## 2019-06-18 DIAGNOSIS — H2513 Age-related nuclear cataract, bilateral: Secondary | ICD-10-CM | POA: Diagnosis not present

## 2019-06-20 DIAGNOSIS — Z0100 Encounter for examination of eyes and vision without abnormal findings: Secondary | ICD-10-CM | POA: Diagnosis not present

## 2019-07-08 IMAGING — [facility] DIGITAL DIAGNOSTIC UNILATERAL RIGHT MAMMOGRAM WITH TOMO AND CAD
4 series · 4 of 12 positions shown · non-contrast
Comparison: Previous exam(s).

CLINICAL DATA: Screening recall for possible architectural
distortion in the right breast.

EXAM:
DIGITAL DIAGNOSTIC RIGHT MAMMOGRAM WITH CAD AND TOMO
ULTRASOUND RIGHT BREAST

[R CC synth-2D]
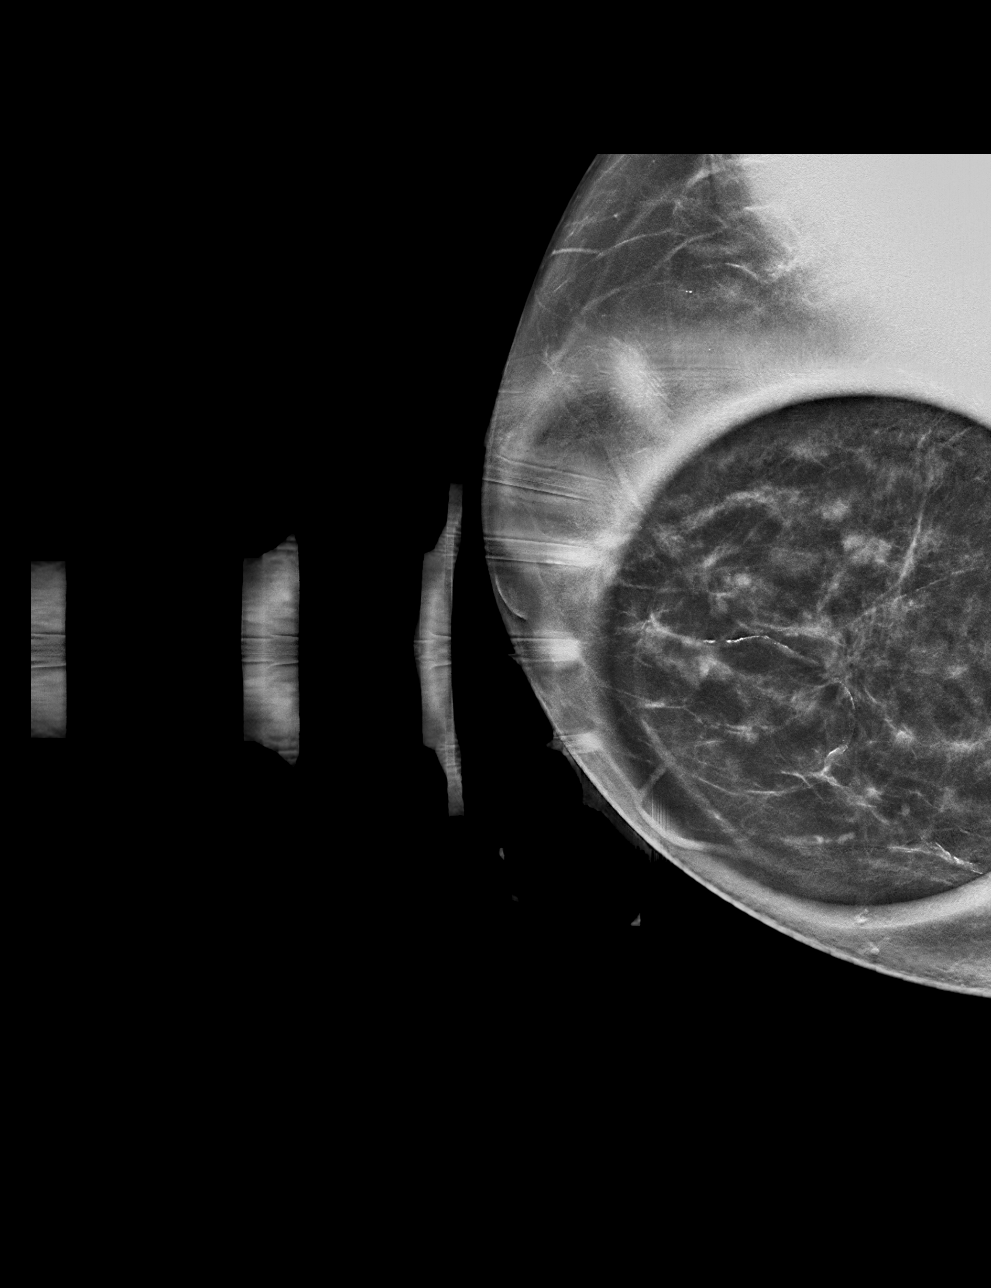

[R MLO synth-2D]
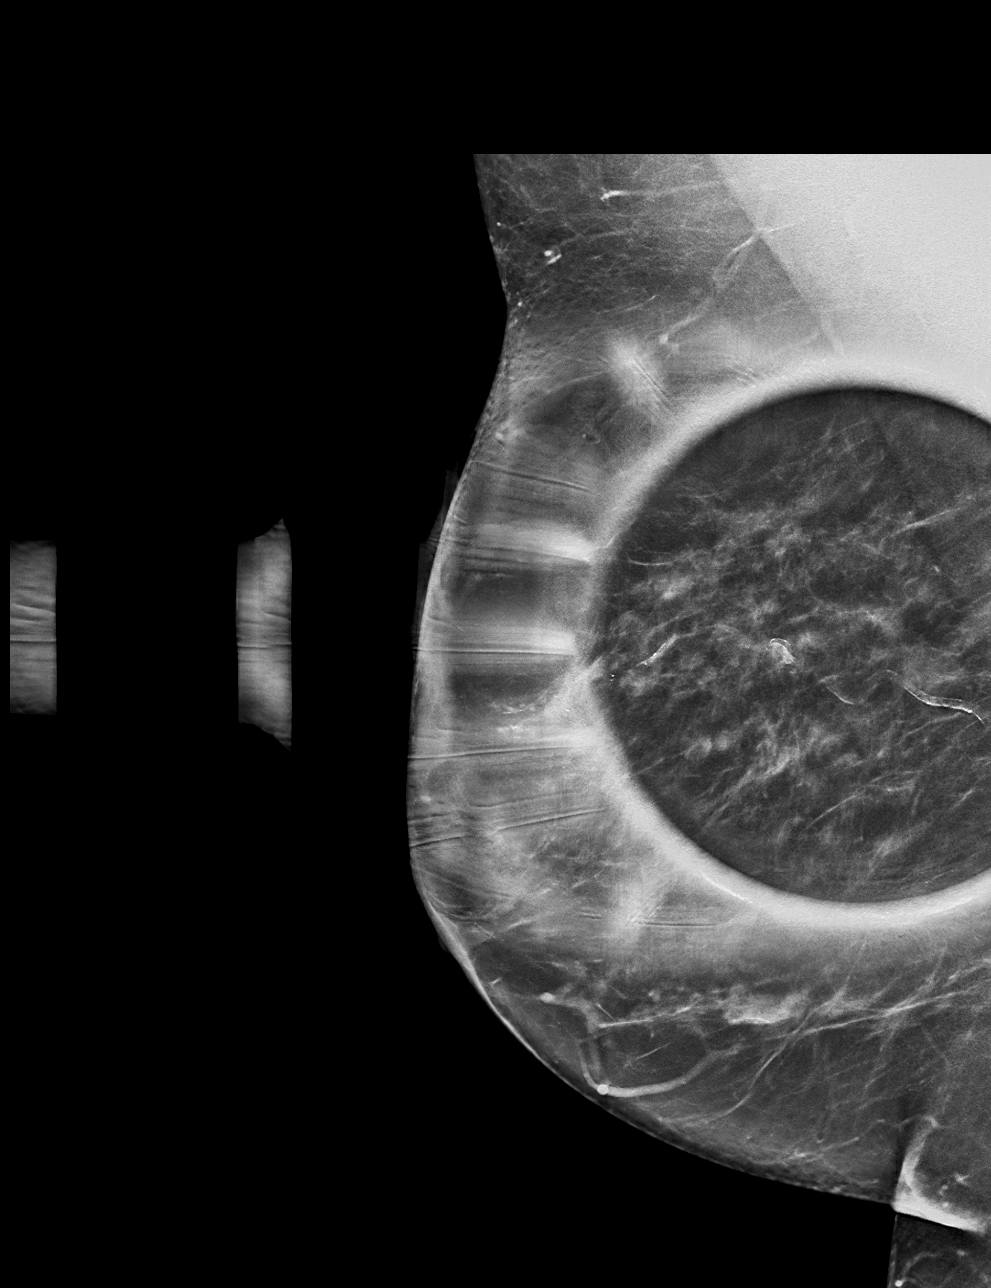

[R MLO tomo · tomo slice 35/70.0]
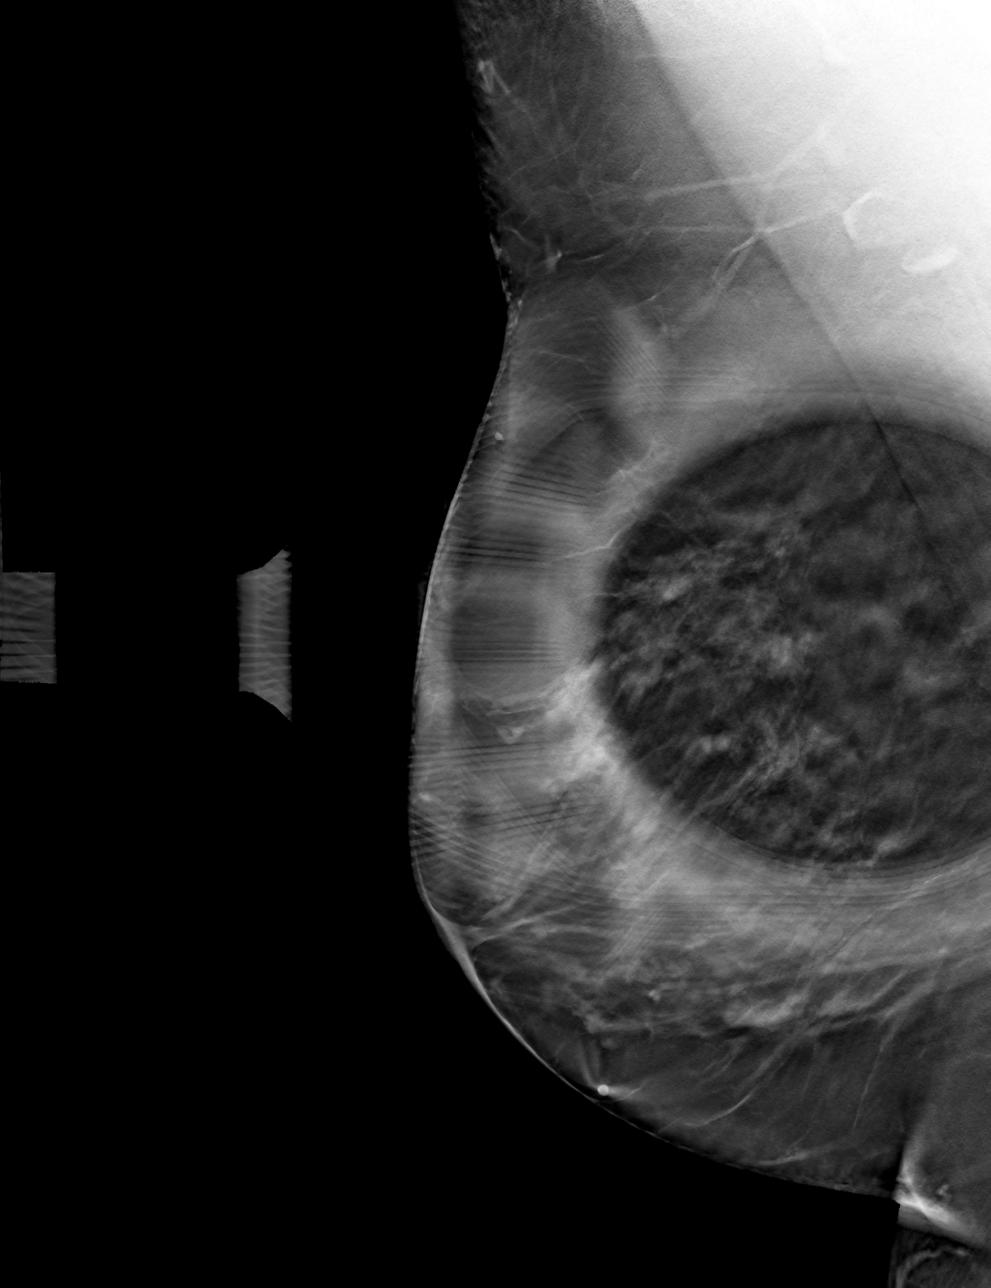

[R CC tomo · tomo slice 33/66.0]
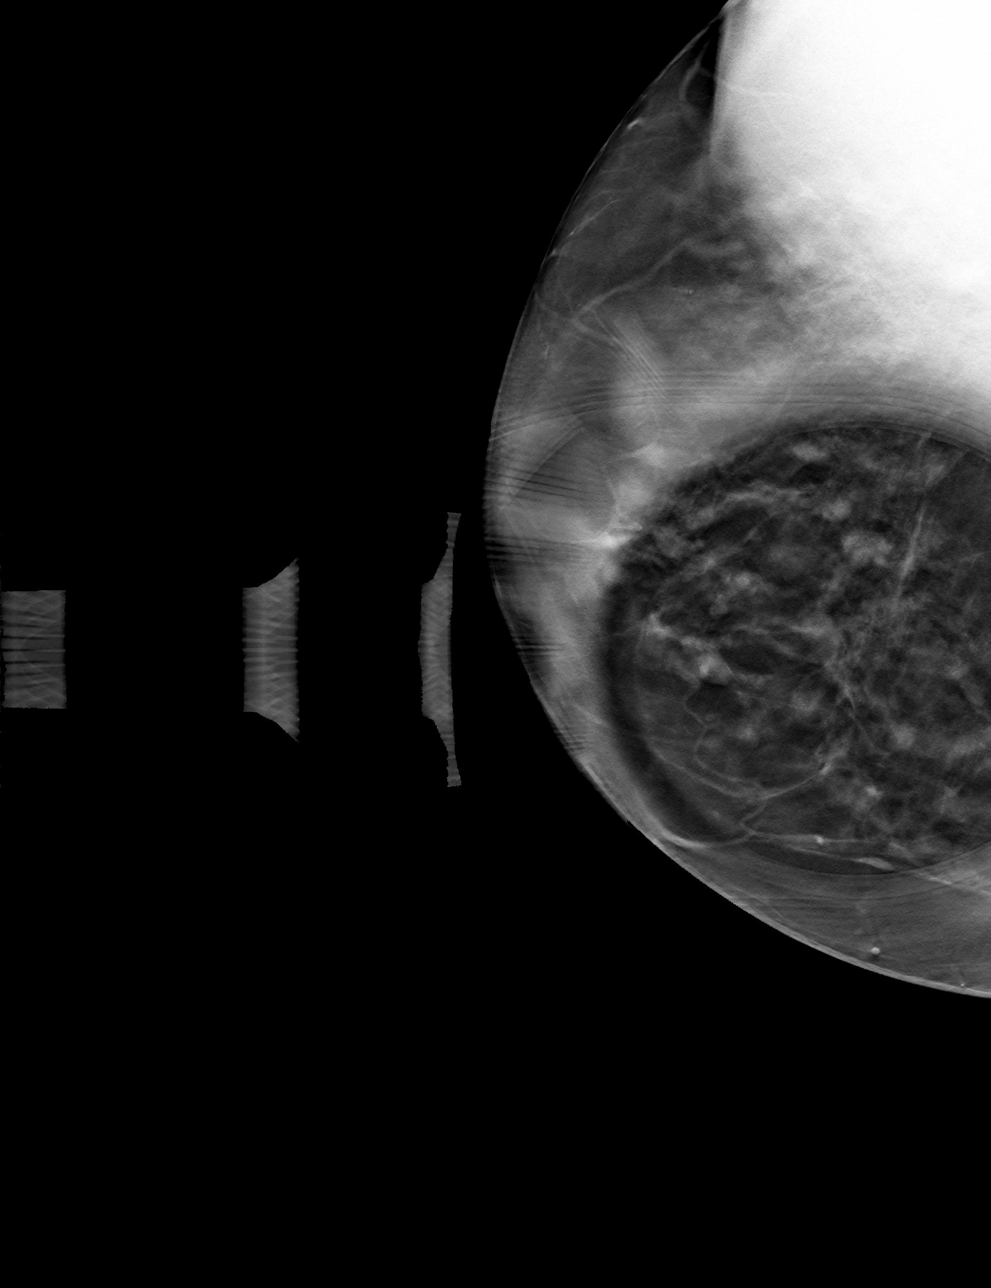

[4 of 12 positions shown; findings below may reference images not displayed]

ACR Breast Density Category c: The breast tissue is heterogeneously
dense, which may obscure small masses.
FINDINGS: In the medial aspect of the right breast, a small subtle
architectural distortion persists, most evident on the CC view.
There is no defined mass within the area of distortion. There are no
suspicious calcifications. There are several adjacent circumscribed
masses similar to prior mammograms, consistent with benign lesions,
likely cysts.

Mammographic images were processed with CAD.

On physical exam, no mass is palpated in the medial right breast.

Targeted ultrasound is performed, showing several small cysts
throughout the medial aspect of the right breast, but no solid
masses and no convincing sonographic architectural distortion.

Sonographic evaluation of the right axilla shows no enlarged or
abnormal lymph nodes.
IMPRESSION: 1. Suspicious architectural distortion in the medial right breast.
Biopsy is indicated.

RECOMMENDATION:
3D for stereotactic core needle biopsy of the right breast
architectural distortion.

I have discussed the findings and recommendations with the patient.
Results were also provided in writing at the conclusion of the
visit. If applicable, a reminder letter will be sent to the patient
regarding the next appointment.

BI-RADS CATEGORY  4: Suspicious.

## 2019-07-11 DIAGNOSIS — E876 Hypokalemia: Secondary | ICD-10-CM | POA: Diagnosis not present

## 2019-07-11 DIAGNOSIS — R946 Abnormal results of thyroid function studies: Secondary | ICD-10-CM | POA: Diagnosis not present

## 2019-07-11 DIAGNOSIS — E782 Mixed hyperlipidemia: Secondary | ICD-10-CM | POA: Diagnosis not present

## 2019-07-11 DIAGNOSIS — I10 Essential (primary) hypertension: Secondary | ICD-10-CM | POA: Diagnosis not present

## 2019-07-15 DIAGNOSIS — E039 Hypothyroidism, unspecified: Secondary | ICD-10-CM | POA: Diagnosis not present

## 2019-07-15 DIAGNOSIS — E876 Hypokalemia: Secondary | ICD-10-CM | POA: Diagnosis not present

## 2019-07-15 DIAGNOSIS — I10 Essential (primary) hypertension: Secondary | ICD-10-CM | POA: Diagnosis not present

## 2019-08-06 DIAGNOSIS — M7542 Impingement syndrome of left shoulder: Secondary | ICD-10-CM | POA: Diagnosis not present

## 2019-08-06 DIAGNOSIS — M542 Cervicalgia: Secondary | ICD-10-CM | POA: Diagnosis not present

## 2019-08-06 DIAGNOSIS — M549 Dorsalgia, unspecified: Secondary | ICD-10-CM | POA: Diagnosis not present

## 2019-08-27 DIAGNOSIS — M7542 Impingement syndrome of left shoulder: Secondary | ICD-10-CM | POA: Diagnosis not present

## 2019-08-27 DIAGNOSIS — M542 Cervicalgia: Secondary | ICD-10-CM | POA: Diagnosis not present

## 2019-08-27 DIAGNOSIS — M549 Dorsalgia, unspecified: Secondary | ICD-10-CM | POA: Diagnosis not present

## 2019-10-10 ENCOUNTER — Other Ambulatory Visit: Payer: Self-pay | Admitting: Family Medicine

## 2019-10-10 DIAGNOSIS — Z0000 Encounter for general adult medical examination without abnormal findings: Secondary | ICD-10-CM | POA: Diagnosis not present

## 2019-10-10 DIAGNOSIS — I10 Essential (primary) hypertension: Secondary | ICD-10-CM | POA: Diagnosis not present

## 2019-10-10 DIAGNOSIS — E2839 Other primary ovarian failure: Secondary | ICD-10-CM

## 2019-10-10 DIAGNOSIS — E876 Hypokalemia: Secondary | ICD-10-CM | POA: Diagnosis not present

## 2019-10-10 DIAGNOSIS — E782 Mixed hyperlipidemia: Secondary | ICD-10-CM | POA: Diagnosis not present

## 2019-10-10 DIAGNOSIS — Z1231 Encounter for screening mammogram for malignant neoplasm of breast: Secondary | ICD-10-CM

## 2019-10-29 DIAGNOSIS — R197 Diarrhea, unspecified: Secondary | ICD-10-CM | POA: Diagnosis not present

## 2019-10-29 DIAGNOSIS — R946 Abnormal results of thyroid function studies: Secondary | ICD-10-CM | POA: Diagnosis not present

## 2019-10-29 DIAGNOSIS — R1310 Dysphagia, unspecified: Secondary | ICD-10-CM | POA: Diagnosis not present

## 2019-10-29 DIAGNOSIS — E876 Hypokalemia: Secondary | ICD-10-CM | POA: Diagnosis not present

## 2019-11-01 DIAGNOSIS — E876 Hypokalemia: Secondary | ICD-10-CM | POA: Diagnosis not present

## 2019-11-01 DIAGNOSIS — J309 Allergic rhinitis, unspecified: Secondary | ICD-10-CM | POA: Diagnosis not present

## 2019-11-01 DIAGNOSIS — E039 Hypothyroidism, unspecified: Secondary | ICD-10-CM | POA: Diagnosis not present

## 2019-11-01 DIAGNOSIS — R600 Localized edema: Secondary | ICD-10-CM | POA: Diagnosis not present

## 2019-12-09 DIAGNOSIS — K529 Noninfective gastroenteritis and colitis, unspecified: Secondary | ICD-10-CM | POA: Diagnosis not present

## 2019-12-09 DIAGNOSIS — Z9049 Acquired absence of other specified parts of digestive tract: Secondary | ICD-10-CM | POA: Diagnosis not present

## 2019-12-24 DIAGNOSIS — E782 Mixed hyperlipidemia: Secondary | ICD-10-CM | POA: Diagnosis not present

## 2019-12-24 DIAGNOSIS — I10 Essential (primary) hypertension: Secondary | ICD-10-CM | POA: Diagnosis not present

## 2019-12-24 DIAGNOSIS — E559 Vitamin D deficiency, unspecified: Secondary | ICD-10-CM | POA: Diagnosis not present

## 2019-12-24 DIAGNOSIS — R197 Diarrhea, unspecified: Secondary | ICD-10-CM | POA: Diagnosis not present

## 2019-12-27 ENCOUNTER — Other Ambulatory Visit: Payer: Medicare HMO

## 2019-12-27 ENCOUNTER — Ambulatory Visit: Payer: Medicare HMO

## 2019-12-27 DIAGNOSIS — E782 Mixed hyperlipidemia: Secondary | ICD-10-CM | POA: Diagnosis not present

## 2019-12-27 DIAGNOSIS — R6889 Other general symptoms and signs: Secondary | ICD-10-CM | POA: Diagnosis not present

## 2019-12-27 DIAGNOSIS — J309 Allergic rhinitis, unspecified: Secondary | ICD-10-CM | POA: Diagnosis not present

## 2019-12-27 DIAGNOSIS — I10 Essential (primary) hypertension: Secondary | ICD-10-CM | POA: Diagnosis not present

## 2020-01-10 ENCOUNTER — Ambulatory Visit: Payer: Medicare HMO

## 2020-01-10 ENCOUNTER — Other Ambulatory Visit: Payer: Medicare HMO

## 2020-02-13 ENCOUNTER — Ambulatory Visit
Admission: RE | Admit: 2020-02-13 | Discharge: 2020-02-13 | Disposition: A | Discharge status: Home / Self Care | Payer: Medicare HMO | Source: Ambulatory Visit | Attending: Family Medicine | Admitting: Family Medicine

## 2020-02-13 ENCOUNTER — Other Ambulatory Visit: Payer: Self-pay

## 2020-02-13 DIAGNOSIS — E2839 Other primary ovarian failure: Secondary | ICD-10-CM

## 2020-02-13 DIAGNOSIS — Z1231 Encounter for screening mammogram for malignant neoplasm of breast: Secondary | ICD-10-CM | POA: Diagnosis not present

## 2020-02-14 DIAGNOSIS — M810 Age-related osteoporosis without current pathological fracture: Secondary | ICD-10-CM | POA: Diagnosis not present

## 2020-02-14 DIAGNOSIS — M25551 Pain in right hip: Secondary | ICD-10-CM | POA: Diagnosis not present

## 2020-02-14 DIAGNOSIS — E559 Vitamin D deficiency, unspecified: Secondary | ICD-10-CM | POA: Diagnosis not present

## 2020-02-17 ENCOUNTER — Other Ambulatory Visit: Payer: Self-pay | Admitting: Family Medicine

## 2020-02-17 ENCOUNTER — Ambulatory Visit
Admission: RE | Admit: 2020-02-17 | Discharge: 2020-02-17 | Disposition: A | Discharge status: Home / Self Care | Payer: Medicare HMO | Source: Ambulatory Visit | Attending: Family Medicine | Admitting: Family Medicine

## 2020-02-17 DIAGNOSIS — M25851 Other specified joint disorders, right hip: Secondary | ICD-10-CM | POA: Diagnosis not present

## 2020-02-17 DIAGNOSIS — M898X5 Other specified disorders of bone, thigh: Secondary | ICD-10-CM

## 2020-02-17 DIAGNOSIS — M7918 Myalgia, other site: Secondary | ICD-10-CM

## 2020-02-19 ENCOUNTER — Other Ambulatory Visit: Payer: Self-pay | Admitting: Family Medicine

## 2020-02-19 DIAGNOSIS — M899 Disorder of bone, unspecified: Secondary | ICD-10-CM

## 2020-02-20 DIAGNOSIS — R69 Illness, unspecified: Secondary | ICD-10-CM | POA: Diagnosis not present

## 2020-02-24 ENCOUNTER — Ambulatory Visit
Admission: RE | Admit: 2020-02-24 | Discharge: 2020-02-24 | Disposition: A | Discharge status: Home / Self Care | Payer: Medicare HMO | Source: Ambulatory Visit | Attending: Family Medicine | Admitting: Family Medicine

## 2020-02-24 ENCOUNTER — Other Ambulatory Visit: Payer: Self-pay

## 2020-02-24 DIAGNOSIS — M1611 Unilateral primary osteoarthritis, right hip: Secondary | ICD-10-CM | POA: Diagnosis not present

## 2020-02-24 DIAGNOSIS — M899 Disorder of bone, unspecified: Secondary | ICD-10-CM

## 2020-03-06 ENCOUNTER — Other Ambulatory Visit: Payer: Medicare HMO

## 2020-03-10 DIAGNOSIS — L570 Actinic keratosis: Secondary | ICD-10-CM | POA: Diagnosis not present

## 2020-03-16 DIAGNOSIS — J40 Bronchitis, not specified as acute or chronic: Secondary | ICD-10-CM | POA: Diagnosis not present

## 2020-03-16 DIAGNOSIS — J309 Allergic rhinitis, unspecified: Secondary | ICD-10-CM | POA: Diagnosis not present

## 2020-03-16 DIAGNOSIS — J069 Acute upper respiratory infection, unspecified: Secondary | ICD-10-CM | POA: Diagnosis not present

## 2020-03-19 DIAGNOSIS — E782 Mixed hyperlipidemia: Secondary | ICD-10-CM | POA: Diagnosis not present

## 2020-03-19 DIAGNOSIS — I10 Essential (primary) hypertension: Secondary | ICD-10-CM | POA: Diagnosis not present

## 2020-03-23 DIAGNOSIS — E1169 Type 2 diabetes mellitus with other specified complication: Secondary | ICD-10-CM | POA: Diagnosis not present

## 2020-03-23 DIAGNOSIS — G4700 Insomnia, unspecified: Secondary | ICD-10-CM | POA: Diagnosis not present

## 2020-03-23 DIAGNOSIS — R69 Illness, unspecified: Secondary | ICD-10-CM | POA: Diagnosis not present

## 2020-03-23 DIAGNOSIS — E782 Mixed hyperlipidemia: Secondary | ICD-10-CM | POA: Diagnosis not present

## 2020-03-23 DIAGNOSIS — I10 Essential (primary) hypertension: Secondary | ICD-10-CM | POA: Diagnosis not present

## 2020-03-23 DIAGNOSIS — E663 Overweight: Secondary | ICD-10-CM | POA: Diagnosis not present

## 2020-05-21 DIAGNOSIS — M25651 Stiffness of right hip, not elsewhere classified: Secondary | ICD-10-CM | POA: Diagnosis not present

## 2020-05-21 DIAGNOSIS — M7061 Trochanteric bursitis, right hip: Secondary | ICD-10-CM | POA: Diagnosis not present

## 2020-05-21 DIAGNOSIS — M6281 Muscle weakness (generalized): Secondary | ICD-10-CM | POA: Diagnosis not present

## 2020-05-21 DIAGNOSIS — M25551 Pain in right hip: Secondary | ICD-10-CM | POA: Diagnosis not present

## 2020-06-03 DIAGNOSIS — Z23 Encounter for immunization: Secondary | ICD-10-CM | POA: Diagnosis not present

## 2020-07-06 DIAGNOSIS — I10 Essential (primary) hypertension: Secondary | ICD-10-CM | POA: Diagnosis not present

## 2020-07-06 DIAGNOSIS — E559 Vitamin D deficiency, unspecified: Secondary | ICD-10-CM | POA: Diagnosis not present

## 2020-07-08 DIAGNOSIS — E663 Overweight: Secondary | ICD-10-CM | POA: Diagnosis not present

## 2020-07-08 DIAGNOSIS — E559 Vitamin D deficiency, unspecified: Secondary | ICD-10-CM | POA: Diagnosis not present

## 2020-07-08 DIAGNOSIS — I10 Essential (primary) hypertension: Secondary | ICD-10-CM | POA: Diagnosis not present

## 2020-09-07 DIAGNOSIS — R69 Illness, unspecified: Secondary | ICD-10-CM | POA: Diagnosis not present

## 2020-10-12 DIAGNOSIS — Z1339 Encounter for screening examination for other mental health and behavioral disorders: Secondary | ICD-10-CM | POA: Diagnosis not present

## 2020-10-12 DIAGNOSIS — Z0000 Encounter for general adult medical examination without abnormal findings: Secondary | ICD-10-CM | POA: Diagnosis not present

## 2020-10-12 DIAGNOSIS — Z1331 Encounter for screening for depression: Secondary | ICD-10-CM | POA: Diagnosis not present

## 2020-10-13 DIAGNOSIS — R1030 Lower abdominal pain, unspecified: Secondary | ICD-10-CM | POA: Diagnosis not present

## 2020-10-13 DIAGNOSIS — M5416 Radiculopathy, lumbar region: Secondary | ICD-10-CM | POA: Diagnosis not present

## 2020-10-13 DIAGNOSIS — M899 Disorder of bone, unspecified: Secondary | ICD-10-CM | POA: Diagnosis not present

## 2020-10-13 DIAGNOSIS — M545 Low back pain, unspecified: Secondary | ICD-10-CM | POA: Diagnosis not present

## 2020-10-14 ENCOUNTER — Other Ambulatory Visit: Payer: Self-pay | Admitting: Family Medicine

## 2020-10-14 DIAGNOSIS — M899 Disorder of bone, unspecified: Secondary | ICD-10-CM

## 2020-10-14 DIAGNOSIS — M5416 Radiculopathy, lumbar region: Secondary | ICD-10-CM

## 2020-10-21 ENCOUNTER — Other Ambulatory Visit: Payer: Self-pay

## 2020-10-21 ENCOUNTER — Ambulatory Visit
Admission: RE | Admit: 2020-10-21 | Discharge: 2020-10-21 | Disposition: A | Discharge status: Home / Self Care | Payer: Medicare HMO | Source: Ambulatory Visit | Attending: Family Medicine | Admitting: Family Medicine

## 2020-10-21 DIAGNOSIS — M1611 Unilateral primary osteoarthritis, right hip: Secondary | ICD-10-CM | POA: Diagnosis not present

## 2020-10-21 DIAGNOSIS — Z853 Personal history of malignant neoplasm of breast: Secondary | ICD-10-CM | POA: Diagnosis not present

## 2020-10-21 DIAGNOSIS — R600 Localized edema: Secondary | ICD-10-CM | POA: Diagnosis not present

## 2020-10-21 DIAGNOSIS — M5416 Radiculopathy, lumbar region: Secondary | ICD-10-CM

## 2020-10-21 DIAGNOSIS — M898X5 Other specified disorders of bone, thigh: Secondary | ICD-10-CM | POA: Diagnosis not present

## 2020-10-21 DIAGNOSIS — M899 Disorder of bone, unspecified: Secondary | ICD-10-CM

## 2020-10-21 DIAGNOSIS — M545 Low back pain, unspecified: Secondary | ICD-10-CM | POA: Diagnosis not present

## 2020-10-27 DIAGNOSIS — M479 Spondylosis, unspecified: Secondary | ICD-10-CM | POA: Diagnosis not present

## 2020-10-27 DIAGNOSIS — M1611 Unilateral primary osteoarthritis, right hip: Secondary | ICD-10-CM | POA: Diagnosis not present

## 2020-11-27 DIAGNOSIS — M7061 Trochanteric bursitis, right hip: Secondary | ICD-10-CM | POA: Diagnosis not present

## 2020-11-27 DIAGNOSIS — M2241 Chondromalacia patellae, right knee: Secondary | ICD-10-CM | POA: Diagnosis not present

## 2021-03-04 DIAGNOSIS — E039 Hypothyroidism, unspecified: Secondary | ICD-10-CM | POA: Diagnosis not present

## 2021-03-04 DIAGNOSIS — E782 Mixed hyperlipidemia: Secondary | ICD-10-CM | POA: Diagnosis not present

## 2021-03-04 DIAGNOSIS — E559 Vitamin D deficiency, unspecified: Secondary | ICD-10-CM | POA: Diagnosis not present

## 2021-03-04 DIAGNOSIS — I10 Essential (primary) hypertension: Secondary | ICD-10-CM | POA: Diagnosis not present

## 2021-03-08 DIAGNOSIS — E782 Mixed hyperlipidemia: Secondary | ICD-10-CM | POA: Diagnosis not present

## 2021-03-08 DIAGNOSIS — E559 Vitamin D deficiency, unspecified: Secondary | ICD-10-CM | POA: Diagnosis not present

## 2021-03-08 DIAGNOSIS — Z23 Encounter for immunization: Secondary | ICD-10-CM | POA: Diagnosis not present

## 2021-03-08 DIAGNOSIS — E1169 Type 2 diabetes mellitus with other specified complication: Secondary | ICD-10-CM | POA: Diagnosis not present

## 2021-03-08 DIAGNOSIS — I10 Essential (primary) hypertension: Secondary | ICD-10-CM | POA: Diagnosis not present

## 2021-03-08 DIAGNOSIS — N39490 Overflow incontinence: Secondary | ICD-10-CM | POA: Diagnosis not present

## 2021-03-08 DIAGNOSIS — R519 Headache, unspecified: Secondary | ICD-10-CM | POA: Diagnosis not present

## 2021-03-08 DIAGNOSIS — J309 Allergic rhinitis, unspecified: Secondary | ICD-10-CM | POA: Diagnosis not present

## 2021-03-08 DIAGNOSIS — E039 Hypothyroidism, unspecified: Secondary | ICD-10-CM | POA: Diagnosis not present

## 2021-03-09 DIAGNOSIS — H2513 Age-related nuclear cataract, bilateral: Secondary | ICD-10-CM | POA: Diagnosis not present

## 2021-03-09 DIAGNOSIS — Z0100 Encounter for examination of eyes and vision without abnormal findings: Secondary | ICD-10-CM | POA: Diagnosis not present

## 2021-03-11 DIAGNOSIS — L820 Inflamed seborrheic keratosis: Secondary | ICD-10-CM | POA: Diagnosis not present

## 2021-03-11 DIAGNOSIS — L821 Other seborrheic keratosis: Secondary | ICD-10-CM | POA: Diagnosis not present

## 2021-03-11 DIAGNOSIS — D2362 Other benign neoplasm of skin of left upper limb, including shoulder: Secondary | ICD-10-CM | POA: Diagnosis not present

## 2021-03-11 DIAGNOSIS — D692 Other nonthrombocytopenic purpura: Secondary | ICD-10-CM | POA: Diagnosis not present

## 2021-03-11 DIAGNOSIS — L0202 Furuncle of face: Secondary | ICD-10-CM | POA: Diagnosis not present

## 2021-04-29 DIAGNOSIS — M545 Low back pain, unspecified: Secondary | ICD-10-CM | POA: Diagnosis not present

## 2021-04-29 DIAGNOSIS — M25552 Pain in left hip: Secondary | ICD-10-CM | POA: Diagnosis not present

## 2021-04-29 DIAGNOSIS — M1612 Unilateral primary osteoarthritis, left hip: Secondary | ICD-10-CM | POA: Diagnosis not present

## 2021-05-18 DIAGNOSIS — M1612 Unilateral primary osteoarthritis, left hip: Secondary | ICD-10-CM | POA: Diagnosis not present

## 2021-05-20 ENCOUNTER — Other Ambulatory Visit: Payer: Self-pay | Admitting: Orthopedic Surgery

## 2021-05-20 DIAGNOSIS — Z01811 Encounter for preprocedural respiratory examination: Secondary | ICD-10-CM

## 2021-05-21 DIAGNOSIS — E039 Hypothyroidism, unspecified: Secondary | ICD-10-CM | POA: Diagnosis not present

## 2021-05-21 DIAGNOSIS — I10 Essential (primary) hypertension: Secondary | ICD-10-CM | POA: Diagnosis not present

## 2021-05-21 DIAGNOSIS — E782 Mixed hyperlipidemia: Secondary | ICD-10-CM | POA: Diagnosis not present

## 2021-05-24 DIAGNOSIS — M169 Osteoarthritis of hip, unspecified: Secondary | ICD-10-CM | POA: Diagnosis not present

## 2021-05-24 NOTE — Care Plan (Signed)
Ortho Bundle Case Management Note  Patient Details  Name: Rebecca Steele MRN: [id number] Date of Birth: [birth year]  Spoke with patient prior to surgery. She will discharge to home with family. Rolling walker ordered. OPPT set up at [facility]. Patient and MD in agreement with this plan. Choice offered                    DME Arranged:  Walker rolling DME Agency:  [employer]  HH Arranged:    [place] Agency:     Additional Comments: Please contact me with any questions of if this plan should need to change.  Ladell Heads,  [facility]  [phone] 05/24/2021, 4:47 PM

## 2021-05-27 NOTE — Patient Instructions (Addendum)
DUE TO COVID-19 ONLY ONE VISITOR IS ALLOWED TO COME WITH YOU AND STAY IN THE WAITING ROOM ONLY DURING PRE OP AND PROCEDURE DAY OF SURGERY. THE 2 VISITORS  MAY VISIT WITH YOU AFTER SURGERY IN YOUR PRIVATE ROOM DURING VISITING HOURS ONLY!  YOU NEED TO HAVE A COVID 19 TEST ON_8/31______ THIS TEST MUST BE DONE BEFORE SURGERY,                 Rebecca Steele     Your procedure is scheduled on: 06/04/21   Report to [facility] Main  Entrance   Report to admitting at   10:10 AM     Call this number if you have problems the morning of surgery [place], NO [place].   No food after midnight.    You may have clear liquid until 9:30 AM.    At 9:00 AM drink pre surgery drink.   Nothing by mouth after 9:30 AM.    Take these medicines the morning of surgery with A SIP OF WATER: Zoloft, Levothyroxine.  Bring your mask and tubing to the hospital.                                 You may not have any metal on your body including hair pins and              piercings  Do not wear jewelry, make-up, lotions, powders or perfumes, deodorant             Do not wear nail polish on your fingernails and toes.  Do not shave  48 hours prior to surgery.              Do not bring valuables to the hospital. [place].  Contacts, dentures or bridgework may not be worn into surgery.                    Please read over the following fact sheets you were given: _____________________________________________________________________             [facility] - Preparing for Surgery Before surgery, you can play an important role.  Because skin is not sterile, your skin needs to be as free of germs as possible.  You can reduce the number of germs on your skin by washing with CHG (chlorahexidine gluconate) soap before surgery.  CHG is an antiseptic cleaner which kills  germs and bonds with the skin to continue killing germs even after washing. Please DO NOT use if you have an allergy to CHG or antibacterial soaps.  If your skin becomes reddened/irritated stop using the CHG and inform your nurse when you arrive at Short Stay. Do not shave (including legs and underarms) for at least 48 hours prior to the first CHG shower.   Please follow these instructions carefully:  1.  Shower with CHG Soap the night before surgery and the  morning of Surgery.  2.  If you choose to wash your hair, wash your hair first as usual with your  normal  shampoo.  3.  After you shampoo, rinse your hair and body thoroughly to remove the  shampoo.  4.  Use CHG as you would any other liquid soap.  You can apply chg directly  to the skin and wash                       Gently with a scrungie or clean washcloth.  5.  Apply the CHG Soap to your body ONLY FROM THE NECK DOWN.   Do not use on face/ open                           Wound or open sores. Avoid contact with eyes, ears mouth and genitals (private parts).                       Wash face,  Genitals (private parts) with your normal soap.             6.  Wash thoroughly, paying special attention to the area where your surgery  will be performed.  7.  Thoroughly rinse your body with warm water from the neck down.  8.  DO NOT shower/wash with your normal soap after using and rinsing off  the CHG Soap.             9.  Pat yourself dry with a clean towel.            10.  Wear clean pajamas.            11.  Place clean sheets on your bed the night of your first shower and do not  sleep with pets. Day of Surgery : Do not apply any lotions/deodorants the morning of surgery.  Please wear clean clothes to the hospital/surgery center.  FAILURE TO FOLLOW THESE INSTRUCTIONS MAY RESULT IN THE CANCELLATION OF YOUR SURGERY PATIENT SIGNATURE_________________________________  NURSE  SIGNATURE__________________________________  ________________________________________________________________________   Rebecca Steele  An incentive spirometer is a tool that can help keep your lungs clear and active. This tool measures how well you are filling your lungs with each breath. Taking long deep breaths may help reverse or decrease the chance of developing breathing (pulmonary) problems (especially infection) following: A long period of time when you are unable to move or be active. BEFORE THE PROCEDURE  If the spirometer includes an indicator to show your best effort, your nurse or respiratory therapist will set it to a desired goal. If possible, sit up straight or lean slightly forward. Try not to slouch. Hold the incentive spirometer in an upright position. INSTRUCTIONS FOR USE  Sit on the edge of your bed if possible, or sit up as far as you can in bed or on a chair. Hold the incentive spirometer in an upright position. Breathe out normally. Place the mouthpiece in your mouth and seal your lips tightly around it. Breathe in slowly and as deeply as possible, raising the piston or the ball toward the top of the column. Hold your breath for 3-5 seconds or for as long as possible. Allow the piston or ball to fall to the bottom of the column. Remove the mouthpiece from your mouth and breathe out normally. Rest for a few seconds and repeat Steps 1 through 7 at least 10 times every 1-2 hours when you are awake. Take your time and take a few normal breaths between deep breaths. The spirometer may include an indicator to show your best effort. Use the indicator as a goal to work toward during each repetition. After  each set of 10 deep breaths, practice coughing to be sure your lungs are clear. If you have an incision (the cut made at the time of surgery), support your incision when coughing by placing a pillow or rolled up towels firmly against it. Once you are able to get out of  bed, walk around indoors and cough well. You may stop using the incentive spirometer when instructed by your caregiver.  RISKS AND COMPLICATIONS Take your time so you do not get dizzy or light-headed. If you are in pain, you may need to take or ask for pain medication before doing incentive spirometry. It is harder to take a deep breath if you are having pain. AFTER USE Rest and breathe slowly and easily. It can be helpful to keep track of a log of your progress. Your caregiver can provide you with a simple table to help with this. If you are using the spirometer at home, follow these instructions: [place] IF:  You are having difficultly using the spirometer. You have trouble using the spirometer as often as instructed. Your pain medication is not giving enough relief while using the spirometer. You develop fever of 100.5 F (38.1 C) or higher. SEEK IMMEDIATE MEDICAL CARE IF:  You cough up bloody sputum that had not been present before. You develop fever of 102 F (38.9 C) or greater. You develop worsening pain at or near the incision site. MAKE SURE YOU:  Understand these instructions. Will watch your condition. Will get help right away if you are not doing well or get worse. Document Released: 01/30/2007 Document Revised: 12/12/2011 Document Reviewed: 04/02/2007 [employer] Patient Information 2014 [place], [place].   ________________________________________________________________________

## 2021-05-28 ENCOUNTER — Other Ambulatory Visit: Payer: Self-pay

## 2021-05-28 ENCOUNTER — Ambulatory Visit (HOSPITAL_COMMUNITY)
Admission: RE | Admit: 2021-05-28 | Discharge: 2021-05-28 | Disposition: A | Discharge status: Home / Self Care | Payer: Medicare HMO | Source: Ambulatory Visit | Attending: Orthopedic Surgery | Admitting: Orthopedic Surgery

## 2021-05-28 ENCOUNTER — Encounter (HOSPITAL_COMMUNITY)
Admission: RE | Admit: 2021-05-28 | Discharge: 2021-05-28 | Disposition: A | Discharge status: Home / Self Care | Payer: Medicare HMO | Source: Ambulatory Visit | Attending: Orthopedic Surgery | Admitting: Orthopedic Surgery

## 2021-05-28 ENCOUNTER — Encounter (HOSPITAL_COMMUNITY): Payer: Self-pay

## 2021-05-28 DIAGNOSIS — Z01811 Encounter for preprocedural respiratory examination: Secondary | ICD-10-CM

## 2021-05-28 DIAGNOSIS — Z79890 Hormone replacement therapy (postmenopausal): Secondary | ICD-10-CM | POA: Diagnosis not present

## 2021-05-28 DIAGNOSIS — E039 Hypothyroidism, unspecified: Secondary | ICD-10-CM | POA: Insufficient documentation

## 2021-05-28 DIAGNOSIS — Z01818 Encounter for other preprocedural examination: Secondary | ICD-10-CM | POA: Insufficient documentation

## 2021-05-28 DIAGNOSIS — M1612 Unilateral primary osteoarthritis, left hip: Secondary | ICD-10-CM | POA: Insufficient documentation

## 2021-05-28 DIAGNOSIS — Z79899 Other long term (current) drug therapy: Secondary | ICD-10-CM | POA: Diagnosis not present

## 2021-05-28 DIAGNOSIS — G4733 Obstructive sleep apnea (adult) (pediatric): Secondary | ICD-10-CM | POA: Diagnosis not present

## 2021-05-28 DIAGNOSIS — J90 Pleural effusion, not elsewhere classified: Secondary | ICD-10-CM | POA: Diagnosis not present

## 2021-05-28 DIAGNOSIS — Z7982 Long term (current) use of aspirin: Secondary | ICD-10-CM | POA: Diagnosis not present

## 2021-05-28 DIAGNOSIS — Z9189 Other specified personal risk factors, not elsewhere classified: Secondary | ICD-10-CM | POA: Insufficient documentation

## 2021-05-28 DIAGNOSIS — Z7952 Long term (current) use of systemic steroids: Secondary | ICD-10-CM | POA: Diagnosis not present

## 2021-05-28 DIAGNOSIS — I10 Essential (primary) hypertension: Secondary | ICD-10-CM | POA: Insufficient documentation

## 2021-05-28 HISTORY — DX: Personal history of urinary calculi: Z87.442

## 2021-05-28 LAB — CBC WITH DIFFERENTIAL/PLATELET
Abs Immature Granulocytes: 0.03 10*3/uL (ref 0.00–0.07)
Basophils Absolute: 0.1 10*3/uL (ref 0.0–0.1)
Basophils Relative: 2 %
Eosinophils Absolute: 0.2 10*3/uL (ref 0.0–0.5)
Eosinophils Relative: 2 %
HCT: 48.9 % — ABNORMAL HIGH (ref 36.0–46.0)
Hemoglobin: 16.3 g/dL — ABNORMAL HIGH (ref 12.0–15.0)
Immature Granulocytes: 1 %
Lymphocytes Relative: 24 %
Lymphs Abs: 1.6 10*3/uL (ref 0.7–4.0)
MCH: 31.5 pg (ref 26.0–34.0)
MCHC: 33.3 g/dL (ref 30.0–36.0)
MCV: 94.4 fL (ref 80.0–100.0)
Monocytes Absolute: 0.8 10*3/uL (ref 0.1–1.0)
Monocytes Relative: 11 %
Neutro Abs: 4 10*3/uL (ref 1.7–7.7)
Neutrophils Relative %: 60 %
Platelets: 275 10*3/uL (ref 150–400)
RBC: 5.18 MIL/uL — ABNORMAL HIGH (ref 3.87–5.11)
RDW: 13.2 % (ref 11.5–15.5)
WBC: 6.6 10*3/uL (ref 4.0–10.5)
nRBC: 0 % (ref 0.0–0.2)

## 2021-05-28 LAB — URINALYSIS, ROUTINE W REFLEX MICROSCOPIC
Bilirubin Urine: NEGATIVE
Glucose, UA: NEGATIVE mg/dL
Ketones, ur: NEGATIVE mg/dL
Nitrite: NEGATIVE
Protein, ur: NEGATIVE mg/dL
Specific Gravity, Urine: 1.015 (ref 1.005–1.030)
pH: 5 (ref 5.0–8.0)

## 2021-05-28 LAB — PROTIME-INR
INR: 0.9 (ref 0.8–1.2)
Prothrombin Time: 12.3 seconds (ref 11.4–15.2)

## 2021-05-28 LAB — APTT: aPTT: 27 seconds (ref 24–36)

## 2021-05-28 LAB — SURGICAL PCR SCREEN
MRSA, PCR: POSITIVE — AB
Staphylococcus aureus: POSITIVE — AB

## 2021-05-28 NOTE — Progress Notes (Signed)
COVID test Completed:06/02/21   PCP - Dr. Deirdre Evener  LOV 03/08/21 Cardiologist - Dr. Guinevere Scarlet medical  Chest x-ray - no EKG - 05/28/21-chart Stress Test - no ECHO - done but pt doesn't know when Cardiac Cath - no Pacemaker/ICD device last checked:NA  Sleep Study - yes- mild CPAP - no  Fasting Blood Sugar - NA Checks Blood Sugar _____ times a day  Blood Thinner Instructions:NA Aspirin Instructions: Last Dose:  Anesthesia review: yes. Jessica Ward PA had a face to face with the Pt and her husband about complications that the Pt had in 2019 after a breast bx.  Patient denies shortness of breath, fever, cough and chest pain at PAT appointment. Yes No SOB with any activities   Patient verbalized understanding of instructions that were given to them at the PAT appointment. Patient was also instructed that they will need to review over the PAT instructions again at home before surgery. yes

## 2021-05-31 ENCOUNTER — Other Ambulatory Visit: Payer: Self-pay | Admitting: Orthopedic Surgery

## 2021-05-31 NOTE — Anesthesia Preprocedure Evaluation (Addendum)
Anesthesia Evaluation  Patient identified by MRN, date of birth, ID band Patient awake    Reviewed: Allergy & Precautions, NPO status , Patient's Chart, lab work & pertinent test results  History of Anesthesia Complications (+) history of anesthetic complications (episode of altered consciousness after breast biopsy- etiology unknown)  Airway Mallampati: II  TM Distance: >3 FB Neck ROM: Full    Dental  (+) Teeth Intact   Pulmonary sleep apnea ,    Pulmonary exam normal        Cardiovascular hypertension, Pt. on medications Normal cardiovascular exam   Echo 12/13/17: EF 55-60%, g1dd, mild TR, PASP 33   Neuro/Psych Depression negative neurological ROS     GI/Hepatic negative GI ROS, Neg liver ROS,   Endo/Other  Hypothyroidism   Renal/GU negative Renal ROS  negative genitourinary   Musculoskeletal  (+) Arthritis , Osteoarthritis,    Abdominal   Peds  Hematology negative hematology ROS (+)   Anesthesia Other Findings   Reproductive/Obstetrics                           Anesthesia Physical Anesthesia Plan  ASA: 2  Anesthesia Plan: Spinal   Post-op Pain Management:    Induction:   PONV Risk Score and Plan: 2 and Propofol infusion, Treatment may vary due to age or medical condition, Ondansetron and TIVA  Airway Management Planned: Nasal Cannula and Simple Face Mask  Additional Equipment: None  Intra-op Plan:   Post-operative Plan:   Informed Consent: I have reviewed the patients History and Physical, chart, labs and discussed the procedure including the risks, benefits and alternatives for the proposed anesthesia with the patient or authorized representative who has indicated his/her understanding and acceptance.       Plan Discussed with:   Anesthesia Plan Comments: (See PAT note 05/28/2021, Konrad Felix Ward, PA-C)      Anesthesia Quick Evaluation

## 2021-05-31 NOTE — Progress Notes (Signed)
Anesthesia Chart Review   Case: [id number] Date/Time: 06/04/21 1240   Procedure: TOTAL HIP ARTHROPLASTY ANTERIOR APPROACH (Left: Hip)   Anesthesia type: Spinal   Pre-op diagnosis: LEFT HIP OSTEOARTHRITIS   Location: [facility] ROOM 08 / [facility]   Surgeons: Dorna Leitz, MD       DISCUSSION:69 y.o. never smoker with h/o HTN, sleep apnea, hypothyroidism, left hip OA scheduled for above procedure 06/04/21 with Dr. Dorna Leitz.   S/p breast biopsy at the surgery center 12/12/2017. Per previous anesthesia note, "Called to bedside in PACU for patient being unresponsive. Patients vitals all looked perfect. On no supplemental O2 satting 98%. Pt did not respond to moderately uncomfortable stimulus to left clavicle. Patient did not correct her falling arm from hitting her face. She had brisk lid reflex and her pupils ERR bilaterally. She did appear to look right laterally when I opened her eyelid. We watched her closely, but she had minimal improvement in level of consciousness. I discussed transferring her to [facility] for neurology eval and possible head CT for stroke workup. I gave 0.08 mg of narcan to rule out narcotic over sedation. She quickly began to open her eyes and follow commands, though she seemed sedated still. She was moved to phase 2 where she walked, with assistance, to the bathroom. At some point she became somnolent again. Dr. Lyndle Herrlich was covering her recovery at this point. I discussed her course with Dr. Donne Hazel, who was operating at [facility]. We discussed repeat narcan and potential transfer to [facility], which eventually occurred."  During admission workup negative for stroke, normal Echo and carotid US; etiology of postoperative sx unknown.    Please see detailed anesthesia note by Dr. Lissa Hoard 12/14/2017. In summary, "Anesthesia almost certainly did NOT the cause what has occurred, she was not hypotensive, did not laryngospasm, or exhibit signs of seizure activity at [facility], and gabapentin was the only  medication that could have possibly caused significant sedation."  VS: There were no vitals taken for this visit.  PROVIDERS: Fanny Bien, MD is PCP    LABS: Labs reviewed: Acceptable for surgery. (all labs ordered are listed, but only abnormal results are displayed)  Labs Reviewed  SURGICAL PCR SCREEN - Abnormal; Notable for the following components:      Result Value   MRSA, PCR POSITIVE (*)    Staphylococcus aureus POSITIVE (*)    All other components within normal limits  CBC WITH DIFFERENTIAL/PLATELET - Abnormal; Notable for the following components:   RBC 5.18 (*)    Hemoglobin 16.3 (*)    HCT 48.9 (*)    All other components within normal limits  URINALYSIS, ROUTINE W REFLEX MICROSCOPIC - Abnormal; Notable for the following components:   Hgb urine dipstick MODERATE (*)    Leukocytes,Ua MODERATE (*)    Bacteria, UA RARE (*)    All other components within normal limits  PROTIME-INR  APTT  TYPE AND SCREEN     IMAGES:   EKG: 05/28/2021 Rate 72 bpm  NSR Nonspecific ST abnormality   CV: Echo 12/13/2017 Study Conclusions   - Left ventricle: The cavity size was normal. Wall thickness was    normal. Systolic function was normal. The estimated ejection    fraction was in the range of 55% to 60%. Wall motion was normal;    there were no regional wall motion abnormalities. Doppler    parameters are consistent with abnormal left ventricular    relaxation (grade 1 diastolic dysfunction). The E/e&' ratio is  between 8-15, suggesting indeterminate LV filling pressure.  - Mitral valve: Mildly thickened leaflets . There was trivial    regurgitation.  - Left atrium: The atrium was normal in size.  - Tricuspid valve: There was mild regurgitation.  - Pulmonary arteries: PA peak pressure: 33 mm Hg (S).  - Inferior vena cava: The vessel was normal in size. The    respirophasic diameter changes were in the normal range (>= 50%),    consistent with normal central  venous pressure.  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    Past Surgical History:  Procedure Laterality Date   APPENDECTOMY     CHOLECYSTECTOMY     CYSTOSCOPY W/ RETROGRADES     NECK SURGERY     RADIOACTIVE SEED GUIDED EXCISIONAL BREAST BIOPSY Right 12/12/2017   Procedure: RIGHT RADIOACTIVE SEED GUIDED EXCISIONAL BREAST BIOPSY ERAS PATHWAY;  Surgeon: Rolm Bookbinder, MD;  Location: [place];  Service: General;  Laterality: Right;  LMA   SHOULDER SURGERY     TONSILLECTOMY     TRIGGER FINGER RELEASE Right 05/18/2015   Procedure: RIGHT  LONG FINGER TRIGGER RELEASE ;  Surgeon: Milly Jakob, MD;  Location: [place];  Service: Orthopedics;  Laterality: Right;    MEDICATIONS:  acetaminophen (TYLENOL) 500 MG tablet   aspirin EC 81 MG EC tablet   butalbital-acetaminophen-caffeine (FIORICET) 50-325-40 MG tablet   Cholecalciferol (VITAMIN D3) 1000 units CAPS   ibuprofen (ADVIL,MOTRIN) 200 MG tablet   levothyroxine (SYNTHROID) 75 MCG tablet   loratadine (CLARITIN) 10 MG tablet   montelukast (SINGULAIR) 10 MG tablet   predniSONE (STERAPRED UNI-PAK 21 TAB) 10 MG (21) TBPK tablet   sertraline (ZOLOFT) 100 MG tablet   traZODone (DESYREL) 100 MG tablet   valsartan-hydrochlorothiazide (DIOVAN-HCT) 320-25 MG tablet   No current facility-administered medications for this encounter.     Konrad Felix Ward, PA-C [facility]Surgical Testing [phone]

## 2021-06-02 ENCOUNTER — Other Ambulatory Visit: Payer: Self-pay | Admitting: Orthopedic Surgery

## 2021-06-03 LAB — SARS CORONAVIRUS 2 (TAT 6-24 HRS): SARS Coronavirus 2: NEGATIVE

## 2021-06-04 ENCOUNTER — Ambulatory Visit (HOSPITAL_COMMUNITY): Payer: Medicare HMO

## 2021-06-04 ENCOUNTER — Encounter (HOSPITAL_COMMUNITY): Payer: Self-pay | Admitting: Orthopedic Surgery

## 2021-06-04 ENCOUNTER — Ambulatory Visit (HOSPITAL_COMMUNITY): Payer: Medicare HMO | Admitting: Physician Assistant

## 2021-06-04 ENCOUNTER — Encounter (HOSPITAL_COMMUNITY)
Admission: RE | Disposition: A | Discharge status: Home / Self Care | Payer: Self-pay | Source: Ambulatory Visit | Attending: Orthopedic Surgery

## 2021-06-04 ENCOUNTER — Observation Stay (HOSPITAL_COMMUNITY)
Admission: RE | Admit: 2021-06-04 | Discharge: 2021-06-06 | Disposition: A | Discharge status: Home / Self Care | Payer: Medicare HMO | Source: Ambulatory Visit | Attending: Orthopedic Surgery | Admitting: Orthopedic Surgery

## 2021-06-04 ENCOUNTER — Other Ambulatory Visit: Payer: Self-pay

## 2021-06-04 DIAGNOSIS — E039 Hypothyroidism, unspecified: Secondary | ICD-10-CM | POA: Diagnosis not present

## 2021-06-04 DIAGNOSIS — I10 Essential (primary) hypertension: Secondary | ICD-10-CM | POA: Insufficient documentation

## 2021-06-04 DIAGNOSIS — Z96641 Presence of right artificial hip joint: Secondary | ICD-10-CM | POA: Diagnosis not present

## 2021-06-04 DIAGNOSIS — R69 Illness, unspecified: Secondary | ICD-10-CM | POA: Diagnosis not present

## 2021-06-04 DIAGNOSIS — Z471 Aftercare following joint replacement surgery: Secondary | ICD-10-CM | POA: Diagnosis not present

## 2021-06-04 DIAGNOSIS — Z419 Encounter for procedure for purposes other than remedying health state, unspecified: Secondary | ICD-10-CM

## 2021-06-04 DIAGNOSIS — M1612 Unilateral primary osteoarthritis, left hip: Secondary | ICD-10-CM | POA: Diagnosis not present

## 2021-06-04 DIAGNOSIS — Z96642 Presence of left artificial hip joint: Secondary | ICD-10-CM | POA: Diagnosis not present

## 2021-06-04 HISTORY — PX: TOTAL HIP ARTHROPLASTY: SHX124

## 2021-06-04 LAB — TYPE AND SCREEN
ABO/RH(D): O POS
Antibody Screen: NEGATIVE

## 2021-06-04 LAB — ABO/RH: ABO/RH(D): O POS

## 2021-06-04 SURGERY — ARTHROPLASTY, HIP, TOTAL, ANTERIOR APPROACH
Anesthesia: Spinal | Site: Hip | Laterality: Left

## 2021-06-04 MED ORDER — PHENYLEPHRINE HCL-NACL 20-0.9 MG/250ML-% IV SOLN
INTRAVENOUS | Status: DC | PRN
  Administered 2021-06-04: 50 ug/min via INTRAVENOUS

## 2021-06-04 MED ORDER — POVIDONE-IODINE 10 % EX SWAB
2.0000 "application " | Freq: Once | CUTANEOUS | Status: AC
  Administered 2021-06-04: 2 via TOPICAL

## 2021-06-04 MED ORDER — PHENOL 1.4 % MT LIQD: 1.0000 | OROMUCOSAL | Status: DC | PRN

## 2021-06-04 MED ORDER — BUPIVACAINE-EPINEPHRINE (PF) 0.25% -1:200000 IJ SOLN
INTRAMUSCULAR | Status: AC
  Filled 2021-06-04: qty 30

## 2021-06-04 MED ORDER — MIDAZOLAM HCL 2 MG/2ML IJ SOLN
INTRAMUSCULAR | Status: AC
  Filled 2021-06-04: qty 4

## 2021-06-04 MED ORDER — AMISULPRIDE (ANTIEMETIC) 5 MG/2ML IV SOLN: 10.0000 mg | Freq: Once | INTRAVENOUS | Status: DC | PRN

## 2021-06-04 MED ORDER — BISACODYL 5 MG PO TBEC: 5.0000 mg | DELAYED_RELEASE_TABLET | Freq: Every day | ORAL | Status: DC | PRN

## 2021-06-04 MED ORDER — METHOCARBAMOL 500 MG IVPB - SIMPLE MED
INTRAVENOUS | Status: AC
  Administered 2021-06-04: 500 mg
  Filled 2021-06-04: qty 50

## 2021-06-04 MED ORDER — VANCOMYCIN HCL IN DEXTROSE 1-5 GM/200ML-% IV SOLN
1000.0000 mg | Freq: Two times a day (BID) | INTRAVENOUS | Status: AC
  Administered 2021-06-04: 1000 mg via INTRAVENOUS
  Filled 2021-06-04: qty 200

## 2021-06-04 MED ORDER — ASPIRIN EC 325 MG PO TBEC: 325.0000 mg | DELAYED_RELEASE_TABLET | Freq: Two times a day (BID) | ORAL | 0 refills | Status: DC

## 2021-06-04 MED ORDER — ALBUMIN HUMAN 5 % IV SOLN: INTRAVENOUS | Status: DC | PRN

## 2021-06-04 MED ORDER — ACETAMINOPHEN 325 MG PO TABS
325.0000 mg | ORAL_TABLET | Freq: Four times a day (QID) | ORAL | Status: DC | PRN
  Administered 2021-06-05 – 2021-06-06 (×3): 650 mg via ORAL
  Filled 2021-06-04 (×3): qty 2

## 2021-06-04 MED ORDER — DEXAMETHASONE SODIUM PHOSPHATE 10 MG/ML IJ SOLN
INTRAMUSCULAR | Status: DC | PRN
  Administered 2021-06-04: 10 mg via INTRAVENOUS

## 2021-06-04 MED ORDER — VALSARTAN-HYDROCHLOROTHIAZIDE 320-25 MG PO TABS: 1.0000 | ORAL_TABLET | Freq: Every day | ORAL | Status: DC

## 2021-06-04 MED ORDER — PHENYLEPHRINE 40 MCG/ML (10ML) SYRINGE FOR IV PUSH (FOR BLOOD PRESSURE SUPPORT)
PREFILLED_SYRINGE | INTRAVENOUS | Status: AC
  Filled 2021-06-04: qty 10

## 2021-06-04 MED ORDER — HYDROCHLOROTHIAZIDE 25 MG PO TABS: 25.0000 mg | ORAL_TABLET | Freq: Every day | ORAL | Status: DC

## 2021-06-04 MED ORDER — TRAZODONE HCL 100 MG PO TABS
100.0000 mg | ORAL_TABLET | Freq: Every day | ORAL | Status: DC
  Administered 2021-06-04 – 2021-06-05 (×2): 100 mg via ORAL
  Filled 2021-06-04 (×2): qty 1

## 2021-06-04 MED ORDER — ONDANSETRON HCL 4 MG/2ML IJ SOLN
INTRAMUSCULAR | Status: DC | PRN
  Administered 2021-06-04: 4 mg via INTRAVENOUS

## 2021-06-04 MED ORDER — BUPIVACAINE LIPOSOME 1.3 % IJ SUSP
INTRAMUSCULAR | Status: DC | PRN
  Administered 2021-06-04: 10 mL

## 2021-06-04 MED ORDER — BUPIVACAINE-EPINEPHRINE 0.25% -1:200000 IJ SOLN
INTRAMUSCULAR | Status: DC | PRN
  Administered 2021-06-04: 30 mL

## 2021-06-04 MED ORDER — TRANEXAMIC ACID-NACL 1000-0.7 MG/100ML-% IV SOLN
1000.0000 mg | Freq: Once | INTRAVENOUS | Status: AC
  Administered 2021-06-04: 1000 mg via INTRAVENOUS
  Filled 2021-06-04: qty 100

## 2021-06-04 MED ORDER — METHOCARBAMOL 500 MG IVPB - SIMPLE MED
500.0000 mg | Freq: Four times a day (QID) | INTRAVENOUS | Status: DC | PRN
  Filled 2021-06-04: qty 50

## 2021-06-04 MED ORDER — SODIUM CHLORIDE 0.9 % IV SOLN: INTRAVENOUS | Status: DC | PRN

## 2021-06-04 MED ORDER — ASPIRIN EC 325 MG PO TBEC
325.0000 mg | DELAYED_RELEASE_TABLET | Freq: Two times a day (BID) | ORAL | Status: DC
  Administered 2021-06-05 – 2021-06-06 (×3): 325 mg via ORAL
  Filled 2021-06-04 (×4): qty 1

## 2021-06-04 MED ORDER — METHOCARBAMOL 500 MG PO TABS
500.0000 mg | ORAL_TABLET | Freq: Four times a day (QID) | ORAL | Status: DC | PRN
  Administered 2021-06-05 (×2): 500 mg via ORAL
  Filled 2021-06-04 (×2): qty 1

## 2021-06-04 MED ORDER — DEXAMETHASONE SODIUM PHOSPHATE 10 MG/ML IJ SOLN
INTRAMUSCULAR | Status: AC
  Filled 2021-06-04: qty 1

## 2021-06-04 MED ORDER — BUPIVACAINE LIPOSOME 1.3 % IJ SUSP: 10.0000 mL | Freq: Once | INTRAMUSCULAR | Status: DC

## 2021-06-04 MED ORDER — DEXAMETHASONE SODIUM PHOSPHATE 10 MG/ML IJ SOLN
10.0000 mg | Freq: Two times a day (BID) | INTRAMUSCULAR | Status: AC
  Administered 2021-06-04 – 2021-06-05 (×2): 10 mg via INTRAVENOUS
  Filled 2021-06-04 (×2): qty 1

## 2021-06-04 MED ORDER — OXYCODONE HCL 5 MG PO TABS
5.0000 mg | ORAL_TABLET | ORAL | Status: DC | PRN
  Administered 2021-06-05: 10 mg via ORAL
  Administered 2021-06-05: 5 mg via ORAL
  Administered 2021-06-05 – 2021-06-06 (×3): 10 mg via ORAL
  Filled 2021-06-04 (×4): qty 2
  Filled 2021-06-04: qty 1

## 2021-06-04 MED ORDER — KETOROLAC TROMETHAMINE 15 MG/ML IJ SOLN
INTRAMUSCULAR | Status: AC
  Filled 2021-06-04: qty 1

## 2021-06-04 MED ORDER — OXYCODONE HCL 5 MG PO TABS
ORAL_TABLET | ORAL | Status: AC
  Filled 2021-06-04: qty 1

## 2021-06-04 MED ORDER — FENTANYL CITRATE PF 50 MCG/ML IJ SOSY
PREFILLED_SYRINGE | INTRAMUSCULAR | Status: AC
  Filled 2021-06-04: qty 3

## 2021-06-04 MED ORDER — PROPOFOL 10 MG/ML IV BOLUS
INTRAVENOUS | Status: DC | PRN
  Administered 2021-06-04: 30 mg via INTRAVENOUS
  Administered 2021-06-04 (×2): 40 mg via INTRAVENOUS

## 2021-06-04 MED ORDER — BUPIVACAINE LIPOSOME 1.3 % IJ SUSP
INTRAMUSCULAR | Status: AC
  Filled 2021-06-04: qty 10

## 2021-06-04 MED ORDER — LEVOTHYROXINE SODIUM 75 MCG PO TABS
75.0000 ug | ORAL_TABLET | Freq: Every day | ORAL | Status: DC
  Administered 2021-06-05 – 2021-06-06 (×2): 75 ug via ORAL
  Filled 2021-06-04 (×2): qty 1

## 2021-06-04 MED ORDER — MENTHOL 3 MG MT LOZG: 1.0000 | LOZENGE | OROMUCOSAL | Status: DC | PRN

## 2021-06-04 MED ORDER — KETOROLAC TROMETHAMINE 15 MG/ML IJ SOLN
15.0000 mg | Freq: Once | INTRAMUSCULAR | Status: AC
  Administered 2021-06-04: 15 mg via INTRAVENOUS

## 2021-06-04 MED ORDER — FENTANYL CITRATE PF 50 MCG/ML IJ SOSY: 25.0000 ug | PREFILLED_SYRINGE | INTRAMUSCULAR | Status: DC | PRN

## 2021-06-04 MED ORDER — CHLORHEXIDINE GLUCONATE 0.12 % MT SOLN: 15.0000 mL | Freq: Once | OROMUCOSAL | Status: AC

## 2021-06-04 MED ORDER — VANCOMYCIN HCL IN DEXTROSE 1-5 GM/200ML-% IV SOLN
1000.0000 mg | Freq: Once | INTRAVENOUS | Status: DC
Start: 2021-06-04 — End: 2021-06-04

## 2021-06-04 MED ORDER — ONDANSETRON HCL 4 MG/2ML IJ SOLN: 4.0000 mg | Freq: Four times a day (QID) | INTRAMUSCULAR | Status: DC | PRN

## 2021-06-04 MED ORDER — OXYCODONE-ACETAMINOPHEN 5-325 MG PO TABS: 1.0000 | ORAL_TABLET | Freq: Four times a day (QID) | ORAL | 0 refills | Status: DC | PRN

## 2021-06-04 MED ORDER — ONDANSETRON HCL 4 MG/2ML IJ SOLN
INTRAMUSCULAR | Status: AC
  Filled 2021-06-04: qty 2

## 2021-06-04 MED ORDER — SERTRALINE HCL 100 MG PO TABS
100.0000 mg | ORAL_TABLET | Freq: Every day | ORAL | Status: DC
  Administered 2021-06-05 – 2021-06-06 (×2): 100 mg via ORAL
  Filled 2021-06-04 (×2): qty 1

## 2021-06-04 MED ORDER — HYDROMORPHONE HCL 1 MG/ML IJ SOLN
0.5000 mg | INTRAMUSCULAR | Status: DC | PRN
  Administered 2021-06-05: 1 mg via INTRAVENOUS
  Filled 2021-06-04: qty 1

## 2021-06-04 MED ORDER — TRANEXAMIC ACID-NACL 1000-0.7 MG/100ML-% IV SOLN
1000.0000 mg | INTRAVENOUS | Status: AC
  Administered 2021-06-04: 1000 mg via INTRAVENOUS
  Filled 2021-06-04: qty 100

## 2021-06-04 MED ORDER — TIZANIDINE HCL 2 MG PO TABS: 2.0000 mg | ORAL_TABLET | Freq: Three times a day (TID) | ORAL | 0 refills | Status: DC | PRN

## 2021-06-04 MED ORDER — SODIUM CHLORIDE 0.9 % IV SOLN: INTRAVENOUS | Status: DC

## 2021-06-04 MED ORDER — ALBUMIN HUMAN 5 % IV SOLN
INTRAVENOUS | Status: AC
  Filled 2021-06-04: qty 250

## 2021-06-04 MED ORDER — ONDANSETRON HCL 4 MG PO TABS: 4.0000 mg | ORAL_TABLET | Freq: Four times a day (QID) | ORAL | Status: DC | PRN

## 2021-06-04 MED ORDER — VANCOMYCIN HCL IN DEXTROSE 1-5 GM/200ML-% IV SOLN
1000.0000 mg | INTRAVENOUS | Status: AC
  Administered 2021-06-04: 1000 mg via INTRAVENOUS
  Filled 2021-06-04: qty 200

## 2021-06-04 MED ORDER — DOCUSATE SODIUM 100 MG PO CAPS
100.0000 mg | ORAL_CAPSULE | Freq: Two times a day (BID) | ORAL | Status: DC
  Administered 2021-06-04 – 2021-06-06 (×4): 100 mg via ORAL
  Filled 2021-06-04 (×4): qty 1

## 2021-06-04 MED ORDER — LIDOCAINE 2% (20 MG/ML) 5 ML SYRINGE
INTRAMUSCULAR | Status: DC | PRN
  Administered 2021-06-04: 100 mg via INTRAVENOUS

## 2021-06-04 MED ORDER — OXYCODONE HCL 5 MG/5ML PO SOLN: 5.0000 mg | Freq: Once | ORAL | Status: AC | PRN

## 2021-06-04 MED ORDER — OXYCODONE HCL 5 MG PO TABS
5.0000 mg | ORAL_TABLET | Freq: Once | ORAL | Status: AC | PRN
  Administered 2021-06-04: 5 mg via ORAL

## 2021-06-04 MED ORDER — MONTELUKAST SODIUM 10 MG PO TABS
10.0000 mg | ORAL_TABLET | Freq: Every day | ORAL | Status: DC
  Administered 2021-06-04 – 2021-06-05 (×2): 10 mg via ORAL
  Filled 2021-06-04 (×2): qty 1

## 2021-06-04 MED ORDER — ALUM & MAG HYDROXIDE-SIMETH 200-200-20 MG/5ML PO SUSP: 30.0000 mL | ORAL | Status: DC | PRN

## 2021-06-04 MED ORDER — PHENYLEPHRINE 40 MCG/ML (10ML) SYRINGE FOR IV PUSH (FOR BLOOD PRESSURE SUPPORT)
PREFILLED_SYRINGE | INTRAVENOUS | Status: DC | PRN
  Administered 2021-06-04 (×2): 120 ug via INTRAVENOUS

## 2021-06-04 MED ORDER — POLYETHYLENE GLYCOL 3350 17 G PO PACK: 17.0000 g | PACK | Freq: Every day | ORAL | Status: DC | PRN

## 2021-06-04 MED ORDER — LACTATED RINGERS IV SOLN: INTRAVENOUS | Status: DC

## 2021-06-04 MED ORDER — ORAL CARE MOUTH RINSE
15.0000 mL | Freq: Once | OROMUCOSAL | Status: AC
  Administered 2021-06-04: 15 mL via OROMUCOSAL

## 2021-06-04 MED ORDER — PROPOFOL 10 MG/ML IV BOLUS
INTRAVENOUS | Status: AC
  Filled 2021-06-04: qty 20

## 2021-06-04 MED ORDER — ACETAMINOPHEN 10 MG/ML IV SOLN
1000.0000 mg | Freq: Once | INTRAVENOUS | Status: AC
  Administered 2021-06-04: 1000 mg via INTRAVENOUS

## 2021-06-04 MED ORDER — PROPOFOL 500 MG/50ML IV EMUL
INTRAVENOUS | Status: DC | PRN
  Administered 2021-06-04: 100 ug/kg/min via INTRAVENOUS

## 2021-06-04 MED ORDER — DIPHENHYDRAMINE HCL 12.5 MG/5ML PO ELIX
12.5000 mg | ORAL_SOLUTION | ORAL | Status: DC | PRN
  Administered 2021-06-05 – 2021-06-06 (×2): 25 mg via ORAL
  Filled 2021-06-04 (×2): qty 10

## 2021-06-04 MED ORDER — FENTANYL CITRATE (PF) 100 MCG/2ML IJ SOLN
INTRAMUSCULAR | Status: DC | PRN
  Administered 2021-06-04: 100 ug via INTRAVENOUS

## 2021-06-04 MED ORDER — FENTANYL CITRATE (PF) 100 MCG/2ML IJ SOLN
INTRAMUSCULAR | Status: AC
  Filled 2021-06-04: qty 2

## 2021-06-04 MED ORDER — SODIUM CHLORIDE 0.9 % IR SOLN
Status: DC | PRN
  Administered 2021-06-04: 1000 mL

## 2021-06-04 MED ORDER — ACETAMINOPHEN 10 MG/ML IV SOLN
INTRAVENOUS | Status: AC
  Filled 2021-06-04: qty 100

## 2021-06-04 MED ORDER — IRBESARTAN 150 MG PO TABS: 300.0000 mg | ORAL_TABLET | Freq: Every day | ORAL | Status: DC

## 2021-06-04 MED ORDER — WATER FOR IRRIGATION, STERILE IR SOLN
Status: DC | PRN
  Administered 2021-06-04: 2000 mL

## 2021-06-04 MED ORDER — ONDANSETRON HCL 4 MG/2ML IJ SOLN: 4.0000 mg | Freq: Once | INTRAMUSCULAR | Status: DC | PRN

## 2021-06-04 SURGICAL SUPPLY — 45 items
APL SKNCLS STERI-STRIP NONHPOA (GAUZE/BANDAGES/DRESSINGS)
BAG COUNTER SPONGE SURGICOUNT (BAG) IMPLANT
BAG SPEC THK2 15X12 ZIP CLS (MISCELLANEOUS)
BAG SPNG CNTER NS LX DISP (BAG)
BAG ZIPLOCK 12X15 (MISCELLANEOUS) IMPLANT
BENZOIN TINCTURE PRP APPL 2/3 (GAUZE/BANDAGES/DRESSINGS) IMPLANT
BLADE SAW SGTL 18X1.27X75 (BLADE) ×2 IMPLANT
BLADE SURG SZ10 CARB STEEL (BLADE) ×4 IMPLANT
COVER PERINEAL POST (MISCELLANEOUS) ×2 IMPLANT
COVER SURGICAL LIGHT HANDLE (MISCELLANEOUS) ×2 IMPLANT
DECANTER SPIKE VIAL GLASS SM (MISCELLANEOUS) ×2 IMPLANT
DRAPE FOOT SWITCH (DRAPES) ×2 IMPLANT
DRAPE STERI IOBAN 125X83 (DRAPES) ×2 IMPLANT
DRAPE U-SHAPE 47X51 STRL (DRAPES) ×4 IMPLANT
DRSG AQUACEL AG ADV 3.5X 6 (GAUZE/BANDAGES/DRESSINGS) ×2 IMPLANT
DURAPREP 26ML APPLICATOR (WOUND CARE) ×2 IMPLANT
ELECT BLADE TIP CTD 4 INCH (ELECTRODE) ×2 IMPLANT
ELECT REM PT RETURN 15FT ADLT (MISCELLANEOUS) ×2 IMPLANT
ELIMINATOR HOLE APEX DEPUY (Hips) ×1 IMPLANT
GAUZE XEROFORM 1X8 LF (GAUZE/BANDAGES/DRESSINGS) IMPLANT
GLOVE SRG 8 PF TXTR STRL LF DI (GLOVE) ×2 IMPLANT
GLOVE SURG NEOP MICRO LF SZ7.5 (GLOVE) ×4 IMPLANT
GLOVE SURG UNDER POLY LF SZ8 (GLOVE) ×4
GOWN STRL REUS W/TWL XL LVL3 (GOWN DISPOSABLE) ×4 IMPLANT
HEAD CERAMIC DELTA 28 P1.5 HIP (Head) ×1 IMPLANT
HOLDER FOLEY CATH W/STRAP (MISCELLANEOUS) ×2 IMPLANT
HOOD PEEL AWAY FLYTE STAYCOOL (MISCELLANEOUS) ×4 IMPLANT
KIT TURNOVER KIT A (KITS) ×2 IMPLANT
LINER NEUT PINNACLE 28X46 (Miscellaneous) ×2 IMPLANT
LINER NEUTRAL PINNACLE 28X46 (Miscellaneous) IMPLANT
NEEDLE HYPO 22GX1.5 SAFETY (NEEDLE) ×2 IMPLANT
PACK ANTERIOR HIP CUSTOM (KITS) ×2 IMPLANT
PENCIL SMOKE EVACUATOR (MISCELLANEOUS) IMPLANT
SHELL GRIPTION 100 48MM (Shell) ×1 IMPLANT
STAPLER VISISTAT 35W (STAPLE) IMPLANT
STEM CORAIL KA12 (Stem) ×1 IMPLANT
STRIP CLOSURE SKIN 1/2X4 (GAUZE/BANDAGES/DRESSINGS) IMPLANT
SUT ETHIBOND NAB CT1 #1 30IN (SUTURE) ×4 IMPLANT
SUT MNCRL AB 3-0 PS2 18 (SUTURE) IMPLANT
SUT VIC AB 0 CT1 36 (SUTURE) ×2 IMPLANT
SUT VIC AB 1 CT1 36 (SUTURE) ×2 IMPLANT
SUT VIC AB 2-0 CT1 27 (SUTURE) ×2
SUT VIC AB 2-0 CT1 TAPERPNT 27 (SUTURE) ×1 IMPLANT
TAPE STRIPS DRAPE STRL (GAUZE/BANDAGES/DRESSINGS) ×1 IMPLANT
TRAY FOLEY MTR SLVR 16FR STAT (SET/KITS/TRAYS/PACK) ×2 IMPLANT

## 2021-06-04 NOTE — Transfer of Care (Signed)
Immediate Anesthesia Transfer of Care Note  Patient: Rebecca Steele  Procedure(s) Performed: TOTAL HIP ARTHROPLASTY ANTERIOR APPROACH (Left: Hip)  Patient Location: PACU  Anesthesia Type:Spinal  Level of Consciousness: awake, sedated and patient cooperative  Airway & Oxygen Therapy: Patient Spontanous Breathing and Patient connected to face mask oxygen  Post-op Assessment: Report given to RN and Post -op Vital signs reviewed and stable  Post vital signs: stable  Last Vitals:  Vitals Value Taken Time  BP 104/55 06/04/21 1515  Temp 36.3 C 06/04/21 1515  Pulse 87 06/04/21 1520  Resp 17 06/04/21 1520  SpO2 100 % 06/04/21 1520  Vitals shown include unvalidated device data.  Last Pain:  Vitals:   06/04/21 1057  TempSrc: Oral  PainSc:          Complications: No notable events documented.

## 2021-06-04 NOTE — Anesthesia Procedure Notes (Signed)
Spinal  Patient location during procedure: OR Reason for block: surgical anesthesia Staffing Performed: anesthesiologist  Anesthesiologist: Asta Corbridge E, MD Preanesthetic Checklist Completed: patient identified, IV checked, risks and benefits discussed, surgical consent, monitors and equipment checked, pre-op evaluation and timeout performed Spinal Block Patient position: sitting Prep: DuraPrep and site prepped and draped Patient monitoring: continuous pulse ox, blood pressure and heart rate Approach: midline Location: L3-4 Injection technique: single-shot Needle Needle type: Pencan  Needle gauge: 24 G Needle length: 9 cm Assessment Events: CSF return and second provider Additional Notes Functioning IV was confirmed and monitors were applied. Sterile prep and drape, including hand hygiene and sterile gloves were used. The patient was positioned and the spine was prepped. The skin was anesthetized with lidocaine.  Free flow of clear CSF was obtained prior to injecting local anesthetic into the CSF. The needle was carefully withdrawn. The patient tolerated the procedure well.     

## 2021-06-04 NOTE — Plan of Care (Signed)
  Problem: Activity: Goal: Ability to avoid complications of mobility impairment will improve Outcome: Progressing   Problem: Clinical Measurements: Goal: Postoperative complications will be avoided or minimized Outcome: Progressing   Problem: Pain Management: Goal: Pain level will decrease with appropriate interventions Outcome: Progressing   Problem: Skin Integrity: Goal: Will show signs of wound healing Outcome: Progressing   Problem: Education: Goal: Knowledge of General Education information will improve Description: Including pain rating scale, medication(s)/side effects and non-pharmacologic comfort measures Outcome: Progressing   Problem: Health Behavior/Discharge Planning: Goal: Ability to manage health-related needs will improve Outcome: Progressing   

## 2021-06-04 NOTE — Discharge Instructions (Signed)

## 2021-06-04 NOTE — Anesthesia Postprocedure Evaluation (Signed)
Anesthesia Post Note  Patient: Rebecca Steele  Procedure(s) Performed: TOTAL HIP ARTHROPLASTY ANTERIOR APPROACH (Left: Hip)     Patient location during evaluation: PACU Anesthesia Type: Spinal Level of consciousness: oriented and awake and alert Pain management: pain level controlled Vital Signs Assessment: post-procedure vital signs reviewed and stable Respiratory status: spontaneous breathing, respiratory function stable and nonlabored ventilation Cardiovascular status: blood pressure returned to baseline and stable Postop Assessment: no headache, no backache, no apparent nausea or vomiting and spinal receding Anesthetic complications: no   No notable events documented.  Last Vitals:  Vitals:   06/04/21 1615 06/04/21 1630  BP: 106/60 (!) 108/58  Pulse: 78 83  Resp: 12 14  Temp: 36.4 C   SpO2: 98% 99%    Last Pain:  Vitals:   06/04/21 1615  TempSrc:   PainSc: 0-No pain                 Lidia Collum

## 2021-06-04 NOTE — H&P (Signed)
TOTAL HIP ADMISSION H&P  Patient is admitted for left total hip arthroplasty.  Subjective:  Chief Complaint: left hip pain  HPI: Rebecca Steele, 70 y.o. female, has a history of pain and functional disability in the left hip(s) due to arthritis and patient has failed non-surgical conservative treatments for greater than 12 weeks to include NSAID's and/or analgesics, weight reduction as appropriate, and activity modification.  Onset of symptoms was gradual starting 3 years ago with rapidlly worsening course since that time.The patient noted no past surgery on the left hip(s).  Patient currently rates pain in the left hip at 9 out of 10 with activity. Patient has night pain, worsening of pain with activity and weight bearing, trendelenberg gait, pain that interfers with activities of daily living, and pain with passive range of motion. Patient has evidence of subchondral sclerosis, periarticular osteophytes, and joint space narrowing by imaging studies. This condition presents safety issues increasing the risk of falls. This patient has had  failure of all reasonable conservative care .  There is no current active infection.  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   Obtundation 12/12/2017   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    Past Surgical History:  Procedure Laterality Date   APPENDECTOMY     CHOLECYSTECTOMY     CYSTOSCOPY W/ RETROGRADES     NECK SURGERY     RADIOACTIVE SEED GUIDED EXCISIONAL BREAST BIOPSY Right 12/12/2017   Procedure: RIGHT RADIOACTIVE SEED GUIDED EXCISIONAL BREAST BIOPSY ERAS PATHWAY;   Surgeon: Rolm Bookbinder, MD;  Location: [place];  Service: General;  Laterality: Right;  LMA   SHOULDER SURGERY     TONSILLECTOMY     TRIGGER FINGER RELEASE Right 05/18/2015   Procedure: RIGHT  LONG FINGER TRIGGER RELEASE ;  Surgeon: Milly Jakob, MD;  Location: [place];  Service: Orthopedics;  Laterality: Right;    Current Facility-Administered Medications  Medication Dose Route Frequency Provider Last Rate Last Admin   bupivacaine liposome (EXPAREL) 1.3 % injection 133 mg  10 mL Other Once Dorna Leitz, MD       lactated ringers infusion   Intravenous Continuous Annye Asa, MD 10 mL/hr at 06/04/21 1155 Continued from Pre-op at 06/04/21 1155   tranexamic acid (CYKLOKAPRON) IVPB 1,000 mg  1,000 mg Intravenous To OR Dorna Leitz, MD       vancomycin (VANCOCIN) IVPB 1000 mg/200 mL premix  1,000 mg Intravenous On Call to OR Dorna Leitz, MD       vancomycin (VANCOCIN) IVPB 1000 mg/200 mL premix  1,000 mg Intravenous Once Dorna Leitz, MD       Allergies  Allergen Reactions   Gadolinium Derivatives Anaphylaxis and Shortness Of Breath    Pt was given 60m multihance for MRI. After about 183m30 seconds she squeezed the ball saying she felt like her throat was closing up. Exam was DC'd and radiologist gave '75mg'$  benadryl IV.    Other     Pt states she  was given Gabapentin and Fentanyl for anesthesia during recent surgery and was unable to wake up for several hours.  Pt states she does not want to have sedation with those medications in the future.   Valium Anaphylaxis   Doxycycline Swelling    Facial swelling   Iohexol Itching     Desc: Pt. stated she had iv contrast around 25 yrs ago and had itching on her neck.  she took benadryl and it went away.  The rad recommended premeds and be done tomorrow but the pt.did not want to do that so we did her w/o iv contrast today., Onset Date: [id number]    Macrodantin Nausea And Vomiting   Red Dye Swelling     Facial swelling (cannot take pink or red tablets or capsules)   Rocephin [Ceftriaxone] Other (See Comments)    Joint aches   Zithromax [Azithromycin] Other (See Comments)    Weakness, cold/clammy, legs trembling    Social History   Tobacco Use   Smoking status: Never   Smokeless tobacco: Never  Substance Use Topics   Alcohol use: No    History reviewed. No pertinent family history.   Review of Systems ROS: I have reviewed the patient's review of systems thoroughly and there are no positive responses as relates to the HPI.  Objective:  Physical Exam  Vital signs in last 24 hours: Temp:  [97.8 F (36.6 C)] 97.8 F (36.6 C) (09/02 1057) Pulse Rate:  [84] 84 (09/02 1057) Resp:  [18] 18 (09/02 1057) BP: (127)/(74) 127/74 (09/02 1057) SpO2:  [97 %] 97 % (09/02 1057) Weight:  [87.5 kg] 87.5 kg (09/02 1043) Well-developed well-nourished patient in no acute distress. Alert and oriented x3 HEENT:within normal limits Cardiac: Regular rate and rhythm Pulmonary: Lungs clear to auscultation Abdomen: Soft and nontender.  Normal active bowel sounds  Musculoskeletal: (l hip: limited rom painful int rot nvi distally skin in good repair Labs: Recent Results (from the past 2160 hour(s))  Urinalysis, Routine w reflex microscopic Urine, Clean Catch     Status: Abnormal   Collection Time: 05/28/21 10:05 AM  Result Value Ref Range   Color, Urine YELLOW YELLOW   APPearance CLEAR CLEAR   Specific Gravity, Urine 1.015 1.005 - 1.030   pH 5.0 5.0 - 8.0   Glucose, UA NEGATIVE NEGATIVE mg/dL   Hgb urine dipstick MODERATE (A) NEGATIVE   Bilirubin Urine NEGATIVE NEGATIVE   Ketones, ur NEGATIVE NEGATIVE mg/dL   Protein, ur NEGATIVE NEGATIVE mg/dL   Nitrite NEGATIVE NEGATIVE   Leukocytes,Ua MODERATE (A) NEGATIVE   RBC / HPF 6-10 0 - 5 RBC/hpf   WBC, UA 6-10 0 - 5 WBC/hpf   Bacteria, UA RARE (A) NONE SEEN   Squamous Epithelial / LPF 0-5 0 - 5   Mucus PRESENT     Comment: Performed at [facility], [place] [address], [place], [place] [address]  CBC WITH DIFFERENTIAL     Status: Abnormal   Collection Time: 05/28/21 11:03 AM  Result Value Ref Range   WBC 6.6 4.0 - 10.5 K/uL   RBC 5.18 (H) 3.87 - 5.11 MIL/uL   Hemoglobin 16.3 (H) 12.0 - 15.0 g/dL   HCT 48.9 (H) 36.0 - 46.0 %   MCV 94.4 80.0 - 100.0 fL   MCH 31.5 26.0 - 34.0 pg   MCHC 33.3 30.0 - 36.0 g/dL   RDW 13.2 11.5 - 15.5 %   Platelets 275 150 - 400 K/uL   nRBC 0.0 0.0 -  0.2 %   Neutrophils Relative % 60 %   Neutro Abs 4.0 1.7 - 7.7 K/uL   Lymphocytes Relative 24 %   Lymphs Abs 1.6 0.7 - 4.0 K/uL   Monocytes Relative 11 %   Monocytes Absolute 0.8 0.1 - 1.0 K/uL   Eosinophils Relative 2 %   Eosinophils Absolute 0.2 0.0 - 0.5 K/uL   Basophils Relative 2 %   Basophils Absolute 0.1 0.0 - 0.1 K/uL   Immature Granulocytes 1 %   Abs Immature Granulocytes 0.03 0.00 - 0.07 K/uL    Comment: Performed at [facility], [place] [address], [place], [place] [address]  Protime-INR     Status: None   Collection Time: 05/28/21 11:03 AM  Result Value Ref Range   Prothrombin Time 12.3 11.4 - 15.2 seconds   INR 0.9 0.8 - 1.2    Comment: (NOTE) INR goal varies based on device and disease states. Performed at [facility], [place] [address], [place], [place] [address]   APTT     Status: None   Collection Time: 05/28/21 11:03 AM  Result Value Ref Range   aPTT 27 24 - 36 seconds    Comment: Performed at [facility], [place] [address], [place], [place] [address]  Type and screen Order type and screen if day of surgery is less than 15 days from draw of preadmission visit or order morning of surgery if day of surgery is greater than 6 days from preadmission visit.     Status: None   Collection Time: 05/28/21 11:03 AM  Result Value Ref Range   ABO/RH(D) O POS    Antibody Screen NEG    Sample Expiration 06/07/2021,2359    Extend sample reason      NO TRANSFUSIONS OR  PREGNANCY IN THE PAST 3 MONTHS Performed at [facility], [place] [address] [place], [place] [address]   Surgical pcr screen     Status: Abnormal   Collection Time: 05/28/21 11:03 AM   Specimen: Nasal Mucosa; Nasal Swab  Result Value Ref Range   MRSA, PCR POSITIVE (A) NEGATIVE    Comment: RESULT CALLED TO, READ BACK BY AND VERIFIED WITH: PALEN,T. RN '@1400'$  ON 08.26.2022 BY COHEN,K    Staphylococcus aureus POSITIVE (A) NEGATIVE    Comment: (NOTE) The Xpert SA Assay (FDA approved for NASAL specimens in patients 27 years of age and older), is one component of a comprehensive surveillance program. It is not intended to diagnose infection nor to guide or monitor treatment. Performed at [facility], [place] [address] [place], [place] [address]   SARS Coronavirus 2 (TAT 6-24 hrs)     Status: None   Collection Time: 06/02/21 12:00 AM  Result Value Ref Range   SARS Coronavirus 2 RESULT: NEGATIVE     Comment: RESULT: NEGATIVESARS-CoV-2 INTERPRETATION:A NEGATIVE  test result means that SARS-CoV-2 RNA was not present in the specimen above the limit of detection of this test. This does not preclude a possible SARS-CoV-2 infection and should not be used as the  sole basis for patient management decisions. Negative results must be combined with clinical observations, patient history, and epidemiological information. Optimum specimen types and timing for peak viral levels during infections caused by SARS-CoV-2  have not been determined. Collection of multiple specimens or types of specimens may be necessary to detect virus. Improper specimen collection and handling, sequence variability under primers/probes, or organism present below the limit of detection may  lead  to false negative results. Positive and negative predictive values of testing are highly dependent on prevalence. False negative test results are more likely when prevalence of disease is high.The expected  result is NEGATIVE.Fact S heet for  Healthcare Providers: LocalChronicle.no Sheet for Patients: SalonLookup.es Reference Range - Negative   ABO/Rh     Status: None   Collection Time: 06/04/21 10:49 AM  Result Value Ref Range   ABO/RH(D)      O POS Performed at [facility], [place] [address], [place], [place] [address]      Estimated body mass index is 30.23 kg/m as calculated from the following:   Height as of this encounter: '5\' 7"'$  (1.702 m).   Weight as of this encounter: 87.5 kg.   Imaging Review Plain radiographs demonstrate severe degenerative joint disease of the left hip(s). The bone quality appears to be fair for age and reported activity level.      Assessment/Plan:  End stage arthritis, left hip(s)  The patient history, physical examination, clinical judgement of the provider and imaging studies are consistent with end stage degenerative joint disease of the left hip(s) and total hip arthroplasty is deemed medically necessary. The treatment options including medical management, injection therapy, arthroscopy and arthroplasty were discussed at length. The risks and benefits of total hip arthroplasty were presented and reviewed. The risks due to aseptic loosening, infection, stiffness, dislocation/subluxation,  thromboembolic complications and other imponderables were discussed.  The patient acknowledged the explanation, agreed to proceed with the plan and consent was signed. Patient is being admitted for inpatient treatment for surgery, pain control, PT, OT, prophylactic antibiotics, VTE prophylaxis, progressive ambulation and ADL's and discharge planning.The patient is planning to be discharged home with home health services

## 2021-06-05 DIAGNOSIS — I10 Essential (primary) hypertension: Secondary | ICD-10-CM | POA: Diagnosis not present

## 2021-06-05 DIAGNOSIS — M1612 Unilateral primary osteoarthritis, left hip: Secondary | ICD-10-CM | POA: Diagnosis not present

## 2021-06-05 DIAGNOSIS — E039 Hypothyroidism, unspecified: Secondary | ICD-10-CM | POA: Diagnosis not present

## 2021-06-05 LAB — CBC
HCT: 33 % — ABNORMAL LOW (ref 36.0–46.0)
Hemoglobin: 11.2 g/dL — ABNORMAL LOW (ref 12.0–15.0)
MCH: 31.7 pg (ref 26.0–34.0)
MCHC: 33.9 g/dL (ref 30.0–36.0)
MCV: 93.5 fL (ref 80.0–100.0)
Platelets: 180 10*3/uL (ref 150–400)
RBC: 3.53 MIL/uL — ABNORMAL LOW (ref 3.87–5.11)
RDW: 13.2 % (ref 11.5–15.5)
WBC: 13.3 10*3/uL — ABNORMAL HIGH (ref 4.0–10.5)
nRBC: 0 % (ref 0.0–0.2)

## 2021-06-05 MED ORDER — SODIUM CHLORIDE 0.9 % IV BOLUS
500.0000 mL | Freq: Once | INTRAVENOUS | Status: AC
  Administered 2021-06-05: 500 mL via INTRAVENOUS

## 2021-06-05 NOTE — Evaluation (Signed)
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  Pt should progress to dc home with family assist and reports first OP PT on Tuesday 06/08/21.    Follow Up Recommendations Follow surgeon's recommendation for DC plan and follow-up therapies    Equipment Recommendations  Rolling walker with 5" wheels    Recommendations for Other Services       Precautions / Restrictions Precautions Precautions: Fall Restrictions Weight Bearing Restrictions: No LLE Weight Bearing: Weight bearing as tolerated      Mobility  Bed Mobility Overal bed mobility: Needs Assistance Bed Mobility: Supine to Sit     Supine to sit: Min assist;Mod assist     General bed mobility comments: cues for sequence and use of R LE to self assist.  Physical assist to control trunk and to manage L LE    Transfers Overall transfer level: Needs assistance Equipment used: Rolling walker (2 wheeled) Transfers: Sit to/from Stand Sit to Stand: Min assist         General transfer comment: cues for LE management and use of UEs to self assist  Ambulation/Gait Ambulation/Gait assistance: Min assist Gait Distance (Feet): 70 Feet Assistive device: Rolling walker (2 wheeled) Gait Pattern/deviations: Step-to pattern;Decreased step length - right;Decreased step length - left;Shuffle;Trunk flexed Gait velocity: decr   General Gait Details: cues for sequence, posture and position from RW - mild dizziness - BP 110/65  Stairs            Wheelchair Mobility    Modified Rankin (Stroke Patients Only)       Balance Overall balance assessment: Needs assistance Sitting-balance support: No upper extremity supported;Feet supported Sitting  balance-Leahy Scale: Fair     Standing balance support: Bilateral upper extremity supported Standing balance-Leahy Scale: Poor                               Pertinent Vitals/Pain Pain Assessment: 0-10 Pain Score: 6  Pain Location: L hip Pain Descriptors / Indicators: Aching;Sore Pain Intervention(s): Limited activity within patient's tolerance;Monitored during session;Premedicated before session;Ice applied    Home Living Family/patient expects to be discharged to:: Private residence Living Arrangements: Spouse/significant other Available Help at Discharge: Family Type of Home: House Home Access: Level entry     Home Layout: One level Home Equipment: Bedside commode;Shower seat      Prior Function Level of Independence: Independent               Hand Dominance   Dominant Hand: Right    Extremity/Trunk Assessment   Upper Extremity Assessment Upper Extremity Assessment: Overall WFL for tasks assessed    Lower Extremity Assessment Lower Extremity Assessment: LLE deficits/detail LLE Deficits / Details: 2/5 strength at hip with AAROM to 80 flex and 10 abd    Cervical / Trunk Assessment Cervical / Trunk Assessment: Normal  Communication   Communication: No difficulties  Cognition Arousal/Alertness: Awake/alert Behavior During Therapy: WFL for tasks assessed/performed Overall Cognitive Status: Within Functional Limits for tasks assessed  General Comments      Exercises Total Joint Exercises Ankle Circles/Pumps: AROM;Both;20 reps;Supine Quad Sets: AROM;Both;10 reps;Supine Heel Slides: AAROM;Left;20 reps;Supine Hip ABduction/ADduction: AAROM;Left;15 reps;Supine   Assessment/Plan    PT Assessment Patient needs continued PT services  PT Problem List Decreased strength;Decreased range of motion;Decreased activity tolerance;Decreased balance;Decreased mobility;Decreased knowledge of use of  DME;Pain       PT Treatment Interventions DME instruction;Gait training;Functional mobility training;Therapeutic activities;Balance training;Therapeutic exercise;Patient/family education    PT Goals (Current goals can be found in the Care Plan section)  Acute Rehab PT Goals Patient Stated Goal: Regain IND PT Goal Formulation: With patient Time For Goal Achievement: 06/12/21 Potential to Achieve Goals: Good    Frequency 7X/week   Barriers to discharge        Co-evaluation               AM-PAC PT "6 Clicks" Mobility  Outcome Measure Help needed turning from your back to your side while in a flat bed without using bedrails?: A Lot Help needed moving from lying on your back to sitting on the side of a flat bed without using bedrails?: A Lot Help needed moving to and from a bed to a chair (including a wheelchair)?: A Lot Help needed standing up from a chair using your arms (e.g., wheelchair or bedside chair)?: A Lot Help needed to walk in hospital room?: A Little Help needed climbing 3-5 steps with a railing? : A Lot 6 Click Score: 13    End of Session Equipment Utilized During Treatment: Gait belt Activity Tolerance: Patient tolerated treatment well Patient left: in chair;with call bell/phone within reach;with chair alarm set;with family/visitor present Nurse Communication: Mobility status PT Visit Diagnosis: Difficulty in walking, not elsewhere classified (R26.2)    Time: [id number] PT Time Calculation (min) (ACUTE ONLY): 35 min   Charges:   PT Evaluation $PT Eval Low Complexity: 1 Low PT Treatments $Therapeutic Exercise: 8-22 mins        Debe Coder PT Acute Rehabilitation Services Pager [phone] Office [phone]   Taija Mathias 06/05/2021, 12:58 PM

## 2021-06-05 NOTE — Progress Notes (Signed)
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  Pt stood and side-stepped up side of bed only before being assisted back to bed.  RN alerted and assessing pt.  Pt's spouse states she has experienced this reaction to some medications in the past - mostly anesthesia.  Follow Up Recommendations  Follow surgeon's recommendation for DC plan and follow-up therapies     Equipment Recommendations  Rolling walker with 5" wheels    Recommendations for Other Services       Precautions / Restrictions Precautions Precautions: Fall Restrictions Weight Bearing Restrictions: No LLE Weight Bearing: Weight bearing as tolerated    Mobility  Bed Mobility Overal bed mobility: Needs Assistance Bed Mobility: Supine to Sit;Sit to Supine     Supine to sit: Min assist;Mod assist Sit to supine: Min assist;Mod assist;+2 for physical assistance;+2 for safety/equipment   General bed mobility comments: cues for sequence and use of R LE to self assist.  Physical assist to control trunk and to manage L LE.  Increased assist back to bed    Transfers Overall transfer level: Needs assistance Equipment used: Rolling walker (2 wheeled) Transfers: Sit to/from Stand Sit to Stand: Min assist;+2 physical assistance;+2 safety/equipment;From elevated surface         General transfer comment: cues for LE management and use of UEs to self assist  Ambulation/Gait Ambulation/Gait assistance: Min assist;+2 safety/equipment Gait Distance (Feet): 3 Feet Assistive device: Rolling walker (2 wheeled) Gait Pattern/deviations: Step-to pattern;Decreased step length - right;Decreased step length - left;Shuffle;Trunk flexed Gait velocity: decr    General Gait Details: Pt side stepped up side of bed only.  Limited by full body shaking   Stairs             Wheelchair Mobility    Modified Rankin (Stroke Patients Only)       Balance Overall balance assessment: Needs assistance Sitting-balance support: No upper extremity supported;Feet supported Sitting balance-Leahy Scale: Fair     Standing balance support: Bilateral upper extremity supported Standing balance-Leahy Scale: Fair                              Cognition Arousal/Alertness: Awake/alert Behavior During Therapy: WFL for tasks assessed/performed Overall Cognitive Status: Within Functional Limits for tasks assessed                                        Exercises Total Joint Exercises Ankle Circles/Pumps: AROM;Both;20 reps;Supine Quad Sets: AROM;Both;10 reps;Supine Heel Slides: AAROM;Left;20 reps;Supine Hip ABduction/ADduction: AAROM;Left;15 reps;Supine    General Comments        Pertinent Vitals/Pain Pain Assessment: 0-10 Pain Score: 8  Pain Location: L hip with move to standing Pain Descriptors / Indicators: Aching;Sore Pain Intervention(s): Limited activity within patient's tolerance;Monitored during session;Premedicated before session    [place] expects to be discharged to:: Private residence Living Arrangements: Spouse/significant other Available Help at Discharge: Family Type of Home: House Home Access: Level entry   Home Layout: One level Home Equipment: Bedside commode;Shower seat      Prior Function Level of  Independence: Independent          PT Goals (current goals can now be found in the care plan section) Acute Rehab PT Goals Patient Stated Goal: Regain IND PT Goal Formulation: With patient Time For Goal Achievement: 06/12/21 Potential to Achieve Goals: Good Progress towards PT goals: Not progressing toward goals - comment    Frequency    7X/week      PT Plan  Current plan remains appropriate    Co-evaluation              AM-PAC PT "6 Clicks" Mobility   Outcome Measure  Help needed turning from your back to your side while in a flat bed without using bedrails?: A Lot Help needed moving from lying on your back to sitting on the side of a flat bed without using bedrails?: A Lot Help needed moving to and from a bed to a chair (including a wheelchair)?: A Lot Help needed standing up from a chair using your arms (e.g., wheelchair or bedside chair)?: A Lot Help needed to walk in hospital room?: A Lot Help needed climbing 3-5 steps with a railing? : A Lot 6 Click Score: 12    End of Session Equipment Utilized During Treatment: Gait belt Activity Tolerance: Other (comment);Patient limited by pain Patient left: with call bell/phone within reach;in bed;with family/visitor present;with nursing/sitter in room Nurse Communication: Mobility status PT Visit Diagnosis: Difficulty in walking, not elsewhere classified (R26.2)     Time: [id number] PT Time Calculation (min) (ACUTE ONLY): 19 min  Charges:  $Therapeutic Exercise: 8-22 mins $Therapeutic Activity: 8-22 mins                     Debe Coder PT Acute Rehabilitation Services Pager [phone] Office [phone]    Rebecca Steele 06/05/2021, 3:32 PM

## 2021-06-05 NOTE — TOC Transition Note (Signed)
Transition of Care [facility]) - CM/SW Discharge Note   Patient Details  Name: Rebecca Steele MRN: [id number] Date of Birth: 12-15-1950  [facility]) CM/SW Contact:  Lennart Pall, LCSW Phone Number: 06/05/2021, 1:39 PM   Clinical Narrative:    Met with pt and reviewed dc needs.  Alerted by [facility] that they are unable to get rw to pt so referral placed with [facility] for delivery to room.  Pt confirms plan for OPPT but clarifies this is set up with [facility] in [place].  No further TOC needs.   Final next level of care: OP Rehab Barriers to Discharge: No Barriers Identified   Patient Goals and CMS Choice Patient states their goals for this hospitalization and ongoing recovery are:: return home      Discharge Placement                       Discharge Plan and Services                DME Arranged: Walker rolling DME Agency: [facility] Date DME Agency Contacted: 06/05/21 Time DME Agency Contacted: [phone] Representative spoke with at DME Agency: [place] Interventions     Readmission Risk Interventions No flowsheet data found.

## 2021-06-05 NOTE — Progress Notes (Signed)
Subjective: 1 Day Post-Op Procedure(s) (LRB): TOTAL HIP ARTHROPLASTY ANTERIOR APPROACH (Left)  Patient comfortable in the bed this morning. She is hoping to go home today.  Activity level:  wbat Diet tolerance:  ok Voiding:  ok Patient reports pain as mild.    Objective: Vital signs in last 24 hours: Temp:  [97.4 F (36.3 C)-98.2 F (36.8 C)] 98.2 F (36.8 C) (09/03 0539) Pulse Rate:  [59-97] 83 (09/03 0539) Resp:  [9-18] 16 (09/03 0539) BP: (84-127)/(55-76) 92/76 (09/03 0539) SpO2:  [94 %-100 %] 97 % (09/03 0539) Weight:  [87.5 kg] 87.5 kg (09/02 1043)  Labs: Recent Labs    06/05/21 0328  HGB 11.2*   Recent Labs    06/05/21 0328  WBC 13.3*  RBC 3.53*  HCT 33.0*  PLT 180   No results for input(s): NA, K, CL, CO2, BUN, CREATININE, GLUCOSE, CALCIUM in the last 72 hours. No results for input(s): LABPT, INR in the last 72 hours.  Physical Exam:  Neurologically intact ABD soft Neurovascular intact Sensation intact distally Intact pulses distally Dorsiflexion/Plantar flexion intact Incision: dressing C/D/I and no drainage No cellulitis present Compartment soft  Assessment/Plan:  1 Day Post-Op Procedure(s) (LRB): TOTAL HIP ARTHROPLASTY ANTERIOR APPROACH (Left) Advance diet Up with therapy D/C IV fluids Discharge home with home health today after PT if cleared and doing well. Follow up in office 2 weeks post op with Dr. Berenice Primas. Continue on asa for DVT prevention.    Larwance Sachs Laraina Sulton 06/05/2021, 8:25 AM

## 2021-06-05 NOTE — Discharge Summary (Addendum)
Patient ID: Rebecca Steele MRN: [id number] DOB/AGE: [birth year] 70 y.o.  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 female who was admitted 06/04/2021 for operative treatment ofPrimary osteoarthritis of left hip. Patient has severe unremitting pain that affects sleep, daily activities, and work/hobbies. After pre-op clearance the patient was taken to the operating room on 06/04/2021 and underwent  Procedure(s): TOTAL HIP ARTHROPLASTY ANTERIOR APPROACH.    Patient was given perioperative antibiotics:  Anti-infectives (From admission, onward)    Start     Dose/Rate Route Frequency Ordered Stop   06/05/21 0000  vancomycin (VANCOCIN) IVPB 1000 mg/200 mL premix        1,000 mg 200 mL/hr over 60 Minutes Intravenous Every 12 hours 06/04/21 1725 06/05/21 0041   06/04/21 1030  vancomycin (VANCOCIN) IVPB 1000 mg/200 mL premix        1,000 mg 200 mL/hr over 60 Minutes Intravenous On call to O.R. 06/04/21 1027 06/04/21 1255   06/04/21 1030  vancomycin (VANCOCIN) IVPB 1000 mg/200 mL premix  Status:  Discontinued        1,000 mg 200 mL/hr over 60 Minutes Intravenous  Once 06/04/21 1027 06/04/21 1720        Patient was given sequential compression devices, early ambulation, and chemoprophylaxis to prevent DVT.  Patient benefited maximally from  hospital stay and there were no complications.    Recent vital signs: Patient Vitals for the past 24 hrs:  BP Temp Temp src Pulse Resp SpO2 Height Weight  06/05/21 0539 92/76 98.2 F (36.8 C) Oral 83 16 97 % -- --  06/05/21 0232 (!) 89/56 -- -- 61 16 95 % -- --  06/05/21 0231 (!) 88/60 -- -- 63 16 95 % -- --  06/05/21 0221 (!) 84/56 97.8 F (36.6 C) Oral (!) 59 16 94 % -- --  06/04/21 2107 (!) 101/58 98 F (36.7 C) Oral 77 16 94 % -- --  06/04/21 1726 91/63 (!) 97.4 F (36.3 C) Oral 82 16 94 % -- --  06/04/21 1700 94/60 (!) 97.4 F (36.3 C) -- 79 12 97 % -- --  06/04/21 1645 103/62 -- -- 78 13 96 % -- --  06/04/21 1630 (!) 108/58 -- -- 83 14 99 % -- --  06/04/21 1615 106/60 97.6 F (36.4 C) -- 78 12 98 % -- --  06/04/21 1600 103/69 -- -- 81 (!) 9 99 % -- --  06/04/21 1545 112/65 -- -- 87 17 100 % -- --  06/04/21 1530 106/75 -- -- 84 15 100 % -- --  06/04/21 1515 (!) 104/55 (!) 97.4 F (36.3 C) -- 95 16 100 % -- --  06/04/21 1513 -- -- -- 97 18 96 % -- --  06/04/21  1057 127/74 97.8 F (36.6 C) Oral 84 18 97 % -- --  06/04/21 1043 -- -- -- -- -- -- '5\' 7"'$  (1.702 m) 87.5 kg     Recent laboratory studies:  Recent Labs    06/05/21 0328  WBC 13.3*  HGB 11.2*  HCT 33.0*  PLT 180     Discharge Medications:   Allergies as of 06/05/2021       Reactions   Gadolinium Derivatives Anaphylaxis, Shortness Of Breath   Pt was given 45m multihance for MRI. After about 199m30 seconds she squeezed the ball saying she felt like her throat was closing up. Exam was DC'd and radiologist gave '75mg'$  benadryl IV.    Other    Pt states she was given Gabapentin and Fentanyl for anesthesia during recent surgery and was unable to wake up for several hours.  Pt states she does not want to have sedation with those medications in the future.   Valium Anaphylaxis   Doxycycline Swelling   Facial swelling   Iohexol Itching    Desc: Pt. stated she had iv contrast around 25 yrs ago and had itching on her  neck.  she took benadryl and it went away.  The rad recommended premeds and be done tomorrow but the pt.did not want to do that so we did her w/o iv contrast today., Onset Date: [id number] Macrodantin Nausea And Vomiting   Red Dye Swelling   Facial swelling (cannot take pink or red tablets or capsules)   Rocephin [ceftriaxone] Other (See Comments)   Joint aches   Zithromax [azithromycin] Other (See Comments)   Weakness, cold/clammy, legs trembling        Medication List     STOP taking these medications    predniSONE 10 MG (21) Tbpk tablet Commonly known as: STERAPRED UNI-PAK 21 TAB       TAKE these medications    acetaminophen 500 MG tablet Commonly known as: TYLENOL Take 500-1,000 mg by mouth every 6 (six) hours as needed for headache or moderate pain.   aspirin EC 325 MG tablet Take 1 tablet (325 mg total) by mouth 2 (two) times daily after a meal. Take x 1 month post op to decrease risk of blood clots. What changed:  medication strength how much to take when to take this additional instructions   butalbital-acetaminophen-caffeine 50-325-40 MG tablet Commonly known as: FIORICET Take 1 tablet by mouth 2 (two) times daily as needed for headache or migraine.   ibuprofen 200 MG tablet Commonly known as: ADVIL Take 200 mg by mouth every 6 (six) hours as needed for headache or moderate pain.   levothyroxine 75 MCG tablet Commonly known as: SYNTHROID Take 75 mcg by mouth daily before breakfast.   loratadine 10 MG tablet Commonly known as: CLARITIN Take 10 mg by mouth daily.   montelukast 10 MG tablet Commonly known as: SINGULAIR Take 10 mg by mouth at bedtime.   oxyCODONE-acetaminophen 5-325 MG tablet Commonly known as: PERCOCET/ROXICET Take 1-2 tablets by mouth every 6 (six) hours as needed for severe pain.   sertraline 100 MG tablet Commonly known as: ZOLOFT Take 100 mg by mouth daily.   tiZANidine 2 MG tablet Commonly known as: ZANAFLEX Take 1 tablet  (2 mg total) by mouth every 8 (eight) hours as needed for muscle spasms.   traZODone 100 MG tablet Commonly known as: DESYREL Take 100 mg by mouth at bedtime.   valsartan-hydrochlorothiazide 320-25 MG tablet Commonly known as: DIOVAN-HCT Take 1 tablet  by mouth daily.   Vitamin D3 25 MCG (1000 UT) Caps Take 2,000 Units by mouth daily.               Durable Medical Equipment  (From admission, onward)           Start     Ordered   06/04/21 1726  DME Walker rolling  Once       Question:  Patient needs a walker to treat with the following condition  Answer:  Primary osteoarthritis of right hip   06/04/21 1725   06/04/21 1726  DME 3 n 1  Once        06/04/21 1725            Diagnostic Studies: DG Chest 2 View  Result Date: 05/28/2021 CLINICAL DATA:  70 year old female with a history of upcoming left hip surgery EXAM: CHEST - 2 VIEW COMPARISON:  12/12/2017 FINDINGS: Cardiomediastinal silhouette unchanged in size and contour, improved lung volumes and inspiratory effort accounting for slightly decreased width. No evidence of central vascular congestion. No interlobular septal thickening. Improved aeration compared to prior, with resolution of the pleural effusions. No pneumothorax or new pleural effusion. Coarsened interstitial markings, with no confluent airspace disease. No acute displaced fracture. Degenerative changes of the spine. IMPRESSION: Negative for acute cardiopulmonary disease Electronically Signed   By: Corrie Mckusick D.O.   On: 05/28/2021 11:45   DG C-Arm 1-60 Min-No Report  Result Date: 06/04/2021 Fluoroscopy was utilized by the requesting physician.  No radiographic interpretation.   DG HIP OPERATIVE UNILAT W OR W/O PELVIS LEFT  Result Date: 06/04/2021 CLINICAL DATA:  Left hip replacement. EXAM: OPERATIVE LEFT HIP (WITH PELVIS IF PERFORMED) TECHNIQUE: Fluoroscopic spot image(s) were submitted for interpretation post-operatively. COMPARISON:  Preoperative  radiograph 04/29/2021 FINDINGS: Two fluoroscopic spot views of the pelvis and left hip obtained in the operating room. Left hip arthroplasty in place. Fluoroscopy time 16 seconds. IMPRESSION: Procedural fluoroscopy for left hip arthroplasty. Electronically Signed   By: Keith Rake M.D.   On: 06/04/2021 15:25    Disposition: Discharge disposition: 01-Home or Self Care       Discharge Instructions     Call MD / Call 911   Complete by: As directed    If you experience chest pain or shortness of breath, CALL 911 and be transported to the hospital emergency room.  If you develope a fever above 101 F, pus (white drainage) or increased drainage or redness at the wound, or calf pain, call your surgeon's office.   Constipation Prevention   Complete by: As directed    Drink plenty of fluids.  Prune juice may be helpful.  You may use a stool softener, such as Colace (over the counter) 100 mg twice a day.  Use MiraLax (over the counter) for constipation as needed.   Diet - low sodium heart healthy   Complete by: As directed    Discharge instructions   Complete by: As directed    INSTRUCTIONS AFTER JOINT REPLACEMENT   Remove items at home which could result in a fall. This includes throw rugs or furniture in walking pathways ICE to the affected joint every three hours while awake for 30 minutes at a time, for at least the first 3-5 days, and then as needed for pain and swelling.  Continue to use ice for pain and swelling. You may notice swelling that will progress down to the foot and ankle.  This is normal after surgery.  Elevate your  leg when you are not up walking on it.   Continue to use the breathing machine you got in the hospital (incentive spirometer) which will help keep your temperature down.  It is common for your temperature to cycle up and down following surgery, especially at night when you are not up moving around and exerting yourself.  The breathing machine keeps your lungs expanded  and your temperature down.   DIET:  As you were doing prior to hospitalization, we recommend a well-balanced diet.  DRESSING / WOUND CARE / SHOWERING  You may shower 3 days after surgery, but keep the wounds dry during showering.  You may use an occlusive plastic wrap (Press'n Seal for example), NO SOAKING/SUBMERGING IN THE BATHTUB.  If the bandage gets wet, change with a clean dry gauze.  If the incision gets wet, pat the wound dry with a clean towel.  ACTIVITY  Increase activity slowly as tolerated, but follow the weight bearing instructions below.   No driving for 6 weeks or until further direction given by your physician.  You cannot drive while taking narcotics.  No lifting or carrying greater than 10 lbs. until further directed by your surgeon. Avoid periods of inactivity such as sitting longer than an hour when not asleep. This helps prevent blood clots.  You may return to work once you are authorized by your doctor.     WEIGHT BEARING   Weight bearing as tolerated with assist device (walker, cane, etc) as directed, use it as long as suggested by your surgeon or therapist, typically at least 4-6 weeks.   EXERCISES  Results after joint replacement surgery are often greatly improved when you follow the exercise, range of motion and muscle strengthening exercises prescribed by your doctor. Safety measures are also important to protect the joint from further injury. Any time any of these exercises cause you to have increased pain or swelling, decrease what you are doing until you are comfortable again and then slowly increase them. If you have problems or questions, call your caregiver or physical therapist for advice.   Rehabilitation is important following a joint replacement. After just a few days of immobilization, the muscles of the leg can become weakened and shrink (atrophy).  These exercises are designed to build up the tone and strength of the thigh and leg muscles and to  improve motion. Often times heat used for twenty to thirty minutes before working out will loosen up your tissues and help with improving the range of motion but do not use heat for the first two weeks following surgery (sometimes heat can increase post-operative swelling).   These exercises can be done on a training (exercise) mat, on the floor, on a table or on a bed. Use whatever works the best and is most comfortable for you.    Use music or television while you are exercising so that the exercises are a pleasant break in your day. This will make your life better with the exercises acting as a break in your routine that you can look forward to.   Perform all exercises about fifteen times, three times per day or as directed.  You should exercise both the operative leg and the other leg as well.  Exercises include:   Quad Sets - Tighten up the muscle on the front of the thigh (Quad) and hold for 5-10 seconds.   Straight Leg Raises - With your knee straight (if you were given a brace, keep it on), lift the  leg to 60 degrees, hold for 3 seconds, and slowly lower the leg.  Perform this exercise against resistance later as your leg gets stronger.  Leg Slides: Lying on your back, slowly slide your foot toward your buttocks, bending your knee up off the floor (only go as far as is comfortable). Then slowly slide your foot back down until your leg is flat on the floor again.  Angel Wings: Lying on your back spread your legs to the side as far apart as you can without causing discomfort.  Hamstring Strength:  Lying on your back, push your heel against the floor with your leg straight by tightening up the muscles of your buttocks.  Repeat, but this time bend your knee to a comfortable angle, and push your heel against the floor.  You may put a pillow under the heel to make it more comfortable if necessary.   A rehabilitation program following joint replacement surgery can speed recovery and prevent re-injury in  the future due to weakened muscles. Contact your doctor or a physical therapist for more information on knee rehabilitation.    CONSTIPATION  Constipation is defined medically as fewer than three stools per week and severe constipation as less than one stool per week.  Even if you have a regular bowel pattern at home, your normal regimen is likely to be disrupted due to multiple reasons following surgery.  Combination of anesthesia, postoperative narcotics, change in appetite and fluid intake all can affect your bowels.   YOU MUST use at least one of the following options; they are listed in order of increasing strength to get the job done.  They are all available over the counter, and you may need to use some, POSSIBLY even all of these options:    Drink plenty of fluids (prune juice may be helpful) and high fiber foods Colace 100 mg by mouth twice a day  Senokot for constipation as directed and as needed Dulcolax (bisacodyl), take with full glass of water  Miralax (polyethylene glycol) once or twice a day as needed.  If you have tried all these things and are unable to have a bowel movement in the first 3-4 days after surgery call either your surgeon or your primary doctor.    If you experience loose stools or diarrhea, hold the medications until you stool forms back up.  If your symptoms do not get better within 1 week or if they get worse, check with your doctor.  If you experience "the worst abdominal pain ever" or develop nausea or vomiting, please contact the office immediately for further recommendations for treatment.   ITCHING:  If you experience itching with your medications, try taking only a single pain pill, or even half a pain pill at a time.  You can also use Benadryl over the counter for itching or also to help with sleep.   TED HOSE STOCKINGS:  Use stockings on both legs until for at least 2 weeks or as directed by physician office. They may be removed at night for  sleeping.  MEDICATIONS:  See your medication summary on the "After Visit Summary" that nursing will review with you.  You may have some home medications which will be placed on hold until you complete the course of blood thinner medication.  It is important for you to complete the blood thinner medication as prescribed.  PRECAUTIONS:  If you experience chest pain or shortness of breath - call 911 immediately for transfer to the hospital emergency  department.   If you develop a fever greater that 101 F, purulent drainage from wound, increased redness or drainage from wound, foul odor from the wound/dressing, or calf pain - CONTACT YOUR SURGEON.                                                   FOLLOW-UP APPOINTMENTS:  If you do not already have a post-op appointment, please call the office for an appointment to be seen by your surgeon.  Guidelines for how soon to be seen are listed in your "After Visit Summary", but are typically between 1-4 weeks after surgery.  OTHER INSTRUCTIONS:   Knee Replacement:  Do not place pillow under knee, focus on keeping the knee straight while resting. CPM instructions: 0-90 degrees, 2 hours in the morning, 2 hours in the afternoon, and 2 hours in the evening. Place foam block, curve side up under heel at all times except when in CPM or when walking.  DO NOT modify, tear, cut, or change the foam block in any way.  POST-OPERATIVE OPIOID TAPER INSTRUCTIONS: It is important to wean off of your opioid medication as soon as possible. If you do not need pain medication after your surgery it is ok to stop day one. Opioids include: Codeine, Hydrocodone(Norco, Vicodin), Oxycodone(Percocet, oxycontin) and hydromorphone amongst others.  Long term and even short term use of opiods can cause: Increased pain response Dependence Constipation Depression Respiratory depression And more.  Withdrawal symptoms can include Flu like symptoms Nausea, vomiting And more Techniques  to manage these symptoms Hydrate well Eat regular healthy meals Stay active Use relaxation techniques(deep breathing, meditating, yoga) Do Not substitute Alcohol to help with tapering If you have been on opioids for less than two weeks and do not have pain than it is ok to stop all together.  Plan to wean off of opioids This plan should start within one week post op of your joint replacement. Maintain the same interval or time between taking each dose and first decrease the dose.  Cut the total daily intake of opioids by one tablet each day Next start to increase the time between doses. The last dose that should be eliminated is the evening dose.     MAKE SURE YOU:  Understand these instructions.  Get help right away if you are not doing well or get worse.    Thank you for letting us be a part of your medical care team.  It is a privilege we respect greatly.  We hope these instructions will help you stay on track for a fast and full recovery!   Increase activity slowly as tolerated   Complete by: As directed    Post-operative opioid taper instructions:   Complete by: As directed    POST-OPERATIVE OPIOID TAPER INSTRUCTIONS: It is important to wean off of your opioid medication as soon as possible. If you do not need pain medication after your surgery it is ok to stop day one. Opioids include: Codeine, Hydrocodone(Norco, Vicodin), Oxycodone(Percocet, oxycontin) and hydromorphone amongst others.  Long term and even short term use of opiods can cause: Increased pain response Dependence Constipation Depression Respiratory depression And more.  Withdrawal symptoms can include Flu like symptoms Nausea, vomiting And more Techniques to manage these symptoms Hydrate well Eat regular healthy meals Stay active Use relaxation techniques(deep breathing, meditating, yoga)  Do Not substitute Alcohol to help with tapering If you have been on opioids for less than two weeks and do not  have pain than it is ok to stop all together.  Plan to wean off of opioids This plan should start within one week post op of your joint replacement. Maintain the same interval or time between taking each dose and first decrease the dose.  Cut the total daily intake of opioids by one tablet each day Next start to increase the time between doses. The last dose that should be eliminated is the evening dose.           Follow-up Information     Dorna Leitz, MD. Go on 06/17/2021.   Specialty: Orthopedic Surgery Why: Your appointment is scheduled for 11:00. Contact information: [place] [address] [phone]         [facility], [place]. Go on 06/08/2021.   Why: You are scheduled to start Outpatient physical therapy. We will call you with a time Contact information: [facility] [address] [place] [address] [phone]                  Signed: Larwance Sachs Carmellia Kreisler 06/05/2021, 8:28 AM

## 2021-06-05 NOTE — Progress Notes (Signed)
Loni Dolly PA saw pt concerning her leg shaking concern. Provider feels like this will pass and pt will be able to ambulate better tomorrow. Pt remains stable. Rn will continue to monitor.

## 2021-06-05 NOTE — Plan of Care (Signed)
Patient is a 69/F admitted 06/04/21 for LTHA - anterior approach. Patient is POD #1. Patient is AA+Ox4, pleasant and conversive. Foley catheter removed; patient is due to void. PRN pains meds given per MAR. Plan of care is potential discharge to home today and discharge orders have been written. However, patient has not yet been seen by PT and patient's SBP is remains soft, although patient typically takes anti-hypertensive meds at home. Patient remains on continuous IVF.    Problem: Education: Goal: Knowledge of the prescribed therapeutic regimen will improve Outcome: Progressing Goal: Understanding of discharge needs will improve Outcome: Progressing Goal: Individualized Educational Video(s) Outcome: Progressing   Problem: Activity: Goal: Ability to avoid complications of mobility impairment will improve Outcome: Progressing Goal: Ability to tolerate increased activity will improve Outcome: Progressing   Problem: Clinical Measurements: Goal: Postoperative complications will be avoided or minimized Outcome: Progressing   Problem: Pain Management: Goal: Pain level will decrease with appropriate interventions Outcome: Progressing   Problem: Skin Integrity: Goal: Will show signs of wound healing Outcome: Progressing   Problem: Education: Goal: Knowledge of General Education information will improve Description: Including pain rating scale, medication(s)/side effects and non-pharmacologic comfort measures Outcome: Progressing   Problem: Health Behavior/Discharge Planning: Goal: Ability to manage health-related needs will improve Outcome: Progressing   Problem: Clinical Measurements: Goal: Ability to maintain clinical measurements within normal limits will improve Outcome: Progressing Goal: Will remain free from infection Outcome: Progressing Goal: Diagnostic test results will improve Outcome: Progressing Goal: Respiratory complications will improve Outcome: Progressing Goal:  Cardiovascular complication will be avoided Outcome: Progressing   Problem: Nutrition: Goal: Adequate nutrition will be maintained Outcome: Progressing   Problem: Coping: Goal: Level of anxiety will decrease Outcome: Progressing   Problem: Elimination: Goal: Will not experience complications related to bowel motility Outcome: Progressing Goal: Will not experience complications related to urinary retention Outcome: Progressing   Problem: Pain Managment: Goal: General experience of comfort will improve Outcome: Progressing   Problem: Safety: Goal: Ability to remain free from injury will improve Outcome: Progressing   Problem: Skin Integrity: Goal: Risk for impaired skin integrity will decrease Outcome: Progressing

## 2021-06-06 DIAGNOSIS — E039 Hypothyroidism, unspecified: Secondary | ICD-10-CM | POA: Diagnosis not present

## 2021-06-06 DIAGNOSIS — I10 Essential (primary) hypertension: Secondary | ICD-10-CM | POA: Diagnosis not present

## 2021-06-06 DIAGNOSIS — M1612 Unilateral primary osteoarthritis, left hip: Secondary | ICD-10-CM | POA: Diagnosis not present

## 2021-06-06 LAB — CBC
HCT: 28.6 % — ABNORMAL LOW (ref 36.0–46.0)
Hemoglobin: 9.7 g/dL — ABNORMAL LOW (ref 12.0–15.0)
MCH: 31.7 pg (ref 26.0–34.0)
MCHC: 33.9 g/dL (ref 30.0–36.0)
MCV: 93.5 fL (ref 80.0–100.0)
Platelets: 166 10*3/uL (ref 150–400)
RBC: 3.06 MIL/uL — ABNORMAL LOW (ref 3.87–5.11)
RDW: 13.5 % (ref 11.5–15.5)
WBC: 11.4 10*3/uL — ABNORMAL HIGH (ref 4.0–10.5)
nRBC: 0 % (ref 0.0–0.2)

## 2021-06-06 NOTE — Progress Notes (Signed)
Subjective: 2 Days Post-Op Procedure(s) (LRB): TOTAL HIP ARTHROPLASTY ANTERIOR APPROACH (Left)  Patient is feeling much better this morning. She feels ready to go home.  Activity level:  wbat Diet tolerance:  ok Voiding:  ok Patient reports pain as mild.    Objective: Vital signs in last 24 hours: Temp:  [97.9 F (36.6 C)-98.8 F (37.1 C)] 98.8 F (37.1 C) (09/04 0524) Pulse Rate:  [71-93] 71 (09/04 0524) Resp:  [16-18] 16 (09/04 0524) BP: (103-143)/(58-65) 103/61 (09/04 0524) SpO2:  [91 %-98 %] 93 % (09/04 0524)  Labs: Recent Labs    06/05/21 0328 06/06/21 0316  HGB 11.2* 9.7*   Recent Labs    06/05/21 0328 06/06/21 0316  WBC 13.3* 11.4*  RBC 3.53* 3.06*  HCT 33.0* 28.6*  PLT 180 166   No results for input(s): NA, K, CL, CO2, BUN, CREATININE, GLUCOSE, CALCIUM in the last 72 hours. No results for input(s): LABPT, INR in the last 72 hours.  Physical Exam:  Neurologically intact ABD soft Neurovascular intact Sensation intact distally Intact pulses distally Dorsiflexion/Plantar flexion intact Incision: dressing C/D/I and no drainage No cellulitis present Compartment soft  Assessment/Plan:  2 Days Post-Op Procedure(s) (LRB): TOTAL HIP ARTHROPLASTY ANTERIOR APPROACH (Left) Advance diet Up with therapy Discharge home with home health today. Continue on asa for DVT prevention. Follow up with Dr. Berenice Primas as scheduled.  Larwance Sachs Azure Budnick 06/06/2021, 9:46 AM

## 2021-06-06 NOTE — Plan of Care (Signed)
Patient discharged.

## 2021-06-06 NOTE — Progress Notes (Signed)
Physical Therapy Treatment Patient Details Name: Rebecca Steele MRN: [id number] DOB: 1951/08/29 Today's Date: 06/06/2021    History of Present Illness Pt s/p L THR and with hx of MVP    PT Comments    Pt feeling much better and with marked improvement in activity tolerance.   Follow Up Recommendations  Follow surgeon's recommendation for DC plan and follow-up therapies     Equipment Recommendations  Rolling walker with 5" wheels    Recommendations for Other Services       Precautions / Restrictions Precautions Precautions: Fall Restrictions Weight Bearing Restrictions: No LLE Weight Bearing: Weight bearing as tolerated    Mobility  Bed Mobility Overal bed mobility: Needs Assistance Bed Mobility: Supine to Sit     Supine to sit: Min guard     General bed mobility comments: cues for sequence and use of R LE to self assist.  Use of gait belt to self assist L LE    Transfers Overall transfer level: Needs assistance Equipment used: Rolling walker (2 wheeled) Transfers: Sit to/from Stand Sit to Stand: Min guard         General transfer comment: cues for LE management and use of UEs to self assist  Ambulation/Gait Ambulation/Gait assistance: Min assist;Min guard Gait Distance (Feet): 100 Feet Assistive device: Rolling walker (2 wheeled) Gait Pattern/deviations: Step-to pattern;Decreased step length - right;Decreased step length - left;Shuffle;Trunk flexed Gait velocity: decr   General Gait Details: cues for sequence, posture and position from [employer]             Wheelchair Mobility    Modified Rankin (Stroke Patients Only)       Balance   Sitting-balance support: No upper extremity supported;Feet supported Sitting balance-Leahy Scale: Good     Standing balance support: No upper extremity supported Standing balance-Leahy Scale: Fair                              Cognition Arousal/Alertness: Awake/alert Behavior During  Therapy: WFL for tasks assessed/performed Overall Cognitive Status: Within Functional Limits for tasks assessed                                        Exercises Total Joint Exercises Ankle Circles/Pumps: AROM;Both;20 reps;Supine Quad Sets: AROM;Both;10 reps;Supine Heel Slides: AAROM;Left;20 reps;Supine Hip ABduction/ADduction: AAROM;Left;15 reps;Supine Long Arc Quad: AAROM;Left;10 reps;Seated    General Comments        Pertinent Vitals/Pain Pain Assessment: 0-10 Pain Score: 3  Pain Location: L hip Pain Descriptors / Indicators: Aching;Sore Pain Intervention(s): Limited activity within patient's tolerance;Monitored during session;Premedicated before session;Ice applied    Home Living                      Prior Function            PT Goals (current goals can now be found in the care plan section) Acute Rehab PT Goals Patient Stated Goal: Regain IND PT Goal Formulation: With patient Time For Goal Achievement: 06/12/21 Potential to Achieve Goals: Good Progress towards PT goals: Progressing toward goals    Frequency    7X/week      PT Plan Current plan remains appropriate    Co-evaluation              AM-PAC PT "6 Clicks" Mobility   Outcome Measure  Help needed turning from your back to your side while in a flat bed without using bedrails?: A Little Help needed moving from lying on your back to sitting on the side of a flat bed without using bedrails?: A Little Help needed moving to and from a bed to a chair (including a wheelchair)?: A Little Help needed standing up from a chair using your arms (e.g., wheelchair or bedside chair)?: A Little Help needed to walk in hospital room?: A Little Help needed climbing 3-5 steps with a railing? : A Little 6 Click Score: 18    End of Session Equipment Utilized During Treatment: Gait belt Activity Tolerance: Patient tolerated treatment well Patient left: with call bell/phone within  reach;in bed;with family/visitor present;with nursing/sitter in room Nurse Communication: Mobility status PT Visit Diagnosis: Difficulty in walking, not elsewhere classified (R26.2)     Time: [id number] PT Time Calculation (min) (ACUTE ONLY): 32 min  Charges:  $Gait Training: 8-22 mins $Therapeutic Exercise: 8-22 mins                     [place] Pager [phone] Office [phone]    Makiah Clauson 06/06/2021, 12:55 PM

## 2021-06-06 NOTE — Progress Notes (Signed)
Physical Therapy Treatment Patient Details Name: Rebecca Steele MRN: [id number] DOB: 1951-01-07 Today's Date: 06/06/2021    History of Present Illness Pt s/p L THR and with hx of MVP    PT Comments    Pt continues motivated progressing well with mobility and feeling much better about dc home this date.  Pt up to ambulate increased distance in hall, negotiated stairs, reviewed bed mobility, reviewed car transfers and reviewed written HEP.    Follow Up Recommendations  Follow surgeon's recommendation for DC plan and follow-up therapies     Equipment Recommendations  Rolling walker with 5" wheels    Recommendations for Other Services       Precautions / Restrictions Precautions Precautions: Fall Restrictions Weight Bearing Restrictions: No LLE Weight Bearing: Weight bearing as tolerated    Mobility  Bed Mobility Overal bed mobility: Needs Assistance Bed Mobility: Sit to Supine     Supine to sit: Min guard Sit to supine: Min assist   General bed mobility comments: Spouse assisting pt L LE onto bed    Transfers Overall transfer level: Needs assistance Equipment used: Rolling walker (2 wheeled) Transfers: Sit to/from Stand Sit to Stand: Min guard;Supervision         General transfer comment: cues for LE management and use of UEs to self assist  Ambulation/Gait Ambulation/Gait assistance: Min guard;Supervision Gait Distance (Feet): 140 Feet Assistive device: Rolling walker (2 wheeled) Gait Pattern/deviations: Step-to pattern;Decreased step length - right;Decreased step length - left;Shuffle;Trunk flexed Gait velocity: decr   General Gait Details: cues for sequence, posture and position from RW   Stairs Stairs: Yes Stairs assistance: Min assist Stair Management: No rails;Step to pattern;Backwards;With walker Number of Stairs: 1 General stair comments: single step bkwd to simulate stool into high bed   Wheelchair Mobility    Modified Rankin (Stroke  Patients Only)       Balance Overall balance assessment: Needs assistance Sitting-balance support: No upper extremity supported;Feet supported Sitting balance-Leahy Scale: Good     Standing balance support: No upper extremity supported Standing balance-Leahy Scale: Fair                              Cognition Arousal/Alertness: Awake/alert Behavior During Therapy: WFL for tasks assessed/performed Overall Cognitive Status: Within Functional Limits for tasks assessed                                        Exercises Total Joint Exercises Ankle Circles/Pumps: AROM;Both;20 reps;Supine Quad Sets: AROM;Both;10 reps;Supine Heel Slides: AAROM;Left;20 reps;Supine Hip ABduction/ADduction: AAROM;Left;15 reps;Supine Long Arc Quad: AAROM;Left;10 reps;Seated    General Comments        Pertinent Vitals/Pain Pain Assessment: 0-10 Pain Score: 3  Pain Location: L hip Pain Descriptors / Indicators: Aching;Sore Pain Intervention(s): Limited activity within patient's tolerance;Monitored during session;Premedicated before session;Ice applied    Home Living                      Prior Function            PT Goals (current goals can now be found in the care plan section) Acute Rehab PT Goals Patient Stated Goal: Regain IND PT Goal Formulation: With patient Time For Goal Achievement: 06/12/21 Potential to Achieve Goals: Good Progress towards PT goals: Progressing toward goals    Frequency    7X/week  PT Plan Current plan remains appropriate    Co-evaluation              AM-PAC PT "6 Clicks" Mobility   Outcome Measure  Help needed turning from your back to your side while in a flat bed without using bedrails?: A Little Help needed moving from lying on your back to sitting on the side of a flat bed without using bedrails?: A Little Help needed moving to and from a bed to a chair (including a wheelchair)?: A Little Help needed  standing up from a chair using your arms (e.g., wheelchair or bedside chair)?: A Little Help needed to walk in hospital room?: A Little Help needed climbing 3-5 steps with a railing? : A Little 6 Click Score: 18    End of Session Equipment Utilized During Treatment: Gait belt Activity Tolerance: Patient tolerated treatment well Patient left: with call bell/phone within reach;in bed;with family/visitor present;with nursing/sitter in room Nurse Communication: Mobility status PT Visit Diagnosis: Difficulty in walking, not elsewhere classified (R26.2)     Time: [id number] PT Time Calculation (min) (ACUTE ONLY): 27 min  Charges:  $Gait Training: 8-22 mins $Therapeutic Exercise: 8-22 mins $Therapeutic Activity: 8-22 mins                     Debe Coder PT Acute Rehabilitation Services Pager [phone] Office [phone]    Roan Sawchuk 06/06/2021, 12:59 PM

## 2021-06-08 DIAGNOSIS — M25652 Stiffness of left hip, not elsewhere classified: Secondary | ICD-10-CM | POA: Diagnosis not present

## 2021-06-08 DIAGNOSIS — Z96642 Presence of left artificial hip joint: Secondary | ICD-10-CM | POA: Diagnosis not present

## 2021-06-08 DIAGNOSIS — M25552 Pain in left hip: Secondary | ICD-10-CM | POA: Diagnosis not present

## 2021-06-08 DIAGNOSIS — M6281 Muscle weakness (generalized): Secondary | ICD-10-CM | POA: Diagnosis not present

## 2021-06-11 ENCOUNTER — Encounter (HOSPITAL_COMMUNITY): Payer: Self-pay | Admitting: Orthopedic Surgery

## 2021-06-14 DIAGNOSIS — M25552 Pain in left hip: Secondary | ICD-10-CM | POA: Diagnosis not present

## 2021-06-14 DIAGNOSIS — M6281 Muscle weakness (generalized): Secondary | ICD-10-CM | POA: Diagnosis not present

## 2021-06-14 DIAGNOSIS — M25652 Stiffness of left hip, not elsewhere classified: Secondary | ICD-10-CM | POA: Diagnosis not present

## 2021-06-14 DIAGNOSIS — Z96642 Presence of left artificial hip joint: Secondary | ICD-10-CM | POA: Diagnosis not present

## 2021-06-15 DIAGNOSIS — I10 Essential (primary) hypertension: Secondary | ICD-10-CM | POA: Diagnosis not present

## 2021-06-15 DIAGNOSIS — R197 Diarrhea, unspecified: Secondary | ICD-10-CM | POA: Diagnosis not present

## 2021-06-15 DIAGNOSIS — E860 Dehydration: Secondary | ICD-10-CM | POA: Diagnosis not present

## 2021-06-15 NOTE — Op Note (Addendum)
PATIENT ID:      Rebecca Steele  MRN:     [id number] DOB/AGE:    [birth year] / 71 y.o.       OPERATIVE REPORT    DATE OF PROCEDURE:  06/04/2021     PREOPERATIVE DIAGNOSIS:  LEFT HIP OSTEOARTHRITIS                                                       Estimated body mass index is 30.23 kg/m as calculated from the following:   Height as of this encounter: '5\' 7"'$  (1.702 m).   Weight as of this encounter: 87.5 kg.     POSTOPERATIVE DIAGNOSIS:  LEFT HIP OSTEOARTHRITIS                                                           PROCEDURE:  1. Left total hip arthroplasty using a 46 mm DePuy Pinnacle Gription cup Cup, [employer],  neutral liner, a +1.5 89m ceramic head,  and a #12  Corail stem, 2.interpretation of multiple intraoperative fluoroscopic images   SURGEON: JAlta Corning   ASSISTANT:   JGaspar SkeetersPA-C  (present throughout entire procedure and necessary for timely completion of the procedure)  ANESTHESIA: Spinal  BLOOD LOSS: 450Cc Tranexamic Acid: 1 gram IV DRAINS: None COMPLICATIONS: None    NDICATIONS FOR PROCEDURE:Patient with end-stage arthritis of the right hip.  X-rays show bone-on-bone arthritic changes. Despite conservative measures with observation, anti-inflammatory medicine, narcotics, use of a cane, has severe unremitting pain and can ambulate only less than 1 block before resting.  Patient desires elective right total hip arthroplasty to decrease pain and increase function. The risks, benefits, and alternatives were discussed at length including but not limited to the risks of infection, bleeding, nerve injury, stiffness, blood clots, the need for revision surgery, cardiopulmonary complications, among others, and they were willing to proceed.Benefits have been discussed. Questions answered.     PROCEDURE IN DETAIL: The patient was identified by armband,  [facility]  room , appropriate anesthetic monitors  were attached and spinal anesthesia was induced.  The patient was placed onto the hot bed and all bony prominences were well-padded.The right hip was prepped and draped for an anterior approach to the hip.  An incision was made and the subcutaneous dissection was down to the level of the tensor fascia.  The fascia was opened and finger dissected.  The bleeders coming across the anterior portion of the hip were identified and cauterized. Retractors were put in place above and below the femoral neck.  The capsule was opened and tagged and a provisional neck cut was made.  The head was removed and sized on the back table.  The acetabulum was sequentially reamed to a level of 45 mm and a 46 mm gription pinnacle cup was hammered into place with 45 of lateral opening and 30 of anteversion.fluoroscopy was used to ensure this position of the cup.The permanent neutral liner for a 28 mm head was placed  Attention was turned towards the femur  where the leg was actually rotated, extended, and adduction did.  The femur was sequentially broached until a size of 12 broach gave a perfect fit and fill.at this point a  1.5 mm delta ceramic hip ball Ice 28 mmwas placed and the hip reduced.  Fluoroscopic images were taken to assess the leg length, fit and fill of the stem, and cup position.  We were happy with the construct at this point.  The 12 broach was removed and a final Corrail stem with standard offset  and a 1.5 mm ceramic hip ball was placed and reduced.  Final images were taken to make certain there were happy with the position at this point.   The capsule was closed with #1 Vicryl suture.  The tensor fascia was closed with 0 Vicryl suture.  The skin was then closed with combination of 0 and 2-0 Vicryl suture.  The top layer was with 3-0 Monocryl suture.  Benzoin and Steri-Strips were applied  and a sterile compressive dressing was applied and the patient taken to recovery room  she noted be in satisfactory condition.  Past medical Motion for the procedure was approximately 300 cc.  Of note lJim Modena Slater was Present for the entire case and assistedwith the entire case and assisted by retraction of tissues, manipulation of the leg, and closing the minimize or time.Of note multiple intraoperative fluoroscopic images were taken throughout the case to ensure cup position, leg length, and fit and fill of the stem.    Alta Corning 03/28/2021, 6:15 PM  Alta Corning 06/15/2021, 2:12 PM

## 2021-06-17 DIAGNOSIS — E860 Dehydration: Secondary | ICD-10-CM | POA: Diagnosis not present

## 2021-06-17 DIAGNOSIS — I959 Hypotension, unspecified: Secondary | ICD-10-CM | POA: Diagnosis not present

## 2021-06-17 DIAGNOSIS — Z471 Aftercare following joint replacement surgery: Secondary | ICD-10-CM | POA: Diagnosis not present

## 2021-06-17 DIAGNOSIS — E876 Hypokalemia: Secondary | ICD-10-CM | POA: Diagnosis not present

## 2021-06-17 DIAGNOSIS — Z96642 Presence of left artificial hip joint: Secondary | ICD-10-CM | POA: Diagnosis not present

## 2021-06-17 DIAGNOSIS — R197 Diarrhea, unspecified: Secondary | ICD-10-CM | POA: Diagnosis not present

## 2021-06-18 DIAGNOSIS — M25652 Stiffness of left hip, not elsewhere classified: Secondary | ICD-10-CM | POA: Diagnosis not present

## 2021-06-18 DIAGNOSIS — Z96642 Presence of left artificial hip joint: Secondary | ICD-10-CM | POA: Diagnosis not present

## 2021-06-18 DIAGNOSIS — M25552 Pain in left hip: Secondary | ICD-10-CM | POA: Diagnosis not present

## 2021-06-18 DIAGNOSIS — M6281 Muscle weakness (generalized): Secondary | ICD-10-CM | POA: Diagnosis not present

## 2021-06-21 DIAGNOSIS — E876 Hypokalemia: Secondary | ICD-10-CM | POA: Diagnosis not present

## 2021-06-21 DIAGNOSIS — I959 Hypotension, unspecified: Secondary | ICD-10-CM | POA: Diagnosis not present

## 2021-06-22 DIAGNOSIS — M25652 Stiffness of left hip, not elsewhere classified: Secondary | ICD-10-CM | POA: Diagnosis not present

## 2021-06-22 DIAGNOSIS — M6281 Muscle weakness (generalized): Secondary | ICD-10-CM | POA: Diagnosis not present

## 2021-06-22 DIAGNOSIS — Z96642 Presence of left artificial hip joint: Secondary | ICD-10-CM | POA: Diagnosis not present

## 2021-06-22 DIAGNOSIS — M25552 Pain in left hip: Secondary | ICD-10-CM | POA: Diagnosis not present

## 2021-06-23 DIAGNOSIS — R197 Diarrhea, unspecified: Secondary | ICD-10-CM | POA: Diagnosis not present

## 2021-06-23 DIAGNOSIS — I959 Hypotension, unspecified: Secondary | ICD-10-CM | POA: Diagnosis not present

## 2021-06-23 DIAGNOSIS — R42 Dizziness and giddiness: Secondary | ICD-10-CM | POA: Diagnosis not present

## 2021-06-23 DIAGNOSIS — I10 Essential (primary) hypertension: Secondary | ICD-10-CM | POA: Diagnosis not present

## 2021-06-23 DIAGNOSIS — E876 Hypokalemia: Secondary | ICD-10-CM | POA: Diagnosis not present

## 2021-06-24 DIAGNOSIS — Z96642 Presence of left artificial hip joint: Secondary | ICD-10-CM | POA: Diagnosis not present

## 2021-06-24 DIAGNOSIS — M25652 Stiffness of left hip, not elsewhere classified: Secondary | ICD-10-CM | POA: Diagnosis not present

## 2021-06-24 DIAGNOSIS — M25552 Pain in left hip: Secondary | ICD-10-CM | POA: Diagnosis not present

## 2021-06-24 DIAGNOSIS — M6281 Muscle weakness (generalized): Secondary | ICD-10-CM | POA: Diagnosis not present

## 2021-06-28 DIAGNOSIS — E876 Hypokalemia: Secondary | ICD-10-CM | POA: Diagnosis not present

## 2021-06-30 DIAGNOSIS — M25552 Pain in left hip: Secondary | ICD-10-CM | POA: Diagnosis not present

## 2021-06-30 DIAGNOSIS — E876 Hypokalemia: Secondary | ICD-10-CM | POA: Diagnosis not present

## 2021-06-30 DIAGNOSIS — Z23 Encounter for immunization: Secondary | ICD-10-CM | POA: Diagnosis not present

## 2021-06-30 DIAGNOSIS — E860 Dehydration: Secondary | ICD-10-CM | POA: Diagnosis not present

## 2021-06-30 DIAGNOSIS — R7989 Other specified abnormal findings of blood chemistry: Secondary | ICD-10-CM | POA: Diagnosis not present

## 2021-06-30 DIAGNOSIS — M25652 Stiffness of left hip, not elsewhere classified: Secondary | ICD-10-CM | POA: Diagnosis not present

## 2021-06-30 DIAGNOSIS — R197 Diarrhea, unspecified: Secondary | ICD-10-CM | POA: Diagnosis not present

## 2021-06-30 DIAGNOSIS — M6281 Muscle weakness (generalized): Secondary | ICD-10-CM | POA: Diagnosis not present

## 2021-06-30 DIAGNOSIS — I10 Essential (primary) hypertension: Secondary | ICD-10-CM | POA: Diagnosis not present

## 2021-06-30 DIAGNOSIS — Z96642 Presence of left artificial hip joint: Secondary | ICD-10-CM | POA: Diagnosis not present

## 2021-07-06 DIAGNOSIS — Z96642 Presence of left artificial hip joint: Secondary | ICD-10-CM | POA: Diagnosis not present

## 2021-07-06 DIAGNOSIS — Z9889 Other specified postprocedural states: Secondary | ICD-10-CM | POA: Diagnosis not present

## 2021-07-06 DIAGNOSIS — M25552 Pain in left hip: Secondary | ICD-10-CM | POA: Diagnosis not present

## 2021-07-18 ENCOUNTER — Encounter (HOSPITAL_BASED_OUTPATIENT_CLINIC_OR_DEPARTMENT_OTHER): Payer: Self-pay

## 2021-07-18 ENCOUNTER — Other Ambulatory Visit: Payer: Self-pay

## 2021-07-18 ENCOUNTER — Emergency Department (HOSPITAL_BASED_OUTPATIENT_CLINIC_OR_DEPARTMENT_OTHER)
Admission: EM | Admit: 2021-07-18 | Discharge: 2021-07-19 | Disposition: A | Discharge status: Home / Self Care | Payer: Medicare HMO | Attending: Emergency Medicine | Admitting: Emergency Medicine

## 2021-07-18 DIAGNOSIS — Z7982 Long term (current) use of aspirin: Secondary | ICD-10-CM | POA: Insufficient documentation

## 2021-07-18 DIAGNOSIS — Z96642 Presence of left artificial hip joint: Secondary | ICD-10-CM | POA: Diagnosis not present

## 2021-07-18 DIAGNOSIS — I10 Essential (primary) hypertension: Secondary | ICD-10-CM | POA: Insufficient documentation

## 2021-07-18 DIAGNOSIS — Z79899 Other long term (current) drug therapy: Secondary | ICD-10-CM | POA: Diagnosis not present

## 2021-07-18 DIAGNOSIS — E039 Hypothyroidism, unspecified: Secondary | ICD-10-CM | POA: Insufficient documentation

## 2021-07-18 DIAGNOSIS — G8918 Other acute postprocedural pain: Secondary | ICD-10-CM | POA: Diagnosis not present

## 2021-07-18 LAB — LACTIC ACID, PLASMA
Lactic Acid, Venous: 2.7 mmol/L (ref 0.5–1.9)
Lactic Acid, Venous: 1.4 mmol/L (ref 0.5–1.9)

## 2021-07-18 LAB — CBC WITH DIFFERENTIAL/PLATELET
Abs Immature Granulocytes: 0.06 10*3/uL (ref 0.00–0.07)
Basophils Absolute: 0.1 10*3/uL (ref 0.0–0.1)
Basophils Relative: 1 %
Eosinophils Absolute: 0.2 10*3/uL (ref 0.0–0.5)
Eosinophils Relative: 2 %
HCT: 40 % (ref 36.0–46.0)
Hemoglobin: 13.2 g/dL (ref 12.0–15.0)
Immature Granulocytes: 1 %
Lymphocytes Relative: 18 %
Lymphs Abs: 1.7 10*3/uL (ref 0.7–4.0)
MCH: 30.6 pg (ref 26.0–34.0)
MCHC: 33 g/dL (ref 30.0–36.0)
MCV: 92.6 fL (ref 80.0–100.0)
Monocytes Absolute: 0.6 10*3/uL (ref 0.1–1.0)
Monocytes Relative: 7 %
Neutro Abs: 6.4 10*3/uL (ref 1.7–7.7)
Neutrophils Relative %: 71 %
Platelets: 272 10*3/uL (ref 150–400)
RBC: 4.32 MIL/uL (ref 3.87–5.11)
RDW: 14 % (ref 11.5–15.5)
WBC: 9 10*3/uL (ref 4.0–10.5)
nRBC: 0 % (ref 0.0–0.2)

## 2021-07-18 LAB — BASIC METABOLIC PANEL
Anion gap: 9 (ref 5–15)
BUN: 10 mg/dL (ref 8–23)
CO2: 24 mmol/L (ref 22–32)
Calcium: 9.1 mg/dL (ref 8.9–10.3)
Chloride: 105 mmol/L (ref 98–111)
Creatinine, Ser: 1.34 mg/dL — ABNORMAL HIGH (ref 0.44–1.00)
GFR, Estimated: 43 mL/min — ABNORMAL LOW (ref >60)
Glucose, Bld: 124 mg/dL — ABNORMAL HIGH (ref 70–99)
Potassium: 3.5 mmol/L (ref 3.5–5.1)
Sodium: 138 mmol/L (ref 135–145)

## 2021-07-18 LAB — SEDIMENTATION RATE: Sed Rate: 12 mm/hr (ref 0–22)

## 2021-07-18 MED ORDER — VANCOMYCIN HCL 500 MG/100ML IV SOLN
500.0000 mg | Freq: Two times a day (BID) | INTRAVENOUS | Status: DC
  Filled 2021-07-18: qty 100

## 2021-07-18 MED ORDER — FAMOTIDINE IN NACL 20-0.9 MG/50ML-% IV SOLN
20.0000 mg | Freq: Once | INTRAVENOUS | Status: AC
  Administered 2021-07-18: 20 mg via INTRAVENOUS
  Filled 2021-07-18: qty 50

## 2021-07-18 MED ORDER — DIPHENHYDRAMINE HCL 50 MG/ML IJ SOLN
50.0000 mg | Freq: Once | INTRAMUSCULAR | Status: AC
  Administered 2021-07-18: 50 mg via INTRAVENOUS
  Filled 2021-07-18: qty 1

## 2021-07-18 MED ORDER — ACETAMINOPHEN 325 MG PO TABS
650.0000 mg | ORAL_TABLET | Freq: Once | ORAL | Status: AC
  Administered 2021-07-18: 650 mg via ORAL
  Filled 2021-07-18: qty 2

## 2021-07-18 MED ORDER — VANCOMYCIN HCL 500 MG IV SOLR
INTRAVENOUS | Status: AC
  Filled 2021-07-18: qty 10

## 2021-07-18 MED ORDER — VANCOMYCIN HCL 500 MG/100ML IV SOLN
500.0000 mg | Freq: Once | INTRAVENOUS | Status: AC
  Administered 2021-07-18: 500 mg via INTRAVENOUS
  Filled 2021-07-18: qty 100

## 2021-07-18 MED ORDER — SODIUM CHLORIDE 0.9 % IV SOLN
INTRAVENOUS | Status: DC | PRN
  Administered 2021-07-18: 250 mL via INTRAVENOUS

## 2021-07-18 MED ORDER — LACTATED RINGERS IV BOLUS
2000.0000 mL | Freq: Once | INTRAVENOUS | Status: AC
  Administered 2021-07-18: 2000 mL via INTRAVENOUS

## 2021-07-18 MED ORDER — VANCOMYCIN HCL IN DEXTROSE 1-5 GM/200ML-% IV SOLN
1000.0000 mg | Freq: Once | INTRAVENOUS | Status: AC
  Administered 2021-07-18: 1000 mg via INTRAVENOUS
  Filled 2021-07-18: qty 200

## 2021-07-18 NOTE — Progress Notes (Signed)
Pharmacy Antibiotic Note  Rebecca Steele is a 70 y.o. female admitted on 07/18/2021 presenting with redness and swelling from surgical site s/p hip replacement, was on bactrim/keflex PTA.  Pharmacy has been consulted for vancomycin dosing.  Plan: Vancomycin 1500 mg IV x 1, then 500 mg IV q 12h (eAUC 442, Goal AUC 400-550, SCr 1.34) Monitor renal function, Cx and clinical progression to narrow vs transition to PO Vancomycin levels as needed  Height: 5\' 7"  (170.2 cm) Weight: 86.2 kg (190 lb) IBW/kg (Calculated) : 61.6  Temp (24hrs), Avg:98.9 F (37.2 C), Min:98.9 F (37.2 C), Max:98.9 F (37.2 C)  Recent Labs  Lab 07/18/21 1956  WBC 9.0  CREATININE 1.34*  LATICACIDVEN 2.7*    Estimated Creatinine Clearance: 44.7 mL/min (A) (by C-G formula based on SCr of 1.34 mg/dL (H)).    Allergies  Allergen Reactions   Gadolinium Derivatives Anaphylaxis and Shortness Of Breath    Pt was given 66ml multihance for MRI. After about 13min30 seconds she squeezed the ball saying she felt like her throat was closing up. Exam was DC'd and radiologist gave 75mg  benadryl IV.    Other     Pt states she was given Gabapentin and Fentanyl for anesthesia during recent surgery and was unable to wake up for several hours.  Pt states she does not want to have sedation with those medications in the future.   Valium Anaphylaxis   Doxycycline Swelling    Facial swelling   Iohexol Itching     Desc: Pt. stated she had iv contrast around 25 yrs ago and had itching on her neck.  she took benadryl and it went away.  The rad recommended premeds and be done tomorrow but the pt.did not want to do that so we did her w/o iv contrast today., Onset Date: [id number]    Macrodantin Nausea And Vomiting   Red Dye Swelling    Facial swelling (cannot take pink or red tablets or capsules)   Rocephin [Ceftriaxone] Other (See Comments)    Joint aches   Zithromax [Azithromycin] Other (See Comments)    Weakness, cold/clammy, legs  trembling    Bertis Ruddy, PharmD Clinical Pharmacist ED Pharmacist Phone # [phone] 07/18/2021 9:37 PM

## 2021-07-18 NOTE — Progress Notes (Signed)
I have reviewed pictures of the patient's wound and have spoken with the emergency room physician.  The patient is afebrile and has no white count and no left shift. He is going to give her a gram of vancomycin and I'll see her at 8:30 in the morning.  We will use warm compresses through the night and I will open this in the office tomorrow.  I believe that it is very superficial given that she had a similar situation just below this area.  If it goes deep I think she will definitely need admission but I think this is just a superficial suture breakdown and abscess.  She had a very similar situation just below this.

## 2021-07-18 NOTE — ED Provider Notes (Signed)
[facility] Provider Note   CSN: [id number] Arrival date & time: 07/18/21  1937     History Chief Complaint  Patient presents with   Post-op Problem    Rebecca Steele is a 69 y.o. female.  HPI Patient presents for redness and swelling around a surgical site.  6 weeks ago, she underwent a left hip replacement with Dr. Berenice Primas.  She reports that 2 weeks ago, she had dehiscence at the site.  She was told that cultures obtained at that time grew MRSA.  She was initially started on Keflex.  She reported GI upset from this medication and stopped taking it.  She had some Bactrim at home from somebody's previous infection and she has been taking that for the past few days.  Over the past 24 hours, she has had significantly increased pain, swelling, and redness. She reports fatigue and subjective fevers today. She reports the ED due to concern of a worsening infection at the surgical site.      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    Patient Active Problem List   Diagnosis Date Noted   Primary osteoarthritis of left hip 06/04/2021   Atypical ductal hyperplasia of right breast 03/19/2018   Obtundation 12/12/2017    Past Surgical History:  Procedure Laterality Date   APPENDECTOMY     CHOLECYSTECTOMY     CYSTOSCOPY W/ RETROGRADES     NECK SURGERY     RADIOACTIVE SEED GUIDED EXCISIONAL BREAST BIOPSY Right 12/12/2017   Procedure: RIGHT RADIOACTIVE SEED GUIDED EXCISIONAL BREAST BIOPSY ERAS PATHWAY;  Surgeon: Rolm Bookbinder, MD;  Location: [facility];  Service: General;  Laterality: Right;  LMA   SHOULDER SURGERY      TONSILLECTOMY     TOTAL HIP ARTHROPLASTY Left 06/04/2021   Procedure: TOTAL HIP ARTHROPLASTY ANTERIOR APPROACH;  Surgeon: Dorna Leitz, MD;  Location: [facility];  Service: Orthopedics;  Laterality: Left;   TRIGGER FINGER RELEASE Right 05/18/2015   Procedure: RIGHT  LONG FINGER TRIGGER RELEASE ;  Surgeon: Milly Jakob, MD;  Location: [facility];  Service: Orthopedics;  Laterality: Right;     OB History   No obstetric history on file.     History reviewed. No pertinent family history.  Social History   Tobacco Use   Smoking status: Never   Smokeless tobacco: Never  Vaping Use   Vaping Use: Never used  Substance Use Topics   Alcohol use: No   Drug use: No    Home Medications Prior to Admission medications   Medication Sig Start Date End Date Taking? Authorizing Provider  acetaminophen (TYLENOL) 500 MG tablet Take 500-1,000 mg by mouth every 6 (six) hours as needed for headache or moderate pain.    [provider]  aspirin EC 325 MG tablet Take 1 tablet (325 mg total) by mouth 2 (two) times daily after a meal. Take x 1 month post op to decrease risk of blood clots. 06/04/21   Gary Fleet, PA-C  butalbital-acetaminophen-caffeine (FIORICET) [phone] MG tablet Take  1 tablet by mouth 2 (two) times daily as needed for headache or migraine.    [provider]  Cholecalciferol (VITAMIN D3) 1000 units CAPS Take 2,000 Units by mouth daily.    [provider]  ibuprofen (ADVIL,MOTRIN) 200 MG tablet Take 200 mg by mouth every 6 (six) hours as needed for headache or moderate pain.    [provider]  levothyroxine (SYNTHROID) 75 MCG tablet Take 75 mcg by mouth daily before breakfast.    [provider]  loratadine (CLARITIN) 10 MG tablet Take 10 mg by mouth daily.    [provider]  montelukast (SINGULAIR) 10 MG tablet Take 10 mg by mouth at bedtime.    [provider]  oxyCODONE-acetaminophen (PERCOCET/ROXICET) 5-325  MG tablet Take 1-2 tablets by mouth every 6 (six) hours as needed for severe pain. 06/04/21   Gary Fleet, PA-C  sertraline (ZOLOFT) 100 MG tablet Take 100 mg by mouth daily.    [provider]  tiZANidine (ZANAFLEX) 2 MG tablet Take 1 tablet (2 mg total) by mouth every 8 (eight) hours as needed for muscle spasms. 06/04/21   Gary Fleet, PA-C  traZODone (DESYREL) 100 MG tablet Take 100 mg by mouth at bedtime.    [provider]  valsartan-hydrochlorothiazide (DIOVAN-HCT) 320-25 MG tablet Take 1 tablet by mouth daily. 01/07/21   [provider]    Allergies    Gadolinium derivatives, Other, Valium, Doxycycline, Iohexol, Macrodantin, Red dye, Rocephin [ceftriaxone], and Zithromax [azithromycin]  Review of Systems   Review of Systems  Constitutional:  Positive for fatigue and fever. Negative for chills.  HENT:  Negative for congestion, ear pain and sore throat.   Eyes:  Negative for pain and visual disturbance.  Respiratory:  Negative for cough, chest tightness and shortness of breath.   Cardiovascular:  Negative for chest pain and palpitations.  Gastrointestinal:  Negative for abdominal pain, diarrhea, nausea and vomiting.  Genitourinary:  Negative for dysuria, flank pain, hematuria and pelvic pain.  Musculoskeletal:  Negative for arthralgias, back pain, myalgias and neck pain.  Skin:  Positive for color change and wound. Negative for rash.  Neurological:  Negative for dizziness, seizures, syncope, weakness, light-headedness, numbness and headaches.  All other systems reviewed and are negative.   Physical Exam Updated Vital Signs BP (!) 144/80 (BP Location: Right Arm)   Pulse 69   Temp 98.9 F (37.2 C) (Oral)   Resp 18   Ht 5\' 7"  (1.702 m)   Wt 86.2 kg   SpO2 97%   BMI 29.76 kg/m   Physical Exam Vitals and nursing note reviewed.  Constitutional:      General: She is not in acute distress.    Appearance: Normal appearance. She is well-developed. She  is not ill-appearing, toxic-appearing or diaphoretic.  HENT:     Head: Normocephalic and atraumatic.     Right Ear: External ear normal.     Left Ear: External ear normal.     Nose: Nose normal.     Mouth/Throat:     Mouth: Mucous membranes are moist.     Pharynx: Oropharynx is clear.  Eyes:     General: No scleral icterus.    Extraocular Movements: Extraocular movements intact.     Conjunctiva/sclera: Conjunctivae normal.  Cardiovascular:     Rate and Rhythm: Normal rate and regular rhythm.     Heart sounds: No murmur heard. Pulmonary:     Effort: Pulmonary effort is normal. No respiratory distress.  Breath sounds: Normal breath sounds. No wheezing or rales.  Abdominal:     Palpations: Abdomen is soft.     Tenderness: There is no abdominal tenderness.  Musculoskeletal:     Cervical back: Normal range of motion and neck supple. No rigidity.     Right lower leg: No edema.     Left lower leg: No edema.  Skin:    General: Skin is warm and dry.     Findings: Erythema present.     Comments: Area of erythema, tenderness, and fluctuance overlying surgical incision on upper left anterior hip  Neurological:     General: No focal deficit present.     Mental Status: She is alert and oriented to person, place, and time.     Cranial Nerves: No cranial nerve deficit.     Sensory: No sensory deficit.     Motor: No weakness.     Coordination: Coordination normal.  Psychiatric:        Mood and Affect: Mood normal.        Behavior: Behavior normal.        Thought Content: Thought content normal.        Judgment: Judgment normal.     ED Results / Procedures / Treatments   Labs (all labs ordered are listed, but only abnormal results are displayed) Labs Reviewed  BASIC METABOLIC PANEL - Abnormal; Notable for the following components:      Result Value   Glucose, Bld 124 (*)    Creatinine, Ser 1.34 (*)    GFR, Estimated 43 (*)    All other components within normal limits  LACTIC  ACID, PLASMA - Abnormal; Notable for the following components:   Lactic Acid, Venous 2.7 (*)    All other components within normal limits  CBC WITH DIFFERENTIAL/PLATELET  LACTIC ACID, PLASMA  SEDIMENTATION RATE  C-REACTIVE PROTEIN    EKG None  Radiology No results found.  Procedures Procedures   Medications Ordered in ED Medications  lactated ringers bolus 2,000 mL (0 mLs Intravenous Stopped 07/18/21 2357)  vancomycin (VANCOCIN) IVPB 1000 mg/200 mL premix (0 mg Intravenous Stopped 07/18/21 2312)    Followed by  vancomycin (VANCOREADY) IVPB 500 mg/100 mL (0 mg Intravenous Stopped 07/18/21 2312)  acetaminophen (TYLENOL) tablet 650 mg (650 mg Oral Given 07/18/21 2207)  diphenhydrAMINE (BENADRYL) injection 50 mg (50 mg Intravenous Given 07/18/21 2314)  famotidine (PEPCID) IVPB 20 mg premix (0 mg Intravenous Stopped 07/18/21 2357)    ED Course  I have reviewed the triage vital signs and the nursing notes.  Pertinent labs & imaging results that were available during my care of the patient were reviewed by me and considered in my medical decision making (see chart for details).    MDM Rules/Calculators/A&P                          Patient is a pleasant 70 year old female who underwent a left hip replacement 6 weeks ago and has been following up with her orthopedic doctor since that time.  She was started on Keflex which was discontinued due to GI upset.  She has been taking Bactrim at home that was leftover from somebody's previous infection.  Over the past 24 hours, she has had increased pain, swelling, erythema to the area of surgical incision.  She also endorses some subjective fevers and fatigue.  Prior to being bedded in the ED, lab work was obtained.  Initial lactic acid was mildly  elevated at 2.7.  Other lab work, however, was reassuring.  Patient has no leukocytosis, no neutrophilia, normal electrolytes.  Her creatinine is slightly above baseline.  Electrolytes are normal.  On  exam, she is well-appearing.  Area of concern does have fluctuance.  Given that this area is directly underlying previous surgical incision, orthopedic surgery was consulted.  Patient was given IV fluids and IV vancomycin.  Orders for laboratory analysis of inflammatory markers were ordered.  I initially spoke with the PA at her orthopedic doctor's office.  He was agreeable to admission for continued IV antibiotics and for evaluation in the morning by Dr. Berenice Primas, patient's orthopedic surgeon.  Patient was agreeable to this initial plan.  Short time later, I did speak with Dr. Berenice Primas directly.  He was able to review her recent work-up.  He is reassured that she does not have a leukocytosis or left shift.  He has evaluated this area before and does feel that the infection is limited to the very superficial area under the incision.  He is not concerned, at this time, of an extensive infection or the need for an extensive washout.  He stated that patient would be better served by getting the vancomycin tonight and going home to see him early in the morning in his office.  He stated that, if bedside incision and drainage did suggest a more severe infection, she could be admitted from his office at that time.  I spoke with the patient about this new plan.  She was initially apprehensive.  She was offered admission if she would feel more comfortable with that.  After consideration, patient was agreeable for plan of vancomycin infusion, discharge home, and follow-up with Dr. Berenice Primas in his office tomorrow morning.  While patient was getting vancomycin, she did have some facial flushing.  This could be secondary to a "red man" syndrome" reaction to the vancomycin.  She denies any other symptoms.  H1 and H2 blockers were ordered.  Patient was able to undergo remaining infusion.  She was discharged in stable condition.   Final Clinical Impression(s) / ED Diagnoses Final diagnoses:  Post-operative pain    Rx / DC  Orders ED Discharge Orders     None        Godfrey Pick, MD 07/20/21 1452

## 2021-07-18 NOTE — ED Triage Notes (Signed)
Pt had left hip replacement 6 weeks ago. 2 weeks ago started having redness and swelling to incision site. Culture showed MRSA.  Had 5 doses of Bactrim and 4 days of Keflex.

## 2021-07-18 NOTE — ED Notes (Signed)
Pt sts she feels her face is swollen; face noted to be red; no hives or significant swelling noted; sxs consistent with Red Man's Syndrome; discussed with EDP; will hold additional 500mg  of Vancomycin at this time.

## 2021-07-18 NOTE — Discharge Instructions (Addendum)
Dr. Berenice Primas would like to see you in his office at 830 tomorrow morning.  Use warm compresses on the area of swelling tonight.  Take pain medicine as needed.  Return to the ED for any worsening of symptoms.

## 2021-07-19 LAB — C-REACTIVE PROTEIN: CRP: 0.8 mg/dL (ref <1.0)

## 2021-07-19 NOTE — ED Notes (Signed)
Pt NAD, a/ox4. Pt verbalizes understanding of all DC and f/u instructions. All questions answered. Pt walks with steady gait to lobby at DC.  ? ?

## 2021-07-20 DIAGNOSIS — T8149XA Infection following a procedure, other surgical site, initial encounter: Secondary | ICD-10-CM | POA: Diagnosis not present

## 2021-07-20 DIAGNOSIS — I10 Essential (primary) hypertension: Secondary | ICD-10-CM | POA: Diagnosis not present

## 2021-08-12 DIAGNOSIS — L0390 Cellulitis, unspecified: Secondary | ICD-10-CM | POA: Diagnosis not present

## 2021-08-12 DIAGNOSIS — R432 Parageusia: Secondary | ICD-10-CM | POA: Diagnosis not present

## 2021-08-12 DIAGNOSIS — G4700 Insomnia, unspecified: Secondary | ICD-10-CM | POA: Diagnosis not present

## 2021-08-12 DIAGNOSIS — I10 Essential (primary) hypertension: Secondary | ICD-10-CM | POA: Diagnosis not present

## 2021-08-12 DIAGNOSIS — R69 Illness, unspecified: Secondary | ICD-10-CM | POA: Diagnosis not present

## 2021-08-25 DIAGNOSIS — H10501 Unspecified blepharoconjunctivitis, right eye: Secondary | ICD-10-CM | POA: Diagnosis not present

## 2021-08-30 DIAGNOSIS — H16141 Punctate keratitis, right eye: Secondary | ICD-10-CM | POA: Diagnosis not present

## 2021-10-04 ENCOUNTER — Emergency Department (HOSPITAL_COMMUNITY): Payer: Medicare HMO

## 2021-10-04 ENCOUNTER — Observation Stay (HOSPITAL_COMMUNITY): Payer: Medicare HMO

## 2021-10-04 ENCOUNTER — Encounter (HOSPITAL_COMMUNITY): Payer: Self-pay | Admitting: Physician Assistant

## 2021-10-04 ENCOUNTER — Other Ambulatory Visit: Payer: Self-pay

## 2021-10-04 ENCOUNTER — Inpatient Hospital Stay (HOSPITAL_COMMUNITY)
Admission: EM | Admit: 2021-10-04 | Discharge: 2021-10-06 | DRG: 516 | Disposition: A | Discharge status: Home / Self Care | Payer: Medicare HMO | Attending: Family Medicine | Admitting: Family Medicine

## 2021-10-04 DIAGNOSIS — Z7982 Long term (current) use of aspirin: Secondary | ICD-10-CM

## 2021-10-04 DIAGNOSIS — E039 Hypothyroidism, unspecified: Secondary | ICD-10-CM | POA: Diagnosis present

## 2021-10-04 DIAGNOSIS — Z6829 Body mass index (BMI) 29.0-29.9, adult: Secondary | ICD-10-CM

## 2021-10-04 DIAGNOSIS — Z7401 Bed confinement status: Secondary | ICD-10-CM

## 2021-10-04 DIAGNOSIS — E876 Hypokalemia: Secondary | ICD-10-CM | POA: Diagnosis not present

## 2021-10-04 DIAGNOSIS — I5032 Chronic diastolic (congestive) heart failure: Secondary | ICD-10-CM | POA: Diagnosis present

## 2021-10-04 DIAGNOSIS — M4856XA Collapsed vertebra, not elsewhere classified, lumbar region, initial encounter for fracture: Principal | ICD-10-CM | POA: Diagnosis present

## 2021-10-04 DIAGNOSIS — Z743 Need for continuous supervision: Secondary | ICD-10-CM | POA: Diagnosis not present

## 2021-10-04 DIAGNOSIS — S0101XA Laceration without foreign body of scalp, initial encounter: Secondary | ICD-10-CM | POA: Diagnosis present

## 2021-10-04 DIAGNOSIS — S32010A Wedge compression fracture of first lumbar vertebra, initial encounter for closed fracture: Secondary | ICD-10-CM | POA: Diagnosis not present

## 2021-10-04 DIAGNOSIS — R001 Bradycardia, unspecified: Secondary | ICD-10-CM | POA: Diagnosis not present

## 2021-10-04 DIAGNOSIS — I10 Essential (primary) hypertension: Secondary | ICD-10-CM | POA: Diagnosis not present

## 2021-10-04 DIAGNOSIS — M546 Pain in thoracic spine: Secondary | ICD-10-CM | POA: Diagnosis not present

## 2021-10-04 DIAGNOSIS — Z981 Arthrodesis status: Secondary | ICD-10-CM | POA: Diagnosis not present

## 2021-10-04 DIAGNOSIS — G4733 Obstructive sleep apnea (adult) (pediatric): Secondary | ICD-10-CM | POA: Diagnosis present

## 2021-10-04 DIAGNOSIS — Z96642 Presence of left artificial hip joint: Secondary | ICD-10-CM | POA: Diagnosis present

## 2021-10-04 DIAGNOSIS — Z419 Encounter for procedure for purposes other than remedying health state, unspecified: Secondary | ICD-10-CM

## 2021-10-04 DIAGNOSIS — S32018A Other fracture of first lumbar vertebra, initial encounter for closed fracture: Secondary | ICD-10-CM

## 2021-10-04 DIAGNOSIS — Z79899 Other long term (current) drug therapy: Secondary | ICD-10-CM

## 2021-10-04 DIAGNOSIS — M545 Low back pain, unspecified: Secondary | ICD-10-CM | POA: Diagnosis not present

## 2021-10-04 DIAGNOSIS — D72829 Elevated white blood cell count, unspecified: Secondary | ICD-10-CM

## 2021-10-04 DIAGNOSIS — Y92009 Unspecified place in unspecified non-institutional (private) residence as the place of occurrence of the external cause: Secondary | ICD-10-CM

## 2021-10-04 DIAGNOSIS — W19XXXA Unspecified fall, initial encounter: Secondary | ICD-10-CM | POA: Diagnosis not present

## 2021-10-04 DIAGNOSIS — Z79891 Long term (current) use of opiate analgesic: Secondary | ICD-10-CM

## 2021-10-04 DIAGNOSIS — Z9102 Food additives allergy status: Secondary | ICD-10-CM

## 2021-10-04 DIAGNOSIS — Z881 Allergy status to other antibiotic agents status: Secondary | ICD-10-CM

## 2021-10-04 DIAGNOSIS — M47816 Spondylosis without myelopathy or radiculopathy, lumbar region: Secondary | ICD-10-CM | POA: Diagnosis not present

## 2021-10-04 DIAGNOSIS — F32A Depression, unspecified: Secondary | ICD-10-CM | POA: Diagnosis present

## 2021-10-04 DIAGNOSIS — S0990XA Unspecified injury of head, initial encounter: Secondary | ICD-10-CM | POA: Diagnosis not present

## 2021-10-04 DIAGNOSIS — I341 Nonrheumatic mitral (valve) prolapse: Secondary | ICD-10-CM | POA: Diagnosis present

## 2021-10-04 DIAGNOSIS — Z885 Allergy status to narcotic agent status: Secondary | ICD-10-CM

## 2021-10-04 DIAGNOSIS — Z91041 Radiographic dye allergy status: Secondary | ICD-10-CM

## 2021-10-04 DIAGNOSIS — Z20822 Contact with and (suspected) exposure to covid-19: Secondary | ICD-10-CM | POA: Diagnosis present

## 2021-10-04 DIAGNOSIS — R42 Dizziness and giddiness: Secondary | ICD-10-CM | POA: Diagnosis not present

## 2021-10-04 DIAGNOSIS — Z888 Allergy status to other drugs, medicaments and biological substances status: Secondary | ICD-10-CM

## 2021-10-04 DIAGNOSIS — W01198A Fall on same level from slipping, tripping and stumbling with subsequent striking against other object, initial encounter: Secondary | ICD-10-CM | POA: Diagnosis present

## 2021-10-04 DIAGNOSIS — S199XXA Unspecified injury of neck, initial encounter: Secondary | ICD-10-CM | POA: Diagnosis not present

## 2021-10-04 DIAGNOSIS — N1831 Chronic kidney disease, stage 3a: Secondary | ICD-10-CM | POA: Diagnosis present

## 2021-10-04 DIAGNOSIS — Z9049 Acquired absence of other specified parts of digestive tract: Secondary | ICD-10-CM

## 2021-10-04 DIAGNOSIS — I130 Hypertensive heart and chronic kidney disease with heart failure and stage 1 through stage 4 chronic kidney disease, or unspecified chronic kidney disease: Secondary | ICD-10-CM | POA: Diagnosis present

## 2021-10-04 DIAGNOSIS — S32019A Unspecified fracture of first lumbar vertebra, initial encounter for closed fracture: Secondary | ICD-10-CM | POA: Diagnosis not present

## 2021-10-04 LAB — CBC WITH DIFFERENTIAL/PLATELET
Abs Immature Granulocytes: 0.07 10*3/uL (ref 0.00–0.07)
Basophils Absolute: 0.1 10*3/uL (ref 0.0–0.1)
Basophils Relative: 1 %
Eosinophils Absolute: 0.1 10*3/uL (ref 0.0–0.5)
Eosinophils Relative: 1 %
HCT: 45.7 % (ref 36.0–46.0)
Hemoglobin: 15.1 g/dL — ABNORMAL HIGH (ref 12.0–15.0)
Immature Granulocytes: 1 %
Lymphocytes Relative: 10 %
Lymphs Abs: 1.1 10*3/uL (ref 0.7–4.0)
MCH: 28.8 pg (ref 26.0–34.0)
MCHC: 33 g/dL (ref 30.0–36.0)
MCV: 87.2 fL (ref 80.0–100.0)
Monocytes Absolute: 0.8 10*3/uL (ref 0.1–1.0)
Monocytes Relative: 7 %
Neutro Abs: 9 10*3/uL — ABNORMAL HIGH (ref 1.7–7.7)
Neutrophils Relative %: 80 %
Platelets: 239 10*3/uL (ref 150–400)
RBC: 5.24 MIL/uL — ABNORMAL HIGH (ref 3.87–5.11)
RDW: 14.5 % (ref 11.5–15.5)
WBC: 11.1 10*3/uL — ABNORMAL HIGH (ref 4.0–10.5)
nRBC: 0 % (ref 0.0–0.2)

## 2021-10-04 LAB — COMPREHENSIVE METABOLIC PANEL
ALT: 18 U/L (ref 0–44)
AST: 21 U/L (ref 15–41)
Albumin: 3.8 g/dL (ref 3.5–5.0)
Alkaline Phosphatase: 82 U/L (ref 38–126)
Anion gap: 13 (ref 5–15)
BUN: 14 mg/dL (ref 8–23)
CO2: 21 mmol/L — ABNORMAL LOW (ref 22–32)
Calcium: 9.4 mg/dL (ref 8.9–10.3)
Chloride: 105 mmol/L (ref 98–111)
Creatinine, Ser: 1.06 mg/dL — ABNORMAL HIGH (ref 0.44–1.00)
GFR, Estimated: 57 mL/min — ABNORMAL LOW (ref >60)
Glucose, Bld: 112 mg/dL — ABNORMAL HIGH (ref 70–99)
Potassium: 3 mmol/L — ABNORMAL LOW (ref 3.5–5.1)
Sodium: 139 mmol/L (ref 135–145)
Total Bilirubin: 1 mg/dL (ref 0.3–1.2)
Total Protein: 6.4 g/dL — ABNORMAL LOW (ref 6.5–8.1)

## 2021-10-04 LAB — RESP PANEL BY RT-PCR (FLU A&B, COVID) ARPGX2
Influenza A by PCR: NEGATIVE
Influenza B by PCR: NEGATIVE
SARS Coronavirus 2 by RT PCR: NEGATIVE

## 2021-10-04 LAB — TROPONIN I (HIGH SENSITIVITY)
Troponin I (High Sensitivity): 4 ng/L (ref <18)
Troponin I (High Sensitivity): 4 ng/L (ref <18)

## 2021-10-04 MED ORDER — ACETAMINOPHEN 650 MG RE SUPP: 650.0000 mg | Freq: Four times a day (QID) | RECTAL | Status: DC | PRN

## 2021-10-04 MED ORDER — SODIUM CHLORIDE 0.9 % IV SOLN: INTRAVENOUS | Status: DC

## 2021-10-04 MED ORDER — SERTRALINE HCL 100 MG PO TABS
100.0000 mg | ORAL_TABLET | Freq: Every day | ORAL | Status: DC
  Administered 2021-10-04 – 2021-10-06 (×3): 100 mg via ORAL
  Filled 2021-10-04 (×3): qty 1

## 2021-10-04 MED ORDER — OXYBUTYNIN CHLORIDE ER 5 MG PO TB24
5.0000 mg | ORAL_TABLET | Freq: Every day | ORAL | Status: DC
  Administered 2021-10-04 – 2021-10-05 (×2): 5 mg via ORAL
  Filled 2021-10-04 (×4): qty 1

## 2021-10-04 MED ORDER — HYDROMORPHONE HCL 1 MG/ML IJ SOLN
1.0000 mg | Freq: Once | INTRAMUSCULAR | Status: AC
  Administered 2021-10-04: 1 mg via INTRAVENOUS
  Filled 2021-10-04: qty 1

## 2021-10-04 MED ORDER — HYDROMORPHONE HCL 1 MG/ML IJ SOLN
0.5000 mg | INTRAMUSCULAR | Status: DC | PRN
Start: 2021-10-04 — End: 2021-10-06
  Administered 2021-10-04 – 2021-10-05 (×4): 0.5 mg via INTRAVENOUS
  Filled 2021-10-04 (×5): qty 0.5

## 2021-10-04 MED ORDER — ONDANSETRON HCL 4 MG PO TABS: 4.0000 mg | ORAL_TABLET | Freq: Four times a day (QID) | ORAL | Status: DC | PRN

## 2021-10-04 MED ORDER — MORPHINE SULFATE (PF) 4 MG/ML IV SOLN
4.0000 mg | Freq: Once | INTRAVENOUS | Status: AC
  Administered 2021-10-04: 4 mg via INTRAVENOUS
  Filled 2021-10-04: qty 1

## 2021-10-04 MED ORDER — POTASSIUM CHLORIDE CRYS ER 20 MEQ PO TBCR
40.0000 meq | EXTENDED_RELEASE_TABLET | Freq: Once | ORAL | Status: AC
  Administered 2021-10-04: 40 meq via ORAL
  Filled 2021-10-04: qty 2

## 2021-10-04 MED ORDER — ENOXAPARIN SODIUM 40 MG/0.4ML IJ SOSY
40.0000 mg | PREFILLED_SYRINGE | INTRAMUSCULAR | Status: DC
  Administered 2021-10-04: 40 mg via SUBCUTANEOUS
  Filled 2021-10-04: qty 0.4

## 2021-10-04 MED ORDER — OXYCODONE-ACETAMINOPHEN 5-325 MG PO TABS
1.0000 | ORAL_TABLET | Freq: Four times a day (QID) | ORAL | Status: DC | PRN
  Administered 2021-10-04 – 2021-10-05 (×2): 1 via ORAL
  Filled 2021-10-04 (×2): qty 1

## 2021-10-04 MED ORDER — BUTALBITAL-APAP-CAFFEINE 50-325-40 MG PO TABS: 1.0000 | ORAL_TABLET | Freq: Two times a day (BID) | ORAL | Status: DC | PRN

## 2021-10-04 MED ORDER — OXYCODONE-ACETAMINOPHEN 5-325 MG PO TABS: 1.0000 | ORAL_TABLET | Freq: Four times a day (QID) | ORAL | Status: DC | PRN

## 2021-10-04 MED ORDER — ONDANSETRON HCL 4 MG/2ML IJ SOLN
4.0000 mg | Freq: Four times a day (QID) | INTRAMUSCULAR | Status: DC | PRN
  Filled 2021-10-04: qty 2

## 2021-10-04 MED ORDER — MONTELUKAST SODIUM 10 MG PO TABS
10.0000 mg | ORAL_TABLET | Freq: Every day | ORAL | Status: DC
  Administered 2021-10-04 – 2021-10-05 (×2): 10 mg via ORAL
  Filled 2021-10-04 (×2): qty 1

## 2021-10-04 MED ORDER — TIZANIDINE HCL 4 MG PO TABS: 2.0000 mg | ORAL_TABLET | Freq: Three times a day (TID) | ORAL | Status: DC | PRN

## 2021-10-04 MED ORDER — TRAZODONE HCL 50 MG PO TABS
100.0000 mg | ORAL_TABLET | Freq: Every day | ORAL | Status: DC
  Administered 2021-10-04 – 2021-10-05 (×2): 100 mg via ORAL
  Filled 2021-10-04 (×2): qty 2

## 2021-10-04 MED ORDER — SODIUM CHLORIDE 0.9% FLUSH
3.0000 mL | Freq: Two times a day (BID) | INTRAVENOUS | Status: DC
  Administered 2021-10-04 – 2021-10-06 (×3): 3 mL via INTRAVENOUS

## 2021-10-04 MED ORDER — ACETAMINOPHEN 325 MG PO TABS
650.0000 mg | ORAL_TABLET | Freq: Four times a day (QID) | ORAL | Status: DC | PRN
  Administered 2021-10-05: 650 mg via ORAL
  Filled 2021-10-04: qty 2

## 2021-10-04 MED ORDER — LORATADINE 10 MG PO TABS
10.0000 mg | ORAL_TABLET | Freq: Every day | ORAL | Status: DC
Start: 2021-10-04 — End: 2021-10-06
  Administered 2021-10-04 – 2021-10-06 (×3): 10 mg via ORAL
  Filled 2021-10-04 (×3): qty 1

## 2021-10-04 NOTE — Plan of Care (Signed)

## 2021-10-04 NOTE — ED Triage Notes (Signed)
Pt BIB [facility] from home began to feel dizzy when changing positions to go to the bathroom and subsequently sustained fall. EMS reports tenderness to lower back on palpation. Presents with laceration to left side of head. C collar in place on arrival. 4 Zofran given in route. 22 G L Forearm. EMS VS 150/84, PR 82, 99%, CBG 104

## 2021-10-04 NOTE — ED Notes (Signed)
Patient transported to CT 

## 2021-10-04 NOTE — ED Notes (Signed)
Pt transported to XR.  

## 2021-10-04 NOTE — Progress Notes (Signed)
Orthopedic Tech Progress Note Patient Details:  Rebecca Steele [birth year] [id number]  Called in order to [employer] for a TLSO BACK BRACE   Patient ID: FRANCA STAKES, female   DOB: [birth year], 71 y.o.   MRN: [id number]  Janit Pagan 10/04/2021, 10:28 AM

## 2021-10-04 NOTE — Discharge Instructions (Addendum)
It was my pleasure taking care of you today!   Your potassium was low and repleted in the ED.  Follow-up with your primary care provider for repeat labs.  Your CT scans were negative for fracture or dislocation.  Your Lumbar x-ray showed L1 fracture with mild height loss.  You are provided a TLSO brace in the ED, wear during the day and weight bear as tolerated.  You will be prescribed Percocet, take as directed.  Do not drive or operate heavy machinery while taking this medication.  You may take over-the-counter 1000 mg tylenol or 600 mg ibuprofen every 6 hours as needed for breakthrough pain.  Attached is information for the on-call Neurosurgeon, you may call their office today and notify them of today's ED visit to schedule follow-up appointment.  You may follow-up with your primary care provider as needed.  Your primary care provider can remove your staples in office in 10 days.  Return to the ED if you are experiencing increasing/worsening vision changes, headache, dizziness, urinating/defecating on yourself, or worsening symptoms.  Neurosurgery Discharge Instructions No restriction in activities, slowly increase your activity back to normal.  Your incision is closed with dermabond (purple glue). This will naturally fall off over the next 1-2 weeks. If it comes off sooner than that, it's nothing to worry about.  Okay to shower on the day of discharge. Use regular soap and water and try to be gentle when cleaning your incisions.  Follow up with Dr. Zada Finders in 2 weeks after discharge. If you do not already have a discharge appointment, please call his office at [phone] to schedule a follow up appointment. If you have any concerns or questions, please call the office and let us know.

## 2021-10-04 NOTE — H&P (Addendum)
History and Physical    [name] HYW:[id number] DOB: Oct 06, 1950 DOA: 10/04/2021  Referring MD/NP/PA: Steva Colder, PA-C PCP: Rebecca Bien, MD  Patient coming from: Home (lives at home with husband) via EMS  Chief Complaint: Fall  I have personally briefly reviewed patient's old medical records in [place]   HPI: Rebecca Steele is a 71 y.o. female with medical history significant of hypertension, mitral valve prolapse,kidney stones, OSA not on CPAP presents after having a fall at home with complaints of back pain.  She had fallen asleep last night on the couch and woke up around 2 AM that he still restroom.  She reported feeling dizzy prior to falling hitting her head on a countertop.  Patient did not report feeling like she lost consciousness. Following the fall she did complain of pain on the left side of her head, nausea, and lower back pain.  Pain in her lower back is severe especially with any kind of movement, but does not radiate.  Denies having any significant shortness of breath, cough, fevers, chills, saddle anesthesia, focal weakness, diarrhea, blood in stools, or recent sick contacts.  ED Course: Upon admission into the emergency department patient was noted to be afebrile with blood pressure 126/68 and 167/88, and all other vital signs maintained.  Labs significant for WBC 11.1, hemoglobin 15.1, potassium 3, BUN 14, creatinine 1.07, and troponin negative x2.  The scan of the head and cervical spine negative for any acute abnormality, but did note laceration of the left scalp.  Laceration was stapled by the ED provider.  Neurosurgery has been consulted and recommending applying a TLSO brace to follow-up with them in the outpatient setting.  However patient unable to ambulate due to pain.  She had been given morphine, Dilaudid IV, and potassium chloride 40 mEq.  Review of Systems  Cardiovascular:  Negative for chest pain.  Gastrointestinal:  Negative for  diarrhea, nausea and vomiting.  Musculoskeletal:  Positive for back pain and falls.  Neurological:  Positive for dizziness.  Otherwise 12-point review of systems was performed and negative except for as noted above in HPI  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    Past Surgical History:  Procedure Laterality Date   APPENDECTOMY     CHOLECYSTECTOMY     CYSTOSCOPY W/ RETROGRADES     NECK SURGERY     RADIOACTIVE SEED GUIDED EXCISIONAL BREAST BIOPSY Right 12/12/2017   Procedure: RIGHT RADIOACTIVE SEED GUIDED EXCISIONAL BREAST BIOPSY ERAS PATHWAY;  Surgeon: Rebecca Bookbinder, MD;  Location: [facility];  Service: General;  Laterality: Right;  LMA   SHOULDER SURGERY     TONSILLECTOMY     TOTAL HIP ARTHROPLASTY Left 06/04/2021   Procedure: TOTAL HIP ARTHROPLASTY ANTERIOR APPROACH;  Surgeon: Rebecca Leitz, MD;  Location: [facility];  Service: Orthopedics;  Laterality: Left;   TRIGGER FINGER RELEASE Right 05/18/2015   Procedure: RIGHT  LONG FINGER TRIGGER RELEASE ;  Surgeon: Rebecca Jakob, MD;  Location: [place] [facility];  Service: Orthopedics;  Laterality: Right;  reports that she has never smoked. She has never used smokeless tobacco. She reports that she does not drink alcohol and does not use drugs.  Allergies  Allergen Reactions   Gadolinium Derivatives Anaphylaxis and Shortness Of Breath    Pt was given 36ml multihance for MRI. After about 65min30 seconds she squeezed the ball saying she felt like her throat was closing up. Exam was DC'd and radiologist gave 75mg  benadryl IV.    Other     Pt states she was given Gabapentin and Fentanyl for anesthesia during  recent surgery and was unable to wake up for several hours.  Pt states she does not want to have sedation with those medications in the future.   Valium Anaphylaxis   Doxycycline Swelling    Facial swelling   Iohexol Itching     Desc: Pt. stated she had iv contrast around 25 yrs ago and had itching on her neck.  she took benadryl and it went away.  The rad recommended premeds and be done tomorrow but the pt.did not want to do that so we did her w/o iv contrast today., Onset Date: [id number]    Macrodantin Nausea And Vomiting   Red Dye Swelling    Facial swelling (cannot take pink or red tablets or capsules)   Rocephin [Ceftriaxone] Other (See Comments)    Joint aches   Zithromax [Azithromycin] Other (See Comments)    Weakness, cold/clammy, legs trembling    History reviewed. No pertinent family history.  Prior to Admission medications   Medication Sig Start Date End Date Taking? Authorizing Provider  albuterol (VENTOLIN HFA) 108 (90 Base) MCG/ACT inhaler Inhale 1-2 puffs into the lungs every 6 (six) hours as needed for wheezing or shortness of breath. 07/03/21  Yes [provider]  butalbital-acetaminophen-caffeine (FIORICET) 50-325-40 MG tablet Take 1 tablet by mouth 2 (two) times daily as needed for headache or migraine.   Yes [provider]  loratadine (CLARITIN) 10 MG tablet Take 10 mg by mouth daily.   Yes [provider]  montelukast (SINGULAIR) 10 MG tablet Take 10 mg by mouth at bedtime.   Yes [provider]  oxybutynin (DITROPAN-XL) 5 MG 24 hr tablet Take 5 mg by mouth daily. 08/31/21  Yes [provider]  sertraline (ZOLOFT) 100 MG tablet Take 100 mg by mouth daily.   Yes [provider]  traZODone (DESYREL) 100 MG tablet Take 100 mg by mouth at bedtime.   Yes [provider]  aspirin EC 325 MG tablet Take 1 tablet (325 mg total) by mouth 2 (two) times daily after a meal. Take x 1 month post op to decrease risk of  blood clots. Patient not taking: Reported on 10/04/2021 06/04/21   Rebecca Fleet, PA-C  oxyCODONE-acetaminophen (PERCOCET/ROXICET) 5-325 MG tablet Take 1-2 tablets by mouth every 6 (six) hours as needed for severe pain. Patient not taking: Reported on 10/04/2021 06/04/21   Rebecca Fleet, PA-C  tiZANidine (ZANAFLEX) 2 MG tablet Take 1 tablet (2 mg total) by mouth every 8 (eight) hours as needed for muscle spasms. Patient not taking: Reported on 10/04/2021 06/04/21   Rebecca Fleet, PA-C    Physical Exam:  Constitutional: Elderly female currently no acute distress at this time Vitals:   10/04/21 1000 10/04/21 1015 10/04/21 1030 10/04/21 1045  BP: 126/78 126/68 139/73 (!) 156/85  Pulse: 63 62 63 86  Resp: 15 11 12 20   Temp:      TempSrc:      SpO2: 98%  96% 98% 98%   Eyes: PERRL, lids and conjunctivae normal ENMT: Mucous membranes are moist. Posterior pharynx clear of any exudate or lesions.  Neck: normal, supple, no masses, no thyromegaly Respiratory: clear to auscultation bilaterally, no wheezing, no crackles. Normal respiratory effort. No accessory muscle use.  Cardiovascular: Regular rate and rhythm, no murmurs / rubs / gallops. No extremity edema. 2+ pedal pulses. No carotid bruits.  Abdomen: no tenderness, no masses palpated. Bowel sounds positive.  Musculoskeletal: no clubbing / cyanosis. Normal muscle tone.  Skin: Laceration to the left side of scalp stapled closed with 8 staples. Neurologic: CN 2-12 grossly intact.  Able to move all extremities.   Psychiatric: Normal judgment and insight. Alert and oriented x 3. Normal mood.     Labs on Admission: I have personally reviewed following labs and imaging studies  CBC: Recent Labs  Lab 10/04/21 0611  WBC 11.1*  NEUTROABS 9.0*  HGB 15.1*  HCT 45.7  MCV 87.2  PLT 341   Basic Metabolic Panel: Recent Labs  Lab 10/04/21 0611  NA 139  K 3.0*  CL 105  CO2 21*  GLUCOSE 112*  BUN 14  CREATININE 1.06*  CALCIUM 9.4   GFR: CrCl  cannot be calculated (Unknown ideal weight.). Liver Function Tests: Recent Labs  Lab 10/04/21 0611  AST 21  ALT 18  ALKPHOS 82  BILITOT 1.0  PROT 6.4*  ALBUMIN 3.8   No results for input(s): LIPASE, AMYLASE in the last 168 hours. No results for input(s): AMMONIA in the last 168 hours. Coagulation Profile: No results for input(s): INR, PROTIME in the last 168 hours. Cardiac Enzymes: No results for input(s): CKTOTAL, CKMB, CKMBINDEX, TROPONINI in the last 168 hours. BNP (last 3 results) No results for input(s): PROBNP in the last 8760 hours. HbA1C: No results for input(s): HGBA1C in the last 72 hours. CBG: No results for input(s): GLUCAP in the last 168 hours. Lipid Profile: No results for input(s): CHOL, HDL, LDLCALC, TRIG, CHOLHDL, LDLDIRECT in the last 72 hours. Thyroid Function Tests: No results for input(s): TSH, T4TOTAL, FREET4, T3FREE, THYROIDAB in the last 72 hours. Anemia Panel: No results for input(s): VITAMINB12, FOLATE, FERRITIN, TIBC, IRON, RETICCTPCT in the last 72 hours. Urine analysis:    Component Value Date/Time   COLORURINE YELLOW 05/28/2021 1005   APPEARANCEUR CLEAR 05/28/2021 1005   LABSPEC 1.015 05/28/2021 1005   PHURINE 5.0 05/28/2021 1005   GLUCOSEU NEGATIVE 05/28/2021 1005   HGBUR MODERATE (A) 05/28/2021 1005   BILIRUBINUR NEGATIVE 05/28/2021 1005   KETONESUR NEGATIVE 05/28/2021 1005   PROTEINUR NEGATIVE 05/28/2021 1005   UROBILINOGEN 0.2 08/23/2011 0312   NITRITE NEGATIVE 05/28/2021 1005   LEUKOCYTESUR MODERATE (A) 05/28/2021 1005   Sepsis Labs: No results found for this or any previous visit (from the past 240 hour(s)).   Radiological Exams on Admission: DG Thoracic Spine 2 View  Result Date: 10/04/2021 CLINICAL DATA:  Fall with back pain EXAM: THORACIC SPINE 2 VIEWS COMPARISON:  None. FINDINGS: No evidence of fracture or subluxation in the thoracic spine. Maintained posterior mediastinal fat planes. Artifact from EKG leads. L1 superior  endplate concavity, reference radiography. IMPRESSION: Negative thoracic spine series. Electronically Signed   By: Jorje Guild M.D.   On: 10/04/2021 07:05   DG Lumbar Spine Complete  Result Date: 10/04/2021 CLINICAL DATA:  Fall with back pain EXAM: LUMBAR SPINE - COMPLETE 4+ VIEW COMPARISON:  Lumbar MRI 10/21/2020 FINDINGS: Superior endplate fracture of L1, right eccentric. This was not seen on lumbar MRI  10/21/2020. Mild degenerative changes for age. IMPRESSION: L1 superior endplate fracture since 1 year prior, likely acute in this setting. Height loss is mild. Electronically Signed   By: Jorje Guild M.D.   On: 10/04/2021 07:04   CT HEAD WO CONTRAST (5MM)  Result Date: 10/04/2021 CLINICAL DATA:  Fall with head laceration EXAM: CT HEAD WITHOUT CONTRAST CT CERVICAL SPINE WITHOUT CONTRAST TECHNIQUE: Multidetector CT imaging of the head and cervical spine was performed following the standard protocol without intravenous contrast. Multiplanar CT image reconstructions of the cervical spine were also generated. COMPARISON:  None. FINDINGS: CT HEAD FINDINGS Brain: No evidence of acute infarction, hemorrhage, hydrocephalus, extra-axial collection or mass lesion/mass effect. Vascular: No hyperdense vessel or unexpected calcification. Skull: Deep left scalp laceration reaching bone. Negative for fracture or focal lesion. Sinuses/Orbits: No acute finding. CT CERVICAL SPINE FINDINGS Alignment: Normal. Skull base and vertebrae: No acute fracture. No primary bone lesion or focal pathologic process. Soft tissues and spinal canal: No prevertebral fluid or swelling. No visible canal hematoma. Retropharyngeal course of the cervical carotids. Disc levels: Ordinary degenerative changes. C6-7 ACDF with solid arthrodesis Upper chest: Negative IMPRESSION: 1. No evidence of acute intracranial or cervical spine injury. 2. Left scalp laceration without calvarial fracture. Electronically Signed   By: Jorje Guild M.D.   On:  10/04/2021 07:32   CT Cervical Spine Wo Contrast  Result Date: 10/04/2021 CLINICAL DATA:  Fall with head laceration EXAM: CT HEAD WITHOUT CONTRAST CT CERVICAL SPINE WITHOUT CONTRAST TECHNIQUE: Multidetector CT imaging of the head and cervical spine was performed following the standard protocol without intravenous contrast. Multiplanar CT image reconstructions of the cervical spine were also generated. COMPARISON:  None. FINDINGS: CT HEAD FINDINGS Brain: No evidence of acute infarction, hemorrhage, hydrocephalus, extra-axial collection or mass lesion/mass effect. Vascular: No hyperdense vessel or unexpected calcification. Skull: Deep left scalp laceration reaching bone. Negative for fracture or focal lesion. Sinuses/Orbits: No acute finding. CT CERVICAL SPINE FINDINGS Alignment: Normal. Skull base and vertebrae: No acute fracture. No primary bone lesion or focal pathologic process. Soft tissues and spinal canal: No prevertebral fluid or swelling. No visible canal hematoma. Retropharyngeal course of the cervical carotids. Disc levels: Ordinary degenerative changes. C6-7 ACDF with solid arthrodesis Upper chest: Negative IMPRESSION: 1. No evidence of acute intracranial or cervical spine injury. 2. Left scalp laceration without calvarial fracture. Electronically Signed   By: Jorje Guild M.D.   On: 10/04/2021 07:32    EKG: Independently reviewed.  Sinus rhythm at 79 bpm  Assessment/Plan L1 compression fracture secondary to fall: Patient presents after having after reporting complaints of dizziness.  Denied complaints of chest pain cardiac troponins were negative x2.  EKG noted sinus rhythm.  X-rays revealed acute L1 superior endplate fracture.  Dr. Zada Finders neurosurgery consulted and patient was placed in a TLSO brace. -Admit to a medical telemetry bed -Continue TLSO brace -Check orthostatics once able -Check TSH -Oxycodone/Dilaudid IV as needed moderate to severe pain respectively -Follow-up CT scan of  the lumbar spine ordered by Neurosurgery -PT/OT to evaluate and treat -Follow-up telemetry overnight.  May warrant further work-up of dizziness -Appreciate neurosurgery consultative services, will follow-up for any further recommendations  Leukocytosis: Acute.  WBC elevated 11.1.  Suspect secondary to the acute fracture. -Recheck CBC tomorrow morning  Scalp laceration: Patient noted to have a 7 cm laceration to the left scalp secondary to fall.  Patient received 8 staples by the ED provider. -Staples should be removed within 7 to 14 days.  Hypokalemia: Acute.  Initial potassium noted to be low at 3.  Patient had been given 40 mEq of potassium chloride. -Continue to monitor and replace as needed  Depression -Continue Zoloft  Diastolic dysfunction: Last EF noted to be 55 -60% with grade 1 dysfunction back in 12/2017.  Patient appears to be euvolemic on physical exam.   DVT prophylaxis: Lovenox Code Status: Full Family Communication: Husband updated at bedside Disposition Plan: Hopefully discharge home once medically stable Consults called: Neurosurgery Admission status: Observation  Norval Morton MD [facility]   If 7PM-7AM, please contact night-coverage   10/04/2021, 2:07 PM

## 2021-10-04 NOTE — Consult Note (Signed)
Neurosurgery Consultation  Reason for Consult: Lumbar spine fracture Referring Physician: Tamala Julian  CC: Low back pain  HPI: This is a 71 y.o. woman s/p fall that presents with severe low back pain. No radiation into the legs or radicular symptoms, no new weakness, numbness, or parasthesias, no recent change in bowel or bladder function. No recent use of anti-platelet or anti-coagulant medications, chart review has ASA325 listed as outpt med but pt is not taking it any longer.    ROS: A 14 point ROS was performed and is negative except as noted in the HPI.   PMHx:  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   FamHx: History reviewed. No pertinent family history. SocHx:  reports that she has never smoked. She has never used smokeless tobacco. She reports that she does not drink alcohol and does not use drugs.  Exam: Vital signs in last 24 hours: Temp:  [98.2 F (36.8 C)] 98.2 F (36.8 C) (01/02 0617) Pulse Rate:  [62-86] 86 (01/02 1045) Resp:  [10-20] 20 (01/02 1045) BP: (126-167)/(68-92) 156/85 (01/02 1045) SpO2:  [92 %-99 %] 98 % (01/02 1045) General: Awake, alert, cooperative, lying in bed in NAD Head: Normocephalic, L scalp lac  HEENT: Neck supple Pulmonary: breathing room air comfortably, no evidence of increased work of breathing Cardiac: RRR Abdomen: S NT ND Extremities: Warm and well perfused x4 Neuro: AOx3, PERRL, EOMI, FS Strength 5/5 x4, SILTx4  Assessment and Plan: 71 y.o.woman s/p fall w/ severe low back pain. XR L-sp personally reviewed, which shows L1 compression fracture.   -pain is very severe, we should get a CT L-spine to better evaluate fracture anatomy  and eval for other fractures -okay for reg diet from my standpoint -discussed w/ the pt that if her pain is refractory meaning she's non-ambulatory despite medications and her TLSO, and the CT confirms just a compression frx, she'd be a candidate for a kyphoplasty at that level. Will re-evaluate tomorrow to see if she's regained ambulation.  -please call with any concerns or questions  Judith Part, MD 10/04/21 12:28 PM [facility]

## 2021-10-04 NOTE — ED Provider Notes (Signed)
[facility] EMERGENCY DEPARTMENT Provider Note   CSN: [id number] Arrival date & time: 10/04/21  0601     History  Chief Complaint  Patient presents with   Rebecca Steele Rebecca Steele is a 71 y.o. female with a PMHx of HTN, hypothyroidism, who presents to the Emergency Department complaining of a fall onset 2 AM PTA. Pt notes that she went to use the bathroom when she became dizzy and fell striking her left head on the countertop. Pt has associated hitting her head, HA (localized to left side), resolved nausea, and mid to lower back pain. She not tried any medications for her symptoms. She denies LOC, vision changes, vomiting, chest pain, shortness of breath, HA, fever, chills, dizziness. Denies anticoagulant use. Pt lives at home with her spouse and she is currently retired. She used to be a Freight forwarder for [employer]. Denies history of anemia.   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 No language interpreter was used.      Home Medications Prior to Admission medications   Medication Sig Start Date End Date Taking? Authorizing Provider  albuterol (VENTOLIN HFA) 108 (90 Base) MCG/ACT inhaler Inhale 1-2 puffs into the lungs every 6 (six) hours as needed for wheezing or shortness of breath. 07/03/21  Yes [provider]  butalbital-acetaminophen-caffeine (FIORICET) 50-325-40 MG tablet Take 1 tablet by mouth 2 (two) times daily as needed for headache or migraine.   Yes [provider]  loratadine (CLARITIN) 10 MG tablet Take 10 mg by mouth daily.   Yes [provider]  montelukast  (SINGULAIR) 10 MG tablet Take 10 mg by mouth at bedtime.   Yes [provider]  oxybutynin (DITROPAN-XL) 5 MG 24 hr tablet Take 5 mg by mouth daily. 08/31/21  Yes [provider]  sertraline (ZOLOFT) 100 MG tablet Take 100 mg by mouth daily.   Yes [provider]  traZODone (DESYREL) 100 MG tablet Take 100 mg by mouth at bedtime.   Yes [provider]  aspirin EC 325 MG tablet Take 1 tablet (325 mg total) by mouth 2 (two) times daily after a meal. Take x 1 month post op to decrease risk of blood clots. Patient not taking: Reported on 10/04/2021 06/04/21   Gary Fleet, PA-C  oxyCODONE-acetaminophen (PERCOCET/ROXICET) 5-325 MG tablet Take 1-2 tablets by mouth every 6 (six) hours as needed for severe pain. Patient not taking: Reported on 10/04/2021 06/04/21   Gary Fleet, PA-C  tiZANidine (ZANAFLEX) 2 MG tablet Take 1 tablet (2 mg total) by mouth every 8 (eight) hours as needed for muscle spasms. Patient not taking: Reported on 10/04/2021 06/04/21   Gary Fleet, PA-C      Allergies    Gadolinium derivatives, Other, Valium, Doxycycline, Iohexol, Macrodantin, Red dye, Rocephin [ceftriaxone], and Zithromax [azithromycin]    Review of Systems   Review of Systems  Constitutional:  Negative for chills and fever.  Eyes:  Negative for visual disturbance.  Respiratory:  Negative for shortness of breath.   Cardiovascular:  Negative for chest pain.  Gastrointestinal:  Positive for nausea. Negative for abdominal pain and vomiting.  Musculoskeletal:  Positive for back pain (mid to lower).  Skin:  Positive for wound (laceration to left sided head). Negative for rash.  Neurological:  Positive for headaches. Negative for dizziness and syncope.  All other systems reviewed and are negative.  Physical Exam Updated Vital Signs BP (!) 156/85    Pulse 86    Temp 98.2 F (36.8 C) (Oral)    Resp 20    SpO2 98%  Physical Exam Vitals and nursing note reviewed.  Constitutional:       General: She is not in acute distress.    Appearance: She is not diaphoretic.  HENT:     Head: Normocephalic and atraumatic. No raccoon eyes.      Comments: 7 cm laceration to the left parietal with mild TTP. See picture below.    Nose: Nose normal. No congestion or rhinorrhea.     Mouth/Throat:     Pharynx: No oropharyngeal exudate.  Eyes:     General: No scleral icterus.    Extraocular Movements: Extraocular movements intact.     Conjunctiva/sclera: Conjunctivae normal.     Pupils: Pupils are equal, round, and reactive to light.  Neck:     Comments: C-collar in place Cardiovascular:     Rate and Rhythm: Normal rate and regular rhythm.     Pulses: Normal pulses.     Heart sounds: Normal heart sounds.  Pulmonary:     Effort: Pulmonary effort is normal. No respiratory distress.     Breath sounds: Normal breath sounds. No wheezing.  Abdominal:     General: Bowel sounds are normal.     Palpations: Abdomen is soft. There is no mass.     Tenderness: There is no abdominal tenderness. There is no guarding or rebound.  Musculoskeletal:        General: Normal range of motion.     Cervical back: Normal, normal range of motion and neck supple.     Lumbar back: Bony tenderness present.     Comments: Tenderness to palpation to her lumbar spine.  No tenderness to palpation to cervical or thoracic spine.  No overlying skin changes.  No obvious step-offs or crepitus noted.  Full active range of motion of bilateral lower extremities.  Strength and sensation intact to bilateral lower extremities. PT and DP pulses intact bilaterally.   Skin:    General: Skin is warm and dry.  Neurological:     Mental Status: She is alert.  Psychiatric:        Behavior: Behavior normal.       ED Results / Procedures / Treatments   Labs (all labs ordered are listed, but only abnormal results are displayed) Labs Reviewed  CBC WITH DIFFERENTIAL/PLATELET - Abnormal; Notable for the following components:       Result Value   WBC 11.1 (*)    RBC 5.24 (*)    Hemoglobin 15.1 (*)    Neutro Abs 9.0 (*)    All other components within normal limits  COMPREHENSIVE METABOLIC PANEL - Abnormal; Notable for the following components:   Potassium 3.0 (*)    CO2 21 (*)    Glucose, Bld 112 (*)    Creatinine, Ser 1.06 (*)    Total Protein 6.4 (*)    GFR, Estimated 57 (*)    All other components within normal limits  URINALYSIS,  ROUTINE W REFLEX MICROSCOPIC  TROPONIN I (HIGH SENSITIVITY)  TROPONIN I (HIGH SENSITIVITY)    EKG EKG Interpretation  Date/Time:  Monday October 04 2021 06:12:24 EST Ventricular Rate:  79 PR Interval:  140 QRS Duration: 102 QT Interval:  393 QTC Calculation: 451 R Axis:   18 Text Interpretation: Sinus rhythm Borderline T abnormalities, anterior leads Baseline wander in lead(s) V4 Interpretation limited secondary to artifact Confirmed by Ripley Fraise [phone]) on 10/04/2021 6:17:31 AM  Radiology DG Thoracic Spine 2 View  Result Date: 10/04/2021 CLINICAL DATA:  Fall with back pain EXAM: THORACIC SPINE 2 VIEWS COMPARISON:  None. FINDINGS: No evidence of fracture or subluxation in the thoracic spine. Maintained posterior mediastinal fat planes. Artifact from EKG leads. L1 superior endplate concavity, reference radiography. IMPRESSION: Negative thoracic spine series. Electronically Signed   By: Jorje Guild M.D.   On: 10/04/2021 07:05   DG Lumbar Spine Complete  Result Date: 10/04/2021 CLINICAL DATA:  Fall with back pain EXAM: LUMBAR SPINE - COMPLETE 4+ VIEW COMPARISON:  Lumbar MRI 10/21/2020 FINDINGS: Superior endplate fracture of L1, right eccentric. This was not seen on lumbar MRI 10/21/2020. Mild degenerative changes for age. IMPRESSION: L1 superior endplate fracture since 1 year prior, likely acute in this setting. Height loss is mild. Electronically Signed   By: Jorje Guild M.D.   On: 10/04/2021 07:04   CT HEAD WO CONTRAST (5MM)  Result Date: 10/04/2021 CLINICAL  DATA:  Fall with head laceration EXAM: CT HEAD WITHOUT CONTRAST CT CERVICAL SPINE WITHOUT CONTRAST TECHNIQUE: Multidetector CT imaging of the head and cervical spine was performed following the standard protocol without intravenous contrast. Multiplanar CT image reconstructions of the cervical spine were also generated. COMPARISON:  None. FINDINGS: CT HEAD FINDINGS Brain: No evidence of acute infarction, hemorrhage, hydrocephalus, extra-axial collection or mass lesion/mass effect. Vascular: No hyperdense vessel or unexpected calcification. Skull: Deep left scalp laceration reaching bone. Negative for fracture or focal lesion. Sinuses/Orbits: No acute finding. CT CERVICAL SPINE FINDINGS Alignment: Normal. Skull base and vertebrae: No acute fracture. No primary bone lesion or focal pathologic process. Soft tissues and spinal canal: No prevertebral fluid or swelling. No visible canal hematoma. Retropharyngeal course of the cervical carotids. Disc levels: Ordinary degenerative changes. C6-7 ACDF with solid arthrodesis Upper chest: Negative IMPRESSION: 1. No evidence of acute intracranial or cervical spine injury. 2. Left scalp laceration without calvarial fracture. Electronically Signed   By: Jorje Guild M.D.   On: 10/04/2021 07:32   CT Cervical Spine Wo Contrast  Result Date: 10/04/2021 CLINICAL DATA:  Fall with head laceration EXAM: CT HEAD WITHOUT CONTRAST CT CERVICAL SPINE WITHOUT CONTRAST TECHNIQUE: Multidetector CT imaging of the head and cervical spine was performed following the standard protocol without intravenous contrast. Multiplanar CT image reconstructions of the cervical spine were also generated. COMPARISON:  None. FINDINGS: CT HEAD FINDINGS Brain: No evidence of acute infarction, hemorrhage, hydrocephalus, extra-axial collection or mass lesion/mass effect. Vascular: No hyperdense vessel or unexpected calcification. Skull: Deep left scalp laceration reaching bone. Negative for fracture or focal  lesion. Sinuses/Orbits: No acute finding. CT CERVICAL SPINE FINDINGS Alignment: Normal. Skull base and vertebrae: No acute fracture. No primary bone lesion or focal pathologic process. Soft tissues and spinal canal: No prevertebral fluid or swelling. No visible canal hematoma. Retropharyngeal course of the cervical carotids. Disc levels: Ordinary degenerative changes. C6-7 ACDF with solid arthrodesis Upper chest: Negative IMPRESSION: 1. No evidence of acute intracranial or cervical spine injury. 2. Left scalp laceration without calvarial fracture. Electronically  Signed   By: Jorje Guild M.D.   On: 10/04/2021 07:32    Procedures .Marland KitchenLaceration Repair  Date/Time: 10/04/2021 10:08 AM Performed by: Steva Colder A, PA-C Authorized by: Nehemiah Settle, PA-C   Consent:    Consent obtained:  Verbal   Consent given by:  Patient   Risks, benefits, and alternatives were discussed: yes     Risks discussed:  Pain and infection Universal protocol:    Imaging studies available: yes     Site/side marked: yes     Immediately prior to procedure, a time out was called: yes     Patient identity confirmed:  Verbally with patient and hospital-assigned identification number Anesthesia:    Anesthesia method:  None Laceration details:    Location:  Scalp   Scalp location:  L parietal   Length (cm):  7 Pre-procedure details:    Preparation:  Patient was prepped and draped in usual sterile fashion and imaging obtained to evaluate for foreign bodies Exploration:    Imaging outcome: foreign body not noted     Wound exploration: entire depth of wound visualized     Contaminated: no   Treatment:    Area cleansed with:  Saline   Amount of cleaning:  Standard   Irrigation solution:  Sterile saline   Irrigation method:  Syringe   Visualized foreign bodies/material removed: no   Skin repair:    Repair method:  Staples   Number of staples:  8 Approximation:    Approximation:  Close Repair type:    Repair  type:  Simple Post-procedure details:    Dressing:  Sterile dressing   Procedure completion:  Tolerated well, no immediate complications   Medications Ordered in ED Medications  potassium chloride SA (KLOR-CON M) CR tablet 40 mEq (has no administration in time range)  morphine 4 MG/ML injection 4 mg (4 mg Intravenous Given 10/04/21 0616)  morphine 4 MG/ML injection 4 mg (4 mg Intravenous Given 10/04/21 0916)  HYDROmorphone (DILAUDID) injection 1 mg (1 mg Intravenous Given 10/04/21 1157)    ED Course/ Medical Decision Making/ A&P Clinical Course as of 10/04/21 1219  Mon Oct 04, 2021  0918 Patient reevaluated and notified of imaging findings.  Additional pain medication ordered.  Pt agreeable with consult to neurosurgery. [SB]  0920 Consult, Neurosurgeon, Dr. Zada Finders, although pt has been medicated, no pain at rest. Will plan for conservative management. [SB]  1005 Discussed with patient due to extensive pain, will work on hospital admission for pain management and further neurosurgeon eval. Pt and husband agreeable at bedside. [SB]  42 Consult with Dr. Arlean Hopping, Dr. Tamala Julian who will admit the patient to the hospital for pain management. [SB]    Clinical Course User Index [SB] Mads Borgmeyer A, PA-C                           Medical Decision Making  This patient presents to the ED for concern of fall and head injury onset prior to arrival, this involves an extensive number of treatment options, and is a complaint that carries with it a high risk of complications and morbidity.  The differential diagnosis includes lumbar fracture or dislocation, muscle strain, DDD, spinal stenosis, cauda equina.  Co morbidities that complicate the patient evaluation  Hypertension, hypothyroidism  Lab Tests: I ordered, and personally interpreted labs. The pertinent results include: Troponins unremarkable.  CMP with hypokalemia, improving creatinine at 1.06, CBC with elevated white count at 11.1.  Imaging Studies ordered: I ordered imaging studies including CT head, CT cervical spine, thoracic x-ray, lumbar x-ray. I independently visualized and interpreted imaging which showed no acute intracranial abnormality.  No acute fracture or dislocation to thoracic spine. Lumbar x-ray with L1 fracture with mild height loss. I agree with the radiologist interpretation  Medicines ordered and prescription drug management: I ordered medication including morphine x2 and Dilaudid for pain management Reevaluation of the patient after these medicines showed that the patient improved I have reviewed the patients home medicines and have made adjustments as needed  Consultations Obtained: I requested consultation with the Neurosurgeon, Dr. Zada Finders,  and discussed lab and imaging findings as well as pertinent plan - they recommend: pain management and follow up in outpatient setting.   Problem List / ED Course: Fall: Pt presentation suspicious for lumbar fracture status post fall onset this morning PTA.  Patient not on anticoagulants at this time.  CT head and cervical spine negative.  Thoracic x-ray without acute fracture or dislocation.  Lumbar x-ray with L1 fracture with mild height loss.  Hypokalemia: Patient with potassium at 3.0, repleted in the ED with 40 mEq PO.  Clinical Course as of 10/04/21 [place] Oct 04, 2021  0918 Patient reevaluated and notified of imaging findings.  Additional pain medication ordered.  Pt agreeable with consult to neurosurgery. [SB]  0920 Consult, Neurosurgeon, Dr. Zada Finders, although pt has been medicated, no pain at rest. Will plan for conservative management. [SB]  1005 Discussed with patient due to extensive pain, will work on hospital admission for pain management and further neurosurgeon eval. Pt and husband agreeable at bedside. [SB]  1138 Consult with Dr. Arlean Hopping, Dr. Tamala Julian who will admit the patient to the hospital for pain management. 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  She used to work as a Freight forwarder for [employer]  Dispostion: After consideration of the diagnostic results and the patients response to treatment, I feel that the patent would benefit from admission to the hospital for further pain management. Pt will be given a TLSO brace prior to admission. Pt to follow up with Neurosurgeon in the outpatient setting.    This chart was dictated using voice recognition software, Dragon. Despite the best efforts of this provider to proofread and correct errors, errors may still occur which can change documentation meaning.   Final Clinical Impression(s) / ED Diagnoses Final diagnoses:  Fall, initial encounter  Injury of head, initial encounter  Other closed fracture of first lumbar vertebra, initial encounter [facility])    Rx / DC Orders ED Discharge Orders     None         Tyrion Glaude A, PA-C 10/04/21 1221    Horton, Alvin Critchley, DO 10/04/21 1546

## 2021-10-04 NOTE — ED Notes (Signed)
Patient transported to X-ray 

## 2021-10-05 ENCOUNTER — Encounter (HOSPITAL_COMMUNITY): Payer: Self-pay | Admitting: Family Medicine

## 2021-10-05 ENCOUNTER — Inpatient Hospital Stay (HOSPITAL_COMMUNITY): Payer: Medicare HMO | Admitting: Certified Registered"

## 2021-10-05 ENCOUNTER — Inpatient Hospital Stay (HOSPITAL_COMMUNITY): Payer: Medicare HMO

## 2021-10-05 ENCOUNTER — Encounter (HOSPITAL_COMMUNITY)
Admission: EM | Disposition: A | Discharge status: Home / Self Care | Payer: Self-pay | Source: Home / Self Care | Attending: Family Medicine

## 2021-10-05 DIAGNOSIS — I10 Essential (primary) hypertension: Secondary | ICD-10-CM | POA: Diagnosis not present

## 2021-10-05 DIAGNOSIS — G4733 Obstructive sleep apnea (adult) (pediatric): Secondary | ICD-10-CM | POA: Diagnosis not present

## 2021-10-05 DIAGNOSIS — I341 Nonrheumatic mitral (valve) prolapse: Secondary | ICD-10-CM | POA: Diagnosis not present

## 2021-10-05 DIAGNOSIS — F32A Depression, unspecified: Secondary | ICD-10-CM | POA: Diagnosis not present

## 2021-10-05 DIAGNOSIS — S32010A Wedge compression fracture of first lumbar vertebra, initial encounter for closed fracture: Secondary | ICD-10-CM | POA: Diagnosis not present

## 2021-10-05 DIAGNOSIS — Z7401 Bed confinement status: Secondary | ICD-10-CM | POA: Diagnosis not present

## 2021-10-05 DIAGNOSIS — Z96642 Presence of left artificial hip joint: Secondary | ICD-10-CM | POA: Diagnosis present

## 2021-10-05 DIAGNOSIS — Z91041 Radiographic dye allergy status: Secondary | ICD-10-CM | POA: Diagnosis not present

## 2021-10-05 DIAGNOSIS — M4856XA Collapsed vertebra, not elsewhere classified, lumbar region, initial encounter for fracture: Secondary | ICD-10-CM | POA: Diagnosis not present

## 2021-10-05 DIAGNOSIS — Z9049 Acquired absence of other specified parts of digestive tract: Secondary | ICD-10-CM | POA: Diagnosis not present

## 2021-10-05 DIAGNOSIS — Z888 Allergy status to other drugs, medicaments and biological substances status: Secondary | ICD-10-CM | POA: Diagnosis not present

## 2021-10-05 DIAGNOSIS — M8008XA Age-related osteoporosis with current pathological fracture, vertebra(e), initial encounter for fracture: Secondary | ICD-10-CM | POA: Diagnosis not present

## 2021-10-05 DIAGNOSIS — Z9102 Food additives allergy status: Secondary | ICD-10-CM | POA: Diagnosis not present

## 2021-10-05 DIAGNOSIS — Z7982 Long term (current) use of aspirin: Secondary | ICD-10-CM | POA: Diagnosis not present

## 2021-10-05 DIAGNOSIS — I130 Hypertensive heart and chronic kidney disease with heart failure and stage 1 through stage 4 chronic kidney disease, or unspecified chronic kidney disease: Secondary | ICD-10-CM | POA: Diagnosis not present

## 2021-10-05 DIAGNOSIS — Z885 Allergy status to narcotic agent status: Secondary | ICD-10-CM | POA: Diagnosis not present

## 2021-10-05 DIAGNOSIS — N1831 Chronic kidney disease, stage 3a: Secondary | ICD-10-CM | POA: Diagnosis not present

## 2021-10-05 DIAGNOSIS — S32019A Unspecified fracture of first lumbar vertebra, initial encounter for closed fracture: Secondary | ICD-10-CM | POA: Diagnosis not present

## 2021-10-05 DIAGNOSIS — R69 Illness, unspecified: Secondary | ICD-10-CM | POA: Diagnosis not present

## 2021-10-05 DIAGNOSIS — I5032 Chronic diastolic (congestive) heart failure: Secondary | ICD-10-CM | POA: Diagnosis not present

## 2021-10-05 DIAGNOSIS — Z6829 Body mass index (BMI) 29.0-29.9, adult: Secondary | ICD-10-CM | POA: Diagnosis not present

## 2021-10-05 DIAGNOSIS — Z79899 Other long term (current) drug therapy: Secondary | ICD-10-CM | POA: Diagnosis not present

## 2021-10-05 DIAGNOSIS — Z20822 Contact with and (suspected) exposure to covid-19: Secondary | ICD-10-CM | POA: Diagnosis not present

## 2021-10-05 DIAGNOSIS — S0101XA Laceration without foreign body of scalp, initial encounter: Secondary | ICD-10-CM | POA: Diagnosis not present

## 2021-10-05 DIAGNOSIS — E876 Hypokalemia: Secondary | ICD-10-CM | POA: Diagnosis not present

## 2021-10-05 DIAGNOSIS — Z79891 Long term (current) use of opiate analgesic: Secondary | ICD-10-CM | POA: Diagnosis not present

## 2021-10-05 DIAGNOSIS — W01198A Fall on same level from slipping, tripping and stumbling with subsequent striking against other object, initial encounter: Secondary | ICD-10-CM | POA: Diagnosis not present

## 2021-10-05 DIAGNOSIS — E039 Hypothyroidism, unspecified: Secondary | ICD-10-CM | POA: Diagnosis not present

## 2021-10-05 DIAGNOSIS — Z881 Allergy status to other antibiotic agents status: Secondary | ICD-10-CM | POA: Diagnosis not present

## 2021-10-05 HISTORY — PX: KYPHOPLASTY: SHX5884

## 2021-10-05 LAB — BASIC METABOLIC PANEL
Anion gap: 9 (ref 5–15)
BUN: 12 mg/dL (ref 8–23)
CO2: 23 mmol/L (ref 22–32)
Calcium: 8.7 mg/dL — ABNORMAL LOW (ref 8.9–10.3)
Chloride: 105 mmol/L (ref 98–111)
Creatinine, Ser: 1.01 mg/dL — ABNORMAL HIGH (ref 0.44–1.00)
GFR, Estimated: 60 mL/min — ABNORMAL LOW (ref >60)
Glucose, Bld: 120 mg/dL — ABNORMAL HIGH (ref 70–99)
Potassium: 3.5 mmol/L (ref 3.5–5.1)
Sodium: 137 mmol/L (ref 135–145)

## 2021-10-05 LAB — SURGICAL PCR SCREEN
MRSA, PCR: NEGATIVE
Staphylococcus aureus: POSITIVE — AB

## 2021-10-05 LAB — CBC
HCT: 43.8 % (ref 36.0–46.0)
Hemoglobin: 14.7 g/dL (ref 12.0–15.0)
MCH: 30.2 pg (ref 26.0–34.0)
MCHC: 33.6 g/dL (ref 30.0–36.0)
MCV: 90.1 fL (ref 80.0–100.0)
Platelets: 211 10*3/uL (ref 150–400)
RBC: 4.86 MIL/uL (ref 3.87–5.11)
RDW: 14.8 % (ref 11.5–15.5)
WBC: 7.2 10*3/uL (ref 4.0–10.5)
nRBC: 0 % (ref 0.0–0.2)

## 2021-10-05 LAB — MAGNESIUM: Magnesium: 2.1 mg/dL (ref 1.7–2.4)

## 2021-10-05 LAB — TSH: TSH: 2.428 u[IU]/mL (ref 0.350–4.500)

## 2021-10-05 LAB — HIV ANTIBODY (ROUTINE TESTING W REFLEX): HIV Screen 4th Generation wRfx: NONREACTIVE

## 2021-10-05 SURGERY — KYPHOPLASTY
Anesthesia: General | Site: Spine Lumbar

## 2021-10-05 MED ORDER — 0.9 % SODIUM CHLORIDE (POUR BTL) OPTIME
TOPICAL | Status: DC | PRN
  Administered 2021-10-05: 1000 mL

## 2021-10-05 MED ORDER — VANCOMYCIN HCL IN DEXTROSE 1-5 GM/200ML-% IV SOLN
1000.0000 mg | Freq: Once | INTRAVENOUS | Status: AC
  Administered 2021-10-05: 1000 mg via INTRAVENOUS

## 2021-10-05 MED ORDER — MIDAZOLAM HCL 2 MG/2ML IJ SOLN
INTRAMUSCULAR | Status: AC
  Filled 2021-10-05: qty 2

## 2021-10-05 MED ORDER — SUGAMMADEX SODIUM 200 MG/2ML IV SOLN
INTRAVENOUS | Status: DC | PRN
  Administered 2021-10-05: 200 mg via INTRAVENOUS

## 2021-10-05 MED ORDER — CEFAZOLIN SODIUM-DEXTROSE 2-4 GM/100ML-% IV SOLN
INTRAVENOUS | Status: AC
  Filled 2021-10-05: qty 100

## 2021-10-05 MED ORDER — LIDOCAINE-EPINEPHRINE 1 %-1:100000 IJ SOLN
INTRAMUSCULAR | Status: AC
  Filled 2021-10-05: qty 1

## 2021-10-05 MED ORDER — DEXAMETHASONE SODIUM PHOSPHATE 10 MG/ML IJ SOLN
INTRAMUSCULAR | Status: DC | PRN
  Administered 2021-10-05: 10 mg via INTRAVENOUS

## 2021-10-05 MED ORDER — LACTATED RINGERS IV SOLN: INTRAVENOUS | Status: DC

## 2021-10-05 MED ORDER — FENTANYL CITRATE (PF) 100 MCG/2ML IJ SOLN
INTRAMUSCULAR | Status: DC | PRN
Start: 2021-10-05 — End: 2021-10-05
  Administered 2021-10-05: 100 ug via INTRAVENOUS

## 2021-10-05 MED ORDER — SUCCINYLCHOLINE CHLORIDE 200 MG/10ML IV SOSY
PREFILLED_SYRINGE | INTRAVENOUS | Status: DC | PRN
  Administered 2021-10-05: 120 mg via INTRAVENOUS

## 2021-10-05 MED ORDER — ORAL CARE MOUTH RINSE
15.0000 mL | Freq: Once | OROMUCOSAL | Status: AC
  Administered 2021-10-05: 15 mL via OROMUCOSAL

## 2021-10-05 MED ORDER — SUCCINYLCHOLINE CHLORIDE 200 MG/10ML IV SOSY
PREFILLED_SYRINGE | INTRAVENOUS | Status: AC
  Filled 2021-10-05: qty 10

## 2021-10-05 MED ORDER — LIDOCAINE-EPINEPHRINE 1 %-1:100000 IJ SOLN
INTRAMUSCULAR | Status: DC | PRN
Start: 2021-10-05 — End: 2021-10-05
  Administered 2021-10-05: 10 mL

## 2021-10-05 MED ORDER — FENTANYL CITRATE (PF) 100 MCG/2ML IJ SOLN: 25.0000 ug | INTRAMUSCULAR | Status: DC | PRN

## 2021-10-05 MED ORDER — IOHEXOL 300 MG/ML  SOLN
INTRAMUSCULAR | Status: DC | PRN
Start: 2021-10-05 — End: 2021-10-05
  Administered 2021-10-05: 100 mL

## 2021-10-05 MED ORDER — ONDANSETRON HCL 4 MG/2ML IJ SOLN
INTRAMUSCULAR | Status: DC | PRN
  Administered 2021-10-05: 4 mg via INTRAVENOUS

## 2021-10-05 MED ORDER — MUPIROCIN 2 % EX OINT
TOPICAL_OINTMENT | CUTANEOUS | Status: AC
  Administered 2021-10-05: 1 via TOPICAL
  Filled 2021-10-05: qty 22

## 2021-10-05 MED ORDER — MUPIROCIN 2 % EX OINT: 1.0000 "application " | TOPICAL_OINTMENT | Freq: Once | CUTANEOUS | Status: AC

## 2021-10-05 MED ORDER — ROCURONIUM BROMIDE 10 MG/ML (PF) SYRINGE
PREFILLED_SYRINGE | INTRAVENOUS | Status: DC | PRN
Start: 2021-10-05 — End: 2021-10-05
  Administered 2021-10-05: 30 mg via INTRAVENOUS

## 2021-10-05 MED ORDER — PROPOFOL 10 MG/ML IV BOLUS
INTRAVENOUS | Status: DC | PRN
  Administered 2021-10-05: 10 mg via INTRAVENOUS
  Administered 2021-10-05: 20 mg via INTRAVENOUS
  Administered 2021-10-05: 160 mg via INTRAVENOUS

## 2021-10-05 MED ORDER — VANCOMYCIN HCL IN DEXTROSE 1-5 GM/200ML-% IV SOLN
INTRAVENOUS | Status: AC
  Filled 2021-10-05: qty 200

## 2021-10-05 MED ORDER — FENTANYL CITRATE (PF) 250 MCG/5ML IJ SOLN
INTRAMUSCULAR | Status: AC
  Filled 2021-10-05: qty 5

## 2021-10-05 MED ORDER — LIDOCAINE 2% (20 MG/ML) 5 ML SYRINGE
INTRAMUSCULAR | Status: DC | PRN
  Administered 2021-10-05: 75 mg via INTRAVENOUS

## 2021-10-05 SURGICAL SUPPLY — 44 items
ADH SKN CLS APL DERMABOND .7 (GAUZE/BANDAGES/DRESSINGS) ×1
BAG COUNTER SPONGE SURGICOUNT (BAG) ×2 IMPLANT
BAG SPNG CNTER NS LX DISP (BAG) ×1
BLADE CLIPPER SURG (BLADE) IMPLANT
BLADE SURG 15 STRL LF DISP TIS (BLADE) ×1 IMPLANT
BLADE SURG 15 STRL SS (BLADE) ×2
CANISTER SUCT 3000ML PPV (MISCELLANEOUS) ×2 IMPLANT
CEMENT KYPHON CX01A KIT/MIXER (Cement) ×1 IMPLANT
DECANTER SPIKE VIAL GLASS SM (MISCELLANEOUS) ×2 IMPLANT
DERMABOND ADVANCED (GAUZE/BANDAGES/DRESSINGS) ×1
DERMABOND ADVANCED .7 DNX12 (GAUZE/BANDAGES/DRESSINGS) ×1 IMPLANT
DRAPE C-ARM 42X72 X-RAY (DRAPES) ×4 IMPLANT
DRAPE LAPAROTOMY 100X72X124 (DRAPES) ×2 IMPLANT
DRAPE SURG 17X23 STRL (DRAPES) ×2 IMPLANT
DRAPE WARM FLUID 44X44 (DRAPES) ×2 IMPLANT
DURAPREP 26ML APPLICATOR (WOUND CARE) ×2 IMPLANT
GAUZE 4X4 16PLY ~~LOC~~+RFID DBL (SPONGE) ×1 IMPLANT
GAUZE SPONGE 4X4 12PLY STRL (GAUZE/BANDAGES/DRESSINGS) IMPLANT
GLOVE EXAM NITRILE LRG STRL (GLOVE) IMPLANT
GLOVE EXAM NITRILE XL STR (GLOVE) IMPLANT
GLOVE EXAM NITRILE XS STR PU (GLOVE) IMPLANT
GLOVE SURG LTX SZ7.5 (GLOVE) ×2 IMPLANT
GLOVE SURG UNDER POLY LF SZ7.5 (GLOVE) ×2 IMPLANT
GOWN STRL REUS W/ TWL LRG LVL3 (GOWN DISPOSABLE) ×2 IMPLANT
GOWN STRL REUS W/ TWL XL LVL3 (GOWN DISPOSABLE) IMPLANT
GOWN STRL REUS W/TWL 2XL LVL3 (GOWN DISPOSABLE) IMPLANT
GOWN STRL REUS W/TWL LRG LVL3 (GOWN DISPOSABLE) ×4
GOWN STRL REUS W/TWL XL LVL3 (GOWN DISPOSABLE)
KIT BASIN OR (CUSTOM PROCEDURE TRAY) ×2 IMPLANT
KIT TURNOVER KIT B (KITS) ×2 IMPLANT
NDL SPNL 18GX3.5 QUINCKE PK (NEEDLE) ×1 IMPLANT
NEEDLE HYPO 22GX1.5 SAFETY (NEEDLE) ×2 IMPLANT
NEEDLE SPNL 18GX3.5 QUINCKE PK (NEEDLE) ×2 IMPLANT
NS IRRIG 1000ML POUR BTL (IV SOLUTION) ×2 IMPLANT
PACK BASIC III (CUSTOM PROCEDURE TRAY) ×2
PACK SRG BSC III STRL LF ECLPS (CUSTOM PROCEDURE TRAY) ×1 IMPLANT
PAD ARMBOARD 7.5X6 YLW CONV (MISCELLANEOUS) ×6 IMPLANT
SPONGE T-LAP 4X18 ~~LOC~~+RFID (SPONGE) IMPLANT
SUT MNCRL AB 3-0 PS2 18 (SUTURE) IMPLANT
SUT VIC AB 3-0 SH 8-18 (SUTURE) IMPLANT
TOWEL GREEN STERILE (TOWEL DISPOSABLE) ×2 IMPLANT
TOWEL GREEN STERILE FF (TOWEL DISPOSABLE) ×2 IMPLANT
TRAY KYPHOPAK 20/3 ONESTEP 1ST (MISCELLANEOUS) ×1 IMPLANT
WATER STERILE IRR 1000ML POUR (IV SOLUTION) ×2 IMPLANT

## 2021-10-05 NOTE — Anesthesia Postprocedure Evaluation (Signed)
Anesthesia Post Note  Patient: Rebecca Steele  Procedure(s) Performed: LUMBAR ONE KYPHOPLASTY (Spine Lumbar)     Patient location during evaluation: PACU Anesthesia Type: General Level of consciousness: awake Pain management: pain level controlled Vital Signs Assessment: post-procedure vital signs reviewed and stable Cardiovascular status: stable Postop Assessment: no apparent nausea or vomiting Anesthetic complications: no   No notable events documented.  Last Vitals:  Vitals:   10/05/21 1615 10/05/21 1630  BP: (!) 171/78 131/64  Pulse: 87 82  Resp: (!) 50 (!) 21  Temp:    SpO2: 100% 92%    Last Pain:  Vitals:   10/05/21 1414  TempSrc:   PainSc: 4                  Symia Herdt

## 2021-10-05 NOTE — Progress Notes (Signed)
OT Cancellation Note  Patient Details Name: Rebecca Steele MRN: [id number] DOB: [birth year]   Cancelled Treatment:    Reason Eval/Treat Not Completed: Patient not medically ready, pt going to OR for kyphoplasty today. Will follow up for OT eval as appropriate.  Lynnda Child, OTD, OTR/L Acute Rehab [phone]   Kaylyn Lim 10/05/2021, 8:39 AM

## 2021-10-05 NOTE — Evaluation (Addendum)
Physical Therapy Evaluation Patient Details Name: Rebecca Steele MRN: [id number] DOB: [birth year] Today's Date: 10/05/2021  History of Present Illness  Patient is a 71 y/o female who presents with L1 compression fx s/p fall. PMH includes HTN, mitral valve prolapse, renal disease.  Clinical Impression  Patient presents with pain s/p above injury. Pt independent PTA and has support of spouse at home. Today, mobility limited due to pain and nausea. Max A needed for bed mobility and Mod A for sitting EOB. Limited tolerance EOB due to pain. MD arrived post mobility and pt/MD planning for kyphoplasty later today. Will follow up post procedure to assess for DME needs and follow up services to prepare pt for d/c home. Education re: AROM BLEs, ankle pumps, QS etc. Anticipate pt will do well with better pain control as she was completely independence at baseline.       Recommendations for follow up therapy are one component of a multi-disciplinary discharge planning process, led by the attending physician.  Recommendations may be updated based on patient status, additional functional criteria and insurance authorization.  Follow Up Recommendations Other (comment) (will reassess post procedure, will likely do well when pain is controlled)    Assistance Recommended at Discharge Intermittent Supervision/Assistance  Patient can return home with the following  Other (comment) (reassess pending procedure)    Equipment Recommendations Rolling walker (2 wheels) (pending reassessment/procedure)  Recommendations for Other Services       Functional Status Assessment Patient has had a recent decline in their functional status and demonstrates the ability to make significant improvements in function in a reasonable and predictable amount of time.     Precautions / Restrictions Precautions Precautions: Fall Required Braces or Orthoses: Spinal Brace Spinal Brace: Thoracolumbosacral  orthotic Restrictions Weight Bearing Restrictions: No      Mobility  Bed Mobility Overal bed mobility: Needs Assistance Bed Mobility: Rolling;Sidelying to Sit;Sit to Sidelying Rolling: Max assist Sidelying to sit: Max assist;HOB elevated     Sit to sidelying: Mod assist General bed mobility comments: Step by step cues for log roll technique, heavy use of rail, assist with LEs, trunk and scooting bottomt o EOB. Assist to bring Les into bed to return to supine.    Transfers                   General transfer comment: Unable to attempt, crying in pain, nausea, shaking.    Ambulation/Gait                  Stairs            Wheelchair Mobility    Modified Rankin (Stroke Patients Only)       Balance Overall balance assessment: Needs assistance Sitting-balance support: Feet supported;Bilateral upper extremity supported Sitting balance-Leahy Scale: Poor Sitting balance - Comments: Requires mod A for static sitting balance with mild posterior lean and BUE support due to pain. Postural control: Posterior lean                                   Pertinent Vitals/Pain Pain Assessment: Faces Pain Score: 8  Faces Pain Scale: Hurts worst Pain Location: back with movement Pain Descriptors / Indicators: Crying;Discomfort;Moaning Pain Intervention(s): Premedicated before session;Monitored during session;Limited activity within patient's tolerance;Repositioned;Utilized relaxation techniques    Home Living Family/patient expects to be discharged to:: Private residence Living Arrangements: Spouse/significant other Available Help at Discharge: Family Type of Home:  House Home Access: Level entry       Home Layout: One level Home Equipment: Other (comment) Additional Comments: "cutsy shower chair", not one i can push up from per pt, uses it for shaving    Prior Function Prior Level of Function : Independent/Modified Independent              Mobility Comments: INdependent, driving, consulting work for physician, Mudlogger of music for [employer] ADLs Comments: Independent     Hand Dominance   Dominant Hand: Right    Extremity/Trunk Assessment   Upper Extremity Assessment Upper Extremity Assessment: Defer to OT evaluation    Lower Extremity Assessment Lower Extremity Assessment: Generalized weakness    Cervical / Trunk Assessment Cervical / Trunk Assessment: Other exceptions Cervical / Trunk Exceptions: back fx  Communication   Communication: No difficulties  Cognition Arousal/Alertness: Awake/alert Behavior During Therapy: WFL for tasks assessed/performed Overall Cognitive Status: Within Functional Limits for tasks assessed                                          General Comments General comments (skin integrity, edema, etc.): Spouse present during session. MD present at end of session.    Exercises     Assessment/Plan    PT Assessment Patient needs continued PT services  PT Problem List Decreased mobility;Decreased knowledge of precautions;Decreased activity tolerance;Pain       PT Treatment Interventions Therapeutic activities;DME instruction;Gait training;Therapeutic exercise;Patient/family education;Balance training;Functional mobility training;Modalities    PT Goals (Current goals can be found in the Care Plan section)  Acute Rehab PT Goals Patient Stated Goal: reduce pain, have kyphoplasty PT Goal Formulation: With patient Time For Goal Achievement: 10/19/21 Potential to Achieve Goals: Good    Frequency Min 4X/week     Co-evaluation               AM-PAC PT "6 Clicks" Mobility  Outcome Measure Help needed turning from your back to your side while in a flat bed without using bedrails?: Total Help needed moving from lying on your back to sitting on the side of a flat bed without using bedrails?: Total Help needed moving to and from a bed to a chair (including a  wheelchair)?: Total Help needed standing up from a chair using your arms (e.g., wheelchair or bedside chair)?: Total Help needed to walk in hospital room?: Total Help needed climbing 3-5 steps with a railing? : Total 6 Click Score: 6    End of Session   Activity Tolerance: Patient limited by pain Patient left: in bed;with call bell/phone within reach;with bed alarm set;with family/visitor present Nurse Communication: Mobility status PT Visit Diagnosis: Pain Pain - part of body:  (back)    Time: 6720-9470 PT Time Calculation (min) (ACUTE ONLY): 29 min   Charges:   PT Evaluation $PT Eval Moderate Complexity: 1 Mod PT Treatments $Therapeutic Activity: 8-22 mins        Marisa Severin, PT, DPT Acute Rehabilitation Services Pager [phone] Office [phone]     Marguarite Arbour A Sabra Heck 10/05/2021, 8:38 AM

## 2021-10-05 NOTE — Progress Notes (Signed)
When rolled patient staff realized she had incontinence of bladder, patient states she normally knows when she has to urinate. Bladder scanned patient for 431. Called anesthesiologist and they referred me to surgeon/ Dr. Joaquim Nam said as long that if she is having discomfort to In & out cath patient. Patient not feeling any discomfort or pressure at this time, put a purewick in place and reported off to [employer] to monitor.

## 2021-10-05 NOTE — TOC CAGE-AID Note (Signed)
Transition of Care [facility]) - CAGE-AID Screening   Patient Details  Name: Rebecca Steele MRN: [id number] Date of Birth: 1951-03-18  [facility] CM/SW Contact:    Gaetano Hawthorne Tarpley-Carter, [place] Phone Number: 10/05/2021, 8:46 AM   Clinical Narrative: Pt participated in [place].  Pt stated she does not use substance or ETOH.  Pt was not offered resources, due to no usage of substance or ETOH.    Briggitte Boline Tarpley-Carter, MSW, LCSW-A Pronouns:  She/Her/Hers  Transitions of Care Clinical Social Worker Direct Number:  [phone] [email or web address]  CAGE-AID Screening:    Have You Ever Felt You Ought to Cut Down on Your Drinking or Drug Use?: No Have People Annoyed You By [employer] Your Drinking Or Drug Use?: No Have You Felt Bad Or Guilty About Your Drinking Or Drug Use?: No Have You Ever Had a Drink or Used Drugs First Thing In The Morning to Steady Your Nerves or to Get Rid of a Hangover?: No CAGE-AID Score: 0  Substance Abuse Education Offered: No

## 2021-10-05 NOTE — Progress Notes (Signed)
Neurosurgery Service Post-operative progress note  Assessment & Plan: 71 y.o. woman s/p L1 kyphoplasty, seen in PACU, MAEx4 with good strength.  -okay to return to 6N -no brace needed -activity as tolerated -advance diet as tolerated -okay for DVT chemoprophylaxis 1/5, but if she's ambulatory tomorrow morning then okay for discharge home  Judith Part  10/05/21 5:23 PM

## 2021-10-05 NOTE — Op Note (Addendum)
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 A CT of the lumbar spine showed an L1 compression fracture. Despite bracing and intravenous narcotics, her pain was too severe for ambulation and she was bedbound. I therefore recommended an L1 kyphoplastyThis was discussed with the patient as well as risks, benefits, and alternatives and wished to proceed.   OPERATIVE DETAIL: The patient was taken to the operating room, a formal time out was performed with two patient identifiers, anesthesia was induced by the anesthesia team, and the patient was placed on the OR table in the prone position. Fluoroscopy was used to localize the operative level and bi-plane fluoroscopy was used for the entirety of the procedure. The area was then prepped and draped in a sterile fashion.   Using fluoroscopic guidance, entry sites were identified and a small stab incision was made large enough to accommodate the needles. The needles were then inserted percutaneously into the pedicles of L1 bilaterally and into the vertebral body. The inner cannula was removed and balloons were inserted and expanded using contrast for visualization. The balloons were then deflated and removed and cement was inserted through the cannula with fluoroscopic monitoring for any leakage out of the vertebral body with no evidence of leakage and good filling of the defect. The inner cannulas were then reinserted and the needles were removed without any reflux of cement. All instrument and sponge counts were correct, dermabond was placed over the puncture sites bilaterally. The patient  was then returned to anesthesia for emergence. No apparent complications at the completion of the procedure.   EBL:  23mL   DRAINS: none   SPECIMENS: none   Judith Part, MD 10/05/21 3:48 PM

## 2021-10-05 NOTE — Progress Notes (Signed)
Pt was bladder scanned with a reading of 579 ml. Pt was able to get up and voided on the [facility] with 600 ml dark amber urine.

## 2021-10-05 NOTE — Transfer of Care (Signed)
Immediate Anesthesia Transfer of Care Note  Patient: Rebecca Steele  Procedure(s) Performed: LUMBAR ONE KYPHOPLASTY (Spine Lumbar)  Patient Location: PACU  Anesthesia Type:General  Level of Consciousness: drowsy and patient cooperative  Airway & Oxygen Therapy: Patient Spontanous Breathing and non-rebreather face mask  Post-op Assessment: Report given to RN, Post -op Vital signs reviewed and stable and Patient moving all extremities  Post vital signs: Reviewed and stable  Last Vitals:  Vitals Value Taken Time  BP 174/79 10/05/21 1558  Temp    Pulse 106 10/05/21 1604  Resp 28 10/05/21 1604  SpO2 100 % 10/05/21 1604  Vitals shown include unvalidated device data.  Last Pain:  Vitals:   10/05/21 1414  TempSrc:   PainSc: 4       Patients Stated Pain Goal: 0 (31/12/16 2446)  Complications: No notable events documented.

## 2021-10-05 NOTE — Anesthesia Procedure Notes (Signed)
Procedure Name: Intubation Date/Time: 10/05/2021 3:02 PM Performed by: Moshe Salisbury, CRNA Pre-anesthesia Checklist: Patient identified, Emergency Drugs available, Suction available and Patient being monitored Patient Re-evaluated:Patient Re-evaluated prior to induction Oxygen Delivery Method: Circle System Utilized Preoxygenation: Pre-oxygenation with 100% oxygen Induction Type: IV induction Ventilation: Mask ventilation without difficulty Laryngoscope Size: Mac and 3 Grade View: Grade II Tube type: Oral Tube size: 7.0 mm Number of attempts: 1 Airway Equipment and Method: Stylet Placement Confirmation: ETT inserted through vocal cords under direct vision, positive ETCO2 and breath sounds checked- equal and bilateral Secured at: 20 cm Tube secured with: Tape Dental Injury: Teeth and Oropharynx as per pre-operative assessment

## 2021-10-05 NOTE — Progress Notes (Signed)
Pt tried to get up with PT and couldnt sit up without severe pain, still non ambulatory obviously despite Rx and brace. Will take to the OR for kyphoplasty today, I made her NPO.

## 2021-10-05 NOTE — Plan of Care (Signed)

## 2021-10-05 NOTE — Progress Notes (Signed)
Pt was bladder scanned again at [place] with a reading of 448 mL. Pt does not have any c/o pain or discomfort at this time. Pt has been made aware that she will monitored throughout the night. Dr. Einar Grad has been notified at this time and this information has been passed on to the oncoming nurse.

## 2021-10-05 NOTE — Care Management Obs Status (Signed)
[facility] NOTIFICATION   Patient Details  Name: Rebecca Steele MRN: [id number] Date of Birth: 08-Dec-1950   Medicare Observation Status Notification Given:  Yes    Angelita Ingles, RN 10/05/2021, 8:23 AM

## 2021-10-05 NOTE — Progress Notes (Signed)
PROGRESS NOTE    Rebecca Steele  ZOX:[id number] DOB: 28-Oct-1950 DOA: 10/04/2021 PCP: Fanny Bien, MD   Brief Narrative:  HPI: Rebecca Steele is a 71 y.o. female with medical history significant of hypertension, mitral valve prolapse,kidney stones, OSA not on CPAP presents after having a fall at home with complaints of back pain.  She had fallen asleep last night on the couch and woke up around 2 AM that he still restroom.  She reported feeling dizzy prior to falling hitting her head on a countertop.  Patient did not report feeling like she lost consciousness. Following the fall she did complain of pain on the left side of her head, nausea, and lower back pain.  Pain in her lower back is severe especially with any kind of movement, but does not radiate.  Denies having any significant shortness of breath, cough, fevers, chills, saddle anesthesia, focal weakness, diarrhea, blood in stools, or recent sick contacts.   ED Course: Upon admission into the emergency department patient was noted to be afebrile with blood pressure 126/68 and 167/88, and all other vital signs maintained.  Labs significant for WBC 11.1, hemoglobin 15.1, potassium 3, BUN 14, creatinine 1.07, and troponin negative x2.  The scan of the head and cervical spine negative for any acute abnormality, but did note laceration of the left scalp.  Laceration was stapled by the ED provider.  Neurosurgery has been consulted and recommending applying a TLSO brace to follow-up with them in the outpatient setting.  However patient unable to ambulate due to pain.  She had been given morphine, Dilaudid IV, and potassium chloride 40 mEq.  Assessment & Plan:   Principal Problem:   Compression fracture of L1 lumbar vertebra (HCC) Active Problems:   Fall at home, initial encounter   Hypokalemia   Scalp laceration   Leukocytosis   Closed compression fracture of body of L1 vertebra (HCC)  L1 compression fracture secondary to fall:  Patient could not get up with the PT even with the TLSO brace.  Neurosurgery plans to do kyphoplasty today.  Management per them.   Leukocytosis: Acute.  WBC elevated 11.1.  Suspect secondary to the acute fracture.Resolved.  Scalp laceration: Patient noted to have a 7 cm laceration to the left scalp secondary to fall.  Patient received 8 staples by the ED provider. -Staples should be removed within 7 to 14 days.   Hypokalemia: Resolved.   Depression -Continue Zoloft   Chronic diastolic congestive heart failure: Appears euvolemic.  CKD stage IIIa: Stable.  DVT prophylaxis: enoxaparin (LOVENOX) injection 40 mg Start: 10/04/21 1600   Code Status: Full Code  Family Communication: Husband present at bedside.  Plan of care discussed with patient and her husband in length and he verbalized understanding and agreed with it.  Status is: Inpatient  Remains inpatient appropriate because: Needs kyphoplasty  Estimated body mass index is 29.76 kg/m as calculated from the following:   Height as of 07/18/21: 5\' 7"  (1.702 m).   Weight as of 07/18/21: 86.2 kg.     Nutritional Assessment: There is no height or weight on file to calculate BMI.. Seen by dietician.  I agree with the assessment and plan as outlined below: Nutrition Status:        .  Skin Assessment: I have examined the patient's skin and I agree with the wound assessment as performed by the wound care RN as outlined below:    Consultants:  Neurosurgery  Procedures:  None  Antimicrobials:  Anti-infectives (From admission, onward)    None          Subjective: Seen and examined.  She states that her pain is controlled as long as she is still.  Has been at the bedside.  No other complaint.  Objective: Vitals:   10/04/21 1512 10/04/21 1746 10/04/21 2129 10/05/21 0513  BP: (!) 173/81 138/76 126/74 136/69  Pulse: 76  60 75  Resp: 19  16 16   Temp: 98 F (36.7 C)  97.9 F (36.6 C) 98.2 F (36.8 C)  TempSrc:  Oral  Oral Oral  SpO2: 94%  96% 95%    Intake/Output Summary (Last 24 hours) at 10/05/2021 1402 Last data filed at 10/04/2021 1645 Gross per 24 hour  Intake 18.45 ml  Output --  Net 18.45 ml   There were no vitals filed for this visit.  Examination:  General exam: Appears calm and comfortable  Respiratory system: Clear to auscultation. Respiratory effort normal. Cardiovascular system: S1 & S2 heard, RRR. No JVD, murmurs, rubs, gallops or clicks. No pedal edema. Gastrointestinal system: Abdomen is nondistended, soft and nontender. No organomegaly or masses felt. Normal bowel sounds heard. Central nervous system: Alert and oriented. No focal neurological deficits. Extremities: Symmetric 5 x 5 power. Skin: No rashes, lesions or ulcers Psychiatry: Judgement and insight appear normal. Mood & affect appropriate.    Data Reviewed: I have personally reviewed following labs and imaging studies  CBC: Recent Labs  Lab 10/04/21 0611 10/05/21 0036  WBC 11.1* 7.2  NEUTROABS 9.0*  --   HGB 15.1* 14.7  HCT 45.7 43.8  MCV 87.2 90.1  PLT 239 540   Basic Metabolic Panel: Recent Labs  Lab 10/04/21 0611 10/05/21 0036  NA 139 137  K 3.0* 3.5  CL 105 105  CO2 21* 23  GLUCOSE 112* 120*  BUN 14 12  CREATININE 1.06* 1.01*  CALCIUM 9.4 8.7*  MG  --  2.1   GFR: CrCl cannot be calculated (Unknown ideal weight.). Liver Function Tests: Recent Labs  Lab 10/04/21 0611  AST 21  ALT 18  ALKPHOS 82  BILITOT 1.0  PROT 6.4*  ALBUMIN 3.8   No results for input(s): LIPASE, AMYLASE in the last 168 hours. No results for input(s): AMMONIA in the last 168 hours. Coagulation Profile: No results for input(s): INR, PROTIME in the last 168 hours. Cardiac Enzymes: No results for input(s): CKTOTAL, CKMB, CKMBINDEX, TROPONINI in the last 168 hours. BNP (last 3 results) No results for input(s): PROBNP in the last 8760 hours. HbA1C: No results for input(s): HGBA1C in the last 72 hours. CBG: No  results for input(s): GLUCAP in the last 168 hours. Lipid Profile: No results for input(s): CHOL, HDL, LDLCALC, TRIG, CHOLHDL, LDLDIRECT in the last 72 hours. Thyroid Function Tests: Recent Labs    10/05/21 0036  TSH 2.428   Anemia Panel: No results for input(s): VITAMINB12, FOLATE, FERRITIN, TIBC, IRON, RETICCTPCT in the last 72 hours. Sepsis Labs: No results for input(s): PROCALCITON, LATICACIDVEN in the last 168 hours.  Recent Results (from the past 240 hour(s))  Resp Panel by RT-PCR (Flu A&B, Covid) Nasopharyngeal Swab     Status: None   Collection Time: 10/04/21  3:31 PM   Specimen: Nasopharyngeal Swab; Nasopharyngeal(NP) swabs in vial transport medium  Result Value Ref Range Status   SARS Coronavirus 2 by RT PCR NEGATIVE NEGATIVE Final    Comment: (NOTE) SARS-CoV-2 target nucleic acids are NOT DETECTED.  The SARS-CoV-2 RNA is generally detectable in upper  respiratory specimens during the acute phase of infection. The lowest concentration of SARS-CoV-2 viral copies this assay can detect is 138 copies/mL. A negative result does not preclude SARS-Cov-2 infection and should not be used as the sole basis for treatment or other patient management decisions. A negative result may occur with  improper specimen collection/handling, submission of specimen other than nasopharyngeal swab, presence of viral mutation(s) within the areas targeted by this assay, and inadequate number of viral copies(<138 copies/mL). A negative result must be combined with clinical observations, patient history, and epidemiological information. The expected result is Negative.  Fact Sheet for Patients:  EntrepreneurPulse.com.au  Fact Sheet for Healthcare Providers:  IncredibleEmployment.be  This test is no t yet approved or cleared by the [place] FDA and  has been authorized for detection and/or diagnosis of SARS-CoV-2 by FDA under an Emergency Use  Authorization (EUA). This EUA will remain  in effect (meaning this test can be used) for the duration of the COVID-19 declaration under Section 564(b)(1) of the Act, 21 U.S.C.section 360bbb-3(b)(1), unless the authorization is terminated  or revoked sooner.       Influenza A by PCR NEGATIVE NEGATIVE Final   Influenza B by PCR NEGATIVE NEGATIVE Final    Comment: (NOTE) The Xpert Xpress SARS-CoV-2/FLU/RSV plus assay is intended as an aid in the diagnosis of influenza from Nasopharyngeal swab specimens and should not be used as a sole basis for treatment. Nasal washings and aspirates are unacceptable for Xpert Xpress SARS-CoV-2/FLU/RSV testing.  Fact Sheet for Patients: EntrepreneurPulse.com.au  Fact Sheet for Healthcare Providers: IncredibleEmployment.be  This test is not yet approved or cleared by the [place] FDA and has been authorized for detection and/or diagnosis of SARS-CoV-2 by FDA under an Emergency Use Authorization (EUA). This EUA will remain in effect (meaning this test can be used) for the duration of the COVID-19 declaration under Section 564(b)(1) of the Act, 21 U.S.C. section 360bbb-3(b)(1), unless the authorization is terminated or revoked.  Performed at [facility], [address], [place], [place] [address]       Radiology Studies: DG Thoracic Spine 2 View  Result Date: 10/04/2021 CLINICAL DATA:  Fall with back pain EXAM: THORACIC SPINE 2 VIEWS COMPARISON:  None. FINDINGS: No evidence of fracture or subluxation in the thoracic spine. Maintained posterior mediastinal fat planes. Artifact from EKG leads. L1 superior endplate concavity, reference radiography. IMPRESSION: Negative thoracic spine series. Electronically Signed   By: Jorje Guild M.D.   On: 10/04/2021 07:05   DG Lumbar Spine Complete  Result Date: 10/04/2021 CLINICAL DATA:  Fall with back pain EXAM: LUMBAR SPINE - COMPLETE 4+ VIEW COMPARISON:   Lumbar MRI 10/21/2020 FINDINGS: Superior endplate fracture of L1, right eccentric. This was not seen on lumbar MRI 10/21/2020. Mild degenerative changes for age. IMPRESSION: L1 superior endplate fracture since 1 year prior, likely acute in this setting. Height loss is mild. Electronically Signed   By: Jorje Guild M.D.   On: 10/04/2021 07:04   CT HEAD WO CONTRAST (5MM)  Result Date: 10/04/2021 CLINICAL DATA:  Fall with head laceration EXAM: CT HEAD WITHOUT CONTRAST CT CERVICAL SPINE WITHOUT CONTRAST TECHNIQUE: Multidetector CT imaging of the head and cervical spine was performed following the standard protocol without intravenous contrast. Multiplanar CT image reconstructions of the cervical spine were also generated. COMPARISON:  None. FINDINGS: CT HEAD FINDINGS Brain: No evidence of acute infarction, hemorrhage, hydrocephalus, extra-axial collection or mass lesion/mass effect. Vascular: No hyperdense vessel or unexpected calcification. Skull: Deep  left scalp laceration reaching bone. Negative for fracture or focal lesion. Sinuses/Orbits: No acute finding. CT CERVICAL SPINE FINDINGS Alignment: Normal. Skull base and vertebrae: No acute fracture. No primary bone lesion or focal pathologic process. Soft tissues and spinal canal: No prevertebral fluid or swelling. No visible canal hematoma. Retropharyngeal course of the cervical carotids. Disc levels: Ordinary degenerative changes. C6-7 ACDF with solid arthrodesis Upper chest: Negative IMPRESSION: 1. No evidence of acute intracranial or cervical spine injury. 2. Left scalp laceration without calvarial fracture. Electronically Signed   By: Jorje Guild M.D.   On: 10/04/2021 07:32   CT Cervical Spine Wo Contrast  Result Date: 10/04/2021 CLINICAL DATA:  Fall with head laceration EXAM: CT HEAD WITHOUT CONTRAST CT CERVICAL SPINE WITHOUT CONTRAST TECHNIQUE: Multidetector CT imaging of the head and cervical spine was performed following the standard protocol  without intravenous contrast. Multiplanar CT image reconstructions of the cervical spine were also generated. COMPARISON:  None. FINDINGS: CT HEAD FINDINGS Brain: No evidence of acute infarction, hemorrhage, hydrocephalus, extra-axial collection or mass lesion/mass effect. Vascular: No hyperdense vessel or unexpected calcification. Skull: Deep left scalp laceration reaching bone. Negative for fracture or focal lesion. Sinuses/Orbits: No acute finding. CT CERVICAL SPINE FINDINGS Alignment: Normal. Skull base and vertebrae: No acute fracture. No primary bone lesion or focal pathologic process. Soft tissues and spinal canal: No prevertebral fluid or swelling. No visible canal hematoma. Retropharyngeal course of the cervical carotids. Disc levels: Ordinary degenerative changes. C6-7 ACDF with solid arthrodesis Upper chest: Negative IMPRESSION: 1. No evidence of acute intracranial or cervical spine injury. 2. Left scalp laceration without calvarial fracture. Electronically Signed   By: Jorje Guild M.D.   On: 10/04/2021 07:32   CT LUMBAR SPINE WO CONTRAST  Result Date: 10/04/2021 CLINICAL DATA:  L1 fracture. EXAM: CT LUMBAR SPINE WITHOUT CONTRAST TECHNIQUE: Multidetector CT imaging of the lumbar spine was performed without intravenous contrast administration. Multiplanar CT image reconstructions were also generated. COMPARISON:  Lumbar spine radiographs 10/04/2021. Lumbar spine MRI 10/21/2020. FINDINGS: Segmentation: 5 lumbar type vertebrae. Alignment: Normal. Vertebrae: Recent L1 superior endplate compression fracture with 20% vertebral body height loss on the right. No posterior element fracture. Other vertebral body heights are preserved. No suspicious osseous lesion. Paraspinal and other soft tissues: Abdominal aortic atherosclerosis without aneurysm. Partially visualized left renal cysts. Cholecystectomy. Disc levels: Similar appearance of mild lumbar spondylosis compared to the prior MRI including a small  right foraminal disc protrusion at L3-4 resulting in mild right neural foraminal stenosis. No evidence of significant spinal stenosis. Moderate facet arthrosis at L4-5 and on the left at L5-S1. IMPRESSION: 1. L1 compression fracture with 20% height loss. 2. No additional fracture. 3. Aortic Atherosclerosis (ICD10-I70.0). Electronically Signed   By: Logan Bores M.D.   On: 10/04/2021 14:25    Scheduled Meds:  [MAR Hold] enoxaparin (LOVENOX) injection  40 mg Subcutaneous Q24H   [MAR Hold] loratadine  10 mg Oral Daily   mouth rinse  15 mL Mouth Rinse Once   [MAR Hold] montelukast  10 mg Oral QHS   mupirocin ointment       [MAR Hold] oxybutynin  5 mg Oral Daily   [MAR Hold] sertraline  100 mg Oral Daily   [MAR Hold] sodium chloride flush  3 mL Intravenous Q12H   [MAR Hold] traZODone  100 mg Oral QHS   Continuous Infusions:  sodium chloride 75 mL/hr at 10/05/21 0834   lactated ringers       LOS: 0 days   Time  spent: 30 minutes   Darliss Cheney, MD [facility]  10/05/2021, 2:02 PM  Please page via [place] and do not message via secure chat for anything urgent. Secure chat can be used for anything non urgent.  How to contact the [facility] Attending or Consulting provider [place] or covering provider during after hours [place], for this patient?  Check the care team in [facility] and look for a) attending/consulting TR[facility] provider listed and b) the [facility] team listed. Page or secure chat [phone]. Log into [email or web address] and use [place]'s universal password to access. If you do not have the password, please contact the hospital operator. Locate the [facility] provider you are looking for under [facility] and page to a number that you can be directly reached. If you still have difficulty reaching the provider, please page the [facility] (Director on Call) for the Hospitalists listed on amion for assistance.

## 2021-10-05 NOTE — Anesthesia Preprocedure Evaluation (Signed)
Anesthesia Evaluation  Patient identified by MRN, date of birth, ID band Patient awake    Reviewed: Allergy & Precautions, NPO status , Patient's Chart, lab work & pertinent test results  Airway Mallampati: II  TM Distance: >3 FB     Dental   Pulmonary sleep apnea ,    breath sounds clear to auscultation       Cardiovascular hypertension,  Rhythm:Regular Rate:Normal     Neuro/Psych PSYCHIATRIC DISORDERS    GI/Hepatic negative GI ROS, Neg liver ROS,   Endo/Other  Hypothyroidism   Renal/GU Renal disease     Musculoskeletal   Abdominal   Peds  Hematology   Anesthesia Other Findings   Reproductive/Obstetrics                             Anesthesia Physical Anesthesia Plan  ASA: 3  Anesthesia Plan: General   Post-op Pain Management:    Induction: Intravenous  PONV Risk Score and Plan: 3 and Ondansetron, Dexamethasone and Midazolam  Airway Management Planned: Oral ETT  Additional Equipment:   Intra-op Plan:   Post-operative Plan: Extubation in OR  Informed Consent: I have reviewed the patients History and Physical, chart, labs and discussed the procedure including the risks, benefits and alternatives for the proposed anesthesia with the patient or authorized representative who has indicated his/her understanding and acceptance.     Dental advisory given  Plan Discussed with: CRNA and Anesthesiologist  Anesthesia Plan Comments:         Anesthesia Quick Evaluation

## 2021-10-05 NOTE — Progress Notes (Signed)
Pt c/o itching on her back due to brace. Pt was given a  break from TLSO brace. Pt has not been able to get up due to severe back pain. Will continue to monitor pt.

## 2021-10-06 ENCOUNTER — Encounter (HOSPITAL_COMMUNITY): Payer: Self-pay | Admitting: Neurological Surgery

## 2021-10-06 DIAGNOSIS — S32010A Wedge compression fracture of first lumbar vertebra, initial encounter for closed fracture: Secondary | ICD-10-CM | POA: Diagnosis not present

## 2021-10-06 DIAGNOSIS — M1612 Unilateral primary osteoarthritis, left hip: Secondary | ICD-10-CM | POA: Diagnosis not present

## 2021-10-06 LAB — CBC WITH DIFFERENTIAL/PLATELET
Abs Immature Granulocytes: 0.04 K/uL (ref 0.00–0.07)
Basophils Absolute: 0 K/uL (ref 0.0–0.1)
Basophils Relative: 0 %
Eosinophils Absolute: 0 K/uL (ref 0.0–0.5)
Eosinophils Relative: 0 %
HCT: 40.2 % (ref 36.0–46.0)
Hemoglobin: 13.4 g/dL (ref 12.0–15.0)
Immature Granulocytes: 0 %
Lymphocytes Relative: 14 %
Lymphs Abs: 1.2 K/uL (ref 0.7–4.0)
MCH: 29.7 pg (ref 26.0–34.0)
MCHC: 33.3 g/dL (ref 30.0–36.0)
MCV: 89.1 fL (ref 80.0–100.0)
Monocytes Absolute: 0.8 K/uL (ref 0.1–1.0)
Monocytes Relative: 9 %
Neutro Abs: 6.8 K/uL (ref 1.7–7.7)
Neutrophils Relative %: 77 %
Platelets: 196 K/uL (ref 150–400)
RBC: 4.51 MIL/uL (ref 3.87–5.11)
RDW: 14.2 % (ref 11.5–15.5)
WBC: 8.9 K/uL (ref 4.0–10.5)
nRBC: 0 % (ref 0.0–0.2)

## 2021-10-06 LAB — BASIC METABOLIC PANEL
Anion gap: 7 (ref 5–15)
BUN: 8 mg/dL (ref 8–23)
CO2: 26 mmol/L (ref 22–32)
Calcium: 8.8 mg/dL — ABNORMAL LOW (ref 8.9–10.3)
Chloride: 107 mmol/L (ref 98–111)
Creatinine, Ser: 0.91 mg/dL (ref 0.44–1.00)
GFR, Estimated: >60 mL/min (ref >60)
Glucose, Bld: 108 mg/dL — ABNORMAL HIGH (ref 70–99)
Potassium: 3.1 mmol/L — ABNORMAL LOW (ref 3.5–5.1)
Sodium: 140 mmol/L (ref 135–145)

## 2021-10-06 LAB — MAGNESIUM: Magnesium: 1.9 mg/dL (ref 1.7–2.4)

## 2021-10-06 NOTE — TOC Transition Note (Signed)
Transition of Care [facility]) - CM/SW Discharge Note   Patient Details  Name: Rebecca Steele MRN: [id number] Date of Birth: Sep 04, 1951  [facility] CM/SW Contact:  Sharin Mons, RN Phone Number: 10/06/2021, 12:00 PM   Clinical Narrative:    Patient will DC to: home Anticipated DC date: 10/06/2020 Family notified: yes Transport by: car       - s/p L1 kyphoplasty on  01/03. PMH includes HTN, mitral valve prolapse, renal disease  Per MD patient ready for DC today . RN, patient, and  patient's husband  notified of DC. DME needs, shower chair and 3in1/bsc  noted. Referral made with [employer]. Equipment will be delivered to bedside prior to d/c. Post hospital f/u noted on AVS. Pt without Rx med concerns. Husband to provide transportation to home.  RNCM will sign off for now as intervention is no longer needed. Please consult [place] again if new needs arise.    Final next level of care: Home/Self Care Barriers to Discharge: No Barriers Identified   Patient Goals and CMS Choice     Choice offered to / list presented to : Patient  Discharge Placement                       Discharge Plan and Services                DME Arranged: 3-N-1, Other see comment Tacy Dura) DME Agency: [facility] Date DME Agency Contacted: 10/06/21   Representative spoke with at DME Agency: [place] Determinants of Health ([place]) Interventions     Readmission Risk Interventions No flowsheet data found.

## 2021-10-06 NOTE — Plan of Care (Signed)

## 2021-10-06 NOTE — Evaluation (Signed)
Occupational Therapy Evaluation Patient Details Name: Rebecca Steele MRN: [id number] DOB: March 31, 1951 Today's Date: 10/06/2021   History of Present Illness Patient is a 71 y/o female who presents with L1 compression fx s/p fall. Underwent L1 kyphoplasty on  01/03. PMH includes HTN, mitral valve prolapse, renal disease.   Clinical Impression   Pt independent at baseline with ADLs and functional mobility, currently set up -min A for ADLs, min A for bed mobility,and min guard for transfers. Pt denies pain, reports mild dizziness when sitting EOB that resolves quickly. Pt ambulates household distance for standing ADL with RW, able to ambulate around room and back to chair without RW, observed pt using wall/bed for occasional support. Pt presenting with problem list below, will continue to follow in the acute setting. Recommend safe d/c home with assistance.     Recommendations for follow up therapy are one component of a multi-disciplinary discharge planning process, led by the attending physician.  Recommendations may be updated based on patient status, additional functional criteria and insurance authorization.   Follow Up Recommendations  Follow physician's recommendations for discharge plan and follow up therapies    Assistance Recommended at Discharge Set up Supervision/Assistance  Patient can return home with the following A little help with bathing/dressing/bathroom;Help with stairs or ramp for entrance    Functional Status Assessment  Patient has had a recent decline in their functional status and demonstrates the ability to make significant improvements in function in a reasonable and predictable amount of time.  Equipment Recommendations  BSC/3in1;Other (comment) (BSC as shower seat)    Recommendations for Other Services PT consult     Precautions / Restrictions Precautions Precautions: Fall Spinal Brace: Other (comment) (no brace needed post surgery per updated MD  note) Restrictions Weight Bearing Restrictions: No      Mobility Bed Mobility Overal bed mobility: Needs Assistance Bed Mobility: Sidelying to Sit   Sidelying to sit: Min assist       General bed mobility comments: assist to pull trunk up to standing    Transfers Overall transfer level: Needs assistance Equipment used: Rolling walker (2 wheels) Transfers: Sit to/from Stand Sit to Stand: Min guard           General transfer comment: used RW initially,      Balance Overall balance assessment: Needs assistance Sitting-balance support: Feet supported;Bilateral upper extremity supported Sitting balance-Leahy Scale: Normal     Standing balance support: During functional activity Standing balance-Leahy Scale: Fair                             ADL either performed or assessed with clinical judgement   ADL Overall ADL's : Needs assistance/impaired Eating/Feeding: Independent;Sitting   Grooming: Oral care;Wash/dry face;Wash/dry hands;Standing;Min guard Grooming Details (indicate cue type and reason): completes standing grooming tasks at sink Upper Body Bathing: Minimal assistance;Sitting   Lower Body Bathing: Minimal assistance;Sit to/from stand   Upper Body Dressing : Set up;Sitting   Lower Body Dressing: Minimal assistance;Sit to/from stand Lower Body Dressing Details (indicate cue type and reason): simulated LB dressing, able to reach down to pull up socks sitting EOB Toilet Transfer: Min guard;Rolling walker (2 wheels);Ambulation;Regular Toilet   Toileting- Water quality scientist and Hygiene: Independent;Sit to/from stand Toileting - Clothing Manipulation Details (indicate cue type and reason): able to complete pericare and clothing mgmt during toileting     Functional mobility during ADLs: Min guard       Vision  Vision Assessment?: No apparent visual deficits     Perception     Praxis      Pertinent Vitals/Pain Pain Assessment:  No/denies pain     Hand Dominance     Extremity/Trunk Assessment Upper Extremity Assessment Upper Extremity Assessment: Overall WFL for tasks assessed   Lower Extremity Assessment Lower Extremity Assessment: Defer to PT evaluation       Communication Communication Communication: No difficulties   Cognition Arousal/Alertness: Awake/alert Behavior During Therapy: WFL for tasks assessed/performed Overall Cognitive Status: Within Functional Limits for tasks assessed                                       General Comments  Spouse present during session, pt reporting mild dizziness when sitting EOB, resolved quickly    Exercises     Shoulder Instructions      Home Living Family/patient expects to be discharged to:: Private residence Living Arrangements: Spouse/significant other Available Help at Discharge: Family Type of Home: House Home Access: Level entry     Home Layout: One level     Bathroom Shower/Tub: Occupational psychologist: Handicapped height     Home Equipment: Conservation officer, nature (2 wheels);Grab bars - tub/shower;Grab bars - toilet   Additional Comments: lives in [place] with spouse      Prior Functioning/Environment Prior Level of Function : Independent/Modified Independent               ADLs Comments: Independent        OT Problem List:        OT Treatment/Interventions: Self-care/ADL training;Therapeutic exercise;Energy conservation;Balance training;Patient/family education;Therapeutic activities    OT Goals(Current goals can be found in the care plan section) Acute Rehab OT Goals Patient Stated Goal: to go home OT Goal Formulation: With patient Time For Goal Achievement: 10/20/21 Potential to Achieve Goals: Good ADL Goals Pt Will Perform Upper Body Dressing: with modified independence;sitting Pt Will Perform Lower Body Dressing: with modified independence;sit to/from stand Pt Will Perform Tub/Shower Transfer: with  supervision;3 in 1  OT Frequency: Min 2X/week    Co-evaluation              AM-PAC OT "6 Clicks" Daily Activity     Outcome Measure Help from another person eating meals?: None Help from another person taking care of personal grooming?: None Help from another person toileting, which includes using toliet, bedpan, or urinal?: None Help from another person bathing (including washing, rinsing, drying)?: A Little Help from another person to put on and taking off regular upper body clothing?: A Little Help from another person to put on and taking off regular lower body clothing?: A Little 6 Click Score: 21   End of Session Equipment Utilized During Treatment: Gait belt;Rolling walker (2 wheels) Nurse Communication: Mobility status  Activity Tolerance: Patient tolerated treatment well Patient left: in chair;with call bell/phone within reach;with family/visitor present  OT Visit Diagnosis: Unsteadiness on feet (R26.81);Other abnormalities of gait and mobility (R26.89);Muscle weakness (generalized) (M62.81)                Time: 2633-3545 OT Time Calculation (min): 20 min Charges:  OT General Charges $OT Visit: 1 Visit OT Evaluation $OT Eval Low Complexity: 1 Low  Lynnda Child, OTD, OTR/L Acute Rehab [phone] - [place] 10/06/2021, 9:07 AM

## 2021-10-06 NOTE — Progress Notes (Signed)
Neurosurgery Service Progress Note  Subjective: No acute events overnight, dramatic improvement in pain post-op, she's elated, sitting up in bed with a smile, only complaint is that we couldn't do the kypho sooner, no new radicular symptoms   Objective: Vitals:   10/05/21 1745 10/05/21 1954 10/06/21 0506 10/06/21 0756  BP: 130/71 (!) 151/74 (!) 145/74 (!) 145/75  Pulse: 81 88 69 77  Resp: 18 16 17 18   Temp: 98.6 F (37 C) 98.5 F (36.9 C)  98.8 F (37.1 C)  TempSrc: Oral Oral  Oral  SpO2: 95% 95% 96% 96%  Weight:      Height:        Physical Exam: Strength 5/5 x4, SILTx4  Assessment & Plan: 71 y.o. woman s/p fall w/ L1 compression frx s/p L1 kypho with great improvement in pain.  -no brace needed -activity as tolerated -okay for discharge home from my perspective, will place my discharge instructions in [place]  10/06/21 8:47 AM

## 2021-10-06 NOTE — Discharge Summary (Signed)
Physician Discharge Summary  Rebecca Steele SJG:[id number] DOB: May 11, 1951 DOA: 10/04/2021  PCP: Rebecca Bien, MD  Admit date: 10/04/2021 Discharge date: 10/06/2021 30 Day Unplanned Readmission Risk Score    Flowsheet Row ED to Hosp-Admission (Current) from 10/04/2021 in [place]  30 Day Unplanned Readmission Risk Score (%) 13.98 Filed at 10/06/2021 0801       This score is the patient's risk of an unplanned readmission within 30 days of being discharged (0 -100%). The score is based on dignosis, age, lab data, medications, orders, and past utilization.   Low:  0-14.9   Medium: 15-21.9   High: 22-29.9   Extreme: 30 and above          Admitted From: Home Disposition: Home  Recommendations for Outpatient Follow-up:  Follow up with PCP in 1-2 weeks Please obtain BMP/CBC in one week Follow-up with neurosurgery/Dr. Venetia Steele, per their recommendations, please call the office and make an appointment. Please follow up with your PCP on the following pending results: Unresulted Labs (From admission, onward)     Start     Ordered   10/06/21 0818  CBC with Differential/Platelet  Once,   R       Question:  Specimen collection method  Answer:  Lab=Lab collect   10/06/21 0817   10/06/21 6546  Basic metabolic panel  Once,   R       Question:  Specimen collection method  Answer:  Lab=Lab collect   10/06/21 0817   10/06/21 0818  Magnesium  Once,   R       Question:  Specimen collection method  Answer:  Lab=Lab collect   10/06/21 0817   10/04/21 0612  Urinalysis, Routine w reflex microscopic  ONCE - STAT,   STAT        10/04/21 0611              Home Health: None Equipment/Devices: None  Discharge Condition: Stable CODE STATUS: Full code Diet recommendation: Cardiac  Subjective: Seen and examined this morning.  She is very happy that she is feeling a lot better with no pain at all.  She did very well with the PT as well and she is  wanting to go home.  Brief/Interim Summary: Rebecca Steele is a 70 y.o. female with medical history significant of hypertension, mitral valve prolapse,kidney stones, OSA not on CPAP presented after having a fall at home with complaints of back pain. She reported feeling dizzy prior to falling hitting her head on a countertop.  Patient did not report feeling like she lost consciousness.  The event happened at 2 AM in the morning.  Following the fall she did complain of pain on the left side of her head, nausea, and lower back pain.     Upon admission into the emergency department patient was noted to be afebrile with blood pressure 126/68 and 167/88, and all other vital signs maintained.  Labs significant for WBC 11.1, hemoglobin 15.1, potassium 3, BUN 14, creatinine 1.07, and troponin negative x2.  The scan of the head and cervical spine negative for any acute abnormality, but did note laceration of the left scalp.  Laceration was stapled by the ED provider.  CT lumbar spine showed L1 compression fraction with 20% height loss neurosurgery has been consulted and recommending applying a TLSO brace to follow-up with them in the outpatient setting.  However patient unable to ambulate due to pain despite of having TLSO brace so  neurosurgery did kyphoplasty on her on 10/05/2020, postprocedure, patient had significant improvement, she worked with PT and did not feel any pain even without TLSO brace and she has been cleared for discharge from neurosurgery perspective with follow-up with neurosurgery and PCP.  Leukocytosis: Acute.  WBC elevated 11.1.  Suspect secondary to the acute fracture.Resolved.   Scalp laceration: Patient noted to have a 7 cm laceration to the left scalp secondary to fall.  Patient received 8 staples by the ED provider. -Staples should be removed within 7 to 14 days by PCP.   Hypokalemia: Resolved.   Depression -Continue Zoloft   Chronic diastolic congestive heart failure: Appears  euvolemic.   CKD stage IIIa: Stable.  Discharge Diagnoses:  Principal Problem:   Compression fracture of L1 lumbar vertebra (HCC) Active Problems:   Fall at home, initial encounter   Hypokalemia   Scalp laceration   Leukocytosis   Closed compression fracture of body of L1 vertebra (HCC)    Discharge Instructions   Allergies as of 10/06/2021       Reactions   Gadolinium Derivatives Anaphylaxis, Shortness Of Breath   Pt was given 69ml multihance for MRI. After about 34min30 seconds she squeezed the ball saying she felt like her throat was closing up. Exam was DC'd and radiologist gave 75mg  benadryl IV.    Other    Pt states she was given Gabapentin and Fentanyl for anesthesia during recent surgery and was unable to wake up for several hours.  Pt states she does not want to have sedation with those medications in the future.   Valium Anaphylaxis   Doxycycline Swelling   Facial swelling   Fentanyl Other (See Comments)   Slept the whole time for few days   Iohexol Itching    Desc: Pt. stated she had iv contrast around 25 yrs ago and had itching on her neck.  she took benadryl and it went away.  The rad recommended premeds and be done tomorrow but the pt.did not want to do that so we did her w/o iv contrast today., Onset Date: [id number]   Macrodantin Nausea And Vomiting   Red Dye Swelling   Facial swelling (cannot take pink or red tablets or capsules)   Rocephin [ceftriaxone] Other (See Comments)   Joint aches   Zithromax [azithromycin] Other (See Comments)   Weakness, cold/clammy, legs trembling        Medication List     TAKE these medications    albuterol 108 (90 Base) MCG/ACT inhaler Commonly known as: VENTOLIN HFA Inhale 1-2 puffs into the lungs every 6 (six) hours as needed for wheezing or shortness of breath.   aspirin EC 325 MG tablet Take 1 tablet (325 mg total) by mouth 2 (two) times daily after a meal. Take x 1 month post op to decrease risk of blood clots.    butalbital-acetaminophen-caffeine 50-325-40 MG tablet Commonly known as: FIORICET Take 1 tablet by mouth 2 (two) times daily as needed for headache or migraine.   loratadine 10 MG tablet Commonly known as: CLARITIN Take 10 mg by mouth daily.   montelukast 10 MG tablet Commonly known as: SINGULAIR Take 10 mg by mouth at bedtime.   oxybutynin 5 MG 24 hr tablet Commonly known as: DITROPAN-XL Take 5 mg by mouth daily.   oxyCODONE-acetaminophen 5-325 MG tablet Commonly known as: PERCOCET/ROXICET Take 1-2 tablets by mouth every 6 (six) hours as needed for severe pain.   sertraline 100 MG tablet Commonly known as: ZOLOFT Take 100  mg by mouth daily.   tiZANidine 2 MG tablet Commonly known as: ZANAFLEX Take 1 tablet (2 mg total) by mouth every 8 (eight) hours as needed for muscle spasms.   traZODone 100 MG tablet Commonly known as: DESYREL Take 100 mg by mouth at bedtime.        Follow-up Information     Judith Part, MD. Call .   Specialty: Neurosurgery Why: Call and set up a follow up appointment regarding todays visit. Contact information: [place] [address] [phone]         Rebecca Bien, MD .   Specialty: Family Medicine Why: As needed Contact information: [address] [place] [address] [place] .   Specialty: Emergency Medicine Why: If symptoms worsen Contact information: [id number] [address] [place]        Rebecca Bien, MD Follow up in 1 week(s).   Specialty: Family Medicine Contact information: [address] [place] [address] [phone]                Allergies  Allergen Reactions   Gadolinium Derivatives Anaphylaxis and Shortness Of Breath    Pt was given 63ml multihance for MRI. After about 80min30 seconds she squeezed the ball saying she felt like her throat  was closing up. Exam was DC'd and radiologist gave 75mg  benadryl IV.    Other     Pt states she was given Gabapentin and Fentanyl for anesthesia during recent surgery and was unable to wake up for several hours.  Pt states she does not want to have sedation with those medications in the future.   Valium Anaphylaxis   Doxycycline Swelling    Facial swelling   Fentanyl Other (See Comments)    Slept the whole time for few days   Iohexol Itching     Desc: Pt. stated she had iv contrast around 25 yrs ago and had itching on her neck.  she took benadryl and it went away.  The rad recommended premeds and be done tomorrow but the pt.did not want to do that so we did her w/o iv contrast today., Onset Date: [id number]    Macrodantin Nausea And Vomiting   Red Dye Swelling    Facial swelling (cannot take pink or red tablets or capsules)   Rocephin [Ceftriaxone] Other (See Comments)    Joint aches   Zithromax [Azithromycin] Other (See Comments)    Weakness, cold/clammy, legs trembling    Consultations: Neurosurgery   Procedures/Studies: DG Thoracic Spine 2 View  Result Date: 10/04/2021 CLINICAL DATA:  Fall with back pain EXAM: THORACIC SPINE 2 VIEWS COMPARISON:  None. FINDINGS: No evidence of fracture or subluxation in the thoracic spine. Maintained posterior mediastinal fat planes. Artifact from EKG leads. L1 superior endplate concavity, reference radiography. IMPRESSION: Negative thoracic spine series. Electronically Signed   By: Jorje Guild M.D.   On: 10/04/2021 07:05   DG THORACOLUMABAR SPINE  Result Date: 10/05/2021 CLINICAL DATA:  Fracture L1 EXAM: THORACOLUMBAR SPINE 1V COMPARISON:  10/04/2021 FINDINGS: Fluoroscopic images show evidence of vertebroplasty in the L1 vertebra. Fluoroscopic time was 105 seconds. Radiation dose is 49.9 mGy. IMPRESSION: Fluoroscopic assistance was provided for vertebroplasty in L1 vertebra. Electronically Signed   By: Elmer Picker M.D.   On: 10/05/2021 16:04    DG Lumbar Spine Complete  Result Date: 10/04/2021 CLINICAL DATA:  Fall with  back pain EXAM: LUMBAR SPINE - COMPLETE 4+ VIEW COMPARISON:  Lumbar MRI 10/21/2020 FINDINGS: Superior endplate fracture of L1, right eccentric. This was not seen on lumbar MRI 10/21/2020. Mild degenerative changes for age. IMPRESSION: L1 superior endplate fracture since 1 year prior, likely acute in this setting. Height loss is mild. Electronically Signed   By: Jorje Guild M.D.   On: 10/04/2021 07:04   CT HEAD WO CONTRAST (5MM)  Result Date: 10/04/2021 CLINICAL DATA:  Fall with head laceration EXAM: CT HEAD WITHOUT CONTRAST CT CERVICAL SPINE WITHOUT CONTRAST TECHNIQUE: Multidetector CT imaging of the head and cervical spine was performed following the standard protocol without intravenous contrast. Multiplanar CT image reconstructions of the cervical spine were also generated. COMPARISON:  None. FINDINGS: CT HEAD FINDINGS Brain: No evidence of acute infarction, hemorrhage, hydrocephalus, extra-axial collection or mass lesion/mass effect. Vascular: No hyperdense vessel or unexpected calcification. Skull: Deep left scalp laceration reaching bone. Negative for fracture or focal lesion. Sinuses/Orbits: No acute finding. CT CERVICAL SPINE FINDINGS Alignment: Normal. Skull base and vertebrae: No acute fracture. No primary bone lesion or focal pathologic process. Soft tissues and spinal canal: No prevertebral fluid or swelling. No visible canal hematoma. Retropharyngeal course of the cervical carotids. Disc levels: Ordinary degenerative changes. C6-7 ACDF with solid arthrodesis Upper chest: Negative IMPRESSION: 1. No evidence of acute intracranial or cervical spine injury. 2. Left scalp laceration without calvarial fracture. Electronically Signed   By: Jorje Guild M.D.   On: 10/04/2021 07:32   CT Cervical Spine Wo Contrast  Result Date: 10/04/2021 CLINICAL DATA:  Fall with head laceration EXAM: CT HEAD WITHOUT CONTRAST CT  CERVICAL SPINE WITHOUT CONTRAST TECHNIQUE: Multidetector CT imaging of the head and cervical spine was performed following the standard protocol without intravenous contrast. Multiplanar CT image reconstructions of the cervical spine were also generated. COMPARISON:  None. FINDINGS: CT HEAD FINDINGS Brain: No evidence of acute infarction, hemorrhage, hydrocephalus, extra-axial collection or mass lesion/mass effect. Vascular: No hyperdense vessel or unexpected calcification. Skull: Deep left scalp laceration reaching bone. Negative for fracture or focal lesion. Sinuses/Orbits: No acute finding. CT CERVICAL SPINE FINDINGS Alignment: Normal. Skull base and vertebrae: No acute fracture. No primary bone lesion or focal pathologic process. Soft tissues and spinal canal: No prevertebral fluid or swelling. No visible canal hematoma. Retropharyngeal course of the cervical carotids. Disc levels: Ordinary degenerative changes. C6-7 ACDF with solid arthrodesis Upper chest: Negative IMPRESSION: 1. No evidence of acute intracranial or cervical spine injury. 2. Left scalp laceration without calvarial fracture. Electronically Signed   By: Jorje Guild M.D.   On: 10/04/2021 07:32   CT LUMBAR SPINE WO CONTRAST  Result Date: 10/04/2021 CLINICAL DATA:  L1 fracture. EXAM: CT LUMBAR SPINE WITHOUT CONTRAST TECHNIQUE: Multidetector CT imaging of the lumbar spine was performed without intravenous contrast administration. Multiplanar CT image reconstructions were also generated. COMPARISON:  Lumbar spine radiographs 10/04/2021. Lumbar spine MRI 10/21/2020. FINDINGS: Segmentation: 5 lumbar type vertebrae. Alignment: Normal. Vertebrae: Recent L1 superior endplate compression fracture with 20% vertebral body height loss on the right. No posterior element fracture. Other vertebral body heights are preserved. No suspicious osseous lesion. Paraspinal and other soft tissues: Abdominal aortic atherosclerosis without aneurysm. Partially  visualized left renal cysts. Cholecystectomy. Disc levels: Similar appearance of mild lumbar spondylosis compared to the prior MRI including a small right foraminal disc protrusion at L3-4 resulting in mild right neural foraminal stenosis. No evidence of significant spinal stenosis. Moderate facet arthrosis at L4-5 and on the left at L5-S1.  IMPRESSION: 1. L1 compression fracture with 20% height loss. 2. No additional fracture. 3. Aortic Atherosclerosis (ICD10-I70.0). Electronically Signed   By: Logan Bores M.D.   On: 10/04/2021 14:25   DG C-Arm 1-60 Min-No Report  Result Date: 10/05/2021 Fluoroscopy was utilized by the requesting physician.  No radiographic interpretation.     Discharge Exam: Vitals:   10/06/21 0506 10/06/21 0756  BP: (!) 145/74 (!) 145/75  Pulse: 69 77  Resp: 17 18  Temp:  98.8 F (37.1 C)  SpO2: 96% 96%   Vitals:   10/05/21 1745 10/05/21 1954 10/06/21 0506 10/06/21 0756  BP: 130/71 (!) 151/74 (!) 145/74 (!) 145/75  Pulse: 81 88 69 77  Resp: 18 16 17 18   Temp: 98.6 F (37 C) 98.5 F (36.9 C)  98.8 F (37.1 C)  TempSrc: Oral Oral  Oral  SpO2: 95% 95% 96% 96%  Weight:      Height:        General: Pt is alert, awake, not in acute distress Cardiovascular: RRR, S1/S2 +, no rubs, no gallops Respiratory: CTA bilaterally, no wheezing, no rhonchi Abdominal: Soft, NT, ND, bowel sounds + Extremities: no edema, no cyanosis    The results of significant diagnostics from this hospitalization (including imaging, microbiology, ancillary and laboratory) are listed below for reference.     Microbiology: Recent Results (from the past 240 hour(s))  Resp Panel by RT-PCR (Flu A&B, Covid) Nasopharyngeal Swab     Status: None   Collection Time: 10/04/21  3:31 PM   Specimen: Nasopharyngeal Swab; Nasopharyngeal(NP) swabs in vial transport medium  Result Value Ref Range Status   SARS Coronavirus 2 by RT PCR NEGATIVE NEGATIVE Final    Comment: (NOTE) SARS-CoV-2 target nucleic  acids are NOT DETECTED.  The SARS-CoV-2 RNA is generally detectable in upper respiratory specimens during the acute phase of infection. The lowest concentration of SARS-CoV-2 viral copies this assay can detect is 138 copies/mL. A negative result does not preclude SARS-Cov-2 infection and should not be used as the sole basis for treatment or other patient management decisions. A negative result may occur with  improper specimen collection/handling, submission of specimen other than nasopharyngeal swab, presence of viral mutation(s) within the areas targeted by this assay, and inadequate number of viral copies(<138 copies/mL). A negative result must be combined with clinical observations, patient history, and epidemiological information. The expected result is Negative.  Fact Sheet for Patients:  EntrepreneurPulse.com.au  Fact Sheet for Healthcare Providers:  IncredibleEmployment.be  This test is no t yet approved or cleared by the [place] FDA and  has been authorized for detection and/or diagnosis of SARS-CoV-2 by FDA under an Emergency Use Authorization (EUA). This EUA will remain  in effect (meaning this test can be used) for the duration of the COVID-19 declaration under Section 564(b)(1) of the Act, 21 U.S.C.section 360bbb-3(b)(1), unless the authorization is terminated  or revoked sooner.       Influenza A by PCR NEGATIVE NEGATIVE Final   Influenza B by PCR NEGATIVE NEGATIVE Final    Comment: (NOTE) The Xpert Xpress SARS-CoV-2/FLU/RSV plus assay is intended as an aid in the diagnosis of influenza from Nasopharyngeal swab specimens and should not be used as a sole basis for treatment. Nasal washings and aspirates are unacceptable for Xpert Xpress SARS-CoV-2/FLU/RSV testing.  Fact Sheet for Patients: EntrepreneurPulse.com.au  Fact Sheet for Healthcare Providers: IncredibleEmployment.be  This  test is not yet approved or cleared by the [place] and has been authorized for  detection and/or diagnosis of SARS-CoV-2 by FDA under an Emergency Use Authorization (EUA). This EUA will remain in effect (meaning this test can be used) for the duration of the COVID-19 declaration under Section 564(b)(1) of the Act, 21 U.S.C. section 360bbb-3(b)(1), unless the authorization is terminated or revoked.  Performed at [facility], [place] [address] [place], [place] [address]   Surgical pcr screen     Status: Abnormal   Collection Time: 10/05/21  1:49 PM   Specimen: Nasal Mucosa; Nasal Swab  Result Value Ref Range Status   MRSA, PCR NEGATIVE NEGATIVE Final   Staphylococcus aureus POSITIVE (A) NEGATIVE Final    Comment: (NOTE) The Xpert SA Assay (FDA approved for NASAL specimens in patients 10 years of age and older), is one component of a comprehensive surveillance program. It is not intended to diagnose infection nor to guide or monitor treatment. Performed at [facility], [place] [address] [place], [place] [address]      Labs: BNP (last 3 results) No results for input(s): BNP in the last 8760 hours. Basic Metabolic Panel: Recent Labs  Lab 10/04/21 0611 10/05/21 0036  NA 139 137  K 3.0* 3.5  CL 105 105  CO2 21* 23  GLUCOSE 112* 120*  BUN 14 12  CREATININE 1.06* 1.01*  CALCIUM 9.4 8.7*  MG  --  2.1   Liver Function Tests: Recent Labs  Lab 10/04/21 0611  AST 21  ALT 18  ALKPHOS 82  BILITOT 1.0  PROT 6.4*  ALBUMIN 3.8   No results for input(s): LIPASE, AMYLASE in the last 168 hours. No results for input(s): AMMONIA in the last 168 hours. CBC: Recent Labs  Lab 10/04/21 0611 10/05/21 0036  WBC 11.1* 7.2  NEUTROABS 9.0*  --   HGB 15.1* 14.7  HCT 45.7 43.8  MCV 87.2 90.1  PLT 239 211   Cardiac Enzymes: No results for input(s): CKTOTAL, CKMB, CKMBINDEX, TROPONINI in the last 168 hours. BNP: Invalid input(s): POCBNP CBG: No results for  input(s): GLUCAP in the last 168 hours. D-Dimer No results for input(s): DDIMER in the last 72 hours. Hgb A1c No results for input(s): HGBA1C in the last 72 hours. Lipid Profile No results for input(s): CHOL, HDL, LDLCALC, TRIG, CHOLHDL, LDLDIRECT in the last 72 hours. Thyroid function studies Recent Labs    10/05/21 0036  TSH 2.428   Anemia work up No results for input(s): VITAMINB12, FOLATE, FERRITIN, TIBC, IRON, RETICCTPCT in the last 72 hours. Urinalysis    Component Value Date/Time   COLORURINE YELLOW 05/28/2021 1005   APPEARANCEUR CLEAR 05/28/2021 1005   LABSPEC 1.015 05/28/2021 1005   PHURINE 5.0 05/28/2021 1005   GLUCOSEU NEGATIVE 05/28/2021 1005   HGBUR MODERATE (A) 05/28/2021 1005   BILIRUBINUR NEGATIVE 05/28/2021 1005   KETONESUR NEGATIVE 05/28/2021 1005   PROTEINUR NEGATIVE 05/28/2021 1005   UROBILINOGEN 0.2 08/23/2011 0312   NITRITE NEGATIVE 05/28/2021 1005   LEUKOCYTESUR MODERATE (A) 05/28/2021 1005   Sepsis Labs Invalid input(s): PROCALCITONIN,  WBC,  LACTICIDVEN Microbiology Recent Results (from the past 240 hour(s))  Resp Panel by RT-PCR (Flu A&B, Covid) Nasopharyngeal Swab     Status: None   Collection Time: 10/04/21  3:31 PM   Specimen: Nasopharyngeal Swab; Nasopharyngeal(NP) swabs in vial transport medium  Result Value Ref Range Status   SARS Coronavirus 2 by RT PCR NEGATIVE NEGATIVE Final    Comment: (NOTE) SARS-CoV-2 target nucleic acids are NOT DETECTED.  The SARS-CoV-2 RNA is generally detectable in  upper respiratory specimens during the acute phase of infection. The lowest concentration of SARS-CoV-2 viral copies this assay can detect is 138 copies/mL. A negative result does not preclude SARS-Cov-2 infection and should not be used as the sole basis for treatment or other patient management decisions. A negative result may occur with  improper specimen collection/handling, submission of specimen other than nasopharyngeal swab, presence of  viral mutation(s) within the areas targeted by this assay, and inadequate number of viral copies(<138 copies/mL). A negative result must be combined with clinical observations, patient history, and epidemiological information. The expected result is Negative.  Fact Sheet for Patients:  EntrepreneurPulse.com.au  Fact Sheet for Healthcare Providers:  IncredibleEmployment.be  This test is no t yet approved or cleared by the [place] FDA and  has been authorized for detection and/or diagnosis of SARS-CoV-2 by FDA under an Emergency Use Authorization (EUA). This EUA will remain  in effect (meaning this test can be used) for the duration of the COVID-19 declaration under Section 564(b)(1) of the Act, 21 U.S.C.section 360bbb-3(b)(1), unless the authorization is terminated  or revoked sooner.       Influenza A by PCR NEGATIVE NEGATIVE Final   Influenza B by PCR NEGATIVE NEGATIVE Final    Comment: (NOTE) The Xpert Xpress SARS-CoV-2/FLU/RSV plus assay is intended as an aid in the diagnosis of influenza from Nasopharyngeal swab specimens and should not be used as a sole basis for treatment. Nasal washings and aspirates are unacceptable for Xpert Xpress SARS-CoV-2/FLU/RSV testing.  Fact Sheet for Patients: EntrepreneurPulse.com.au  Fact Sheet for Healthcare Providers: IncredibleEmployment.be  This test is not yet approved or cleared by the [place] FDA and has been authorized for detection and/or diagnosis of SARS-CoV-2 by FDA under an Emergency Use Authorization (EUA). This EUA will remain in effect (meaning this test can be used) for the duration of the COVID-19 declaration under Section 564(b)(1) of the Act, 21 U.S.C. section 360bbb-3(b)(1), unless the authorization is terminated or revoked.  Performed at [facility], [place] [address] [place], [place] [address]   Surgical pcr screen      Status: Abnormal   Collection Time: 10/05/21  1:49 PM   Specimen: Nasal Mucosa; Nasal Swab  Result Value Ref Range Status   MRSA, PCR NEGATIVE NEGATIVE Final   Staphylococcus aureus POSITIVE (A) NEGATIVE Final    Comment: (NOTE) The Xpert SA Assay (FDA approved for NASAL specimens in patients 61 years of age and older), is one component of a comprehensive surveillance program. It is not intended to diagnose infection nor to guide or monitor treatment. Performed at [facility], [place] [address], [place], [place] [address]      Time coordinating discharge: Over 30 minutes  SIGNED:   Darliss Cheney, MD  [facility] 10/06/2021, 10:30 AM  If 7PM-7AM, please contact night-coverage [email or web address]

## 2021-10-06 NOTE — Progress Notes (Signed)
SWOT RN provided teaching to pt at bedside. Pt has verbalized understanding of teaching. IV has been removed and pt is waiting for their DME equipment for d/c. Pt is stable at this time.

## 2021-10-06 NOTE — Plan of Care (Signed)
Problem: Education: Goal: Knowledge of General Education information will improve Description: Including pain rating scale, medication(s)/side effects and non-pharmacologic comfort measures 10/06/2021 1251 by Damaris Schooner, RN Outcome: Adequate for Discharge 10/06/2021 1251 by Damaris Schooner, RN Outcome: Adequate for Discharge 10/06/2021 1122 by Damaris Schooner, RN Outcome: Progressing   Problem: Health Behavior/Discharge Planning: Goal: Ability to manage health-related needs will improve 10/06/2021 1251 by Damaris Schooner, RN Outcome: Adequate for Discharge 10/06/2021 1251 by Damaris Schooner, RN Outcome: Adequate for Discharge 10/06/2021 1122 by Damaris Schooner, RN Outcome: Progressing   Problem: Clinical Measurements: Goal: Ability to maintain clinical measurements within normal limits will improve 10/06/2021 1251 by Damaris Schooner, RN Outcome: Adequate for Discharge 10/06/2021 1251 by Damaris Schooner, RN Outcome: Adequate for Discharge 10/06/2021 1122 by Damaris Schooner, RN Outcome: Progressing Goal: Will remain free from infection 10/06/2021 1251 by Damaris Schooner, RN Outcome: Adequate for Discharge 10/06/2021 1251 by Damaris Schooner, RN Outcome: Adequate for Discharge 10/06/2021 1122 by Damaris Schooner, RN Outcome: Progressing Goal: Diagnostic test results will improve 10/06/2021 1251 by Damaris Schooner, RN Outcome: Adequate for Discharge 10/06/2021 1251 by Damaris Schooner, RN Outcome: Adequate for Discharge 10/06/2021 1122 by Damaris Schooner, RN Outcome: Progressing Goal: Respiratory complications will improve 10/06/2021 1251 by Damaris Schooner, RN Outcome: Adequate for Discharge 10/06/2021 1251 by Damaris Schooner, RN Outcome: Adequate for Discharge 10/06/2021 1122 by Damaris Schooner, RN Outcome: Progressing Goal: Cardiovascular complication will be avoided 10/06/2021 1251 by Damaris Schooner, RN Outcome: Adequate for Discharge 10/06/2021 1251 by Damaris Schooner, RN Outcome: Adequate for  Discharge 10/06/2021 1122 by Damaris Schooner, RN Outcome: Progressing   Problem: Activity: Goal: Risk for activity intolerance will decrease 10/06/2021 1251 by Damaris Schooner, RN Outcome: Adequate for Discharge 10/06/2021 1251 by Damaris Schooner, RN Outcome: Adequate for Discharge 10/06/2021 1122 by Damaris Schooner, RN Outcome: Progressing   Problem: Nutrition: Goal: Adequate nutrition will be maintained 10/06/2021 1251 by Damaris Schooner, RN Outcome: Adequate for Discharge 10/06/2021 1251 by Damaris Schooner, RN Outcome: Adequate for Discharge 10/06/2021 1122 by Damaris Schooner, RN Outcome: Progressing   Problem: Coping: Goal: Level of anxiety will decrease 10/06/2021 1251 by Damaris Schooner, RN Outcome: Adequate for Discharge 10/06/2021 1251 by Damaris Schooner, RN Outcome: Adequate for Discharge 10/06/2021 1122 by Damaris Schooner, RN Outcome: Progressing   Problem: Elimination: Goal: Will not experience complications related to bowel motility 10/06/2021 1251 by Damaris Schooner, RN Outcome: Adequate for Discharge 10/06/2021 1251 by Damaris Schooner, RN Outcome: Adequate for Discharge 10/06/2021 1122 by Damaris Schooner, RN Outcome: Progressing Goal: Will not experience complications related to urinary retention 10/06/2021 1251 by Damaris Schooner, RN Outcome: Adequate for Discharge 10/06/2021 1251 by Damaris Schooner, RN Outcome: Adequate for Discharge 10/06/2021 1122 by Damaris Schooner, RN Outcome: Progressing   Problem: Pain Managment: Goal: General experience of comfort will improve 10/06/2021 1251 by Damaris Schooner, RN Outcome: Adequate for Discharge 10/06/2021 1251 by Damaris Schooner, RN Outcome: Adequate for Discharge 10/06/2021 1122 by Damaris Schooner, RN Outcome: Progressing   Problem: Safety: Goal: Ability to remain free from injury will improve 10/06/2021 1251 by Damaris Schooner, RN Outcome: Adequate for Discharge 10/06/2021 1251 by Damaris Schooner, RN Outcome: Adequate for  Discharge 10/06/2021 1122 by Damaris Schooner, RN Outcome: Progressing   Problem: Skin Integrity: Goal: Risk for impaired skin integrity will  decrease 10/06/2021 1251 by Damaris Schooner, RN Outcome: Adequate for Discharge 10/06/2021 1251 by Damaris Schooner, RN Outcome: Adequate for Discharge 10/06/2021 1122 by Damaris Schooner, RN Outcome: Progressing

## 2021-10-06 NOTE — Progress Notes (Signed)
Physical Therapy Treatment Patient Details Name: Rebecca Steele MRN: [id number] DOB: [birth year] Today's Date: 10/06/2021   History of Present Illness Patient is a 71 y/o female who presents with L1 compression fx s/p fall. Underwent L1 kyphoplasty on  01/03. PMH includes HTN, mitral valve prolapse, renal disease.    PT Comments    Received pt sitting in recliner with spouse present at bedside. Discussed D/C plan with pt with PT recommendation to use RW initially for energy conservation and balance purposes and pt in agreement and reporting already having one at home. Pt also expressed not wanting any follow up PT - educated pt on importance of remaining mobile and staying OOB upon D/C. Pt performed all transfers with RW and min guard/close supervision and progressed well with ambulation in hallway today. Returned to bed with supervision and left with all needs within reach. Acute PT to cont to follow.      Recommendations for follow up therapy are one component of a multi-disciplinary discharge planning process, led by the attending physician.  Recommendations may be updated based on patient status, additional functional criteria and insurance authorization.  Follow Up Recommendations  No PT follow up     Assistance Recommended at Discharge Intermittent Supervision/Assistance  Patient can return home with the following A little help with walking and/or transfers   Equipment Recommendations  Rolling walker (2 wheels) (pt reported having RW at home)    Recommendations for Other Services       Precautions / Restrictions Precautions Precautions: Fall Spinal Brace: Other (comment) (no brace needed post surgery per updated MD note) Restrictions Weight Bearing Restrictions: No     Mobility  Bed Mobility Overal bed mobility: Needs Assistance Bed Mobility: Sit to Supine Rolling: Supervision Sidelying to sit: Min assist   Sit to supine: Supervision   General bed mobility  comments: HOB elevated and use of bedrails Patient Response: Cooperative  Transfers Overall transfer level: Needs assistance Equipment used: Rolling walker (2 wheels) Transfers: Sit to/from Stand Sit to Stand: Min guard           General transfer comment: to stand from recliner with RW    Ambulation/Gait Ambulation/Gait assistance: Min guard Gait Distance (Feet): 200 Feet Assistive device: Rolling walker (2 wheels) Gait Pattern/deviations: Step-through pattern;Decreased step length - right;Decreased step length - left;Decreased stride length;Narrow base of support Gait velocity: decreased Gait velocity interpretation: 1.31 - 2.62 ft/sec, indicative of limited community ambulator   General Gait Details: pt reported mild dizziness when looking down   Stairs             Wheelchair Mobility    Modified Rankin (Stroke Patients Only)       Balance Overall balance assessment: Needs assistance Sitting-balance support: Feet supported;No upper extremity supported Sitting balance-Leahy Scale: Good Sitting balance - Comments: sitting EOB   Standing balance support: Bilateral upper extremity supported (RW) Standing balance-Leahy Scale: Fair Standing balance comment: able to stand with RW and close supervision                            Cognition Arousal/Alertness: Awake/alert Behavior During Therapy: WFL for tasks assessed/performed Overall Cognitive Status: Within Functional Limits for tasks assessed                                          Exercises  General Comments General comments (skin integrity, edema, etc.): spouse present at bedside. Dicussed DME, pt/spouse reporting already having RW but requesting shower chair. Pt politely declined any follow up PT      Pertinent Vitals/Pain Pain Assessment: No/denies pain (soreness around incision site)    Home Living Family/patient expects to be discharged to:: Private  residence Living Arrangements: Spouse/significant other Available Help at Discharge: Family Type of Home: House Home Access: Level entry       Home Layout: One level Home Equipment: Conservation officer, nature (2 wheels);Grab bars - tub/shower;Grab bars - toilet Additional Comments: lives in [place] with spouse    Prior Function            PT Goals (current goals can now be found in the care plan section) Acute Rehab PT Goals Patient Stated Goal: reduce pain, have kyphoplasty PT Goal Formulation: With patient Time For Goal Achievement: 10/19/21 Potential to Achieve Goals: Good Progress towards PT goals: Progressing toward goals    Frequency    Min 4X/week      PT Plan Current plan remains appropriate    Co-evaluation              AM-PAC PT "6 Clicks" Mobility   Outcome Measure  Help needed turning from your back to your side while in a flat bed without using bedrails?: None Help needed moving from lying on your back to sitting on the side of a flat bed without using bedrails?: Total Help needed moving to and from a bed to a chair (including a wheelchair)?: A Little Help needed standing up from a chair using your arms (e.g., wheelchair or bedside chair)?: A Little Help needed to walk in hospital room?: A Little Help needed climbing 3-5 steps with a railing? : A Little 6 Click Score: 17    End of Session Equipment Utilized During Treatment: Gait belt Activity Tolerance: Patient tolerated treatment well Patient left: in bed;with call bell/phone within reach;with family/visitor present Nurse Communication: Mobility status PT Visit Diagnosis: Unsteadiness on feet (R26.81);Other abnormalities of gait and mobility (R26.89);Muscle weakness (generalized) (M62.81);Pain Pain - part of body:  (back)     Time: 5361-4431 PT Time Calculation (min) (ACUTE ONLY): 20 min  Charges:  $Gait Training: 8-22 mins                     Becky Sax PT, DPT  Blenda Nicely 10/06/2021, 9:20  AM

## 2021-10-06 NOTE — Plan of Care (Signed)
°  Problem: Education: Goal: Knowledge of General Education information will improve Description: Including pain rating scale, medication(s)/side effects and non-pharmacologic comfort measures 10/06/2021 1251 by Damaris Schooner, RN Outcome: Adequate for Discharge 10/06/2021 1122 by Damaris Schooner, RN Outcome: Progressing   Problem: Health Behavior/Discharge Planning: Goal: Ability to manage health-related needs will improve 10/06/2021 1251 by Damaris Schooner, RN Outcome: Adequate for Discharge 10/06/2021 1122 by Damaris Schooner, RN Outcome: Progressing   Problem: Clinical Measurements: Goal: Ability to maintain clinical measurements within normal limits will improve 10/06/2021 1251 by Damaris Schooner, RN Outcome: Adequate for Discharge 10/06/2021 1122 by Damaris Schooner, RN Outcome: Progressing Goal: Will remain free from infection 10/06/2021 1251 by Damaris Schooner, RN Outcome: Adequate for Discharge 10/06/2021 1122 by Damaris Schooner, RN Outcome: Progressing Goal: Diagnostic test results will improve 10/06/2021 1251 by Damaris Schooner, RN Outcome: Adequate for Discharge 10/06/2021 1122 by Damaris Schooner, RN Outcome: Progressing Goal: Respiratory complications will improve 10/06/2021 1251 by Damaris Schooner, RN Outcome: Adequate for Discharge 10/06/2021 1122 by Damaris Schooner, RN Outcome: Progressing Goal: Cardiovascular complication will be avoided 10/06/2021 1251 by Damaris Schooner, RN Outcome: Adequate for Discharge 10/06/2021 1122 by Damaris Schooner, RN Outcome: Progressing   Problem: Activity: Goal: Risk for activity intolerance will decrease 10/06/2021 1251 by Damaris Schooner, RN Outcome: Adequate for Discharge 10/06/2021 1122 by Damaris Schooner, RN Outcome: Progressing   Problem: Nutrition: Goal: Adequate nutrition will be maintained 10/06/2021 1251 by Damaris Schooner, RN Outcome: Adequate for Discharge 10/06/2021 1122 by Damaris Schooner, RN Outcome: Progressing   Problem:  Coping: Goal: Level of anxiety will decrease 10/06/2021 1251 by Damaris Schooner, RN Outcome: Adequate for Discharge 10/06/2021 1122 by Damaris Schooner, RN Outcome: Progressing   Problem: Elimination: Goal: Will not experience complications related to bowel motility 10/06/2021 1251 by Damaris Schooner, RN Outcome: Adequate for Discharge 10/06/2021 1122 by Damaris Schooner, RN Outcome: Progressing Goal: Will not experience complications related to urinary retention 10/06/2021 1251 by Damaris Schooner, RN Outcome: Adequate for Discharge 10/06/2021 1122 by Damaris Schooner, RN Outcome: Progressing   Problem: Pain Managment: Goal: General experience of comfort will improve 10/06/2021 1251 by Damaris Schooner, RN Outcome: Adequate for Discharge 10/06/2021 1122 by Damaris Schooner, RN Outcome: Progressing   Problem: Safety: Goal: Ability to remain free from injury will improve 10/06/2021 1251 by Damaris Schooner, RN Outcome: Adequate for Discharge 10/06/2021 1122 by Damaris Schooner, RN Outcome: Progressing   Problem: Skin Integrity: Goal: Risk for impaired skin integrity will decrease 10/06/2021 1251 by Damaris Schooner, RN Outcome: Adequate for Discharge 10/06/2021 1122 by Damaris Schooner, RN Outcome: Progressing

## 2021-10-08 DIAGNOSIS — S0990XS Unspecified injury of head, sequela: Secondary | ICD-10-CM | POA: Diagnosis not present

## 2021-10-08 DIAGNOSIS — S32018A Other fracture of first lumbar vertebra, initial encounter for closed fracture: Secondary | ICD-10-CM | POA: Diagnosis not present

## 2021-10-08 DIAGNOSIS — S32010A Wedge compression fracture of first lumbar vertebra, initial encounter for closed fracture: Secondary | ICD-10-CM | POA: Diagnosis not present

## 2021-10-08 DIAGNOSIS — S32018D Other fracture of first lumbar vertebra, subsequent encounter for fracture with routine healing: Secondary | ICD-10-CM | POA: Diagnosis not present

## 2021-10-08 DIAGNOSIS — S0990XD Unspecified injury of head, subsequent encounter: Secondary | ICD-10-CM | POA: Diagnosis not present

## 2021-10-08 DIAGNOSIS — S32010D Wedge compression fracture of first lumbar vertebra, subsequent encounter for fracture with routine healing: Secondary | ICD-10-CM | POA: Diagnosis not present

## 2021-10-08 DIAGNOSIS — S0990XA Unspecified injury of head, initial encounter: Secondary | ICD-10-CM | POA: Diagnosis not present

## 2021-10-08 DIAGNOSIS — E876 Hypokalemia: Secondary | ICD-10-CM | POA: Diagnosis not present

## 2021-10-11 DIAGNOSIS — M545 Low back pain, unspecified: Secondary | ICD-10-CM | POA: Diagnosis not present

## 2021-10-11 DIAGNOSIS — Z4802 Encounter for removal of sutures: Secondary | ICD-10-CM | POA: Diagnosis not present

## 2021-10-11 DIAGNOSIS — I10 Essential (primary) hypertension: Secondary | ICD-10-CM | POA: Diagnosis not present

## 2021-10-26 DIAGNOSIS — I10 Essential (primary) hypertension: Secondary | ICD-10-CM | POA: Diagnosis not present

## 2021-10-26 DIAGNOSIS — R42 Dizziness and giddiness: Secondary | ICD-10-CM | POA: Diagnosis not present

## 2021-10-26 DIAGNOSIS — R739 Hyperglycemia, unspecified: Secondary | ICD-10-CM | POA: Diagnosis not present

## 2021-10-26 DIAGNOSIS — S32010A Wedge compression fracture of first lumbar vertebra, initial encounter for closed fracture: Secondary | ICD-10-CM | POA: Diagnosis not present

## 2021-11-11 DIAGNOSIS — R739 Hyperglycemia, unspecified: Secondary | ICD-10-CM | POA: Diagnosis not present

## 2021-11-11 DIAGNOSIS — E039 Hypothyroidism, unspecified: Secondary | ICD-10-CM | POA: Diagnosis not present

## 2021-11-15 DIAGNOSIS — E039 Hypothyroidism, unspecified: Secondary | ICD-10-CM | POA: Diagnosis not present

## 2021-11-15 DIAGNOSIS — I10 Essential (primary) hypertension: Secondary | ICD-10-CM | POA: Diagnosis not present

## 2021-11-15 DIAGNOSIS — R7303 Prediabetes: Secondary | ICD-10-CM | POA: Diagnosis not present

## 2021-11-15 DIAGNOSIS — E876 Hypokalemia: Secondary | ICD-10-CM | POA: Diagnosis not present

## 2021-12-03 DIAGNOSIS — S32009A Unspecified fracture of unspecified lumbar vertebra, initial encounter for closed fracture: Secondary | ICD-10-CM | POA: Diagnosis not present

## 2021-12-03 DIAGNOSIS — R030 Elevated blood-pressure reading, without diagnosis of hypertension: Secondary | ICD-10-CM | POA: Diagnosis not present

## 2021-12-03 DIAGNOSIS — Z6830 Body mass index (BMI) 30.0-30.9, adult: Secondary | ICD-10-CM | POA: Diagnosis not present

## 2021-12-15 ENCOUNTER — Other Ambulatory Visit (HOSPITAL_BASED_OUTPATIENT_CLINIC_OR_DEPARTMENT_OTHER): Payer: Self-pay | Admitting: Neurological Surgery

## 2021-12-15 DIAGNOSIS — S32009K Unspecified fracture of unspecified lumbar vertebra, subsequent encounter for fracture with nonunion: Secondary | ICD-10-CM

## 2021-12-16 ENCOUNTER — Ambulatory Visit (HOSPITAL_BASED_OUTPATIENT_CLINIC_OR_DEPARTMENT_OTHER)
Admission: RE | Admit: 2021-12-16 | Discharge: 2021-12-16 | Disposition: A | Discharge status: Home / Self Care | Payer: Medicare HMO | Source: Ambulatory Visit | Attending: Neurological Surgery | Admitting: Neurological Surgery

## 2021-12-16 ENCOUNTER — Other Ambulatory Visit: Payer: Self-pay

## 2021-12-16 DIAGNOSIS — S32009K Unspecified fracture of unspecified lumbar vertebra, subsequent encounter for fracture with nonunion: Secondary | ICD-10-CM | POA: Diagnosis not present

## 2021-12-16 DIAGNOSIS — S32019D Unspecified fracture of first lumbar vertebra, subsequent encounter for fracture with routine healing: Secondary | ICD-10-CM | POA: Diagnosis not present

## 2021-12-17 DIAGNOSIS — Z6830 Body mass index (BMI) 30.0-30.9, adult: Secondary | ICD-10-CM | POA: Diagnosis not present

## 2021-12-17 DIAGNOSIS — R030 Elevated blood-pressure reading, without diagnosis of hypertension: Secondary | ICD-10-CM | POA: Diagnosis not present

## 2021-12-17 DIAGNOSIS — S32009A Unspecified fracture of unspecified lumbar vertebra, initial encounter for closed fracture: Secondary | ICD-10-CM | POA: Diagnosis not present

## 2021-12-23 DIAGNOSIS — M533 Sacrococcygeal disorders, not elsewhere classified: Secondary | ICD-10-CM | POA: Diagnosis not present

## 2021-12-23 DIAGNOSIS — M545 Low back pain, unspecified: Secondary | ICD-10-CM | POA: Diagnosis not present

## 2022-01-09 DIAGNOSIS — U071 COVID-19: Secondary | ICD-10-CM | POA: Diagnosis not present

## 2022-01-13 DIAGNOSIS — J069 Acute upper respiratory infection, unspecified: Secondary | ICD-10-CM | POA: Diagnosis not present

## 2022-01-13 DIAGNOSIS — U071 COVID-19: Secondary | ICD-10-CM | POA: Diagnosis not present

## 2022-01-13 DIAGNOSIS — T7840XA Allergy, unspecified, initial encounter: Secondary | ICD-10-CM | POA: Diagnosis not present

## 2022-01-26 DIAGNOSIS — E876 Hypokalemia: Secondary | ICD-10-CM | POA: Diagnosis not present

## 2022-01-26 DIAGNOSIS — R7303 Prediabetes: Secondary | ICD-10-CM | POA: Diagnosis not present

## 2022-01-26 DIAGNOSIS — E039 Hypothyroidism, unspecified: Secondary | ICD-10-CM | POA: Diagnosis not present

## 2022-01-26 DIAGNOSIS — I10 Essential (primary) hypertension: Secondary | ICD-10-CM | POA: Diagnosis not present

## 2022-01-27 DIAGNOSIS — J309 Allergic rhinitis, unspecified: Secondary | ICD-10-CM | POA: Diagnosis not present

## 2022-01-27 DIAGNOSIS — E039 Hypothyroidism, unspecified: Secondary | ICD-10-CM | POA: Diagnosis not present

## 2022-01-27 DIAGNOSIS — J40 Bronchitis, not specified as acute or chronic: Secondary | ICD-10-CM | POA: Diagnosis not present

## 2022-01-27 DIAGNOSIS — E782 Mixed hyperlipidemia: Secondary | ICD-10-CM | POA: Diagnosis not present

## 2022-02-17 DIAGNOSIS — Z1331 Encounter for screening for depression: Secondary | ICD-10-CM | POA: Diagnosis not present

## 2022-02-17 DIAGNOSIS — Z0000 Encounter for general adult medical examination without abnormal findings: Secondary | ICD-10-CM | POA: Diagnosis not present

## 2022-02-17 DIAGNOSIS — Z1339 Encounter for screening examination for other mental health and behavioral disorders: Secondary | ICD-10-CM | POA: Diagnosis not present

## 2022-02-21 ENCOUNTER — Other Ambulatory Visit: Payer: Self-pay | Admitting: Family Medicine

## 2022-02-21 DIAGNOSIS — F411 Generalized anxiety disorder: Secondary | ICD-10-CM | POA: Diagnosis not present

## 2022-02-21 DIAGNOSIS — N3281 Overactive bladder: Secondary | ICD-10-CM | POA: Diagnosis not present

## 2022-02-21 DIAGNOSIS — I10 Essential (primary) hypertension: Secondary | ICD-10-CM | POA: Diagnosis not present

## 2022-02-21 DIAGNOSIS — M810 Age-related osteoporosis without current pathological fracture: Secondary | ICD-10-CM

## 2022-02-21 DIAGNOSIS — Z1231 Encounter for screening mammogram for malignant neoplasm of breast: Secondary | ICD-10-CM

## 2022-02-21 DIAGNOSIS — Z008 Encounter for other general examination: Secondary | ICD-10-CM | POA: Diagnosis not present

## 2022-02-21 DIAGNOSIS — G4700 Insomnia, unspecified: Secondary | ICD-10-CM | POA: Diagnosis not present

## 2022-02-21 DIAGNOSIS — E2839 Other primary ovarian failure: Secondary | ICD-10-CM

## 2022-02-21 DIAGNOSIS — J309 Allergic rhinitis, unspecified: Secondary | ICD-10-CM | POA: Diagnosis not present

## 2022-02-21 DIAGNOSIS — R69 Illness, unspecified: Secondary | ICD-10-CM | POA: Diagnosis not present

## 2022-02-21 DIAGNOSIS — Z791 Long term (current) use of non-steroidal anti-inflammatories (NSAID): Secondary | ICD-10-CM | POA: Diagnosis not present

## 2022-02-21 DIAGNOSIS — Z809 Family history of malignant neoplasm, unspecified: Secondary | ICD-10-CM | POA: Diagnosis not present

## 2022-02-21 DIAGNOSIS — E785 Hyperlipidemia, unspecified: Secondary | ICD-10-CM | POA: Diagnosis not present

## 2022-02-21 DIAGNOSIS — N393 Stress incontinence (female) (male): Secondary | ICD-10-CM | POA: Diagnosis not present

## 2022-02-21 DIAGNOSIS — E039 Hypothyroidism, unspecified: Secondary | ICD-10-CM | POA: Diagnosis not present

## 2022-02-21 DIAGNOSIS — M1990 Unspecified osteoarthritis, unspecified site: Secondary | ICD-10-CM | POA: Diagnosis not present

## 2022-03-23 DIAGNOSIS — J309 Allergic rhinitis, unspecified: Secondary | ICD-10-CM | POA: Diagnosis not present

## 2022-03-23 DIAGNOSIS — Z98818 Other dental procedure status: Secondary | ICD-10-CM | POA: Diagnosis not present

## 2022-03-28 DIAGNOSIS — H2513 Age-related nuclear cataract, bilateral: Secondary | ICD-10-CM | POA: Diagnosis not present

## 2022-04-26 DIAGNOSIS — E782 Mixed hyperlipidemia: Secondary | ICD-10-CM | POA: Diagnosis not present

## 2022-04-26 DIAGNOSIS — E039 Hypothyroidism, unspecified: Secondary | ICD-10-CM | POA: Diagnosis not present

## 2022-04-26 DIAGNOSIS — E559 Vitamin D deficiency, unspecified: Secondary | ICD-10-CM | POA: Diagnosis not present

## 2022-04-26 DIAGNOSIS — R7303 Prediabetes: Secondary | ICD-10-CM | POA: Diagnosis not present

## 2022-04-26 DIAGNOSIS — I10 Essential (primary) hypertension: Secondary | ICD-10-CM | POA: Diagnosis not present

## 2022-04-28 DIAGNOSIS — E559 Vitamin D deficiency, unspecified: Secondary | ICD-10-CM | POA: Diagnosis not present

## 2022-04-28 DIAGNOSIS — R7303 Prediabetes: Secondary | ICD-10-CM | POA: Diagnosis not present

## 2022-04-28 DIAGNOSIS — E782 Mixed hyperlipidemia: Secondary | ICD-10-CM | POA: Diagnosis not present

## 2022-04-28 DIAGNOSIS — I10 Essential (primary) hypertension: Secondary | ICD-10-CM | POA: Diagnosis not present

## 2022-04-28 DIAGNOSIS — Z789 Other specified health status: Secondary | ICD-10-CM | POA: Diagnosis not present

## 2022-04-28 DIAGNOSIS — G720 Drug-induced myopathy: Secondary | ICD-10-CM | POA: Diagnosis not present

## 2022-04-28 DIAGNOSIS — Z23 Encounter for immunization: Secondary | ICD-10-CM | POA: Diagnosis not present

## 2022-05-09 DIAGNOSIS — B359 Dermatophytosis, unspecified: Secondary | ICD-10-CM | POA: Diagnosis not present

## 2022-05-09 DIAGNOSIS — Z23 Encounter for immunization: Secondary | ICD-10-CM | POA: Diagnosis not present

## 2022-05-09 DIAGNOSIS — I10 Essential (primary) hypertension: Secondary | ICD-10-CM | POA: Diagnosis not present

## 2022-05-09 DIAGNOSIS — Z1211 Encounter for screening for malignant neoplasm of colon: Secondary | ICD-10-CM | POA: Diagnosis not present

## 2022-05-09 DIAGNOSIS — E559 Vitamin D deficiency, unspecified: Secondary | ICD-10-CM | POA: Diagnosis not present

## 2022-05-09 DIAGNOSIS — Z0000 Encounter for general adult medical examination without abnormal findings: Secondary | ICD-10-CM | POA: Diagnosis not present

## 2022-05-19 DIAGNOSIS — M25551 Pain in right hip: Secondary | ICD-10-CM | POA: Diagnosis not present

## 2022-05-19 DIAGNOSIS — L905 Scar conditions and fibrosis of skin: Secondary | ICD-10-CM | POA: Diagnosis not present

## 2022-05-19 DIAGNOSIS — L821 Other seborrheic keratosis: Secondary | ICD-10-CM | POA: Diagnosis not present

## 2022-05-19 DIAGNOSIS — M5451 Vertebrogenic low back pain: Secondary | ICD-10-CM | POA: Diagnosis not present

## 2022-05-19 DIAGNOSIS — R234 Changes in skin texture: Secondary | ICD-10-CM | POA: Diagnosis not present

## 2022-05-19 DIAGNOSIS — M7918 Myalgia, other site: Secondary | ICD-10-CM | POA: Diagnosis not present

## 2022-06-04 IMAGING — MR MR LUMBAR SPINE W/O CM
4 of 5 series · 27 of 48 positions shown · non-contrast
Comparison: None.

CLINICAL DATA: Low back pain

EXAM:
MRI LUMBAR SPINE WITHOUT CONTRAST
TECHNIQUE: Multiplanar, multisequence MR imaging of the lumbar spine was
performed. No intravenous contrast was administered.

[Series 3: T2 · sagittal · 4.0mm · 1.09mm/px · 6 of 17 slices shown (1 of 2)]
[im 1/17]
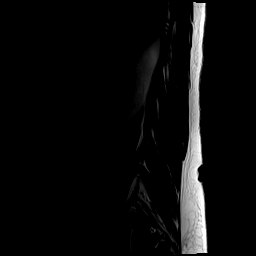
[im 4/17]
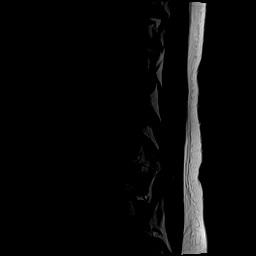
[im 7/17]
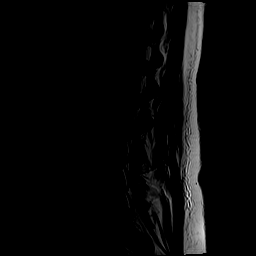
[im 10/17]
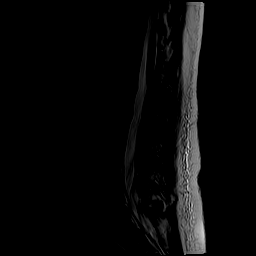
[im 13/17]
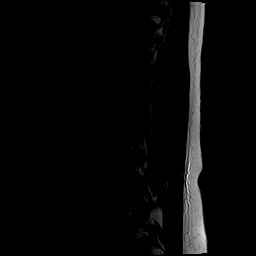
[im 17/17]
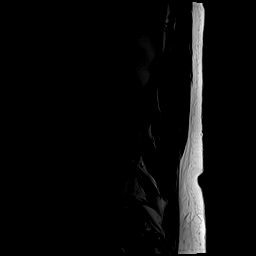

[Series 5: T1 · sagittal · 4.0mm · 1.09mm/px · 6 of 17 slices shown (1 of 2)]
[im 1/17]
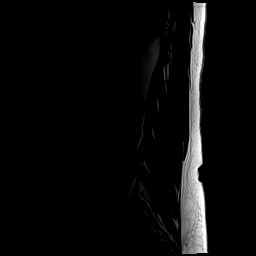
[im 4/17]
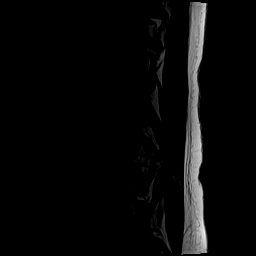
[im 7/17]
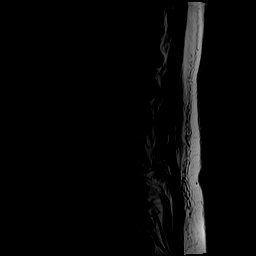
[im 10/17]
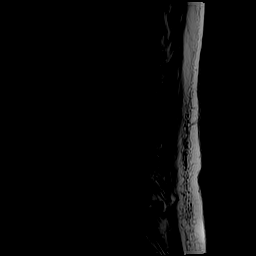
[im 13/17]
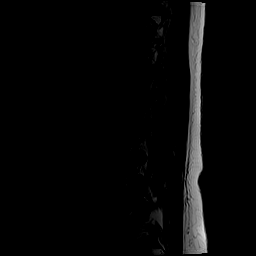
[im 17/17]
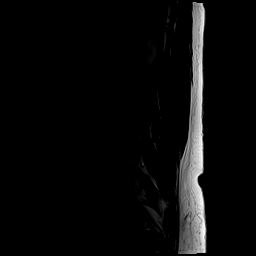

[Series 6: T2 · axial · 4.0mm · 0.39mm/px · z∈[+37,+251]mm · 9 of 42 slices shown (2 of 2)]
[im 1/42]
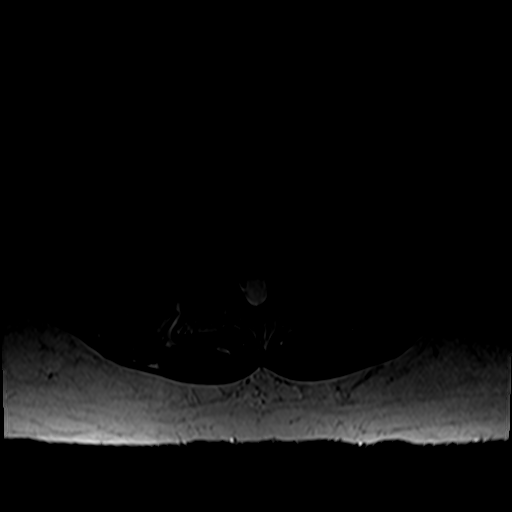
[im 6/42]
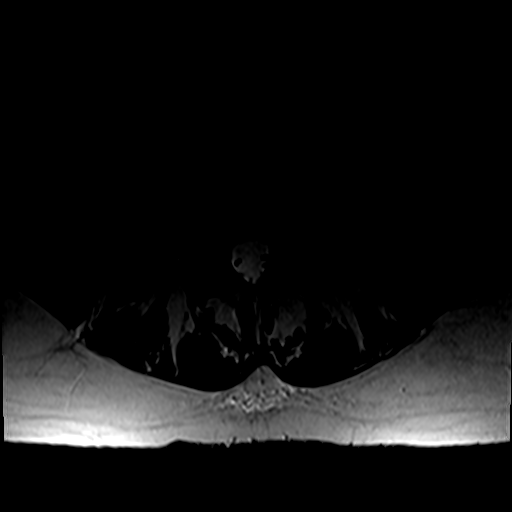
[im 12/42]
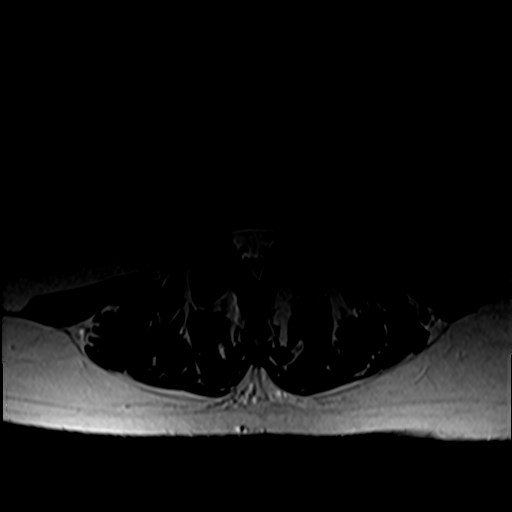
[im 18/42]
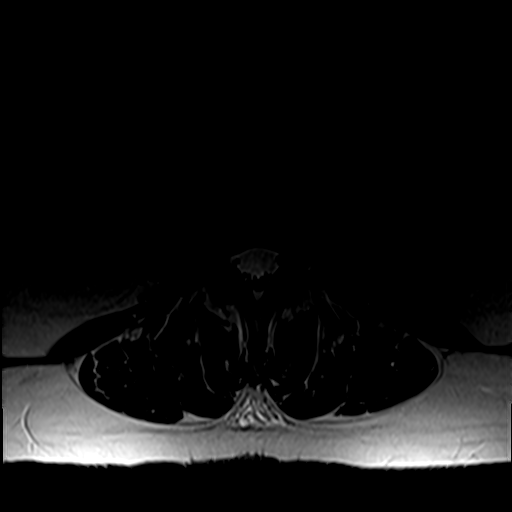
[im 21/42]
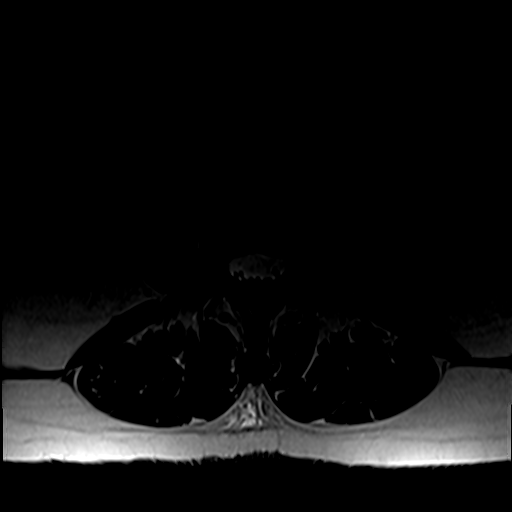
[im 24/42]
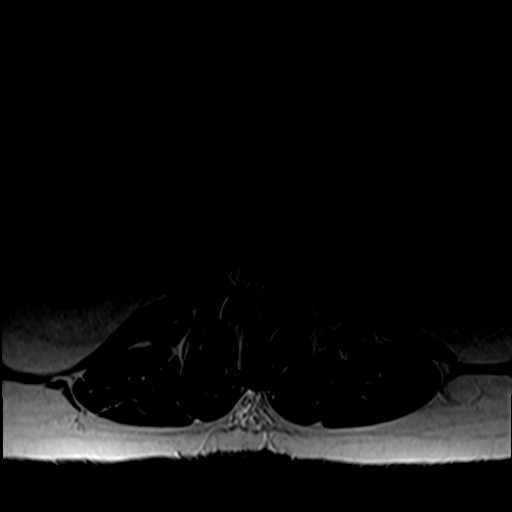
[im 30/42]
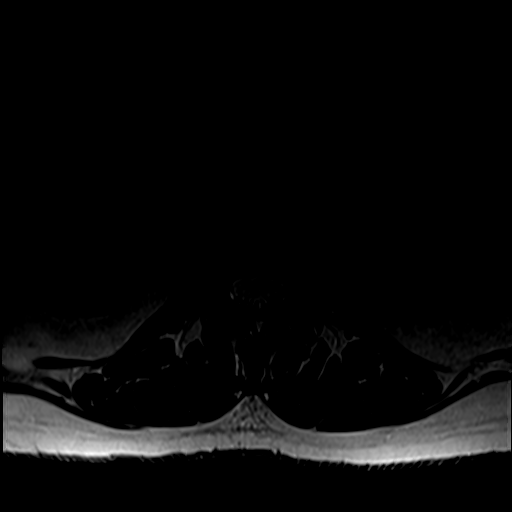
[im 36/42]
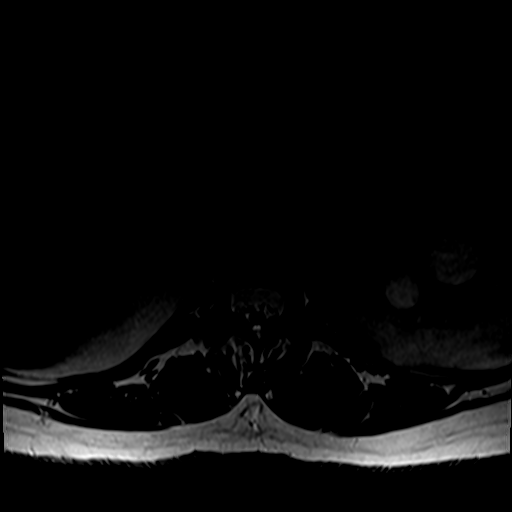
[im 42/42]
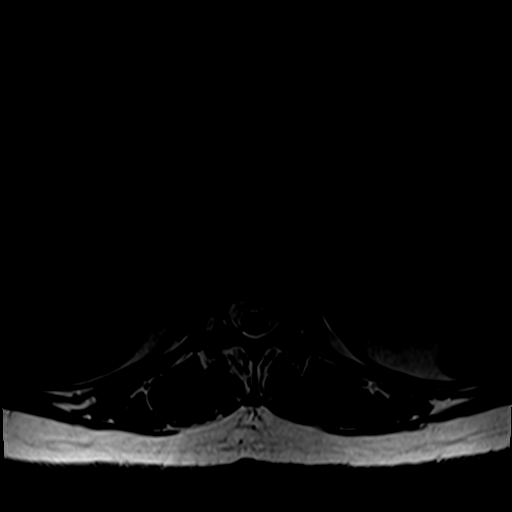

[Series 7: T1 · axial · 4.0mm · 0.39mm/px · z∈[+37,+223]mm · 6 of 42 slices shown (2 of 2)]
[im 1/42]
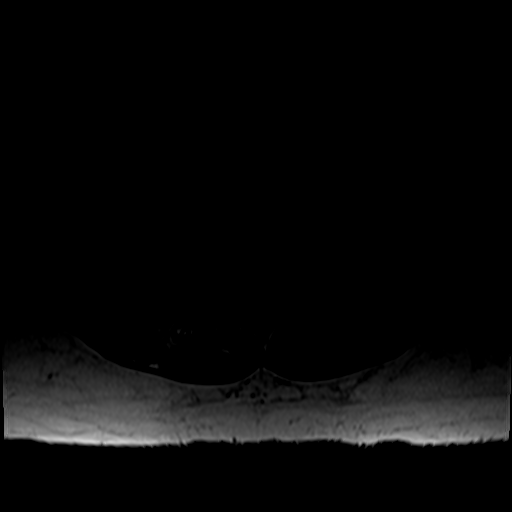
[im 6/42]
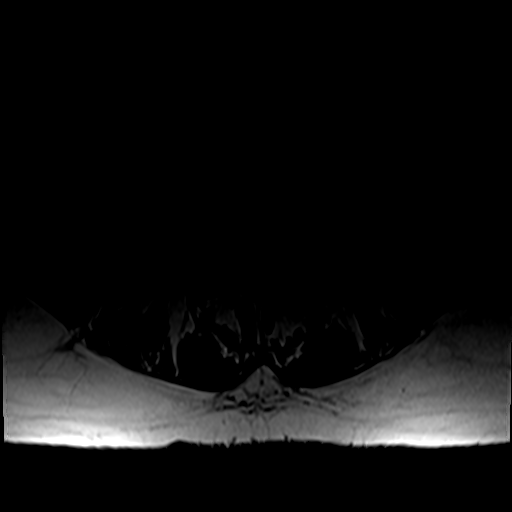
[im 12/42]
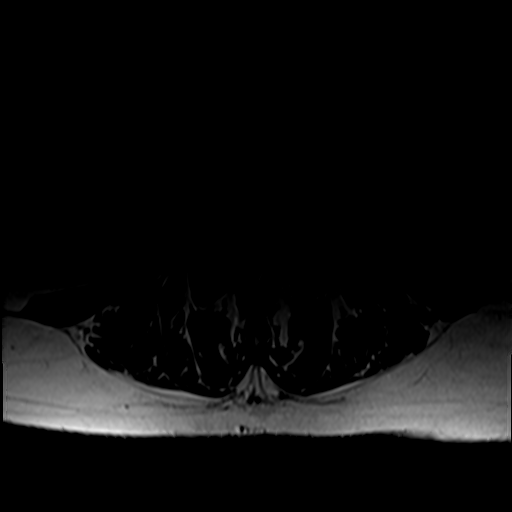
[im 18/42]
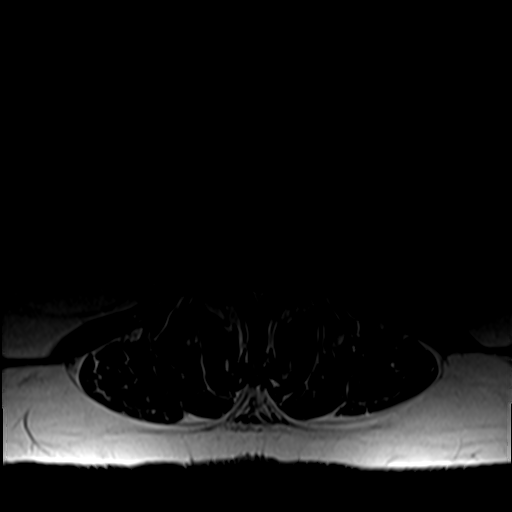
[im 21/42]
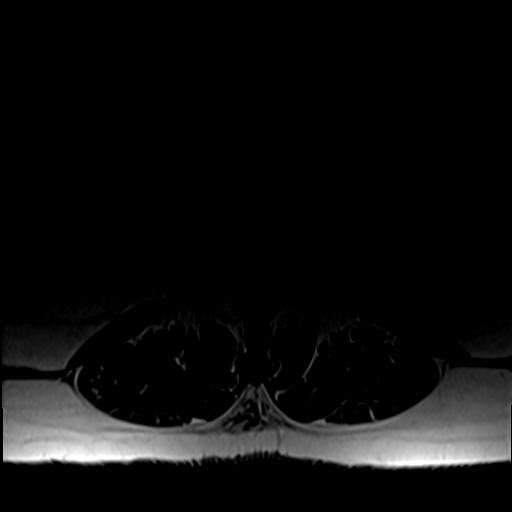
[im 36/42]
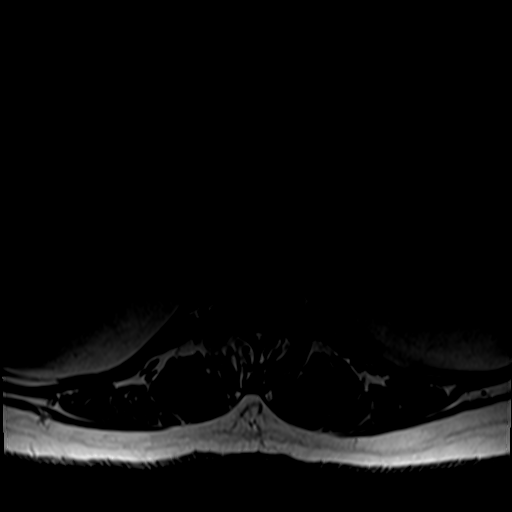

[27 of 48 positions shown; findings below may reference images not displayed]

FINDINGS: Segmentation:  Standard.

Alignment:  Preserved.

Vertebrae: Vertebral body heights are maintained. There is no
significant marrow edema. No suspicious osseous lesion.

Conus medullaris and cauda equina: Conus extends to the L1-L2 level.
Conus and cauda equina appear normal.

Paraspinal and other soft tissues: Bilateral renal T2 hyperintense
lesions are incompletely characterized but statistically likely
reflect cysts. There is trace layering hypointense material within
one of the cysts likely reflecting a proteinaceous cyst.

Disc levels: Small right foraminal protrusion at L3-L4. Small left
foraminal protrusion at L5-S1 with endplate osteophytic ridging.
Mild facet arthropathy at L4-L5 and L5-S1. There is no canal or
foraminal stenosis at any level.
IMPRESSION: Minor degenerative changes.  No stenosis at any level.

## 2022-06-10 DIAGNOSIS — L309 Dermatitis, unspecified: Secondary | ICD-10-CM | POA: Diagnosis not present

## 2022-06-13 DIAGNOSIS — Z0100 Encounter for examination of eyes and vision without abnormal findings: Secondary | ICD-10-CM | POA: Diagnosis not present

## 2022-07-13 DIAGNOSIS — H9202 Otalgia, left ear: Secondary | ICD-10-CM | POA: Diagnosis not present

## 2022-07-13 DIAGNOSIS — H6590 Unspecified nonsuppurative otitis media, unspecified ear: Secondary | ICD-10-CM | POA: Diagnosis not present

## 2022-07-29 DIAGNOSIS — R5383 Other fatigue: Secondary | ICD-10-CM | POA: Diagnosis not present

## 2022-07-29 DIAGNOSIS — Z8679 Personal history of other diseases of the circulatory system: Secondary | ICD-10-CM | POA: Diagnosis not present

## 2022-07-29 DIAGNOSIS — M7989 Other specified soft tissue disorders: Secondary | ICD-10-CM | POA: Diagnosis not present

## 2022-08-01 DIAGNOSIS — I071 Rheumatic tricuspid insufficiency: Secondary | ICD-10-CM | POA: Diagnosis not present

## 2022-08-16 DIAGNOSIS — R0602 Shortness of breath: Secondary | ICD-10-CM | POA: Diagnosis not present

## 2022-08-16 DIAGNOSIS — E7800 Pure hypercholesterolemia, unspecified: Secondary | ICD-10-CM | POA: Diagnosis not present

## 2022-08-16 DIAGNOSIS — R600 Localized edema: Secondary | ICD-10-CM | POA: Diagnosis not present

## 2022-08-16 DIAGNOSIS — Z8679 Personal history of other diseases of the circulatory system: Secondary | ICD-10-CM | POA: Diagnosis not present

## 2022-08-16 DIAGNOSIS — R0789 Other chest pain: Secondary | ICD-10-CM | POA: Diagnosis not present

## 2022-08-18 ENCOUNTER — Ambulatory Visit
Admission: RE | Admit: 2022-08-18 | Discharge: 2022-08-18 | Disposition: A | Discharge status: Home / Self Care | Payer: Medicare HMO | Source: Ambulatory Visit | Attending: Family Medicine | Admitting: Family Medicine

## 2022-08-18 DIAGNOSIS — Z780 Asymptomatic menopausal state: Secondary | ICD-10-CM | POA: Diagnosis not present

## 2022-08-18 DIAGNOSIS — Z1231 Encounter for screening mammogram for malignant neoplasm of breast: Secondary | ICD-10-CM | POA: Diagnosis not present

## 2022-08-18 DIAGNOSIS — M85851 Other specified disorders of bone density and structure, right thigh: Secondary | ICD-10-CM | POA: Diagnosis not present

## 2022-08-18 DIAGNOSIS — M810 Age-related osteoporosis without current pathological fracture: Secondary | ICD-10-CM

## 2022-08-18 DIAGNOSIS — E2839 Other primary ovarian failure: Secondary | ICD-10-CM

## 2022-08-23 ENCOUNTER — Other Ambulatory Visit: Payer: Self-pay | Admitting: Family Medicine

## 2022-08-23 DIAGNOSIS — R928 Other abnormal and inconclusive findings on diagnostic imaging of breast: Secondary | ICD-10-CM

## 2022-09-01 DIAGNOSIS — R928 Other abnormal and inconclusive findings on diagnostic imaging of breast: Secondary | ICD-10-CM | POA: Diagnosis not present

## 2022-09-01 DIAGNOSIS — E782 Mixed hyperlipidemia: Secondary | ICD-10-CM | POA: Diagnosis not present

## 2022-09-01 DIAGNOSIS — M810 Age-related osteoporosis without current pathological fracture: Secondary | ICD-10-CM | POA: Diagnosis not present

## 2022-09-01 DIAGNOSIS — Z6831 Body mass index (BMI) 31.0-31.9, adult: Secondary | ICD-10-CM | POA: Diagnosis not present

## 2022-09-01 DIAGNOSIS — E559 Vitamin D deficiency, unspecified: Secondary | ICD-10-CM | POA: Diagnosis not present

## 2022-09-08 ENCOUNTER — Ambulatory Visit
Admission: RE | Admit: 2022-09-08 | Discharge: 2022-09-08 | Disposition: A | Discharge status: Home / Self Care | Payer: Medicare HMO | Source: Ambulatory Visit | Attending: Family Medicine | Admitting: Family Medicine

## 2022-09-08 ENCOUNTER — Other Ambulatory Visit: Payer: Self-pay | Admitting: Family Medicine

## 2022-09-08 DIAGNOSIS — R928 Other abnormal and inconclusive findings on diagnostic imaging of breast: Secondary | ICD-10-CM

## 2022-09-08 DIAGNOSIS — N6489 Other specified disorders of breast: Secondary | ICD-10-CM

## 2022-09-08 DIAGNOSIS — R922 Inconclusive mammogram: Secondary | ICD-10-CM | POA: Diagnosis not present

## 2022-09-16 ENCOUNTER — Ambulatory Visit
Admission: RE | Admit: 2022-09-16 | Discharge: 2022-09-16 | Disposition: A | Discharge status: Home / Self Care | Payer: Medicare HMO | Source: Ambulatory Visit | Attending: Family Medicine | Admitting: Family Medicine

## 2022-09-16 DIAGNOSIS — N6489 Other specified disorders of breast: Secondary | ICD-10-CM

## 2022-09-16 DIAGNOSIS — N6011 Diffuse cystic mastopathy of right breast: Secondary | ICD-10-CM | POA: Diagnosis not present

## 2022-09-16 DIAGNOSIS — R928 Other abnormal and inconclusive findings on diagnostic imaging of breast: Secondary | ICD-10-CM | POA: Diagnosis not present

## 2022-09-16 HISTORY — PX: BREAST BIOPSY: SHX20

## 2022-10-09 DIAGNOSIS — J029 Acute pharyngitis, unspecified: Secondary | ICD-10-CM | POA: Diagnosis not present

## 2022-10-24 DIAGNOSIS — Z79899 Other long term (current) drug therapy: Secondary | ICD-10-CM | POA: Diagnosis not present

## 2022-10-24 DIAGNOSIS — M810 Age-related osteoporosis without current pathological fracture: Secondary | ICD-10-CM | POA: Diagnosis not present

## 2022-10-24 DIAGNOSIS — T7840XD Allergy, unspecified, subsequent encounter: Secondary | ICD-10-CM | POA: Diagnosis not present

## 2022-10-24 DIAGNOSIS — Z1330 Encounter for screening examination for mental health and behavioral disorders, unspecified: Secondary | ICD-10-CM | POA: Diagnosis not present

## 2022-10-24 DIAGNOSIS — E785 Hyperlipidemia, unspecified: Secondary | ICD-10-CM | POA: Diagnosis not present

## 2022-10-24 DIAGNOSIS — N6091 Unspecified benign mammary dysplasia of right breast: Secondary | ICD-10-CM | POA: Diagnosis not present

## 2022-10-24 DIAGNOSIS — S32010A Wedge compression fracture of first lumbar vertebra, initial encounter for closed fracture: Secondary | ICD-10-CM | POA: Diagnosis not present

## 2022-10-24 DIAGNOSIS — E039 Hypothyroidism, unspecified: Secondary | ICD-10-CM | POA: Diagnosis not present

## 2022-10-24 DIAGNOSIS — G4700 Insomnia, unspecified: Secondary | ICD-10-CM | POA: Diagnosis not present

## 2022-10-24 DIAGNOSIS — R69 Illness, unspecified: Secondary | ICD-10-CM | POA: Diagnosis not present

## 2022-10-24 DIAGNOSIS — G43009 Migraine without aura, not intractable, without status migrainosus: Secondary | ICD-10-CM | POA: Diagnosis not present

## 2022-10-24 DIAGNOSIS — Z7689 Persons encountering health services in other specified circumstances: Secondary | ICD-10-CM | POA: Diagnosis not present

## 2022-10-24 DIAGNOSIS — E559 Vitamin D deficiency, unspecified: Secondary | ICD-10-CM | POA: Diagnosis not present

## 2022-10-24 DIAGNOSIS — N3281 Overactive bladder: Secondary | ICD-10-CM | POA: Diagnosis not present

## 2022-10-25 DIAGNOSIS — E039 Hypothyroidism, unspecified: Secondary | ICD-10-CM | POA: Diagnosis not present

## 2022-10-25 DIAGNOSIS — E785 Hyperlipidemia, unspecified: Secondary | ICD-10-CM | POA: Diagnosis not present

## 2022-10-26 ENCOUNTER — Other Ambulatory Visit: Payer: Self-pay | Admitting: General Surgery

## 2022-10-26 DIAGNOSIS — N6489 Other specified disorders of breast: Secondary | ICD-10-CM | POA: Diagnosis not present

## 2022-10-31 ENCOUNTER — Other Ambulatory Visit: Payer: Self-pay | Admitting: General Surgery

## 2022-10-31 DIAGNOSIS — N6489 Other specified disorders of breast: Secondary | ICD-10-CM

## 2022-11-15 DIAGNOSIS — M25551 Pain in right hip: Secondary | ICD-10-CM | POA: Diagnosis not present

## 2022-11-24 DIAGNOSIS — M25551 Pain in right hip: Secondary | ICD-10-CM | POA: Diagnosis not present

## 2022-11-28 DIAGNOSIS — M1611 Unilateral primary osteoarthritis, right hip: Secondary | ICD-10-CM | POA: Diagnosis not present

## 2022-11-28 DIAGNOSIS — M7061 Trochanteric bursitis, right hip: Secondary | ICD-10-CM | POA: Diagnosis not present

## 2022-11-30 ENCOUNTER — Encounter (HOSPITAL_BASED_OUTPATIENT_CLINIC_OR_DEPARTMENT_OTHER): Payer: Self-pay | Admitting: General Surgery

## 2022-11-30 ENCOUNTER — Other Ambulatory Visit: Payer: Self-pay

## 2022-11-30 NOTE — Progress Notes (Signed)
chart reviewed by Dr. Roanna Banning and will proceed with surgery as scheduled here at [facility].

## 2022-12-06 ENCOUNTER — Ambulatory Visit
Admission: RE | Admit: 2022-12-06 | Discharge: 2022-12-06 | Disposition: A | Discharge status: Home / Self Care | Payer: Medicare HMO | Source: Ambulatory Visit | Attending: General Surgery | Admitting: General Surgery

## 2022-12-06 DIAGNOSIS — N6489 Other specified disorders of breast: Secondary | ICD-10-CM

## 2022-12-06 DIAGNOSIS — R928 Other abnormal and inconclusive findings on diagnostic imaging of breast: Secondary | ICD-10-CM | POA: Diagnosis not present

## 2022-12-06 HISTORY — PX: BREAST BIOPSY: SHX20

## 2022-12-06 MED ORDER — CHLORHEXIDINE GLUCONATE CLOTH 2 % EX PADS: 6.0000 | MEDICATED_PAD | Freq: Once | CUTANEOUS | Status: DC

## 2022-12-06 MED ORDER — ENSURE PRE-SURGERY PO LIQD: 296.0000 mL | Freq: Once | ORAL | Status: DC

## 2022-12-06 NOTE — Anesthesia Preprocedure Evaluation (Addendum)
Anesthesia Evaluation  Patient identified by MRN, date of birth, ID band Patient awake    Reviewed: Allergy & Precautions, NPO status , Patient's Chart, lab work & pertinent test results  History of Anesthesia Complications (+) history of anesthetic complications (episode of altered consciousness after breast biopsy- etiology unknown)  Airway Mallampati: II  TM Distance: >3 FB Neck ROM: Full    Dental  (+) Teeth Intact, Dental Advisory Given   Pulmonary sleep apnea    Pulmonary exam normal        Cardiovascular hypertension, Pt. on medications Normal cardiovascular exam   Echo 12/13/17: EF 55-60%, g1dd, mild TR, PASP 33   Neuro/Psych  PSYCHIATRIC DISORDERS  Depression    negative neurological ROS     GI/Hepatic negative GI ROS, Neg liver ROS,,,  Endo/Other  Hypothyroidism    Renal/GU Renal diseasenegative Renal ROS  negative genitourinary   Musculoskeletal  (+) Arthritis , Osteoarthritis,    Abdominal   Peds  Hematology negative hematology ROS (+)   Anesthesia Other Findings   Reproductive/Obstetrics                             Anesthesia Physical Anesthesia Plan  ASA: 3  Anesthesia Plan: General   Post-op Pain Management: Tylenol PO (pre-op)*   Induction: Intravenous  PONV Risk Score and Plan: 2 and Treatment may vary due to age or medical condition, Ondansetron and Propofol infusion  Airway Management Planned: LMA  Additional Equipment: None  Intra-op Plan:   Post-operative Plan: Extubation in OR  Informed Consent: I have reviewed the patients History and Physical, chart, labs and discussed the procedure including the risks, benefits and alternatives for the proposed anesthesia with the patient or authorized representative who has indicated his/her understanding and acceptance.       Plan Discussed with: CRNA, Surgeon and Anesthesiologist  Anesthesia Plan Comments:          Anesthesia Quick Evaluation

## 2022-12-06 NOTE — Progress Notes (Signed)

## 2022-12-07 ENCOUNTER — Encounter (HOSPITAL_BASED_OUTPATIENT_CLINIC_OR_DEPARTMENT_OTHER)
Admission: RE | Disposition: A | Discharge status: Home / Self Care | Payer: Self-pay | Source: Home / Self Care | Attending: General Surgery

## 2022-12-07 ENCOUNTER — Other Ambulatory Visit: Payer: Self-pay

## 2022-12-07 ENCOUNTER — Ambulatory Visit (HOSPITAL_BASED_OUTPATIENT_CLINIC_OR_DEPARTMENT_OTHER)
Admission: RE | Admit: 2022-12-07 | Discharge: 2022-12-07 | Disposition: A | Discharge status: Home / Self Care | Payer: Medicare HMO | Attending: General Surgery | Admitting: General Surgery

## 2022-12-07 ENCOUNTER — Ambulatory Visit (HOSPITAL_BASED_OUTPATIENT_CLINIC_OR_DEPARTMENT_OTHER): Payer: Medicare HMO | Admitting: Anesthesiology

## 2022-12-07 ENCOUNTER — Ambulatory Visit
Admission: RE | Admit: 2022-12-07 | Discharge: 2022-12-07 | Disposition: A | Discharge status: Home / Self Care | Payer: Medicare HMO | Source: Ambulatory Visit | Attending: General Surgery | Admitting: General Surgery

## 2022-12-07 ENCOUNTER — Encounter (HOSPITAL_BASED_OUTPATIENT_CLINIC_OR_DEPARTMENT_OTHER): Payer: Self-pay | Admitting: General Surgery

## 2022-12-07 DIAGNOSIS — N6310 Unspecified lump in the right breast, unspecified quadrant: Secondary | ICD-10-CM

## 2022-12-07 DIAGNOSIS — N6011 Diffuse cystic mastopathy of right breast: Secondary | ICD-10-CM | POA: Diagnosis not present

## 2022-12-07 DIAGNOSIS — E039 Hypothyroidism, unspecified: Secondary | ICD-10-CM | POA: Insufficient documentation

## 2022-12-07 DIAGNOSIS — N6489 Other specified disorders of breast: Secondary | ICD-10-CM | POA: Diagnosis not present

## 2022-12-07 DIAGNOSIS — I10 Essential (primary) hypertension: Secondary | ICD-10-CM | POA: Insufficient documentation

## 2022-12-07 DIAGNOSIS — M1990 Unspecified osteoarthritis, unspecified site: Secondary | ICD-10-CM

## 2022-12-07 DIAGNOSIS — G4730 Sleep apnea, unspecified: Secondary | ICD-10-CM | POA: Diagnosis not present

## 2022-12-07 DIAGNOSIS — Z01818 Encounter for other preprocedural examination: Secondary | ICD-10-CM

## 2022-12-07 DIAGNOSIS — N6081 Other benign mammary dysplasias of right breast: Secondary | ICD-10-CM | POA: Diagnosis not present

## 2022-12-07 DIAGNOSIS — N6021 Fibroadenosis of right breast: Secondary | ICD-10-CM | POA: Insufficient documentation

## 2022-12-07 DIAGNOSIS — R928 Other abnormal and inconclusive findings on diagnostic imaging of breast: Secondary | ICD-10-CM | POA: Diagnosis not present

## 2022-12-07 HISTORY — PX: RADIOACTIVE SEED GUIDED EXCISIONAL BREAST BIOPSY: SHX6490

## 2022-12-07 SURGERY — RADIOACTIVE SEED GUIDED BREAST BIOPSY
Anesthesia: General | Site: Breast | Laterality: Right

## 2022-12-07 MED ORDER — PROPOFOL 500 MG/50ML IV EMUL
INTRAVENOUS | Status: DC | PRN
  Administered 2022-12-07: 200 ug/kg/min via INTRAVENOUS

## 2022-12-07 MED ORDER — LIDOCAINE-EPINEPHRINE (PF) 1 %-1:200000 IJ SOLN
INTRAMUSCULAR | Status: AC
  Filled 2022-12-07: qty 90

## 2022-12-07 MED ORDER — ACETAMINOPHEN 500 MG PO TABS
1000.0000 mg | ORAL_TABLET | ORAL | Status: AC
  Administered 2022-12-07: 1000 mg via ORAL

## 2022-12-07 MED ORDER — EPHEDRINE 5 MG/ML INJ
INTRAVENOUS | Status: AC
  Filled 2022-12-07: qty 5

## 2022-12-07 MED ORDER — CIPROFLOXACIN IN D5W 400 MG/200ML IV SOLN
INTRAVENOUS | Status: AC
  Filled 2022-12-07: qty 200

## 2022-12-07 MED ORDER — MIDAZOLAM HCL 2 MG/2ML IJ SOLN
INTRAMUSCULAR | Status: AC
  Filled 2022-12-07: qty 2

## 2022-12-07 MED ORDER — ONDANSETRON HCL 4 MG/2ML IJ SOLN
INTRAMUSCULAR | Status: DC | PRN
  Administered 2022-12-07: 4 mg via INTRAVENOUS

## 2022-12-07 MED ORDER — BUPIVACAINE HCL (PF) 0.25 % IJ SOLN
INTRAMUSCULAR | Status: AC
  Filled 2022-12-07: qty 180

## 2022-12-07 MED ORDER — MEPERIDINE HCL 25 MG/ML IJ SOLN: 6.2500 mg | INTRAMUSCULAR | Status: DC | PRN

## 2022-12-07 MED ORDER — FENTANYL CITRATE (PF) 100 MCG/2ML IJ SOLN
INTRAMUSCULAR | Status: AC
  Filled 2022-12-07: qty 2

## 2022-12-07 MED ORDER — ONDANSETRON HCL 4 MG/2ML IJ SOLN
INTRAMUSCULAR | Status: AC
  Filled 2022-12-07: qty 2

## 2022-12-07 MED ORDER — TRAMADOL HCL 50 MG PO TABS: 50.0000 mg | ORAL_TABLET | Freq: Four times a day (QID) | ORAL | 0 refills | Status: DC | PRN

## 2022-12-07 MED ORDER — CIPROFLOXACIN IN D5W 400 MG/200ML IV SOLN
400.0000 mg | INTRAVENOUS | Status: AC
  Administered 2022-12-07: 400 mg via INTRAVENOUS

## 2022-12-07 MED ORDER — PHENYLEPHRINE 80 MCG/ML (10ML) SYRINGE FOR IV PUSH (FOR BLOOD PRESSURE SUPPORT)
PREFILLED_SYRINGE | INTRAVENOUS | Status: AC
  Filled 2022-12-07: qty 10

## 2022-12-07 MED ORDER — PROPOFOL 10 MG/ML IV BOLUS
INTRAVENOUS | Status: DC | PRN
  Administered 2022-12-07: 100 mg via INTRAVENOUS

## 2022-12-07 MED ORDER — ACETAMINOPHEN 500 MG PO TABS
ORAL_TABLET | ORAL | Status: AC
  Filled 2022-12-07: qty 2

## 2022-12-07 MED ORDER — OXYCODONE HCL 5 MG PO TABS
5.0000 mg | ORAL_TABLET | Freq: Once | ORAL | Status: AC | PRN
  Administered 2022-12-07: 5 mg via ORAL

## 2022-12-07 MED ORDER — ATROPINE SULFATE 0.4 MG/ML IV SOLN
INTRAVENOUS | Status: AC
  Filled 2022-12-07: qty 1

## 2022-12-07 MED ORDER — ONDANSETRON HCL 4 MG/2ML IJ SOLN: 4.0000 mg | Freq: Once | INTRAMUSCULAR | Status: DC | PRN

## 2022-12-07 MED ORDER — ACETAMINOPHEN 325 MG PO TABS: 325.0000 mg | ORAL_TABLET | ORAL | Status: DC | PRN

## 2022-12-07 MED ORDER — OXYCODONE HCL 5 MG PO TABS
ORAL_TABLET | ORAL | Status: AC
  Filled 2022-12-07: qty 1

## 2022-12-07 MED ORDER — LACTATED RINGERS IV SOLN: INTRAVENOUS | Status: DC

## 2022-12-07 MED ORDER — FENTANYL CITRATE (PF) 100 MCG/2ML IJ SOLN
INTRAMUSCULAR | Status: DC | PRN
  Administered 2022-12-07: 50 ug via INTRAVENOUS

## 2022-12-07 MED ORDER — DEXAMETHASONE SODIUM PHOSPHATE 4 MG/ML IJ SOLN
INTRAMUSCULAR | Status: DC | PRN
  Administered 2022-12-07: 5 mg via INTRAVENOUS

## 2022-12-07 MED ORDER — DEXAMETHASONE SODIUM PHOSPHATE 10 MG/ML IJ SOLN
INTRAMUSCULAR | Status: AC
  Filled 2022-12-07: qty 1

## 2022-12-07 MED ORDER — ACETAMINOPHEN 160 MG/5ML PO SOLN: 325.0000 mg | ORAL | Status: DC | PRN

## 2022-12-07 MED ORDER — FENTANYL CITRATE (PF) 100 MCG/2ML IJ SOLN
25.0000 ug | INTRAMUSCULAR | Status: DC | PRN
  Administered 2022-12-07 (×2): 50 ug via INTRAVENOUS

## 2022-12-07 MED ORDER — LIDOCAINE HCL (CARDIAC) PF 100 MG/5ML IV SOSY
PREFILLED_SYRINGE | INTRAVENOUS | Status: DC | PRN
  Administered 2022-12-07: 20 mg via INTRAVENOUS

## 2022-12-07 MED ORDER — LIDOCAINE 2% (20 MG/ML) 5 ML SYRINGE
INTRAMUSCULAR | Status: AC
  Filled 2022-12-07: qty 5

## 2022-12-07 MED ORDER — SUCCINYLCHOLINE CHLORIDE 200 MG/10ML IV SOSY
PREFILLED_SYRINGE | INTRAVENOUS | Status: AC
  Filled 2022-12-07: qty 10

## 2022-12-07 MED ORDER — OXYCODONE HCL 5 MG/5ML PO SOLN: 5.0000 mg | Freq: Once | ORAL | Status: AC | PRN

## 2022-12-07 MED ORDER — BUPIVACAINE HCL (PF) 0.25 % IJ SOLN
INTRAMUSCULAR | Status: DC | PRN
  Administered 2022-12-07: 5 mL

## 2022-12-07 SURGICAL SUPPLY — 54 items
ADH SKN CLS APL DERMABOND .7 (GAUZE/BANDAGES/DRESSINGS) ×1
APL PRP STRL LF DISP 70% ISPRP (MISCELLANEOUS) ×1
APPLIER CLIP 9.375 MED OPEN (MISCELLANEOUS)
APR CLP MED 9.3 20 MLT OPN (MISCELLANEOUS)
BINDER BREAST LRG (GAUZE/BANDAGES/DRESSINGS) IMPLANT
BINDER BREAST MEDIUM (GAUZE/BANDAGES/DRESSINGS) IMPLANT
BINDER BREAST XLRG (GAUZE/BANDAGES/DRESSINGS) IMPLANT
BINDER BREAST XXLRG (GAUZE/BANDAGES/DRESSINGS) IMPLANT
BLADE SURG 15 STRL LF DISP TIS (BLADE) ×1 IMPLANT
BLADE SURG 15 STRL SS (BLADE) ×1
CANISTER SUC SOCK COL 7IN (MISCELLANEOUS) IMPLANT
CANISTER SUCT 1200ML W/VALVE (MISCELLANEOUS) IMPLANT
CHLORAPREP W/TINT 26 (MISCELLANEOUS) ×1 IMPLANT
CLIP APPLIE 9.375 MED OPEN (MISCELLANEOUS) IMPLANT
CLIP TI WIDE RED SMALL 6 (CLIP) IMPLANT
COVER BACK TABLE 60X90IN (DRAPES) ×1 IMPLANT
COVER MAYO STAND STRL (DRAPES) ×1 IMPLANT
COVER PROBE CYLINDRICAL 5X96 (MISCELLANEOUS) ×1 IMPLANT
DERMABOND ADVANCED .7 DNX12 (GAUZE/BANDAGES/DRESSINGS) ×1 IMPLANT
DRAPE LAPAROSCOPIC ABDOMINAL (DRAPES) ×1 IMPLANT
DRAPE UTILITY XL STRL (DRAPES) ×1 IMPLANT
DRSG TEGADERM 4X4.75 (GAUZE/BANDAGES/DRESSINGS) IMPLANT
ELECT COATED BLADE 2.86 ST (ELECTRODE) ×1 IMPLANT
ELECT REM PT RETURN 9FT ADLT (ELECTROSURGICAL) ×1
ELECTRODE REM PT RTRN 9FT ADLT (ELECTROSURGICAL) ×1 IMPLANT
GAUZE SPONGE 4X4 12PLY STRL LF (GAUZE/BANDAGES/DRESSINGS) IMPLANT
GLOVE BIO SURGEON STRL SZ7 (GLOVE) ×2 IMPLANT
GLOVE BIOGEL PI IND STRL 7.5 (GLOVE) ×1 IMPLANT
GOWN STRL REUS W/ TWL LRG LVL3 (GOWN DISPOSABLE) ×2 IMPLANT
GOWN STRL REUS W/TWL LRG LVL3 (GOWN DISPOSABLE) ×2
HEMOSTAT ARISTA ABSORB 3G PWDR (HEMOSTASIS) IMPLANT
KIT MARKER MARGIN INK (KITS) ×1 IMPLANT
NDL HYPO 25X1 1.5 SAFETY (NEEDLE) ×1 IMPLANT
NEEDLE HYPO 25X1 1.5 SAFETY (NEEDLE) ×1 IMPLANT
NS IRRIG 1000ML POUR BTL (IV SOLUTION) IMPLANT
PACK BASIN DAY SURGERY FS (CUSTOM PROCEDURE TRAY) ×1 IMPLANT
PENCIL SMOKE EVACUATOR (MISCELLANEOUS) ×1 IMPLANT
RETRACTOR ONETRAX LX 90X20 (MISCELLANEOUS) IMPLANT
SLEEVE SCD COMPRESS KNEE MED (STOCKING) ×1 IMPLANT
SPIKE FLUID TRANSFER (MISCELLANEOUS) IMPLANT
SPONGE T-LAP 4X18 ~~LOC~~+RFID (SPONGE) ×1 IMPLANT
STRIP CLOSURE SKIN 1/2X4 (GAUZE/BANDAGES/DRESSINGS) ×1 IMPLANT
SUT MNCRL AB 4-0 PS2 18 (SUTURE) IMPLANT
SUT MON AB 5-0 PS2 18 (SUTURE) IMPLANT
SUT SILK 2 0 SH (SUTURE) IMPLANT
SUT VIC AB 2-0 SH 27 (SUTURE) ×1
SUT VIC AB 2-0 SH 27XBRD (SUTURE) ×1 IMPLANT
SUT VIC AB 3-0 SH 27 (SUTURE) ×1
SUT VIC AB 3-0 SH 27X BRD (SUTURE) ×1 IMPLANT
SYR CONTROL 10ML LL (SYRINGE) ×1 IMPLANT
TOWEL GREEN STERILE FF (TOWEL DISPOSABLE) ×1 IMPLANT
TRAY FAXITRON CT DISP (TRAY / TRAY PROCEDURE) ×1 IMPLANT
TUBE CONNECTING 20X1/4 (TUBING) IMPLANT
YANKAUER SUCT BULB TIP NO VENT (SUCTIONS) IMPLANT

## 2022-12-07 NOTE — Transfer of Care (Signed)
Immediate Anesthesia Transfer of Care Note  Patient: Rebecca Steele  Procedure(s) Performed: RADIOACTIVE SEED GUIDED EXCISIONAL RIGHT BREAST BIOPSY (Right: Breast)  Patient Location: PACU  Anesthesia Type:General  Level of Consciousness: sedated  Airway & Oxygen Therapy: Patient Spontanous Breathing and Patient connected to face mask oxygen  Post-op Assessment: Report given to RN and Post -op Vital signs reviewed and stable  Post vital signs: Reviewed and stable  Last Vitals:  Vitals Value Taken Time  BP 82/55 12/07/22 0905  Temp    Pulse 76 12/07/22 0906  Resp 13 12/07/22 0906  SpO2 95 % 12/07/22 0906  Vitals shown include unvalidated device data.  Last Pain:  Vitals:   12/07/22 0735  TempSrc: Oral  PainSc: 0-No pain      Patients Stated Pain Goal: 3 [phone]  Complications: No notable events documented.

## 2022-12-07 NOTE — Interval H&P Note (Signed)
History and Physical Interval Note:  12/07/2022 8:03 AM  Rebecca Steele  has presented today for surgery, with the diagnosis of RIGHT BREAST MASS.  The various methods of treatment have been discussed with the patient and family. After consideration of risks, benefits and other options for treatment, the patient has consented to  Procedure(s): RADIOACTIVE SEED GUIDED EXCISIONAL RIGHT BREAST BIOPSY (Right) as a surgical intervention.  The patient's history has been reviewed, patient examined, no change in status, stable for surgery.  I have reviewed the patient's chart and labs.  Questions were answered to the patient's satisfaction.     Rolm Bookbinder

## 2022-12-07 NOTE — Anesthesia Postprocedure Evaluation (Signed)
Anesthesia Post Note  Patient: Rebecca Steele  Procedure(s) Performed: RADIOACTIVE SEED GUIDED EXCISIONAL RIGHT BREAST BIOPSY (Right: Breast)     Patient location during evaluation: PACU Anesthesia Type: General Level of consciousness: awake and alert Pain management: pain level controlled Vital Signs Assessment: post-procedure vital signs reviewed and stable Respiratory status: spontaneous breathing, nonlabored ventilation, respiratory function stable and patient connected to nasal cannula oxygen Cardiovascular status: blood pressure returned to baseline and stable Postop Assessment: no apparent nausea or vomiting Anesthetic complications: no  No notable events documented.  Last Vitals:  Vitals:   12/07/22 1015 12/07/22 1040  BP: (!) 132/97 (!) 159/78  Pulse: 72 85  Resp: (!) 8 16  Temp:  (!) 36.1 C  SpO2:  96%    Last Pain:  Vitals:   12/07/22 1040  TempSrc: Oral  PainSc: 7                  Chidera Dearcos

## 2022-12-07 NOTE — H&P (Signed)
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 This was complicated by an admission for observation. It ends up this is probably all related to the combination of a preop gabapentin dose and general anesthesia. She is doing well now. She has no mass or discharge. She underwent a screening mammogram that shows C density breast tissue. She had a possible distortion in the right breast. On further views the subtle distortion did persist. There is no ultrasound correlate. This was biopsied and an X clip placed. This ends up being fragments of a complex sclerosing lesion. She is recommended for consideration of excision. She is here with her husband today.  Review of Systems: A complete review of systems was obtained from the patient. I have reviewed this information and discussed as appropriate with the patient. See HPI as well for other ROS.  Review of Systems Cardiovascular: Positive for leg swelling (ankle). All other systems reviewed and are negative.   Medical History: Past Medical History: Diagnosis Date Anxiety Hypertension Thyroid disease  There is no problem list on file for this patient.  Past Surgical History: Procedure Laterality Date APPENDECTOMY ARTHROPLASTY HIP TOTAL BREAST EXCISIONAL BIOPSY Right CHOLECYSTECTOMY KYPHOPLASTY Radioactive seed guided excisional breast biopsy TONSILLECTOMY   Allergies Allergen Reactions Diazepam Anaphylaxis, Palpitations, Other (See Comments) and Shortness Of Breath Gadolinium-Containing Contrast Media Anaphylaxis, Other (See Comments), Shortness Of Breath and Unknown Pt was given 75m multihance for MRI. After about 114m30 seconds she squeezed the ball saying she felt like her throat was closing up. Exam was DC'd and radiologist gave '75mg'$  benadryl IV. Desc: Pt. stated she had iv contrast around 25 yrs ago and had itching on her neck. she took benadryl  and it went away. The rad recommended premeds and be done tomorrow but the pt.did not want to do that so we did her w/o iv contrast today., Onset Date: [id number] states she was given Gabapentin and Fentanyl for anesthesia during recent surgery and was unable to wake up for several hours. Pt states she does not want to have sedation with those medications in the future. Doxycycline Swelling Facial swelling Doxycycline Calcium Swelling Gabapentin Hives and Other (See Comments) Nirmatrelvir-Ritonavir Hives Nitrofurantoin Nausea And Vomiting and Vomiting Red Dye Swelling Facial swelling (cannot take pink or red tablets or capsules)  Current Outpatient Medications on File Prior to Visit Medication Sig Dispense Refill albuterol 90 mcg/actuation inhaler INHALE 1-2 PUFFS EVERY 6 HOURS AS NEEDED aspirin 325 MG EC tablet Take by mouth oxyBUTYnin (DITROPAN-XL) 5 MG XL tablet Take by mouth levothyroxine (SYNTHROID) 75 MCG tablet TAKE 1 TABLET BY MOUTH EVERY DAY IN THE MORNING ON EMPTY STOMACH loratadine (CLARITIN) 10 mg tablet Take by mouth montelukast (SINGULAIR) 10 mg tablet Take 1 tablet by mouth once daily  Family History Problem Relation Age of Onset Skin cancer Mother High blood pressure (Hypertension) Mother Hyperlipidemia (Elevated cholesterol) Mother Heart valve disease Father High blood pressure (Hypertension) Father Hyperlipidemia (Elevated cholesterol) Father Diabetes Father  Social History  Tobacco Use Smoking Status Never Smokeless Tobacco Never Marital status: Married Tobacco Use Smoking status: Never Smokeless tobacco: Never Substance and Sexual Activity Alcohol use: Never Drug use: Never  Objective:  Vitals: 10/26/22 1345 BP: 122/76 Pulse: 80 Temp: (!) 35.8 C (96.4 F) SpO2: 98% Weight: 88.9 kg (196 lb) Height: 168.9 cm (5' 6.5")  Body mass index is 31.16 kg/m.  Physical Exam Vitals reviewed. Constitutional: Appearance:  Normal  appearance. Chest: Breasts: Right: No inverted nipple, mass or nipple discharge. Left: No inverted nipple, mass or nipple discharge. Lymphadenopathy: Upper Body: Right upper body: No supraclavicular or axillary adenopathy. Left upper body: No supraclavicular or axillary adenopathy. Neurological: Mental Status: She is alert.  Assessment and Plan:  Radial scar of breast  Right breast radioactive seed guided excisional biopsy  We discussed the options including observation with 5-monthfollow-up. The downside of that is that this is a distortion and is hard to follow-up as there is not a discrete measurement. I think the likelihood of there being something additional either atypia or even DCIS is small. The only way to figure this out for sure would be able to do an excision. I think we have figured out why she had such a significant episode last time and I certainly will avoid gabapentin at this time. We discussed a seed guided excisional biopsy with the risks and recovery associated with that.

## 2022-12-07 NOTE — Discharge Instructions (Addendum)
[place] Office Phone Number [phone]  POST OP INSTRUCTIONS Take 400 mg of ibuprofen every 8 hours or 650 mg tylenol every 6 hours for next 72 hours then as needed. Use ice several times daily also.  A prescription for pain medication may be given to you upon discharge.  Take your pain medication as prescribed, if needed.  If narcotic pain medicine is not needed, then you may take acetaminophen (Tylenol), naprosyn (Alleve) or ibuprofen (Advil) as needed. Take your usually prescribed medications unless otherwise directed If you need a refill on your pain medication, please contact your pharmacy.  They will contact our office to request authorization.  Prescriptions will not be filled after 5pm or on week-ends. You should eat very light the first 24 hours after surgery, such as soup, crackers, pudding, etc.  Resume your normal diet the day after surgery. Most patients will experience some swelling and bruising in the breast.  Ice packs and a good support bra will help.  Wear the breast binder provided or a sports bra for 72 hours day and night.  After that wear a sports bra during the day until you return to the office. Swelling and bruising can take several days to resolve.  It is common to experience some constipation if taking pain medication after surgery.  Increasing fluid intake and taking a stool softener will usually help or prevent this problem from occurring.  A mild laxative (Milk of Magnesia or Miralax) should be taken according to package directions if there are no bowel movements after 48 hours. I used skin glue on the incision, you may shower in 24 hours.  The glue will flake off over the next 2-3 weeks.  Any sutures or staples will be removed at the office during your follow-up visit. ACTIVITIES:  You may resume regular daily activities (gradually increasing) beginning the next day.  Wearing a good support bra or sports bra minimizes pain and swelling.  You may have  sexual intercourse when it is comfortable. You may drive when you no longer are taking prescription pain medication, you can comfortably wear a seatbelt, and you can safely maneuver your car and apply brakes. RETURN TO WORK:  ______________________________________________________________________________________ Dennis Bast should see your doctor in the office for a follow-up appointment approximately two weeks after your surgery.  Your doctor's nurse will typically make your follow-up appointment when she calls you with your pathology report.  Expect your pathology report 3-4 business days after your surgery.  You may call to check if you do not hear from [place] after three days. OTHER INSTRUCTIONS: _______________________________________________________________________________________________ _____________________________________________________________________________________________________________________________________ _____________________________________________________________________________________________________________________________________ _____________________________________________________________________________________________________________________________________  WHEN TO CALL DR WAKEFIELD: Fever over 101.0 Nausea and/or vomiting. Extreme swelling or bruising. Continued bleeding from incision. Increased pain, redness, or drainage from the incision.  The clinic staff is available to answer your questions during regular business hours.  Please don't hesitate to call and ask to speak to one of the nurses for clinical concerns.  If you have a medical emergency, go to the nearest emergency room or call 911.  A surgeon from [facility] is always on call at the hospital.  For further questions, please visit [email or web address]    Post Anesthesia Home Care Instructions  Activity: Get plenty of rest for the remainder of the day. A responsible individual must stay  with you for 24 hours following the procedure.  For the next 24 hours, DO NOT: -Drive a car -Paediatric nurse -Drink alcoholic beverages -Take any medication unless instructed  by your physician -Make any legal decisions or sign important papers.  Meals: Start with liquid foods such as gelatin or soup. Progress to regular foods as tolerated. Avoid greasy, spicy, heavy foods. If nausea and/or vomiting occur, drink only clear liquids until the nausea and/or vomiting subsides. Call your physician if vomiting continues.  Special Instructions/Symptoms: Your throat may feel dry or sore from the anesthesia or the breathing tube placed in your throat during surgery. If this causes discomfort, gargle with warm salt water. The discomfort should disappear within 24 hours.  If you had a scopolamine patch placed behind your ear for the management of post- operative nausea and/or vomiting:  1. The medication in the patch is effective for 72 hours, after which it should be removed.  Wrap patch in a tissue and discard in the trash. Wash hands thoroughly with soap and water. 2. You may remove the patch earlier than 72 hours if you experience unpleasant side effects which may include dry mouth, dizziness or visual disturbances. 3. Avoid touching the patch. Wash your hands with soap and water after contact with the patch.    No tylenol until after 1:45pm today if needed.

## 2022-12-07 NOTE — Anesthesia Procedure Notes (Signed)
Procedure Name: LMA Insertion Date/Time: 12/07/2022 8:22 AM  Performed by: Willa Frater, CRNAPre-anesthesia Checklist: Patient identified, Emergency Drugs available, Suction available and Patient being monitored Patient Re-evaluated:Patient Re-evaluated prior to induction Oxygen Delivery Method: Circle system utilized Preoxygenation: Pre-oxygenation with 100% oxygen Induction Type: IV induction Ventilation: Mask ventilation without difficulty LMA: LMA inserted LMA Size: 4.0 Number of attempts: 1 Airway Equipment and Method: Bite block Placement Confirmation: positive ETCO2 Tube secured with: Tape Dental Injury: Teeth and Oropharynx as per pre-operative assessment

## 2022-12-07 NOTE — Op Note (Signed)
Preoperative diagnosis: Right breast mass with core biopsy showing CSL Postoperative diagnosis: Same as above Procedure: Right breast radioactive seed guided excisional biopsy Surgeon: Dr. 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 This was complicated by an admission for observation. It ends up this is probably all related to the combination of a preop gabapentin dose and general anesthesia. She has no mass or discharge. She underwent a screening mammogram that shows C density breast tissue. She had a possible distortion in the right breast. There is no ultrasound correlate. This was biopsied and an X clip placed. This ends up being fragments of a complex sclerosing lesion. She is recommended for consideration of excision. We discussed options and elected to proceed with excisional biopsy. I discussed with anesthesia prior to surgery.    Procedure: After informed consent was obtained she was taken to the operating room She had a radioactive seed placed prior to this and had these mammograms in the operating room.  She was given antibiotics.  SCDs were placed.  She was placed under general anesthesia without complication.  She was prepped and draped in sterile sterile surgical fashion.  Surgical timeout was then performed.   I made an inframammary incision to hide the scar later.  I dissected to the seed in the lower outer breast. I then removed the seed and surrounding tissue. This was then marked with paint.  I then did a mammogram confirmed removal of the seed and the clip.  Hemostasis was obtained.  I then closed this with 2-0 Vicryl, 3-0 Vicryl,  and 4-0 Monocryl.  Glue Steri-Strips applied.  She tolerated this well was extubated transferred to recovery stable

## 2022-12-08 ENCOUNTER — Encounter (HOSPITAL_BASED_OUTPATIENT_CLINIC_OR_DEPARTMENT_OTHER): Payer: Self-pay | Admitting: General Surgery

## 2022-12-12 LAB — SURGICAL PATHOLOGY

## 2022-12-20 DIAGNOSIS — M25551 Pain in right hip: Secondary | ICD-10-CM | POA: Diagnosis not present

## 2022-12-21 DIAGNOSIS — J019 Acute sinusitis, unspecified: Secondary | ICD-10-CM | POA: Diagnosis not present

## 2022-12-29 DIAGNOSIS — J329 Chronic sinusitis, unspecified: Secondary | ICD-10-CM | POA: Diagnosis not present

## 2022-12-29 DIAGNOSIS — R0981 Nasal congestion: Secondary | ICD-10-CM | POA: Diagnosis not present

## 2023-01-05 ENCOUNTER — Encounter (HOSPITAL_COMMUNITY): Payer: Self-pay

## 2023-01-30 DIAGNOSIS — M1990 Unspecified osteoarthritis, unspecified site: Secondary | ICD-10-CM | POA: Diagnosis not present

## 2023-01-30 DIAGNOSIS — G4700 Insomnia, unspecified: Secondary | ICD-10-CM | POA: Diagnosis not present

## 2023-01-30 DIAGNOSIS — J302 Other seasonal allergic rhinitis: Secondary | ICD-10-CM | POA: Diagnosis not present

## 2023-01-30 DIAGNOSIS — E669 Obesity, unspecified: Secondary | ICD-10-CM | POA: Diagnosis not present

## 2023-01-30 DIAGNOSIS — E039 Hypothyroidism, unspecified: Secondary | ICD-10-CM | POA: Diagnosis not present

## 2023-01-30 DIAGNOSIS — M545 Low back pain, unspecified: Secondary | ICD-10-CM | POA: Diagnosis not present

## 2023-01-30 DIAGNOSIS — R030 Elevated blood-pressure reading, without diagnosis of hypertension: Secondary | ICD-10-CM | POA: Diagnosis not present

## 2023-01-30 DIAGNOSIS — G4733 Obstructive sleep apnea (adult) (pediatric): Secondary | ICD-10-CM | POA: Diagnosis not present

## 2023-01-30 DIAGNOSIS — F324 Major depressive disorder, single episode, in partial remission: Secondary | ICD-10-CM | POA: Diagnosis not present

## 2023-01-30 DIAGNOSIS — F411 Generalized anxiety disorder: Secondary | ICD-10-CM | POA: Diagnosis not present

## 2023-01-30 DIAGNOSIS — E785 Hyperlipidemia, unspecified: Secondary | ICD-10-CM | POA: Diagnosis not present

## 2023-01-30 DIAGNOSIS — R32 Unspecified urinary incontinence: Secondary | ICD-10-CM | POA: Diagnosis not present

## 2023-03-27 DIAGNOSIS — R051 Acute cough: Secondary | ICD-10-CM | POA: Diagnosis not present

## 2023-03-27 DIAGNOSIS — J019 Acute sinusitis, unspecified: Secondary | ICD-10-CM | POA: Diagnosis not present

## 2023-03-27 DIAGNOSIS — L739 Follicular disorder, unspecified: Secondary | ICD-10-CM | POA: Diagnosis not present

## 2023-04-24 DIAGNOSIS — R739 Hyperglycemia, unspecified: Secondary | ICD-10-CM | POA: Diagnosis not present

## 2023-04-24 DIAGNOSIS — R053 Chronic cough: Secondary | ICD-10-CM | POA: Diagnosis not present

## 2023-04-24 DIAGNOSIS — Z79899 Other long term (current) drug therapy: Secondary | ICD-10-CM | POA: Diagnosis not present

## 2023-04-24 DIAGNOSIS — Z0000 Encounter for general adult medical examination without abnormal findings: Secondary | ICD-10-CM | POA: Diagnosis not present

## 2023-04-28 DIAGNOSIS — Z79899 Other long term (current) drug therapy: Secondary | ICD-10-CM | POA: Diagnosis not present

## 2023-04-28 DIAGNOSIS — R739 Hyperglycemia, unspecified: Secondary | ICD-10-CM | POA: Diagnosis not present

## 2023-04-28 DIAGNOSIS — Z0000 Encounter for general adult medical examination without abnormal findings: Secondary | ICD-10-CM | POA: Diagnosis not present

## 2023-05-11 DIAGNOSIS — H2513 Age-related nuclear cataract, bilateral: Secondary | ICD-10-CM | POA: Diagnosis not present

## 2023-09-05 DIAGNOSIS — N6322 Unspecified lump in the left breast, upper inner quadrant: Secondary | ICD-10-CM | POA: Diagnosis not present

## 2023-09-14 DIAGNOSIS — F419 Anxiety disorder, unspecified: Secondary | ICD-10-CM | POA: Diagnosis not present

## 2023-09-22 DIAGNOSIS — R590 Localized enlarged lymph nodes: Secondary | ICD-10-CM | POA: Diagnosis not present

## 2023-09-22 DIAGNOSIS — R928 Other abnormal and inconclusive findings on diagnostic imaging of breast: Secondary | ICD-10-CM | POA: Diagnosis not present

## 2023-09-22 DIAGNOSIS — R92333 Mammographic heterogeneous density, bilateral breasts: Secondary | ICD-10-CM | POA: Diagnosis not present

## 2023-09-22 DIAGNOSIS — N6325 Unspecified lump in the left breast, overlapping quadrants: Secondary | ICD-10-CM | POA: Diagnosis not present

## 2023-09-29 DIAGNOSIS — N6322 Unspecified lump in the left breast, upper inner quadrant: Secondary | ICD-10-CM | POA: Diagnosis not present

## 2023-09-29 DIAGNOSIS — Z171 Estrogen receptor negative status [ER-]: Secondary | ICD-10-CM | POA: Diagnosis not present

## 2023-09-29 DIAGNOSIS — C50812 Malignant neoplasm of overlapping sites of left female breast: Secondary | ICD-10-CM | POA: Diagnosis not present

## 2023-09-29 DIAGNOSIS — R590 Localized enlarged lymph nodes: Secondary | ICD-10-CM | POA: Diagnosis not present

## 2023-09-29 DIAGNOSIS — Z17421 Hormone receptor negative with human epidermal growth factor receptor 2 negative status: Secondary | ICD-10-CM | POA: Diagnosis not present

## 2023-09-29 DIAGNOSIS — C773 Secondary and unspecified malignant neoplasm of axilla and upper limb lymph nodes: Secondary | ICD-10-CM | POA: Diagnosis not present

## 2023-09-29 DIAGNOSIS — N6321 Unspecified lump in the left breast, upper outer quadrant: Secondary | ICD-10-CM | POA: Diagnosis not present

## 2023-09-29 DIAGNOSIS — Z1722 Progesterone receptor negative status: Secondary | ICD-10-CM | POA: Diagnosis not present

## 2023-09-29 DIAGNOSIS — C50112 Malignant neoplasm of central portion of left female breast: Secondary | ICD-10-CM | POA: Diagnosis not present

## 2023-09-29 DIAGNOSIS — R92332 Mammographic heterogeneous density, left breast: Secondary | ICD-10-CM | POA: Diagnosis not present

## 2023-10-03 DIAGNOSIS — C50912 Malignant neoplasm of unspecified site of left female breast: Secondary | ICD-10-CM | POA: Diagnosis not present

## 2023-10-03 DIAGNOSIS — E785 Hyperlipidemia, unspecified: Secondary | ICD-10-CM | POA: Diagnosis not present

## 2023-10-04 DIAGNOSIS — C801 Malignant (primary) neoplasm, unspecified: Secondary | ICD-10-CM

## 2023-10-04 HISTORY — DX: Malignant (primary) neoplasm, unspecified: C80.1

## 2023-10-05 ENCOUNTER — Other Ambulatory Visit: Payer: Self-pay | Admitting: General Surgery

## 2023-10-06 ENCOUNTER — Encounter (HOSPITAL_BASED_OUTPATIENT_CLINIC_OR_DEPARTMENT_OTHER): Payer: Self-pay | Admitting: General Surgery

## 2023-10-06 ENCOUNTER — Other Ambulatory Visit: Payer: Self-pay

## 2023-10-09 ENCOUNTER — Inpatient Hospital Stay (HOSPITAL_BASED_OUTPATIENT_CLINIC_OR_DEPARTMENT_OTHER): Payer: PPO | Admitting: Hematology and Oncology

## 2023-10-09 ENCOUNTER — Inpatient Hospital Stay: Payer: PPO | Attending: Hematology and Oncology

## 2023-10-09 ENCOUNTER — Other Ambulatory Visit: Payer: Self-pay | Admitting: *Deleted

## 2023-10-09 VITALS — BP 145/78 | HR 91 | Temp 97.9°F | Resp 17 | Wt 198.0 lb

## 2023-10-09 DIAGNOSIS — Z79899 Other long term (current) drug therapy: Secondary | ICD-10-CM | POA: Diagnosis not present

## 2023-10-09 DIAGNOSIS — C50212 Malignant neoplasm of upper-inner quadrant of left female breast: Secondary | ICD-10-CM | POA: Diagnosis present

## 2023-10-09 DIAGNOSIS — F32A Depression, unspecified: Secondary | ICD-10-CM | POA: Diagnosis not present

## 2023-10-09 DIAGNOSIS — Z5111 Encounter for antineoplastic chemotherapy: Secondary | ICD-10-CM | POA: Insufficient documentation

## 2023-10-09 DIAGNOSIS — Z1722 Progesterone receptor negative status: Secondary | ICD-10-CM | POA: Diagnosis not present

## 2023-10-09 DIAGNOSIS — K521 Toxic gastroenteritis and colitis: Secondary | ICD-10-CM | POA: Diagnosis not present

## 2023-10-09 DIAGNOSIS — T451X5A Adverse effect of antineoplastic and immunosuppressive drugs, initial encounter: Secondary | ICD-10-CM | POA: Diagnosis not present

## 2023-10-09 DIAGNOSIS — Z171 Estrogen receptor negative status [ER-]: Secondary | ICD-10-CM | POA: Insufficient documentation

## 2023-10-09 DIAGNOSIS — Z5112 Encounter for antineoplastic immunotherapy: Secondary | ICD-10-CM | POA: Insufficient documentation

## 2023-10-09 DIAGNOSIS — Z5189 Encounter for other specified aftercare: Secondary | ICD-10-CM | POA: Insufficient documentation

## 2023-10-09 DIAGNOSIS — K1231 Oral mucositis (ulcerative) due to antineoplastic therapy: Secondary | ICD-10-CM | POA: Diagnosis not present

## 2023-10-09 DIAGNOSIS — Z1731 Human epidermal growth factor receptor 2 positive status: Secondary | ICD-10-CM | POA: Diagnosis not present

## 2023-10-09 DIAGNOSIS — M898X9 Other specified disorders of bone, unspecified site: Secondary | ICD-10-CM | POA: Diagnosis not present

## 2023-10-09 DIAGNOSIS — C50219 Malignant neoplasm of upper-inner quadrant of unspecified female breast: Secondary | ICD-10-CM | POA: Insufficient documentation

## 2023-10-09 DIAGNOSIS — R09A2 Foreign body sensation, throat: Secondary | ICD-10-CM | POA: Insufficient documentation

## 2023-10-09 LAB — CMP (CANCER CENTER ONLY)
ALT: 19 U/L (ref 0–44)
AST: 16 U/L (ref 15–41)
Albumin: 4.4 g/dL (ref 3.5–5.0)
Alkaline Phosphatase: 83 U/L (ref 38–126)
Anion gap: 5 (ref 5–15)
BUN: 15 mg/dL (ref 8–23)
CO2: 28 mmol/L (ref 22–32)
Calcium: 9.7 mg/dL (ref 8.9–10.3)
Chloride: 106 mmol/L (ref 98–111)
Creatinine: 0.97 mg/dL (ref 0.44–1.00)
GFR, Estimated: >60 mL/min (ref >60)
Glucose, Bld: 102 mg/dL — ABNORMAL HIGH (ref 70–99)
Potassium: 3.9 mmol/L (ref 3.5–5.1)
Sodium: 139 mmol/L (ref 135–145)
Total Bilirubin: 0.5 mg/dL (ref 0.0–1.2)
Total Protein: 6.7 g/dL (ref 6.5–8.1)

## 2023-10-09 LAB — CBC WITH DIFFERENTIAL (CANCER CENTER ONLY)
Abs Immature Granulocytes: 0.02 10*3/uL (ref 0.00–0.07)
Basophils Absolute: 0.1 10*3/uL (ref 0.0–0.1)
Basophils Relative: 2 %
Eosinophils Absolute: 0.2 10*3/uL (ref 0.0–0.5)
Eosinophils Relative: 2 %
HCT: 46.4 % — ABNORMAL HIGH (ref 36.0–46.0)
Hemoglobin: 16.2 g/dL — ABNORMAL HIGH (ref 12.0–15.0)
Immature Granulocytes: 0 %
Lymphocytes Relative: 24 %
Lymphs Abs: 1.7 10*3/uL (ref 0.7–4.0)
MCH: 32.3 pg (ref 26.0–34.0)
MCHC: 34.9 g/dL (ref 30.0–36.0)
MCV: 92.4 fL (ref 80.0–100.0)
Monocytes Absolute: 0.7 10*3/uL (ref 0.1–1.0)
Monocytes Relative: 10 %
Neutro Abs: 4.3 10*3/uL (ref 1.7–7.7)
Neutrophils Relative %: 62 %
Platelet Count: 229 10*3/uL (ref 150–400)
RBC: 5.02 MIL/uL (ref 3.87–5.11)
RDW: 13.3 % (ref 11.5–15.5)
WBC Count: 6.9 10*3/uL (ref 4.0–10.5)
nRBC: 0 % (ref 0.0–0.2)

## 2023-10-09 MED ORDER — CHLORHEXIDINE GLUCONATE CLOTH 2 % EX PADS: 6.0000 | MEDICATED_PAD | Freq: Once | CUTANEOUS | Status: DC

## 2023-10-09 MED ORDER — ENSURE PRE-SURGERY PO LIQD: 296.0000 mL | Freq: Once | ORAL | Status: DC

## 2023-10-09 NOTE — Progress Notes (Signed)

## 2023-10-09 NOTE — Progress Notes (Signed)
 [facility] CONSULT NOTE  Patient Care Team: Knox Toribio NOVAK., MD as PCP - General (Internal Medicine) Ebbie Cough, MD as Consulting Physician (General Surgery) Curlene Agent, MD as Consulting Physician (Obstetrics and Gynecology) Dyane Rush, MD (Inactive) as Consulting Physician (Gastroenterology) Loretha Ash, MD as Consulting Physician (Hematology and Oncology) Glean Stephane BROCKS, RN as Oncology Nurse Navigator Tyree Nanetta SAILOR, RN as Oncology Nurse Navigator  CHIEF COMPLAINTS/PURPOSE OF CONSULTATION:  Newly diagnosed breast cancer  HISTORY OF PRESENTING ILLNESS:  Rebecca Steele 73 y.o. female is here because of recent diagnosis of left breast cancer  I reviewed her records extensively and collaborated the history with the patient.  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)  09/22/2023 Mammogram   Patient self palpated a breast mass in her left breast around November 2019 went for diagnostic mammogram, this showed there is a developing density on mammogram, 2 of the lower left axillary lymph nodes exhibit increased cortical thickness, ultrasound confirmed a 2.7 cm irregular hypoechoic mass in left breast at 12:00 5 cm from the nipple along with abnormal left axillary lymph   09/29/2023 Pathology Results   Left breast central superior needle core biopsy 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  She felt this lump about 2 months ago and felt like it increased in size in past 2 months. There is some family history of breast cancer on paternal side, no family history of breast cancer on maternal side.  At baseline she stays active, has high blood pressure which is well-controlled, participates in the choir at church, keeps up with 4 grandchildren.  She denies any pain associated with the breast lump.  Rest of the pertinent 10 point ROS reviewed and negative  MEDICAL HISTORY:  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    SURGICAL HISTORY: Past Surgical History:  Procedure Laterality Date   APPENDECTOMY     BREAST BIOPSY Right 09/16/2022   MM RT BREAST BX W LOC DEV 1ST LESION IMAGE BX SPEC STEREO GUIDE  09/16/2022 [facility]   BREAST BIOPSY  12/06/2022   MM RT RADIOACTIVE SEED LOC MAMMO GUIDE 12/06/2022 [facility]   BREAST EXCISIONAL BIOPSY Right 2018   CHOLECYSTECTOMY     CYSTOSCOPY W/ RETROGRADES     KYPHOPLASTY N/A 10/05/2021   Procedure: LUMBAR ONE KYPHOPLASTY;  Surgeon: Cheryle Debby LABOR, MD;  Location: [facility] OR;  Service: Neurosurgery;  Laterality: N/A;   NECK SURGERY     RADIOACTIVE SEED GUIDED EXCISIONAL BREAST BIOPSY Right 12/12/2017    Procedure: RIGHT RADIOACTIVE SEED GUIDED EXCISIONAL BREAST BIOPSY ERAS PATHWAY;  Surgeon: Ebbie Cough, MD;  Location: [facility];  Service: General;  Laterality: Right;  LMA   RADIOACTIVE SEED GUIDED EXCISIONAL BREAST BIOPSY Right 12/07/2022   Procedure: RADIOACTIVE SEED GUIDED EXCISIONAL RIGHT BREAST BIOPSY;  Surgeon: Ebbie Cough, MD;  Location: [facility];  Service: General;  Laterality: Right;   SHOULDER SURGERY     TONSILLECTOMY     TOTAL HIP ARTHROPLASTY Left 06/04/2021   Procedure: TOTAL HIP ARTHROPLASTY ANTERIOR APPROACH;  Surgeon: Yvone Rush, MD;  Location: [facility];  Service: Orthopedics;  Laterality: Left;   TRIGGER FINGER RELEASE Right 05/18/2015   Procedure: RIGHT  LONG FINGER TRIGGER RELEASE ;  Surgeon: Alm Hummer, MD;  Location: [facility];  Service: Orthopedics;  Laterality: Right;    SOCIAL HISTORY: Social History   Socioeconomic History   Marital status: Married    Spouse name: Not on file   Number of children: Not on file   Years of education: Not on file   Highest education level: Not on file  Occupational History   Not on file  Tobacco Use   Smoking status: Never   Smokeless tobacco: Never  Vaping Use   Vaping status: Never Used  Substance and Sexual Activity   Alcohol  use: No   Drug use: No   Sexual activity: Not on file  Other Topics Concern   Not on file  Social History Narrative   Not on file   Social Drivers of Health   Financial Resource Strain: Low Risk  (04/24/2023)   Received from [facility]   Overall Financial Resource Strain (CARDIA)    Difficulty of Paying Living Expenses: Not hard at all  Food Insecurity: No Food Insecurity (04/24/2023)   Received from [facility]   Hunger Vital Sign    Worried About Running Out of Food in the Last Year: Never true    Ran Out of Food in the Last Year: Never true  Transportation Needs: No Transportation Needs (04/24/2023)   Received [facility]  [facility] - Transportation    Lack of Transportation (Medical): No    Lack of Transportation (Non-Medical): No  Physical Activity: Inactive (04/24/2023)   Received from [facility]   Exercise Vital Sign    Days of Exercise per Week: 0 days    Minutes of Exercise per Session: 10 min  Stress: No Stress Concern Present (04/24/2023)   Received from [facility] - Occupational Stress Questionnaire    Feeling of Stress : Not at all  Social Connections: Socially Integrated (04/24/2023)   Received from [facility]   Social Network    How would you rate your social network (family, work, friends)?: Good participation with social networks  Intimate Partner Violence: Not At Risk (04/24/2023)   Received from [facility]   HITS    Over the last 12 months how often did your partner  physically hurt you?: Never    Over the last 12 months how often did your partner insult you or talk down to you?: Never    Over the last 12 months how often did your partner threaten you with physical harm?: Never    Over the last 12 months how often did your partner scream or curse at you?: Never    FAMILY HISTORY: Family History  Problem Relation Age of Onset   Breast cancer Maternal Aunt        51s    ALLERGIES:  is allergic to gadolinium derivatives, other, valium, paxlovid [nirmatrelvir-ritonavir], doxycycline , fentanyl , iohexol , macrodantin, red dye #40 (allura red), rocephin [ceftriaxone], and zithromax [azithromycin].  MEDICATIONS:  Current Outpatient Medications  Medication Sig Dispense Refill   albuterol  (VENTOLIN  HFA) 108 (90 Base) MCG/ACT inhaler Inhale 1-2 puffs into the lungs every 6 (six) hours as needed for wheezing or shortness of breath.     butalbital -acetaminophen -caffeine  (FIORICET) 50-325-40 MG tablet Take 1 tablet by mouth 2 (two) times daily as needed for headache or migraine.     cholecalciferol (VITAMIN D3) 25 MCG (1000 UNIT)  tablet Take 1,000 Units by mouth daily.     Evolocumab (REPATHA ) Inject into the skin.     levothyroxine  (SYNTHROID ) 25 MCG tablet Take 25 mcg by mouth daily before breakfast.     loratadine  (CLARITIN ) 10 MG tablet Take 10 mg by mouth daily.     montelukast  (SINGULAIR ) 10 MG tablet Take 10 mg by mouth at bedtime.     traZODone  (DESYREL ) 100 MG tablet Take 100 mg by mouth at bedtime.     No current facility-administered medications for this visit.   Facility-Administered Medications Ordered in Other Visits  Medication Dose Route Frequency Provider Last Rate Last Admin   Chlorhexidine  Gluconate Cloth 2 % PADS 6 each  6 each Topical Once Ebbie Cough, MD       And   Chlorhexidine  Gluconate Cloth 2 % PADS 6 each  6 each Topical Once Ebbie Cough, MD       feeding supplement (ENSURE PRE-SURGERY) liquid 296 mL  296 mL Oral Once Ebbie Cough, MD        REVIEW OF SYSTEMS:   Constitutional: Denies fevers, chills or abnormal night sweats Eyes: Denies blurriness of vision, double vision or watery eyes Ears, nose, mouth, throat, and face: Denies mucositis or sore throat Respiratory: Denies cough, dyspnea or wheezes Cardiovascular: Denies palpitation, chest discomfort or lower extremity swelling Gastrointestinal:  Denies nausea, heartburn or change in bowel habits Skin: Denies abnormal skin rashes Lymphatics: Denies new lymphadenopathy or easy bruising Neurological:Denies numbness, tingling or new weaknesses Behavioral/Psych: Mood is stable, no new changes  Breast: As mentioned above All other systems were reviewed with the patient and are negative.  PHYSICAL EXAMINATION: ECOG PERFORMANCE STATUS: 0 - Asymptomatic  Vitals:   10/09/23 1357 10/09/23 1358  BP: (!) 144/70 (!) 145/78  Pulse: 91   Resp: 17   Temp: 97.9 F (36.6 C)   SpO2: 99%    Filed Weights   10/09/23 1357  Weight: 198 lb (89.8 kg)    GENERAL:alert, no distress and comfortable SKIN: skin color,  texture, turgor are normal, no rashes or significant lesions EYES: normal, conjunctiva are pink and non-injected, sclera clear OROPHARYNX:no exudate, no erythema and lips, buccal mucosa, and tongue normal  NECK: supple, thyroid  normal size, non-tender, without nodularity LYMPH:  no palpable lymphadenopathy in the cervical,  LUNGS: clear to auscultation and percussion with normal breathing effort  HEART: regular rate & rhythm and no murmurs and no lower extremity edema ABDOMEN:abdomen soft, non-tender and normal bowel sounds Musculoskeletal:no cyanosis of digits and no clubbing  PSYCH: alert & oriented x 3 with fluent speech NEURO: no focal motor/sensory deficits BREAST: Palpable breast upper inner quadrant mass measuring approximately 2-1/2 to 3 cm.  Palpable left axillary lymphadenopathy.  Postbiopsy changes noted as well in the left breast.  LABORATORY DATA:  I have reviewed the data as listed Lab Results  Component Value Date   WBC 6.9 10/09/2023   HGB 16.2 (H) 10/09/2023   HCT 46.4 (H) 10/09/2023   MCV 92.4 10/09/2023   PLT 229 10/09/2023   Lab Results  Component Value Date   NA 139 10/09/2023   K 3.9 10/09/2023   CL 106 10/09/2023   CO2 28 10/09/2023    RADIOGRAPHIC STUDIES: I have personally reviewed the radiological reports and agreed with the findings in the report.  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  Invasive Ductal Carcinoma, Left Breast Newly diagnosed, ER/PR negative, HER2 FISH positive -Order echocardiogram to establish cardiac baseline prior to chemotherapy. -Discussed path results, will start neoadj TCHP. -Plan to monitor tumor response to therapy,  with the goal of achieving no residual tumor at the time of surgery. -Consider genetic testing for high-risk breast cancer genes (e.g., BRCA, PALB2) to inform surgical decision-making and potential future treatments.  Allergies Noted severe reactions to contrast dye and gabapentin . -Alert pharmacy to ensure no medications with dyes are prescribed. -Consider alternatives to gabapentin  if needed for neuropathy management during chemotherapy.  General Health Maintenance -Continue Repatha for cholesterol management. -Ensure patient is up-to-date on vaccinations, including flu vaccine.  Follow-up Plans -Plan to monitor patient closely during initial weeks of chemotherapy, with weekly visits to assess tolerance and manage side effects. -Plan for port placement by Dr. Ebbie for chemotherapy administration. -Plan for surgery after completion of neoadjuvant therapy, with post-operative treatment to be determined based on tumor response to chemotherapy.    All questions were answered. The patient knows to call the clinic with any problems, questions or concerns.    Amber Stalls, MD 10/10/23

## 2023-10-09 NOTE — Assessment & Plan Note (Addendum)
 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  Invasive Ductal Carcinoma, Left Breast Newly diagnosed, ER/PR negative, HER2 FISH positive -Order echocardiogram to establish cardiac baseline prior to chemotherapy. -Discussed path results, will start neoadj TCHP. -Plan to monitor tumor response to therapy, with the goal of achieving no residual tumor at the time of surgery. -Consider genetic testing for high-risk breast cancer genes (e.g., BRCA, PALB2) to inform surgical decision-making and potential future treatments.  Allergies Noted severe reactions to contrast dye and gabapentin . -Alert pharmacy to ensure no medications with dyes are prescribed. -Consider alternatives to gabapentin  if needed for neuropathy management during chemotherapy.  General Health Maintenance -Continue Repatha for cholesterol management. -Ensure patient is up-to-date on vaccinations, including flu vaccine.  Follow-up Plans -Plan to monitor patient closely during initial weeks of chemotherapy, with weekly visits to assess tolerance and manage side effects. -Plan for port placement by Dr. Ebbie for chemotherapy administration. -Plan for surgery after completion of neoadjuvant therapy, with post-operative treatment to be determined based on tumor response to chemotherapy.

## 2023-10-10 ENCOUNTER — Encounter: Payer: Self-pay | Admitting: Rehabilitation

## 2023-10-10 ENCOUNTER — Encounter: Payer: Self-pay | Admitting: Hematology and Oncology

## 2023-10-10 ENCOUNTER — Ambulatory Visit: Payer: PPO | Attending: General Surgery | Admitting: Rehabilitation

## 2023-10-10 ENCOUNTER — Other Ambulatory Visit: Payer: Self-pay

## 2023-10-10 DIAGNOSIS — R293 Abnormal posture: Secondary | ICD-10-CM | POA: Insufficient documentation

## 2023-10-10 DIAGNOSIS — Z171 Estrogen receptor negative status [ER-]: Secondary | ICD-10-CM | POA: Diagnosis present

## 2023-10-10 DIAGNOSIS — C50212 Malignant neoplasm of upper-inner quadrant of left female breast: Secondary | ICD-10-CM | POA: Insufficient documentation

## 2023-10-10 NOTE — Therapy (Signed)
 OUTPATIENT PHYSICAL THERAPY BREAST CANCER BASELINE EVALUATION   Patient Name: Rebecca Steele MRN: [id number] DOB:08-Aug-1951, 73 y.o., female Today's Date: 10/10/2023  END OF SESSION:  PT End of Session - 10/10/23 1242     Visit Number 1    Number of Visits 2    Date for PT Re-Evaluation 05/07/24    PT Start Time 1203    PT Stop Time 1239    PT Time Calculation (min) 36 min    Activity Tolerance Patient tolerated treatment well    Behavior During Therapy WFL for tasks assessed/performed             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   Past Surgical History:  Procedure Laterality Date   APPENDECTOMY     BREAST BIOPSY Right 09/16/2022   MM RT BREAST BX W LOC DEV 1ST LESION IMAGE BX SPEC STEREO GUIDE 09/16/2022 [facility]   BREAST BIOPSY  12/06/2022   MM RT RADIOACTIVE SEED LOC MAMMO GUIDE 12/06/2022 [facility]   BREAST EXCISIONAL BIOPSY Right 2018   CHOLECYSTECTOMY     CYSTOSCOPY W/ RETROGRADES     KYPHOPLASTY N/A 10/05/2021   Procedure: LUMBAR ONE KYPHOPLASTY;  Surgeon: Cheryle Debby LABOR, MD;  Location: [facility];  Service: Neurosurgery;  Laterality: N/A;   NECK SURGERY     RADIOACTIVE SEED GUIDED EXCISIONAL BREAST BIOPSY Right 12/12/2017   Procedure: RIGHT RADIOACTIVE SEED GUIDED EXCISIONAL BREAST BIOPSY ERAS PATHWAY;  Surgeon: Ebbie Cough, MD;  Location: [facility];  Service: General;  Laterality: Right;  LMA   RADIOACTIVE SEED GUIDED EXCISIONAL BREAST BIOPSY Right 12/07/2022   Procedure: RADIOACTIVE SEED GUIDED EXCISIONAL RIGHT BREAST BIOPSY;  Surgeon: Ebbie Cough, MD;  Location: [facility];  Service:  General;  Laterality: Right;   SHOULDER SURGERY     TONSILLECTOMY     TOTAL HIP ARTHROPLASTY Left 06/04/2021   Procedure: TOTAL HIP ARTHROPLASTY ANTERIOR APPROACH;  Surgeon: Yvone Rush, MD;  Location: [facility];  Service: Orthopedics;  Laterality: Left;   TRIGGER FINGER RELEASE Right 05/18/2015   Procedure: RIGHT  LONG FINGER TRIGGER RELEASE ;  Surgeon: Alm Hummer, MD;  Location: [facility];  Service: Orthopedics;  Laterality: Right;   Patient Active Problem List   Diagnosis Date Noted   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 Cough Ebbie  REFERRING DIAG: Left breast cancer   THERAPY DIAG:  Malignant neoplasm of upper-inner quadrant of left breast in female, estrogen receptor negative (HCC)  Abnormal posture  Rationale for Evaluation and Treatment: Rehabilitation  ONSET DATE: 09/22/23  SUBJECTIVE:  SUBJECTIVE STATEMENT: Patient reports she is here today to be seen by her medical team for her newly diagnosed left breast cancer.   PERTINENT HISTORY:  Patient was diagnosed on 09/22/23 with left grade 3 IDC. It measures at least 5mm and is located in the upper inner quadrant. It is ER/PR neg, HER2 unknown with a Ki67 unknown with 1 metastatic lymph node. Plan to initiate neoadjuvant chemotherapy, regimen to  be determined based on HER2 status. -If HER2 positive, plan for TCHP regimen. If triple negative, consider Keynote 522 regimen. Other hx: Cervical fusion.   PATIENT GOALS:   reduce lymphedema risk and learn post op HEP.   PAIN:  Are you having pain? No  PRECAUTIONS: Active CA   RED FLAGS: None   HAND DOMINANCE: right  WEIGHT BEARING RESTRICTIONS: No  FALLS:  Has patient fallen in last 6 months? No  LIVING ENVIRONMENT: Patient lives with: husband   OCCUPATION: part time employment.  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   LEISURE: play piano and organ.    PRIOR LEVEL OF FUNCTION: Independent   OBJECTIVE: Note: Objective measures were completed at Evaluation unless otherwise noted.  COGNITION: Overall cognitive status: Within functional limits for tasks assessed    POSTURE:  Forward head and rounded shoulders posture  UPPER EXTREMITY AROM/PROM:  A/PROM RIGHT   eval   Shoulder extension 50  Shoulder flexion 145  Shoulder abduction 165  Shoulder internal rotation 75  Shoulder external rotation 80    (Blank rows = not tested)  A/PROM LEFT   eval  Shoulder extension 60  Shoulder flexion 150  Shoulder abduction 160  Shoulder internal rotation 75  Shoulder external rotation 75    (Blank rows = not tested)  CERVICAL AROM:   Percent limited  Flexion   Extension 50%  Right lateral flexion   Left lateral flexion   Right rotation 50%  Left rotation     UPPER EXTREMITY STRENGTH:  5/5 Lt      5/5 Rt except for ER at 4/5  LYMPHEDEMA ASSESSMENTS (in cm):   LANDMARK RIGHT   eval  10 cm proximal to olecranon process 32  Olecranon process 28  10 cm proximal to ulnar styloid process 20.9  Just proximal to ulnar styloid process 17.2  Across hand at thumb web space 19.3  At base of 2nd digit 6.6  (Blank rows = not tested)  LANDMARK LEFT   eval  10 cm proximal to olecranon  process 31.8  Olecranon process 28.5  10 cm proximal to ulnar styloid process 21.4  Just proximal to ulnar styloid process 17.8  Across hand at thumb web space 19.5  At base of 2nd digit 6.6  (Blank rows = not tested)  L-DEX LYMPHEDEMA SCREENING: The patient was assessed using the L-Dex machine today to produce a lymphedema index baseline score. The patient will be reassessed on a regular basis (typically every 3 months) to obtain new L-Dex scores. If the score is > 6.5 points away from his/her baseline score indicating onset of subclinical lymphedema, it will be recommended to wear a compression garment for 4 weeks, 12 hours per day and then be reassessed. If the score continues to be > 6.5 points from baseline at reassessment, we will initiate lymphedema treatment. Assessing in this manner has a 95% rate of preventing clinically significant lymphedema.   L-DEX FLOWSHEETS - 10/10/23 1200  L-DEX LYMPHEDEMA SCREENING   Measurement Type Unilateral    L-DEX MEASUREMENT EXTREMITY Upper Extremity    POSITION  Standing    DOMINANT SIDE Right    At Risk Side Left    BASELINE SCORE (UNILATERAL) 3.8             QUICK DASH SURVEY: 2.77% limited   PATIENT EDUCATION:  Education details: Lymphedema risk reduction and post op shoulder/posture HEP, importance of walking with chemo Person educated: Patient Education method: Explanation, Demonstration, Handout Education comprehension: Patient verbalized understanding and returned demonstration  HOME EXERCISE PROGRAM: Patient was instructed today in a home exercise program today for post op shoulder range of motion. These included active assist shoulder flexion in sitting, scapular retraction, wall walking with shoulder abduction, and hands behind head external rotation.  She was encouraged to do these twice a day, holding 3 seconds and repeating 5 times when permitted by her physician.   ASSESSMENT:  CLINICAL IMPRESSION: Pt will  benefit from a post op PT reassessment to determine needs and from L-Dex screens every 3 months for 2 years to detect subclinical lymphedema. Her surgery will most likely not be until May or June of this year.   Pt will benefit from skilled therapeutic intervention to improve on the following deficits: Decreased knowledge of precautions, impaired UE functional use, pain, decreased ROM, postural dysfunction.   PT treatment/interventions: ADL/self-care home management, pt/family education, therapeutic exercise  REHAB POTENTIAL: Excellent  CLINICAL DECISION MAKING: Stable/uncomplicated  EVALUATION COMPLEXITY: Low   GOALS: Goals reviewed with patient? YES  LONG TERM GOALS: (STG=LTG)    Name Target Date Goal status  1 Pt will be able to verbalize understanding of pertinent lymphedema risk reduction practices relevant to her dx specifically related to skin care.  Baseline:  No knowledge 10/10/2023 Achieved at eval  2 Pt will be able to return demo and/or verbalize understanding of the post op HEP related to regaining shoulder ROM. Baseline:  No knowledge 10/10/2023 Achieved at eval  3 Pt will be able to verbalize understanding of the importance of attending the post op After Breast CA Class for further lymphedema risk reduction education and therapeutic exercise.  Baseline:  No knowledge 10/10/2023 Achieved at eval  4 Pt will demo she has regained full shoulder ROM and function post operatively compared to baselines.  Baseline: See objective measurements taken today. 05/07/24     PLAN:  PT FREQUENCY/DURATION: EVAL and 1 follow up appointment.   PLAN FOR NEXT SESSION: will reassess 3-4 weeks post op to determine needs.   Patient will follow up at outpatient cancer rehab 3-4 weeks following surgery.  If the patient requires physical therapy at that time, a specific plan will be dictated and sent to the referring physician for approval. The patient was educated today on appropriate basic range of  motion exercises to begin post operatively and the importance of attending the After Breast Cancer class following surgery.  Patient was educated today on lymphedema risk reduction practices as it pertains to recommendations that will benefit the patient immediately following surgery.  She verbalized good understanding.    Physical Therapy Information for After Breast Cancer Surgery/Treatment:  Lymphedema is a swelling condition that you may be at risk for in your arm if you have lymph nodes removed from the armpit area.  After a sentinel node biopsy, the risk is approximately 5-9% and is higher after an axillary node dissection.  There is treatment available for this condition and it is not life-threatening.  Contact your physician or physical therapist with concerns. You may begin the 4 shoulder/posture exercises (see additional sheet) when permitted by your physician (typically a week after surgery).  If you have drains, you may need to wait until those are removed before beginning range of motion exercises.  A general recommendation is to not lift your arms above shoulder height until drains are removed.  These exercises should be done to your tolerance and gently.  This is not a no pain/no gain type of recovery so listen to your body and stretch into the range of motion that you can tolerate, stopping if you have pain.  If you are having immediate reconstruction, ask your plastic surgeon about doing exercises as he or she may want you to wait. We encourage you to attend the free one time ABC (After Breast Cancer) class offered by [facility].  You will learn information related to lymphedema risk, prevention and treatment and additional exercises to regain mobility following surgery.  You can call [phone] for more information.  This is offered the 1st and 3rd Monday of each month.  You only attend the class one time. While undergoing any medical procedure or treatment, try  to avoid blood pressure being taken or needle sticks from occurring on the arm on the side of cancer.   This recommendation begins after surgery and continues for the rest of your life.  This may help reduce your risk of getting lymphedema (swelling in your arm). An excellent resource for those seeking information on lymphedema is the National Lymphedema Network's web site. It can be accessed at www.lymphnet.org If you notice swelling in your hand, arm or breast at any time following surgery (even if it is many years from now), please contact your doctor or physical therapist to discuss this.  Lymphedema can be treated at any time but it is easier for you if it is treated early on.  If you feel like your shoulder motion is not returning to normal in a reasonable amount of time, please contact your surgeon or physical therapist.  [facility] [phone]. [address], [address], [place] [address]  ABC CLASS After Breast Cancer Class  After Breast Cancer Class is a specially designed exercise class to assist you in a safe recover after having breast cancer surgery.  In this class you will learn how to get back to full function whether your drains were just removed or if you had surgery a month ago.  This one-time class is held the 1st and 3rd Monday of every month from 11:00 a.m. until 12:00 noon virtually.  This class is FREE and space is limited. For more information or to register for the next available class, call [phone].  Class Goals  Understand specific stretches to improve the flexibility of you chest and shoulder. Learn ways to safely strengthen your upper body and improve your posture. Understand the warning signs of infection and why you may be at risk for an arm infection. Learn about Lymphedema and prevention.  ** You do not attend this class until after surgery.  Drains must be removed to participate  Patient was instructed today in a  home exercise program today for post op shoulder range of motion. These included active assist shoulder flexion in sitting, scapular retraction, wall walking with shoulder abduction, and hands behind head external rotation.  She was encouraged to do these twice a day, holding 3 seconds and repeating 5  times when permitted by her physician.    Larue Saddie SAUNDERS, PT 10/10/2023, 12:45 PM

## 2023-10-10 NOTE — Progress Notes (Signed)
 START ON PATHWAY REGIMEN - Breast     Cycle 1: A cycle is 21 days:     Pertuzumab       Trastuzumab -xxxx      Docetaxel       Carboplatin     Cycles 2 through 6: A cycle is every 21 days:     Pertuzumab       Trastuzumab -xxxx      Docetaxel       Carboplatin    **Always confirm dose/schedule in your pharmacy ordering system**  Patient Characteristics: Preoperative or Nonsurgical Candidate, M0 (Clinical Staging), Up to cT4c, Any N, M0, Neoadjuvant Therapy followed by Surgery, Invasive Disease, Chemotherapy, HER2 Positive, ER Negative Therapeutic Status: Preoperative or Nonsurgical Candidate, M0 (Clinical Staging) AJCC M Category: cM0 AJCC Grade: G2 ER Status: Negative (-) AJCC 8 Stage Grouping: IIB HER2 Status: Positive (+) AJCC T Category: cT2 AJCC N Category: cN1 PR Status: Negative (-) Breast Surgical Plan: Neoadjuvant Therapy followed by Surgery Intent of Therapy: Curative Intent, Discussed with Patient

## 2023-10-11 ENCOUNTER — Encounter: Payer: Self-pay | Admitting: Internal Medicine

## 2023-10-11 ENCOUNTER — Other Ambulatory Visit: Payer: Self-pay

## 2023-10-11 NOTE — Anesthesia Preprocedure Evaluation (Addendum)
 Anesthesia Evaluation  Patient identified by MRN, date of birth, ID band Patient awake    Reviewed: Allergy & Precautions, NPO status , Patient's Chart, lab work & pertinent test results  History of Anesthesia Complications (+) history of anesthetic complications  Airway Mallampati: II  TM Distance: >3 FB Neck ROM: Full    Dental no notable dental hx. (+) Teeth Intact, Dental Advisory Given   Pulmonary sleep apnea    Pulmonary exam normal breath sounds clear to auscultation       Cardiovascular hypertension, Pt. on medications Normal cardiovascular exam Rhythm:Regular Rate:Normal   Echo 12/13/17: EF 55-60%, g1dd, mild TR, PASP 33   Neuro/Psych  PSYCHIATRIC DISORDERS  Depression    negative neurological ROS     GI/Hepatic negative GI ROS, Neg liver ROS,,,  Endo/Other  Hypothyroidism    Renal/GU Renal diseasenegative Renal ROS  negative genitourinary   Musculoskeletal  (+) Arthritis , Osteoarthritis,    Abdominal   Peds  Hematology negative hematology ROS (+)   Anesthesia Other Findings   Reproductive/Obstetrics                             Anesthesia Physical Anesthesia Plan  ASA: 3  Anesthesia Plan: General   Post-op Pain Management: Ofirmev  IV (intra-op)* and Precedex    Induction: Intravenous  PONV Risk Score and Plan: 2 and Treatment may vary due to age or medical condition, Ondansetron , Propofol  infusion and TIVA  Airway Management Planned: LMA  Additional Equipment: None  Intra-op Plan:   Post-operative Plan: Extubation in OR  Informed Consent: I have reviewed the patients History and Physical, chart, labs and discussed the procedure including the risks, benefits and alternatives for the proposed anesthesia with the patient or authorized representative who has indicated his/her understanding and acceptance.     Dental advisory given  Plan Discussed with: CRNA,  Surgeon and Anesthesiologist  Anesthesia Plan Comments:         Anesthesia Quick Evaluation

## 2023-10-12 ENCOUNTER — Telehealth: Payer: Self-pay | Admitting: Hematology and Oncology

## 2023-10-12 ENCOUNTER — Other Ambulatory Visit: Payer: Self-pay

## 2023-10-12 ENCOUNTER — Encounter (HOSPITAL_BASED_OUTPATIENT_CLINIC_OR_DEPARTMENT_OTHER)
Admission: RE | Disposition: A | Discharge status: Home / Self Care | Payer: Self-pay | Source: Home / Self Care | Attending: General Surgery

## 2023-10-12 ENCOUNTER — Ambulatory Visit (HOSPITAL_BASED_OUTPATIENT_CLINIC_OR_DEPARTMENT_OTHER): Payer: PPO | Admitting: Anesthesiology

## 2023-10-12 ENCOUNTER — Ambulatory Visit (HOSPITAL_COMMUNITY): Payer: PPO

## 2023-10-12 ENCOUNTER — Ambulatory Visit (HOSPITAL_BASED_OUTPATIENT_CLINIC_OR_DEPARTMENT_OTHER)
Admission: RE | Admit: 2023-10-12 | Discharge: 2023-10-12 | Disposition: A | Discharge status: Home / Self Care | Payer: PPO | Attending: General Surgery | Admitting: General Surgery

## 2023-10-12 ENCOUNTER — Encounter (HOSPITAL_BASED_OUTPATIENT_CLINIC_OR_DEPARTMENT_OTHER): Payer: Self-pay | Admitting: General Surgery

## 2023-10-12 DIAGNOSIS — C50911 Malignant neoplasm of unspecified site of right female breast: Secondary | ICD-10-CM | POA: Insufficient documentation

## 2023-10-12 DIAGNOSIS — Z171 Estrogen receptor negative status [ER-]: Secondary | ICD-10-CM | POA: Diagnosis not present

## 2023-10-12 DIAGNOSIS — C50211 Malignant neoplasm of upper-inner quadrant of right female breast: Secondary | ICD-10-CM

## 2023-10-12 HISTORY — PX: PORTACATH PLACEMENT: SHX2246

## 2023-10-12 SURGERY — INSERTION, TUNNELED CENTRAL VENOUS DEVICE, WITH PORT
Anesthesia: General | Site: Chest | Laterality: Right

## 2023-10-12 MED ORDER — DEXMEDETOMIDINE HCL IN NACL 200 MCG/50ML IV SOLN
INTRAVENOUS | Status: DC | PRN
  Administered 2023-10-12: 8 ug via INTRAVENOUS

## 2023-10-12 MED ORDER — ACETAMINOPHEN 10 MG/ML IV SOLN
INTRAVENOUS | Status: DC | PRN
  Administered 2023-10-12: 1000 mg via INTRAVENOUS

## 2023-10-12 MED ORDER — ONDANSETRON HCL 4 MG/2ML IJ SOLN
INTRAMUSCULAR | Status: AC
  Filled 2023-10-12: qty 2

## 2023-10-12 MED ORDER — HEPARIN (PORCINE) IN NACL 2-0.9 UNITS/ML
INTRAMUSCULAR | Status: AC | PRN
  Administered 2023-10-12: 1 via INTRAVENOUS

## 2023-10-12 MED ORDER — BUPIVACAINE HCL (PF) 0.25 % IJ SOLN
INTRAMUSCULAR | Status: AC
Start: 2023-10-12 — End: ?
  Filled 2023-10-12: qty 30

## 2023-10-12 MED ORDER — LIDOCAINE 2% (20 MG/ML) 5 ML SYRINGE
INTRAMUSCULAR | Status: DC | PRN
  Administered 2023-10-12: 100 mg via INTRAVENOUS

## 2023-10-12 MED ORDER — ACETAMINOPHEN 10 MG/ML IV SOLN
INTRAVENOUS | Status: AC
  Filled 2023-10-12: qty 100

## 2023-10-12 MED ORDER — MIDAZOLAM HCL 2 MG/2ML IJ SOLN
INTRAMUSCULAR | Status: AC
  Filled 2023-10-12: qty 2

## 2023-10-12 MED ORDER — HEPARIN SOD (PORK) LOCK FLUSH 100 UNIT/ML IV SOLN
INTRAVENOUS | Status: DC | PRN
  Administered 2023-10-12: 400 [IU] via INTRAVENOUS

## 2023-10-12 MED ORDER — ONDANSETRON HCL 4 MG/2ML IJ SOLN
INTRAMUSCULAR | Status: DC | PRN
  Administered 2023-10-12: 4 mg via INTRAVENOUS

## 2023-10-12 MED ORDER — PROPOFOL 10 MG/ML IV BOLUS
INTRAVENOUS | Status: AC
  Filled 2023-10-12: qty 20

## 2023-10-12 MED ORDER — LIDOCAINE 2% (20 MG/ML) 5 ML SYRINGE
INTRAMUSCULAR | Status: AC
  Filled 2023-10-12: qty 5

## 2023-10-12 MED ORDER — ONDANSETRON HCL 4 MG/2ML IJ SOLN: 4.0000 mg | Freq: Once | INTRAMUSCULAR | Status: DC | PRN

## 2023-10-12 MED ORDER — HEPARIN SOD (PORK) LOCK FLUSH 100 UNIT/ML IV SOLN
INTRAVENOUS | Status: AC
  Filled 2023-10-12: qty 5

## 2023-10-12 MED ORDER — SODIUM CHLORIDE 0.9 % IV SOLN: INTRAVENOUS | Status: DC | PRN

## 2023-10-12 MED ORDER — PROPOFOL 10 MG/ML IV BOLUS
INTRAVENOUS | Status: DC | PRN
  Administered 2023-10-12: 150 ug/kg/min via INTRAVENOUS
  Administered 2023-10-12: 200 mg via INTRAVENOUS

## 2023-10-12 MED ORDER — ACETAMINOPHEN 10 MG/ML IV SOLN
1000.0000 mg | Freq: Once | INTRAVENOUS | Status: DC | PRN
Start: 2023-10-12 — End: 2023-10-12

## 2023-10-12 MED ORDER — CEFAZOLIN SODIUM-DEXTROSE 2-4 GM/100ML-% IV SOLN
2.0000 g | INTRAVENOUS | Status: AC
  Administered 2023-10-12: 2 g via INTRAVENOUS

## 2023-10-12 MED ORDER — LACTATED RINGERS IV SOLN: INTRAVENOUS | Status: DC

## 2023-10-12 MED ORDER — CEFAZOLIN SODIUM-DEXTROSE 2-4 GM/100ML-% IV SOLN
INTRAVENOUS | Status: AC
  Filled 2023-10-12: qty 100

## 2023-10-12 MED ORDER — HEPARIN (PORCINE) IN NACL 1000-0.9 UT/500ML-% IV SOLN
INTRAVENOUS | Status: AC
  Filled 2023-10-12: qty 500

## 2023-10-12 MED ORDER — FENTANYL CITRATE (PF) 100 MCG/2ML IJ SOLN: 25.0000 ug | INTRAMUSCULAR | Status: DC | PRN

## 2023-10-12 MED ORDER — BUPIVACAINE HCL (PF) 0.25 % IJ SOLN
INTRAMUSCULAR | Status: DC | PRN
  Administered 2023-10-12: 17 mL

## 2023-10-12 MED ORDER — DEXAMETHASONE SODIUM PHOSPHATE 10 MG/ML IJ SOLN
INTRAMUSCULAR | Status: AC
  Filled 2023-10-12: qty 1

## 2023-10-12 MED ORDER — DEXAMETHASONE SODIUM PHOSPHATE 10 MG/ML IJ SOLN
INTRAMUSCULAR | Status: DC | PRN
  Administered 2023-10-12: 10 mg via INTRAVENOUS

## 2023-10-12 MED ORDER — PROPOFOL 1000 MG/100ML IV EMUL
INTRAVENOUS | Status: AC
  Filled 2023-10-12: qty 200

## 2023-10-12 SURGICAL SUPPLY — 41 items
BAG DECANTER FOR FLEXI CONT (MISCELLANEOUS) ×1 IMPLANT
BENZOIN TINCTURE PRP APPL 2/3 (GAUZE/BANDAGES/DRESSINGS) ×1 IMPLANT
BLADE SURG 11 STRL SS (BLADE) ×1 IMPLANT
BLADE SURG 15 STRL LF DISP TIS (BLADE) ×1 IMPLANT
CANISTER SUCT 1200ML W/VALVE (MISCELLANEOUS) IMPLANT
CHLORAPREP W/TINT 26 (MISCELLANEOUS) ×1 IMPLANT
COVER BACK TABLE 60X90IN (DRAPES) ×1 IMPLANT
COVER MAYO STAND STRL (DRAPES) ×1 IMPLANT
DERMABOND ADVANCED .7 DNX12 (GAUZE/BANDAGES/DRESSINGS) ×1 IMPLANT
DRAPE C-ARM 42X72 X-RAY (DRAPES) ×1 IMPLANT
DRAPE LAPAROSCOPIC ABDOMINAL (DRAPES) ×1 IMPLANT
DRAPE UTILITY XL STRL (DRAPES) ×1 IMPLANT
DRSG TEGADERM 4X4.75 (GAUZE/BANDAGES/DRESSINGS) IMPLANT
ELECT COATED BLADE 2.86 ST (ELECTRODE) ×1 IMPLANT
ELECT REM PT RETURN 9FT ADLT (ELECTROSURGICAL) ×1 IMPLANT
ELECTRODE REM PT RTRN 9FT ADLT (ELECTROSURGICAL) ×1 IMPLANT
GAUZE 4X4 16PLY ~~LOC~~+RFID DBL (SPONGE) ×1 IMPLANT
GAUZE SPONGE 4X4 12PLY STRL LF (GAUZE/BANDAGES/DRESSINGS) ×1 IMPLANT
GLOVE BIO SURGEON STRL SZ7 (GLOVE) ×1 IMPLANT
GLOVE BIOGEL PI IND STRL 7.5 (GLOVE) ×1 IMPLANT
GOWN STRL REUS W/ TWL LRG LVL3 (GOWN DISPOSABLE) ×2 IMPLANT
IV KIT MINILOC 20X1 SAFETY (NEEDLE) IMPLANT
KIT CVR 48X5XPRB PLUP LF (MISCELLANEOUS) IMPLANT
KIT PORT POWER 8FR ISP CVUE (Port) IMPLANT
NDL HYPO 25X1 1.5 SAFETY (NEEDLE) ×1 IMPLANT
NDL SAFETY ECLIPSE 18X1.5 (NEEDLE) IMPLANT
NEEDLE HYPO 25X1 1.5 SAFETY (NEEDLE) ×1 IMPLANT
PACK BASIN DAY SURGERY FS (CUSTOM PROCEDURE TRAY) ×1 IMPLANT
PENCIL SMOKE EVACUATOR (MISCELLANEOUS) ×1 IMPLANT
SLEEVE SCD COMPRESS KNEE MED (STOCKING) ×1 IMPLANT
SPIKE FLUID TRANSFER (MISCELLANEOUS) IMPLANT
STRIP CLOSURE SKIN 1/2X4 (GAUZE/BANDAGES/DRESSINGS) ×1 IMPLANT
SUT MNCRL AB 4-0 PS2 18 (SUTURE) ×1 IMPLANT
SUT PROLENE 2 0 SH DA (SUTURE) ×1 IMPLANT
SUT SILK 2 0 TIES 17X18 (SUTURE) IMPLANT
SUT VIC AB 3-0 SH 27X BRD (SUTURE) ×1 IMPLANT
SYR 5ML LUER SLIP (SYRINGE) ×1 IMPLANT
SYR CONTROL 10ML LL (SYRINGE) ×1 IMPLANT
TOWEL GREEN STERILE FF (TOWEL DISPOSABLE) ×1 IMPLANT
TUBE CONNECTING 20X1/4 (TUBING) IMPLANT
YANKAUER SUCT BULB TIP NO VENT (SUCTIONS) IMPLANT

## 2023-10-12 NOTE — Anesthesia Procedure Notes (Signed)
 Procedure Name: LMA Insertion Date/Time: 10/12/2023 8:09 AM  Performed by: Maudine Grayce ORN, CRNAPre-anesthesia Checklist: Patient identified, Emergency Drugs available, Suction available and Patient being monitored Patient Re-evaluated:Patient Re-evaluated prior to induction Oxygen Delivery Method: Circle System Utilized Preoxygenation: Pre-oxygenation with 100% oxygen Induction Type: IV induction Ventilation: Mask ventilation without difficulty LMA: LMA inserted LMA Size: 4.0 Number of attempts: 1 Airway Equipment and Method: Bite block Placement Confirmation: positive ETCO2 and breath sounds checked- equal and bilateral Tube secured with: Tape Dental Injury: Teeth and Oropharynx as per pre-operative assessment

## 2023-10-12 NOTE — Interval H&P Note (Signed)
 History and Physical Interval Note:  10/12/2023 7:12 AM  Rebecca Steele  has presented today for surgery, with the diagnosis of BREAST CANCER.  The various methods of treatment have been discussed with the patient and family. After consideration of risks, benefits and other options for treatment, the patient has consented to  Procedure(s): INSERTION PORT-A-CATH WITH GUIDED ULTRASOUND (N/A) as a surgical intervention.  The patient's history has been reviewed, patient examined, no change in status, stable for surgery.  I have reviewed the patient's chart and labs.  Questions were answered to the patient's satisfaction.     Donnice Bury

## 2023-10-12 NOTE — Op Note (Signed)
 Preoperative diagnosis: Clinical stage II breast cancer Postoperative diagnosis: Same as above Procedure: Right internal jugular vein port placement with ultrasound guidance Surgeon: Dr. Adina Bury Anesthesia: General Specimens: None Complications: None Estimated blood loss: Minimal Sponge count was correct completion Disposition recovery stable condition  Indications: This is a fit 73 year old female who is due to begin systemic chemotherapy for breast cancer.  We discussed port placement.  Procedure: After informed consent was obtained she was taken to the operating room.  She was given antibiotics.  SCDs were in place.  She was placed under general anesthesia without complication.  She was prepped and draped in a standard sterile surgical fashion.  A surgical timeout was then performed.  I located the internal jugular vein with ultrasound.  I then made a small nick in the skin overlying it.  I accessed the vein under ultrasound guidance with a needle.  This was clearly in the jugular vein and aspirated blood.  I placed the wire.  This was confirmed to be in position by fluoroscopy as well as by ultrasound to be in the vein.  I then infiltrated Marcaine  on her chest.  I made an incision and created a pocket for the port.  I tunneled the line between the 2 sites.  I then inserted the dilator over the wire.  Under fluoroscopic guidance I then placed the dilator and remove the wire assembly.  I then placed the line through the  sheath.  The sheath was then removed.  The line was pulled back to be in the distal vena cava near the cavoatrial junction.  The line is in place and is ready for use.  I then attached the port and placed it in the pocket.  I sutured this into position with a Prolene suture.  I then aspirated blood and flushed it easily.  I placed heparin  in the port.  I closed this with 3-0 Vicryl for Monocryl.  Glue and a Steri-Strip were applied.  She tolerated this well was extubated and  transferred recovery stable.

## 2023-10-12 NOTE — Brief Op Note (Signed)
 10/12/2023  8:39 AM  PATIENT:  Rebecca Rebecca  73 y.o. female  PRE-OPERATIVE DIAGNOSIS:  BREAST CANCER  POST-OPERATIVE DIAGNOSIS:  BREAST CANCER  PROCEDURE:  Procedure(s): INSERTION PORT-A-CATH WITH GUIDED ULTRASOUND (Right)  SURGEON:  Surgeons and Role:    DEWAINE Ebbie Cough, MD - Primary  PHYSICIAN ASSISTANT:   ASSISTANTS: none   ANESTHESIA:   general  EBL:  none   BLOOD ADMINISTERED:none  DRAINS: none   LOCAL MEDICATIONS USED:  MARCAINE      SPECIMEN:  No Specimen  DISPOSITION OF SPECIMEN:  N/A  COUNTS:  YES  TOURNIQUET:  * No tourniquets in log *  DICTATION: .Dragon Dictation  PLAN OF CARE: Discharge to home after PACU  PATIENT DISPOSITION:  PACU - hemodynamically stable.   Delay start of Pharmacological VTE agent (>24hrs) due to surgical blood loss or risk of bleeding: not applicable

## 2023-10-12 NOTE — Transfer of Care (Signed)
 Immediate Anesthesia Transfer of Care Note  Patient: Rebecca Steele  Procedure(s) Performed: INSERTION PORT-A-CATH WITH GUIDED ULTRASOUND (Right: Chest)  Patient Location: PACU  Anesthesia Type:General  Level of Consciousness: drowsy  Airway & Oxygen Therapy: Patient Spontanous Breathing and Patient connected to face mask oxygen  Post-op Assessment: Report given to RN and Post -op Vital signs reviewed and stable  Post vital signs: Reviewed and stable  Last Vitals:  Vitals Value Taken Time  BP 107/77 10/12/23 0850  Temp    Pulse 82 10/12/23 0852  Resp 19 10/12/23 0852  SpO2 99 % 10/12/23 0852  Vitals shown include unfiled device data.  Last Pain:  Vitals:   10/12/23 0638  TempSrc: Oral  PainSc: 0-No pain      Patients Stated Pain Goal: 5 (10/12/23 9361)  Complications: No notable events documented.

## 2023-10-12 NOTE — Telephone Encounter (Signed)
 Left  patient a vm confirming upcoming appointment

## 2023-10-12 NOTE — Discharge Instructions (Addendum)
 PORT-A-CATH: POST OP INSTRUCTIONS  Always review your discharge instruction sheet given to you by the facility where your surgery was performed.   A prescription for pain medication may be given to you upon discharge. Take your pain medication as prescribed, if needed. If narcotic pain medicine is not needed, then you make take acetaminophen  (Tylenol ) or ibuprofen (Advil) as needed.  Take your usually prescribed medications unless otherwise directed. If you need a refill on your pain medication, please contact our office. All narcotic pain medicine now requires a paper prescription.  Phoned in and fax refills are no longer allowed by law.  Prescriptions will not be filled after 5 pm or on weekends.  You should follow a light diet for the remainder of the day after your procedure. Most patients will experience some mild swelling and/or bruising in the area of the incision. It may take several days to resolve. It is common to experience some constipation if taking pain medication after surgery. Increasing fluid intake and taking a stool softener (such as Colace) will usually help or prevent this problem from occurring. A mild laxative (Milk of Magnesia or Miralax ) should be taken according to package directions if there are no bowel movements after 48 hours.  Unless discharge instructions indicate otherwise, you may remove your bandages 48 hours after surgery, and you may shower at that time. You may have steri-strips (small white skin tapes) in place directly over the incision.  These strips should be left on the skin for 7-10 days.  If your surgeon used Dermabond (skin glue) on the incision, you may shower in 24 hours.  The glue will flake off over the next 2-3 weeks.  If your port is left accessed at the end of surgery (needle left in port), the dressing cannot get wet and should only by changed by a healthcare professional. When the port is no longer accessed (when the needle has been removed),  follow step 7.   ACTIVITIES:  Limit activity involving your arms for the next 72 hours. Do no strenuous exercise or activity for 1 week. You may drive when you are no longer taking prescription pain medication, you can comfortably wear a seatbelt, and you can maneuver your car. 10.You may need to see your doctor in the office for a follow-up appointment.  Please       check with your doctor.  11.When you receive a new Port-a-Cath, you will get a product guide and        ID card.  Please keep them in case you need them.  WHEN TO CALL YOUR DOCTOR [phone]): Fever over 101.0 Chills Continued bleeding from incision Increased redness and tenderness at the site Shortness of breath, difficulty breathing   The clinic staff is available to answer your questions during regular business hours. Please don't hesitate to call and ask to speak to one of the nurses or medical assistants for clinical concerns. If you have a medical emergency, go to the nearest emergency room or call 911.  A surgeon from [facility] is always on call at the hospital.     For further information, please visit [email or web address]    NO Tylenol  before 2:30pm today   Post Anesthesia Home Care Instructions  Activity: Get plenty of rest for the remainder of the day. A responsible individual must stay with you for 24 hours following the procedure.  For the next 24 hours, DO NOT: -Drive a car -Advertising copywriter -Drink alcoholic beverages -Take  any medication unless instructed by your physician -Make any legal decisions or sign important papers.  Meals: Start with liquid foods such as gelatin or soup. Progress to regular foods as tolerated. Avoid greasy, spicy, heavy foods. If nausea and/or vomiting occur, drink only clear liquids until the nausea and/or vomiting subsides. Call your physician if vomiting continues.  Special Instructions/Symptoms: Your throat may feel dry or sore from the  anesthesia or the breathing tube placed in your throat during surgery. If this causes discomfort, gargle with warm salt water . The discomfort should disappear within 24 hours.  If you had a scopolamine  patch placed behind your ear for the management of post- operative nausea and/or vomiting:  1. The medication in the patch is effective for 72 hours, after which it should be removed.  Wrap patch in a tissue and discard in the trash. Wash hands thoroughly with soap and water . 2. You may remove the patch earlier than 72 hours if you experience unpleasant side effects which may include dry mouth, dizziness or visual disturbances. 3. Avoid touching the patch. Wash your hands with soap and water  after contact with the patch.

## 2023-10-12 NOTE — Anesthesia Postprocedure Evaluation (Signed)
 Anesthesia Post Note  Patient: Rebecca Steele  Procedure(s) Performed: INSERTION PORT-A-CATH WITH GUIDED ULTRASOUND (Right: Chest)     Patient location during evaluation: PACU Anesthesia Type: General Level of consciousness: awake and alert Pain management: pain level controlled Vital Signs Assessment: post-procedure vital signs reviewed and stable Respiratory status: spontaneous breathing, nonlabored ventilation, respiratory function stable and patient connected to nasal cannula oxygen Cardiovascular status: blood pressure returned to baseline and stable Postop Assessment: no apparent nausea or vomiting Anesthetic complications: no   No notable events documented.  Last Vitals:  Vitals:   10/12/23 0944 10/12/23 1013  BP: (!) 142/80 136/78  Pulse: 81 83  Resp: 14 16  Temp:  (!) 36.2 C  SpO2: 94% 97%    Last Pain:  Vitals:   10/12/23 1013  TempSrc:   PainSc: 0-No pain                 Rebecca Steele

## 2023-10-12 NOTE — H&P (Signed)
 12 yof who has prior history of right sided excisional biopsies by me who presents with new left breast mass. On mm/us  she has c density tissue with a 2.7 cm left breast mass. There are 2 abnormal nodes. 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 The node is positive. She is here with her famly today to discuss options.   Review of Systems: A complete review of systems was obtained from the patient. I have reviewed this information and discussed as appropriate with the patient. See HPI as well for other ROS.  Review of Systems  All other systems reviewed and are negative.  Medical History: Past Medical History:  Diagnosis Date  Anxiety  Hypertension  Thyroid  disease    Past Surgical History:  Procedure Laterality Date  APPENDECTOMY  ARTHROPLASTY HIP TOTAL  BREAST EXCISIONAL BIOPSY Right  CHOLECYSTECTOMY  KYPHOPLASTY  Radioactive seed guided excisional breast biopsy  TONSILLECTOMY    Allergies  Allergen Reactions  Diazepam Anaphylaxis, Palpitations, Other (See Comments) and Shortness Of Breath  Gadolinium-Containing Contrast Media Anaphylaxis, Other (See Comments), Shortness Of Breath and Unknown  Pt was given 17ml multihance  for MRI. After about 36min30 seconds she squeezed the ball saying she felt like her throat was closing up. Exam was DC'd and radiologist gave 75mg  benadryl  IV.  Iodine  Anaphylaxis  Iohexol  Itching  Desc: Pt. stated she had iv contrast around 25 yrs ago and had itching on her neck. she took benadryl  and it went away. The rad recommended premeds and be done tomorrow but the pt.did not want to do that so we did her w/o iv contrast today. Pt states she was given Gabapentin  and Fentanyl  for anesthesia during recent surgery and was unable to wake up for several hours. Pt states she does not want to have sedation with those medications in the future.  Doxycycline  Swelling  Facial swelling  Doxycycline  Calcium  Swelling  Gabapentin  Hives  and Other (See Comments)  Nirmatrelvir-Ritonavir Hives  Nitrofurantoin Nausea And Vomiting and Vomiting  Red Dye Swelling  Facial swelling (cannot take pink or red tablets or capsules)   Current Outpatient Medications on File Prior to Visit  Medication Sig Dispense Refill  levothyroxine  (SYNTHROID ) 75 MCG tablet TAKE 1 TABLET BY MOUTH EVERY DAY IN THE MORNING ON EMPTY STOMACH  loratadine  (CLARITIN ) 10 mg tablet Take by mouth  montelukast  (SINGULAIR ) 10 mg tablet Take 1 tablet by mouth once daily  albuterol  90 mcg/actuation inhaler INHALE 1-2 PUFFS EVERY 6 HOURS AS NEEDED  aspirin  325 MG EC tablet Take by mouth  oxyBUTYnin  (DITROPAN -XL) 5 MG XL tablet Take by mouth   Family History  Problem Relation Age of Onset  Skin cancer Mother  High blood pressure (Hypertension) Mother  Hyperlipidemia (Elevated cholesterol) Mother  Heart valve disease Father  High blood pressure (Hypertension) Father  Hyperlipidemia (Elevated cholesterol) Father  Diabetes Father    Social History   Tobacco Use  Smoking Status Never  Smokeless Tobacco Never  Marital status: Married  Tobacco Use  Smoking status: Never  Smokeless tobacco: Never  Substance and Sexual Activity  Alcohol  use: Never  Drug use: Never    Objective:   Vitals:  10/05/23 1250  Pulse: 86  Weight: 88.9 kg (196 lb)  Height: 162.6 cm (5' 4)   Body mass index is 33.64 kg/m.  Physical Exam Vitals reviewed.  Constitutional:  Appearance: Normal appearance.  Chest:  Breasts: Right: No inverted nipple, mass or nipple discharge.  Left: Mass  present. No inverted nipple or nipple discharge.  Comments: 2 cm mobile mass  Lymphadenopathy:  Upper Body:  Right upper body: No supraclavicular or axillary adenopathy.  Left upper body: No supraclavicular or axillary adenopathy.  Neurological:  Mental Status: She is alert.    Assessment and Plan:   Left breast clinical stage II IDC  Port placement, SOZO, oncology appt  followed by likely lumpectomy/TAD  We discussed the staging and pathophysiology of breast cancer. We discussed all of the different options for treatment for breast cancer including surgery, chemotherapy, radiation therapy, Herceptin .   We discussed her positive nodes. After chemotherapy likely will proceed with TAD>  We discussed the options for treatment of the breast cancer which included lumpectomy versus a mastectomy. We will make final decision once chemotherapy complete.   We discussed port placement and will do that soon.  Discussed rationale for primary systemic therapy regardless of her 2, staging scans etc.,  Discussed genetics and she would like to wait for now  Cannot get mr or cem due to contrast allergies so will repeat mm/us  at end of systemic therapy and I will see right after that to schedule surgery around a month after

## 2023-10-13 ENCOUNTER — Other Ambulatory Visit: Payer: Self-pay

## 2023-10-13 ENCOUNTER — Encounter (HOSPITAL_BASED_OUTPATIENT_CLINIC_OR_DEPARTMENT_OTHER): Payer: Self-pay | Admitting: General Surgery

## 2023-10-16 ENCOUNTER — Ambulatory Visit (HOSPITAL_COMMUNITY)
Admission: RE | Admit: 2023-10-16 | Discharge: 2023-10-16 | Disposition: A | Discharge status: Home / Self Care | Payer: PPO | Source: Ambulatory Visit | Attending: Hematology and Oncology | Admitting: Hematology and Oncology

## 2023-10-16 DIAGNOSIS — I10 Essential (primary) hypertension: Secondary | ICD-10-CM | POA: Insufficient documentation

## 2023-10-16 DIAGNOSIS — Z171 Estrogen receptor negative status [ER-]: Secondary | ICD-10-CM

## 2023-10-16 DIAGNOSIS — C50212 Malignant neoplasm of upper-inner quadrant of left female breast: Secondary | ICD-10-CM | POA: Diagnosis present

## 2023-10-16 DIAGNOSIS — Z0189 Encounter for other specified special examinations: Secondary | ICD-10-CM | POA: Diagnosis not present

## 2023-10-16 LAB — ECHOCARDIOGRAM COMPLETE
AR max vel: 1.95 cm2
AV Area VTI: 1.98 cm2
AV Area mean vel: 2 cm2
AV Mean grad: 5 mm[Hg]
AV Peak grad: 8.5 mm[Hg]
Ao pk vel: 1.46 m/s
Area-P 1/2: 3.63 cm2
S' Lateral: 3 cm

## 2023-10-18 ENCOUNTER — Inpatient Hospital Stay: Payer: PPO | Admitting: Pharmacist

## 2023-10-18 ENCOUNTER — Inpatient Hospital Stay: Payer: PPO

## 2023-10-18 DIAGNOSIS — Z5112 Encounter for antineoplastic immunotherapy: Secondary | ICD-10-CM | POA: Diagnosis not present

## 2023-10-18 DIAGNOSIS — Z171 Estrogen receptor negative status [ER-]: Secondary | ICD-10-CM

## 2023-10-18 DIAGNOSIS — Z95828 Presence of other vascular implants and grafts: Secondary | ICD-10-CM | POA: Insufficient documentation

## 2023-10-18 LAB — CBC WITH DIFFERENTIAL (CANCER CENTER ONLY)
Abs Immature Granulocytes: 0.05 10*3/uL (ref 0.00–0.07)
Basophils Absolute: 0.1 10*3/uL (ref 0.0–0.1)
Basophils Relative: 1 %
Eosinophils Absolute: 0.2 10*3/uL (ref 0.0–0.5)
Eosinophils Relative: 3 %
HCT: 46.8 % — ABNORMAL HIGH (ref 36.0–46.0)
Hemoglobin: 15.9 g/dL — ABNORMAL HIGH (ref 12.0–15.0)
Immature Granulocytes: 1 %
Lymphocytes Relative: 24 %
Lymphs Abs: 1.6 10*3/uL (ref 0.7–4.0)
MCH: 31.5 pg (ref 26.0–34.0)
MCHC: 34 g/dL (ref 30.0–36.0)
MCV: 92.7 fL (ref 80.0–100.0)
Monocytes Absolute: 0.7 10*3/uL (ref 0.1–1.0)
Monocytes Relative: 10 %
Neutro Abs: 4.1 10*3/uL (ref 1.7–7.7)
Neutrophils Relative %: 61 %
Platelet Count: 252 10*3/uL (ref 150–400)
RBC: 5.05 MIL/uL (ref 3.87–5.11)
RDW: 13.4 % (ref 11.5–15.5)
WBC Count: 6.6 10*3/uL (ref 4.0–10.5)
nRBC: 0 % (ref 0.0–0.2)

## 2023-10-18 LAB — CMP (CANCER CENTER ONLY)
ALT: 23 U/L (ref 0–44)
AST: 17 U/L (ref 15–41)
Albumin: 4.4 g/dL (ref 3.5–5.0)
Alkaline Phosphatase: 85 U/L (ref 38–126)
Anion gap: 6 (ref 5–15)
BUN: 16 mg/dL (ref 8–23)
CO2: 29 mmol/L (ref 22–32)
Calcium: 10 mg/dL (ref 8.9–10.3)
Chloride: 105 mmol/L (ref 98–111)
Creatinine: 0.88 mg/dL (ref 0.44–1.00)
GFR, Estimated: >60 mL/min (ref >60)
Glucose, Bld: 90 mg/dL (ref 70–99)
Potassium: 3.9 mmol/L (ref 3.5–5.1)
Sodium: 140 mmol/L (ref 135–145)
Total Bilirubin: 0.9 mg/dL (ref 0.0–1.2)
Total Protein: 6.9 g/dL (ref 6.5–8.1)

## 2023-10-18 MED ORDER — ONDANSETRON HCL 8 MG PO TABS: 8.0000 mg | ORAL_TABLET | Freq: Three times a day (TID) | ORAL | 1 refills | Status: DC | PRN

## 2023-10-18 MED ORDER — DEXAMETHASONE 4 MG PO TABS: ORAL_TABLET | ORAL | 1 refills | Status: DC

## 2023-10-18 MED ORDER — PROCHLORPERAZINE MALEATE 10 MG PO TABS: 10.0000 mg | ORAL_TABLET | Freq: Four times a day (QID) | ORAL | 1 refills | Status: DC | PRN

## 2023-10-18 MED ORDER — HEPARIN SOD (PORK) LOCK FLUSH 100 UNIT/ML IV SOLN
500.0000 [IU] | Freq: Once | INTRAVENOUS | Status: AC
  Administered 2023-10-18: 500 [IU]

## 2023-10-18 MED ORDER — SODIUM CHLORIDE 0.9% FLUSH
10.0000 mL | Freq: Once | INTRAVENOUS | Status: AC
  Administered 2023-10-18: 10 mL

## 2023-10-18 MED ORDER — LIDOCAINE-PRILOCAINE 2.5-2.5 % EX CREA: TOPICAL_CREAM | CUTANEOUS | 3 refills | Status: DC

## 2023-10-18 NOTE — Progress Notes (Signed)
 [facility]       Telephone: [phone]?Fax: [phone]   Oncology Clinical Pharmacist Practitioner Initial Assessment  Rebecca Steele is a 73 y.o. female with a diagnosis of breast cancer. They were contacted today via in-person visit.  Indication/Regimen TCHP: Trastuzumab  (Herceptin ) and pertuzumab  (Perjeta ) and docetaxel  (Taxotere ) and carboplatin  (Paraplatin ) are being used appropriately for treatment of breast cancer by Dr. Murleen Arms.      Wt Readings from Last 1 Encounters:  10/12/23 197 lb 1.5 oz (89.4 kg)    Estimated body surface area is 2.03 meters squared as calculated from the following:   Height as of 10/12/23: 5' 5.5" (1.664 m).   Weight as of 10/12/23: 197 lb 1.5 oz (89.4 kg).  The dosing regimen is every 21 days for 6 cycles  Trastuzumab  (8 mg/kg load, 6 mg/kg maintenance) on Day 1 Pertuzumab  (840 mg load, 420 mg maintenance) on Day 1 Docetaxel  (75 mg/m2) on Day 1 Carboplatin  (AUC 6) on Day 1 Pegfilgrastim  (6 mg) on Day 3  Dose Modifications Dr. Arno Bibles is using an AUC of 5 for carboplatin    Allergies Allergies  Allergen Reactions   Gadolinium Derivatives Anaphylaxis and Shortness Of Breath    Pt was given 17ml multihance  for MRI. After about 18min30 seconds she squeezed the ball saying she felt like her throat was closing up. Exam was DC'd and radiologist gave 75mg  benadryl  IV.    Other     Pt states she was given Gabapentin  and Fentanyl  for anesthesia during recent surgery and was unable to wake up for several hours.  Pt states she does not want to have sedation with those medications in the future.   Valium Anaphylaxis   Paxlovid [Nirmatrelvir-Ritonavir] Hives   Doxycycline  Swelling    Facial swelling Pt has since had without problem   Fentanyl  Other (See Comments)    Slept the whole time for few days   Iohexol  Itching     Desc: Pt. stated she had iv contrast around 25 yrs ago and had itching on her neck.  she took benadryl   and it went away.  The rad recommended premeds and be done tomorrow but the pt.did not want to do that so we did her w/o iv contrast today., Onset Date: [id number]    Macrodantin Nausea And Vomiting   Red Dye #40 (Allura Red) Swelling    Facial swelling (cannot take pink or red tablets or capsules)   Rocephin [Ceftriaxone] Other (See Comments)    Joint aches   Zithromax [Azithromycin] Other (See Comments)    Weakness, cold/clammy, legs trembling    Vitals: No vitals or labs were done today for this chemotherapy education visit     10/12/2023   10:13 AM 10/12/2023    9:44 AM 10/12/2023    9:30 AM  Oncology Vitals  Temp 97.1 F (36.2 C)    Pulse Rate 83 81 74  BP 136/78 142/80 102/67  Resp 16 14 15   SpO2 97 % 94 % 99 %     Laboratory Data    Latest Ref Rng & Units 10/09/2023    3:46 PM 10/06/2021   10:00 AM 10/05/2021   12:36 AM  CBC EXTENDED  WBC 4.0 - 10.5 K/uL 6.9  8.9  7.2   RBC 3.87 - 5.11 MIL/uL 5.02  4.51  4.86   Hemoglobin 12.0 - 15.0 g/dL 95.2  84.1  32.4   HCT 36.0 - 46.0 % 46.4  40.2  43.8  Platelets 150 - 400 K/uL 229  196  211   NEUT# 1.7 - 7.7 K/uL 4.3  6.8    Lymph# 0.7 - 4.0 K/uL 1.7  1.2         Latest Ref Rng & Units 10/09/2023    3:46 PM 10/06/2021   10:00 AM 10/05/2021   12:36 AM  CMP  Glucose 70 - 99 mg/dL 960  454  098   BUN 8 - 23 mg/dL 15  8  12    Creatinine 0.44 - 1.00 mg/dL 1.19  1.47  8.29   Sodium 135 - 145 mmol/L 139  140  137   Potassium 3.5 - 5.1 mmol/L 3.9  3.1  3.5   Chloride 98 - 111 mmol/L 106  107  105   CO2 22 - 32 mmol/L 28  26  23    Calcium  8.9 - 10.3 mg/dL 9.7  8.8  8.7   Total Protein 6.5 - 8.1 g/dL 6.7     Total Bilirubin 0.0 - 1.2 mg/dL 0.5     Alkaline Phos 38 - 126 U/L 83     AST 15 - 41 U/L 16     ALT 0 - 44 U/L 19      Lab Results  Component Value Date   MG 1.9 10/06/2021   MG 2.1 10/05/2021   Contraindications Contraindications were reviewed? Yes Contraindications to therapy were identified? No   Safety Precautions  (written information also provided) The following safety precautions for the use of TCHP were reviewed:  Decreased hemoglobin, part of the red blood cells that carry iron and oxygen Decreased platelet count and increased risk of bleeding Decreased white blood cells (WBCs) and increased risk for infection Fever: reviewed the importance of having a thermometer and the Centers for Disease Control and Prevention (CDC) definition of fever which is 100.74F (38C) or higher. Patient should call 24/7 triage at [phone] if experiencing a fever or any other symptoms Hair Loss Muscle or joint pain or weakness Fatigue Nausea or vomiting Mouth sores Diarrhea Irregular menses (if applicable) Peripheral neuropathy: numbness or tingling in hands and feet Fluid retention or swelling (edema) Nail Changes Rash or itchy skin Fluid retention or swelling (edema) Taste changes Changes in kidney function Changes in electrolytes and other laboratory values (low potassium, low magnesium) Cardiotoxicity from trastuzumab  Infusion reactions Pneumonitis from trastuzumab  Docetaxel  irritating veins Liver toxicity from docetaxel  Docetaxel  can cause eye pain, blurred vision, tearing, and light sensitivity Handling body fluids and waste Intimacy, sexual activity, contraception, fertility  Medication Reconciliation Current Outpatient Medications  Medication Sig Dispense Refill   Evolocumab (REPATHA [place]) Inject into the skin.     levothyroxine  (SYNTHROID ) 25 MCG tablet Take 25 mcg by mouth daily before breakfast.     loratadine  (CLARITIN ) 10 MG tablet Take 10 mg by mouth daily.     montelukast  (SINGULAIR ) 10 MG tablet Take 10 mg by mouth at bedtime.     traZODone  (DESYREL ) 100 MG tablet Take 100 mg by mouth at bedtime.     butalbital -acetaminophen -caffeine  (FIORICET) 50-325-40 MG tablet Take 1 tablet by mouth 2 (two) times daily as needed for headache or migraine. (Patient not taking: Reported on 10/18/2023)      cholecalciferol (VITAMIN D3) 25 MCG (1000 UNIT) tablet Take 1,000 Units by mouth daily. (Patient not taking: Reported on 10/18/2023)     dexamethasone  (DECADRON ) 4 MG tablet Take 2 tabs by mouth 2 times daily starting day before chemo. Then take 2 tabs daily for 2  days starting day after chemo. Take with food. (Patient not taking: Reported on 10/18/2023) 30 tablet 1   lidocaine -prilocaine  (EMLA ) cream Apply to affected area once (Patient not taking: Reported on 10/18/2023) 30 g 3   ondansetron  (ZOFRAN ) 8 MG tablet Take 1 tablet (8 mg total) by mouth every 8 (eight) hours as needed for nausea or vomiting. Start on the third day after chemotherapy. (Patient not taking: Reported on 10/18/2023) 30 tablet 1   prochlorperazine  (COMPAZINE ) 10 MG tablet Take 1 tablet (10 mg total) by mouth every 6 (six) hours as needed for nausea or vomiting. (Patient not taking: Reported on 10/18/2023) 30 tablet 1   No current facility-administered medications for this visit.   Medication reconciliation is based on the patient's most recent medication list in the electronic medical record (EMR) including herbal products and OTC medications.   The patient's medication list was reviewed today with the patient? Yes   Drug-drug interactions (DDIs) DDIs were evaluated? Yes Significant DDIs identified? No   Drug-Food Interactions Drug-food interactions were evaluated? Yes Drug-food interactions identified? No   Follow-up Plan  Treatment start date: 10/20/23 Port placement date: 10/12/23 ECHO date: 10/16/23 We reviewed the prescriptions, premedications, and treatment regimen with the patient. Possible side effects of the treatment regimen were reviewed and management strategies were discussed. Can use loperamide as needed for diarrhea, loratadine  as needed for G-CSF bone pain, and Senna-S as needed for constipation.  Clinical pharmacy will assist Dr. Praveena Iruku and Anatasia M Sinatra on an as needed basis going  forward  [name] participated in the discussion, expressed understanding, and voiced agreement with the above plan. All questions were answered to her satisfaction. The patient was advised to contact the clinic at [phone] with any questions or concerns prior to her return visit.   I spent 60 minutes assessing the patient.  Kayshaun Polanco A. Webb Hake, PharmD, BCOP, CPP  Althea Atkinson, RPH-CPP, 10/18/2023 9:40 AM  **Disclaimer: This note was dictated with voice recognition software. Similar sounding words can inadvertently be transcribed and this note may contain transcription errors which may not have been corrected upon publication of note.**

## 2023-10-19 ENCOUNTER — Other Ambulatory Visit: Payer: PPO

## 2023-10-19 ENCOUNTER — Inpatient Hospital Stay (HOSPITAL_BASED_OUTPATIENT_CLINIC_OR_DEPARTMENT_OTHER): Payer: PPO | Admitting: Hematology and Oncology

## 2023-10-19 VITALS — BP 134/77 | HR 84 | Temp 97.7°F | Resp 16 | Wt 196.0 lb

## 2023-10-19 DIAGNOSIS — Z5112 Encounter for antineoplastic immunotherapy: Secondary | ICD-10-CM | POA: Diagnosis not present

## 2023-10-19 DIAGNOSIS — Z171 Estrogen receptor negative status [ER-]: Secondary | ICD-10-CM

## 2023-10-19 DIAGNOSIS — C50212 Malignant neoplasm of upper-inner quadrant of left female breast: Secondary | ICD-10-CM

## 2023-10-19 MED FILL — Fosaprepitant Dimeglumine For IV Infusion 150 MG (Base Eq): INTRAVENOUS | Qty: 5 | Status: AC

## 2023-10-19 NOTE — Progress Notes (Signed)
[facility] CONSULT NOTE  Patient Care Team: Malka So., MD as PCP - General (Internal Medicine) [name], MD as Consulting Physician (General Surgery) Harold Hedge, MD as Consulting Physician (Obstetrics and Gynecology) Dorena Cookey, MD (Inactive) as Consulting Physician (Gastroenterology) Rachel Moulds, MD as Consulting Physician (Hematology and Oncology) Pershing Proud, RN as Oncology Nurse Navigator Donnelly Angelica, RN as Oncology Nurse Navigator  CHIEF COMPLAINTS/PURPOSE OF CONSULTATION:  Newly diagnosed breast cancer  HISTORY OF PRESENTING ILLNESS:  Rebecca Steele 73 y.o. female is here because of recent diagnosis of left breast cancer  I reviewed her records extensively and collaborated the history with the patient.  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)  09/22/2023 Mammogram   Patient self palpated a breast mass in her left breast around November 2019 went for diagnostic mammogram, this showed there is a developing density on mammogram, 2 of the lower left axillary lymph nodes exhibit increased cortical thickness, ultrasound confirmed a 2.7 cm irregular hypoechoic mass in left breast at 12:00 5 cm from the nipple along with abnormal left axillary lymph   09/29/2023 Pathology Results   Left breast central superior needle core biopsy 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 She recently had a port placed for chemotherapy administration, which went well, although she had difficulty with IV placement requiring multiple attempts. The patient's recent echocardiogram and lab tests were normal. The patient has a history of depression and was previously on Zoloft, which she stopped recently. She reports feeling good currently, but has experienced moments of sadness when discussing her diagnosis. Rest of the pertinent 10 point ROS reviewed and neg.  MEDICAL HISTORY:  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    SURGICAL HISTORY: Past Surgical History:  Procedure Laterality Date   APPENDECTOMY     BREAST BIOPSY Right 09/16/2022   MM RT BREAST BX W LOC DEV 1ST LESION IMAGE BX SPEC STEREO GUIDE 09/16/2022 [facility]   BREAST BIOPSY  12/06/2022   MM RT RADIOACTIVE SEED LOC MAMMO GUIDE 12/06/2022 [facility]   BREAST EXCISIONAL BIOPSY Right 2018   CHOLECYSTECTOMY     CYSTOSCOPY W/ RETROGRADES  KYPHOPLASTY N/A 10/05/2021   Procedure: LUMBAR ONE KYPHOPLASTY;  Surgeon: Jadene Pierini, MD;  Location: [facility];  Service: Neurosurgery;  Laterality: N/A;   NECK SURGERY     PORTACATH PLACEMENT Right 10/12/2023   Procedure: INSERTION PORT-A-CATH WITH GUIDED ULTRASOUND;  Surgeon: [name], MD;  Location: [facility];  Service:  General;  Laterality: Right;   RADIOACTIVE SEED GUIDED EXCISIONAL BREAST BIOPSY Right 12/12/2017   Procedure: RIGHT RADIOACTIVE SEED GUIDED EXCISIONAL BREAST BIOPSY ERAS PATHWAY;  Surgeon: [name], MD;  Location: [facility];  Service: General;  Laterality: Right;  LMA   RADIOACTIVE SEED GUIDED EXCISIONAL BREAST BIOPSY Right 12/07/2022   Procedure: RADIOACTIVE SEED GUIDED EXCISIONAL RIGHT BREAST BIOPSY;  Surgeon: [name], MD;  Location: [facility];  Service: General;  Laterality: Right;   SHOULDER SURGERY     TONSILLECTOMY     TOTAL HIP ARTHROPLASTY Left 06/04/2021   Procedure: TOTAL HIP ARTHROPLASTY ANTERIOR APPROACH;  Surgeon: Jodi Geralds, MD;  Location: [facility];  Service: Orthopedics;  Laterality: Left;   TRIGGER FINGER RELEASE Right 05/18/2015   Procedure: RIGHT  LONG FINGER TRIGGER RELEASE ;  Surgeon: Mack Hook, MD;  Location: [facility];  Service: Orthopedics;  Laterality: Right;    SOCIAL HISTORY: Social History   Socioeconomic History   Marital status: Married    Spouse name: Not on file   Number of children: Not on file   Years of education: Not on file   Highest education level: Not on file  Occupational History   Not on file  Tobacco Use   Smoking status: Never   Smokeless tobacco: Never  Vaping Use   Vaping status: Never Used  Substance and Sexual Activity   Alcohol use: No   Drug use: No   Sexual activity: Not on file  Other Topics Concern   Not on file  Social History Narrative   Not on file   Social Drivers of Health   Financial Resource Strain: Low Risk  (04/24/2023)   Received [facility]   Overall Financial Resource Strain (CARDIA)    Difficulty of Paying Living Expenses: Not hard at all  Food Insecurity: No Food Insecurity (04/24/2023)   Received [facility]   Hunger Vital Sign    Worried About Running Out of Food in the Last Year: Never true    Ran Out of Food in the  Last Year: Never true  Transportation Needs: No Transportation Needs (04/24/2023)   Received from [facility] - Transportation    Lack of Transportation (Medical): No    Lack of Transportation (Non-Medical): No  Physical Activity: Inactive (04/24/2023)   [facility]   Exercise Vital Sign    Days of Exercise per Week: 0 days    Minutes of Exercise per Session: 10 min  Stress: No Stress Concern Present (04/24/2023)   Received from [facility] - Occupational Stress Questionnaire    Feeling of Stress : Not at all  Social Connections: Socially Integrated (04/24/2023)   [facility] from [facility]   Social Network    How would you rate your social network (family, work, friends)?: Good participation with social networks  Intimate Partner Violence: Not At Risk (04/24/2023)   Received from [facility]   HITS    Over the last 12 months how often did your partner physically hurt you?: Never    Over the last 12  months how often did your partner insult you or talk down to you?: Never    Over the last 12 months how often did your partner threaten you with physical harm?: Never    Over the last 12 months how often did your partner scream or curse at you?: Never    FAMILY HISTORY: Family History  Problem Relation Age of Onset   Breast cancer Maternal Aunt        44s    ALLERGIES:  is allergic to gadolinium derivatives, other, valium, paxlovid [nirmatrelvir-ritonavir], doxycycline, fentanyl, iohexol, macrodantin, red dye #40 (allura red), rocephin [ceftriaxone], and zithromax [azithromycin].  MEDICATIONS:  Current Outpatient Medications  Medication Sig Dispense Refill   butalbital-acetaminophen-caffeine (FIORICET) 50-325-40 MG tablet Take 1 tablet by mouth 2 (two) times daily as needed for headache or migraine. (Patient not taking: Reported on 10/18/2023)     cholecalciferol (VITAMIN D3) 25 MCG (1000 UNIT) tablet Take 1,000  Units by mouth daily. (Patient not taking: Reported on 10/18/2023)     dexamethasone (DECADRON) 4 MG tablet Take 2 tabs by mouth 2 times daily starting day before chemo. Then take 2 tabs daily for 2 days starting day after chemo. Take with food. (Patient not taking: Reported on 10/18/2023) 30 tablet 1   Evolocumab (REPATHA Devon) Inject into the skin.     levothyroxine (SYNTHROID) 25 MCG tablet Take 25 mcg by mouth daily before breakfast.     lidocaine-prilocaine (EMLA) cream Apply to affected area once (Patient not taking: Reported on 10/18/2023) 30 g 3   loratadine (CLARITIN) 10 MG tablet Take 10 mg by mouth daily.     montelukast (SINGULAIR) 10 MG tablet Take 10 mg by mouth at bedtime.     ondansetron (ZOFRAN) 8 MG tablet Take 1 tablet (8 mg total) by mouth every 8 (eight) hours as needed for nausea or vomiting. Start on the third day after chemotherapy. (Patient not taking: Reported on 10/18/2023) 30 tablet 1   prochlorperazine (COMPAZINE) 10 MG tablet Take 1 tablet (10 mg total) by mouth every 6 (six) hours as needed for nausea or vomiting. (Patient not taking: Reported on 10/18/2023) 30 tablet 1   traZODone (DESYREL) 100 MG tablet Take 100 mg by mouth at bedtime.     No current facility-administered medications for this visit.    REVIEW OF SYSTEMS:   Constitutional: Denies fevers, chills or abnormal night sweats Eyes: Denies blurriness of vision, double vision or watery eyes Ears, nose, mouth, throat, and face: Denies mucositis or sore throat Respiratory: Denies cough, dyspnea or wheezes Cardiovascular: Denies palpitation, chest discomfort or lower extremity swelling Gastrointestinal:  Denies nausea, heartburn or change in bowel habits Skin: Denies abnormal skin rashes Lymphatics: Denies new lymphadenopathy or easy bruising Neurological:Denies numbness, tingling or new weaknesses Behavioral/Psych: Mood is stable, no new changes  Breast: As mentioned above All other systems were reviewed with  the patient and are negative.  PHYSICAL EXAMINATION: ECOG PERFORMANCE STATUS: 0 - Asymptomatic  Vitals:   10/19/23 1008  BP: 134/77  Pulse: 84  Resp: 16  Temp: 97.7 F (36.5 C)  SpO2: 95%   Filed Weights   10/19/23 1008  Weight: 196 lb (88.9 kg)    GENERAL:alert, no distress and comfortable   LABORATORY DATA:  I have reviewed the data as listed Lab Results  Component Value Date   WBC 6.6 10/18/2023   HGB 15.9 (H) 10/18/2023   HCT 46.8 (H) 10/18/2023   MCV 92.7 10/18/2023   PLT 252 10/18/2023  Lab Results  Component Value Date   NA 140 10/18/2023   K 3.9 10/18/2023   CL 105 10/18/2023   CO2 29 10/18/2023    RADIOGRAPHIC STUDIES: I have personally reviewed the radiological reports and agreed with the findings in the report.  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  Invasive Ductal Carcinoma, Left Breast Patient is about to start chemotherapy. Discussed potential side effects including diarrhea, bone pain, and fevers/chills. Patient has a port in place for treatment. -Start chemotherapy tomorrow. -Check labs next Friday and APP follow up for tox check. -Plan follow-up with me before second round of chemotherapy.  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 Benadryl, which contains red dye, will not be used in treatment. -Removed it from the treatment plan  Depression Patient has a history of depression and was previously on Zoloft. She has recently stopped taking it but is considering restarting due to upcoming chemotherapy. -Advise patient to restart Zoloft (50mg ) during chemotherapy to help stabilize mood.  Hair Loss from  Chemotherapy Patient is concerned about potential hair loss from chemotherapy and is interested in getting a wig. -Send prescription for wig to "[facility]". -Patient to visit "[facility]" to choose a suitable wig.  General Health Maintenance / Followup Plans -Next appointment scheduled for next Friday with labs same day. -Follow-up appointment before cycle two day one of chemotherapy.    All questions were answered. The patient knows to call the clinic with any problems, questions or concerns.    Rachel Moulds, MD 10/19/23

## 2023-10-19 NOTE — Assessment & Plan Note (Signed)
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  Invasive Ductal Carcinoma, Left Breast Patient is about to start chemotherapy. Discussed potential side effects including diarrhea, bone pain, and fevers/chills. Patient has a port in place for treatment. -Start chemotherapy tomorrow. -Check labs next Friday and APP follow up for tox check. -Plan follow-up with me before second round of chemotherapy.  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 Benadryl, which contains red dye, will not be used in treatment. -Removed it from the treatment plan  Depression Patient has a history of depression and was previously on Zoloft. She has recently stopped taking it but is considering restarting due to upcoming chemotherapy. -Advise patient to restart Zoloft (50mg ) during chemotherapy to help stabilize mood.  Hair Loss from Chemotherapy Patient is concerned about potential hair loss from chemotherapy and is interested in getting a wig. -Send prescription for wig to "[facility] -Patient to visit "[facility]" to choose a suitable wig.  General Health Maintenance / Followup Plans -Next appointment scheduled for next Friday with labs same day. -Follow-up appointment before cycle two day one of chemotherapy.

## 2023-10-20 ENCOUNTER — Inpatient Hospital Stay: Payer: PPO

## 2023-10-20 ENCOUNTER — Encounter: Payer: Self-pay | Admitting: *Deleted

## 2023-10-20 ENCOUNTER — Other Ambulatory Visit: Payer: Self-pay

## 2023-10-20 ENCOUNTER — Inpatient Hospital Stay: Payer: PPO | Admitting: Licensed Clinical Social Worker

## 2023-10-20 VITALS — BP 138/74 | HR 85 | Temp 98.0°F | Resp 16

## 2023-10-20 DIAGNOSIS — Z5112 Encounter for antineoplastic immunotherapy: Secondary | ICD-10-CM | POA: Diagnosis not present

## 2023-10-20 DIAGNOSIS — Z171 Estrogen receptor negative status [ER-]: Secondary | ICD-10-CM

## 2023-10-20 MED ORDER — HEPARIN SOD (PORK) LOCK FLUSH 100 UNIT/ML IV SOLN
500.0000 [IU] | Freq: Once | INTRAVENOUS | Status: AC | PRN
  Administered 2023-10-20: 500 [IU]

## 2023-10-20 MED ORDER — SODIUM CHLORIDE 0.9 % IV SOLN
60.0000 mg/m2 | Freq: Once | INTRAVENOUS | Status: AC
Start: 2023-10-20 — End: 2023-10-20
  Administered 2023-10-20: 122 mg via INTRAVENOUS
  Filled 2023-10-20: qty 12.2

## 2023-10-20 MED ORDER — SODIUM CHLORIDE 0.9 % IV SOLN
150.0000 mg | Freq: Once | INTRAVENOUS | Status: AC
  Administered 2023-10-20: 150 mg via INTRAVENOUS
  Filled 2023-10-20: qty 150

## 2023-10-20 MED ORDER — SODIUM CHLORIDE 0.9% FLUSH
10.0000 mL | INTRAVENOUS | Status: DC | PRN
  Administered 2023-10-20: 10 mL

## 2023-10-20 MED ORDER — SODIUM CHLORIDE 0.9 % IV SOLN
840.0000 mg | Freq: Once | INTRAVENOUS | Status: AC
  Administered 2023-10-20: 840 mg via INTRAVENOUS
  Filled 2023-10-20: qty 28

## 2023-10-20 MED ORDER — SODIUM CHLORIDE 0.9 % IV SOLN: INTRAVENOUS | Status: DC

## 2023-10-20 MED ORDER — PALONOSETRON HCL INJECTION 0.25 MG/5ML
0.2500 mg | Freq: Once | INTRAVENOUS | Status: AC
  Administered 2023-10-20: 0.25 mg via INTRAVENOUS
  Filled 2023-10-20: qty 5

## 2023-10-20 MED ORDER — DEXAMETHASONE SODIUM PHOSPHATE 10 MG/ML IJ SOLN
10.0000 mg | Freq: Once | INTRAMUSCULAR | Status: AC
  Administered 2023-10-20: 10 mg via INTRAVENOUS
  Filled 2023-10-20: qty 1

## 2023-10-20 MED ORDER — TRASTUZUMAB-ANNS CHEMO 150 MG IV SOLR
8.0000 mg/kg | Freq: Once | INTRAVENOUS | Status: AC
  Administered 2023-10-20: 714 mg via INTRAVENOUS
  Filled 2023-10-20: qty 34

## 2023-10-20 MED ORDER — CETIRIZINE HCL 10 MG/ML IV SOLN
10.0000 mg | Freq: Once | INTRAVENOUS | Status: AC
  Administered 2023-10-20: 10 mg via INTRAVENOUS
  Filled 2023-10-20: qty 1

## 2023-10-20 MED ORDER — ACETAMINOPHEN 325 MG PO TABS
650.0000 mg | ORAL_TABLET | Freq: Once | ORAL | Status: AC
Start: 2023-10-20 — End: 2023-10-20
  Administered 2023-10-20: 650 mg via ORAL
  Filled 2023-10-20: qty 2

## 2023-10-20 MED ORDER — SODIUM CHLORIDE 0.9 % IV SOLN
485.5000 mg | Freq: Once | INTRAVENOUS | Status: AC
  Administered 2023-10-20: 490 mg via INTRAVENOUS
  Filled 2023-10-20: qty 49

## 2023-10-20 NOTE — Progress Notes (Signed)
Patient tolerated her docetaxel well overall, but did have a very short episode where her temperature spiked a little and she felt warm. Her blood pressure dropped from about 135 SBP to 109 SBP. Infusion was on hold for about 2 minutes and she went back to baseline with no other issues. Occurred at 75% bump up

## 2023-10-20 NOTE — Progress Notes (Signed)
Add Cetirizine 10 mg IV x 1 to treatment plan as premedication.  Contains no red dye.  Added to plan.  T.O.Dr Evelina Dun, PharmD

## 2023-10-20 NOTE — Progress Notes (Signed)
[facility] Clinical Social Work  Initial Assessment   Ame DONNETTE SILVERIO is a 73 y.o. year old female accompanied by husband. Clinical Social Work was referred by new patient protocol for assessment of psychosocial needs.   SDOH (Social Determinants of Health) assessments performed: Yes SDOH Interventions    Flowsheet Row Clinical Support from 10/20/2023 in [facility] - A Dept Of [place]  SDOH Interventions   Food Insecurity Interventions Intervention Not Indicated  Housing Interventions Intervention Not Indicated  Transportation Interventions Intervention Not Indicated  Utilities Interventions Intervention Not Indicated       SDOH Screenings   Food Insecurity: No Food Insecurity (10/20/2023)  Housing: Low Risk  (10/20/2023)  Transportation Needs: No Transportation Needs (10/20/2023)  Utilities: Not At Risk (10/20/2023)  Financial Resource Strain: Low Risk  (04/24/2023)   Received from [facility]  Physical Activity: Inactive (04/24/2023)   [facility]  Social Connections: Socially Integrated (04/24/2023)   [facility]  Stress: No Stress Concern Present (04/24/2023)   Received from [facility]  Tobacco Use: Low Risk  (10/12/2023)     Distress Screen completed: No     No data to display            Family/Social Information:  Housing Arrangement: patient lives with husband Family members/support persons in your life? Family, Friends, and [employer] Financial concerns: no  Employment: Retired .  Income source: Actor concerns: No Type of concern: None Food access concerns: no Religious or spiritual practice: Yes-involved in [employer]. 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 yes, receiving first infusion today. Is nervous  but overall positive. Trying to stay away from anyone who is negative/ doom and gloom Concerns about diagnosis and/or treatment: Overwhelmed by information Patient reported stressors: Adjusting to my illness Patient enjoys time with family/ friends and choirs at [employer] Current coping skills/ strengths: Manufacturing systems engineer , Religious Affiliation , Special hobby/interest , and Supportive family/friends     SUMMARY: Current SDOH Barriers:  No major barriers noted today  Clinical Social Work Clinical Goal(s):  No clinical social work goals at this time  Interventions: Discussed common feeling and emotions when being diagnosed with cancer, and the importance of support during treatment Informed patient of the support team roles and support services at [facility] Provided CSW contact information and encouraged patient to call with any questions or concerns   Follow Up Plan: Patient will contact CSW with any support or resource needs Patient verbalizes understanding of plan: Yes    Maeci Kalbfleisch E Johnrobert Foti, LCSW Clinical Social Worker [facility]

## 2023-10-20 NOTE — Patient Instructions (Signed)
[facility] - A DEPT OF [facility]  Discharge Instructions: Thank you for choosing [facility] to provide your oncology and hematology care.   If you have a lab appointment with the Cancer Center, please go directly to the Cancer Center and check in at the registration area.   Wear comfortable clothing and clothing appropriate for easy access to any Portacath or PICC line.   We strive to give you quality time with your provider. You may need to reschedule your appointment if you arrive late (15 or more minutes).  Arriving late affects you and other patients whose appointments are after yours.  Also, if you miss three or more appointments without notifying the office, you may be dismissed from the clinic at the provider's discretion.      For prescription refill requests, have your pharmacy contact our office and allow 72 hours for refills to be completed.    Today you received the following chemotherapy and/or immunotherapy agents Trastuzumab (Kanjinti), Pertuzumab (Perjeta), Docetaxel (Taxotere) and Carboplatin      To help prevent nausea and vomiting after your treatment, we encourage you to take your nausea medication as directed.  BELOW ARE SYMPTOMS THAT SHOULD BE REPORTED IMMEDIATELY: *FEVER GREATER THAN 100.4 F (38 C) OR HIGHER *CHILLS OR SWEATING *NAUSEA AND VOMITING THAT IS NOT CONTROLLED WITH YOUR NAUSEA MEDICATION *UNUSUAL SHORTNESS OF BREATH *UNUSUAL BRUISING OR BLEEDING *URINARY PROBLEMS (pain or burning when urinating, or frequent urination) *BOWEL PROBLEMS (unusual diarrhea, constipation, pain near the anus) TENDERNESS IN MOUTH AND THROAT WITH OR WITHOUT PRESENCE OF ULCERS (sore throat, sores in mouth, or a toothache) UNUSUAL RASH, SWELLING OR PAIN  UNUSUAL VAGINAL DISCHARGE OR ITCHING   Items with * indicate a potential emergency and should be followed up as soon as possible or go to the Emergency Department if any problems  should occur.  Please show the CHEMOTHERAPY ALERT CARD or IMMUNOTHERAPY ALERT CARD at check-in to the Emergency Department and triage nurse.  Should you have questions after your visit or need to cancel or reschedule your appointment, please contact [facility] - A DEPT OF [facility]  Dept: [phone]  and follow the prompts.  Office hours are 8:00 a.m. to 4:30 p.m. Monday - Friday. Please note that voicemails left after 4:00 p.m. may not be returned until the following business day.  We are closed weekends and major holidays. You have access to a nurse at all times for urgent questions. Please call the main number to the clinic Dept: [phone] and follow the prompts.   For any non-urgent questions, you may also contact your provider using MyChart. We now offer e-Visits for anyone 3 and older to request care online for non-urgent symptoms. For details visit [email or web address].   Also download the MyChart app! Go to the app store, search "MyChart", open the app, select [place], and log in with your MyChart username and password.  Trastuzumab Injection What is this medication? TRASTUZUMAB (tras TOO zoo mab) treats breast cancer and stomach cancer. It works by blocking a protein that causes cancer cells to grow and multiply. This helps to slow or stop the spread of cancer cells. This medicine may be used for other purposes; ask your health care provider or pharmacist if you have questions. COMMON BRAND NAME(S): Herceptin, HERCESSI, Herzuma, KANJINTI, Ogivri, Ontruzant, Trazimera What should I tell my care team before I take this medication? They need  to know if you have any of these conditions: Heart failure Lung disease An unusual or allergic reaction to trastuzumab, other medications, foods, dyes, or preservatives Pregnant or trying to get pregnant Breast-feeding How should I use this medication? This medication is injected into a vein. It is  given by your care team in a hospital or clinic setting. Talk to your care team about the use of this medication in children. It is not approved for use in children. Overdosage: If you think you have taken too much of this medicine contact a poison control center or emergency room at once. NOTE: This medicine is only for you. Do not share this medicine with others. What if I miss a dose? Keep appointments for follow-up doses. It is important not to miss your dose. Call your care team if you are unable to keep an appointment. What may interact with this medication? Certain types of chemotherapy, such as daunorubicin, doxorubicin, epirubicin, idarubicin This list may not describe all possible interactions. Give your health care provider a list of all the medicines, herbs, non-prescription drugs, or dietary supplements you use. Also tell them if you smoke, drink alcohol, or use illegal drugs. Some items may interact with your medicine. What should I watch for while using this medication? Your condition will be monitored carefully while you are receiving this medication. This medication may make you feel generally unwell. This is not uncommon, as chemotherapy affects healthy cells as well as cancer cells. Report any side effects. Continue your course of treatment even though you feel ill unless your care team tells you to stop. This medication may increase your risk of getting an infection. Call your care team for advice if you get a fever, chills, sore throat, or other symptoms of a cold or flu. Do not treat yourself. Try to avoid being around people who are sick. Avoid taking medications that contain aspirin, acetaminophen, ibuprofen, naproxen, or ketoprofen unless instructed by your care team. These medications can hide a fever. Talk to your care team if you may be pregnant. Serious birth defects can occur if you take this medication during pregnancy and for 7 months after the last dose. You will need a  negative pregnancy test before starting this medication. Contraception is recommended while taking this medication and for 7 months after the last dose. Your care team can help you find the option that works for you. Do not breastfeed while taking this medication and for 7 months after stopping treatment. What side effects may I notice from receiving this medication? Side effects that you should report to your care team as soon as possible: Allergic reactions or angioedema--skin rash, itching or hives, swelling of the face, eyes, lips, tongue, arms, or legs, trouble swallowing or breathing Dry cough, shortness of breath or trouble breathing Heart failure--shortness of breath, swelling of the ankles, feet, or hands, sudden weight gain, unusual weakness or fatigue Infection--fever, chills, cough, or sore throat Infusion reactions--chest pain, shortness of breath or trouble breathing, feeling faint or lightheaded Side effects that usually do not require medical attention (report to your care team if they continue or are bothersome): Diarrhea Dizziness Headache Nausea Trouble sleeping Vomiting This list may not describe all possible side effects. Call your doctor for medical advice about side effects. You may report side effects to FDA at 1-800-FDA-1088. Where should I keep my medication? This medication is given in a hospital or clinic. It will not be stored at home. NOTE: This sheet is a summary.  It may not cover all possible information. If you have questions about this medicine, talk to your doctor, pharmacist, or health care provider.  2024 Elsevier/Gold Standard (2022-02-01 00:00:00)  Pertuzumab Injection What is this medication? PERTUZUMAB (per TOOZ ue mab) treats breast cancer. It works by blocking a protein that causes cancer cells to grow and multiply. This helps to slow or stop the spread of cancer cells. It is a monoclonal antibody. This medicine may be used for other purposes; ask  your health care provider or pharmacist if you have questions. COMMON BRAND NAME(S): PERJETA What should I tell my care team before I take this medication? They need to know if you have any of these conditions: Heart failure An unusual or allergic reaction to pertuzumab, other medications, foods, dyes, or preservatives Pregnant or trying to get pregnant Breast-feeding How should I use this medication? This medication is injected into a vein. It is given by your care team in a hospital or clinic setting. Talk to your care team about the use of this medication in children. Special care may be needed. Overdosage: If you think you have taken too much of this medicine contact a poison control center or emergency room at once. NOTE: This medicine is only for you. Do not share this medicine with others. What if I miss a dose? Keep appointments for follow-up doses. It is important not to miss your dose. Call your care team if you are unable to keep an appointment. What may interact with this medication? Interactions are not expected. This list may not describe all possible interactions. Give your health care provider a list of all the medicines, herbs, non-prescription drugs, or dietary supplements you use. Also tell them if you smoke, drink alcohol, or use illegal drugs. Some items may interact with your medicine. What should I watch for while using this medication? Your condition will be monitored carefully while you are receiving this medication. This medication may make you feel generally unwell. This is not uncommon as chemotherapy can affect healthy cells as well as cancer cells. Report any side effects. Continue your course of treatment even though you feel ill unless your care team tells you to stop. Talk to your care team if you may be pregnant. Serious birth defects can occur if you take this medication during pregnancy and for 7 months after the last dose. You will need a negative pregnancy  test before starting this medication. Contraception is recommended while taking this medication and for 7 months after the last dose. Your care team can help you find the option that works for you. Do not breastfeed while taking this medication and for 7 months after the last dose. What side effects may I notice from receiving this medication? Side effects that you should report to your care team as soon as possible: Allergic reactions or angioedema--skin rash, itching or hives, swelling of the face, eyes, lips, tongue, arms, or legs, trouble swallowing or breathing Heart failure--shortness of breath, swelling of the ankles, feet, or hands, sudden weight gain, unusual weakness or fatigue Infusion reactions--chest pain, shortness of breath or trouble breathing, feeling faint or lightheaded Side effects that usually do not require medical attention (report to your care team if they continue or are bothersome): Diarrhea Dry skin Fatigue Hair loss Nausea Vomiting This list may not describe all possible side effects. Call your doctor for medical advice about side effects. You may report side effects to FDA at 1-800-FDA-1088. Where should I keep my medication? This  medication is given in a hospital or clinic. It will not be stored at home. NOTE: This sheet is a summary. It may not cover all possible information. If you have questions about this medicine, talk to your doctor, pharmacist, or health care provider.  2024 Elsevier/Gold Standard (2022-02-01 00:00:00) Docetaxel Injection What is this medication? DOCETAXEL (doe se TAX el) treats some types of cancer. It works by slowing down the growth of cancer cells. This medicine may be used for other purposes; ask your health care provider or pharmacist if you have questions. COMMON BRAND NAME(S): BEIZRAY, Docefrez, Docivyx, Taxotere What should I tell my care team before I take this medication? They need to know if you have any of these  conditions: Kidney disease Liver disease Low white blood cell levels Tingling of the fingers or toes or other nerve disorder An unusual or allergic reaction to docetaxel, polysorbate 80, other medications, foods, dyes, or preservatives Pregnant or trying to get pregnant Breast-feeding How should I use this medication? This medication is injected into a vein. It is given by your care team in a hospital or clinic setting. Talk to your care team about the use of this medication in children. Special care may be needed. Overdosage: If you think you have taken too much of this medicine contact a poison control center or emergency room at once. NOTE: This medicine is only for you. Do not share this medicine with others. What if I miss a dose? Keep appointments for follow-up doses. It is important not to miss your dose. Call your care team if you are unable to keep an appointment. What may interact with this medication? Do not take this medication with any of the following: Live virus vaccines This medication may also interact with the following: Certain antibiotics, such as clarithromycin, telithromycin Certain antivirals for HIV or hepatitis Certain medications for fungal infections, such as itraconazole, ketoconazole, voriconazole Grapefruit juice Nefazodone Supplements, such as St. John's wort This list may not describe all possible interactions. Give your health care provider a list of all the medicines, herbs, non-prescription drugs, or dietary supplements you use. Also tell them if you smoke, drink alcohol, or use illegal drugs. Some items may interact with your medicine. What should I watch for while using this medication? This medication may make you feel generally unwell. This is not uncommon as chemotherapy can affect healthy cells as well as cancer cells. Report any side effects. Continue your course of treatment even though you feel ill unless your care team tells you to stop. You  may need blood work done while you are taking this medication. This medication can cause serious side effects and infusion reactions. To reduce the risk, your care team may give you other medications to take before receiving this one. Be sure to follow the directions from your care team. This medication may increase your risk of getting an infection. Call your care team for advice if you get a fever, chills, sore throat, or other symptoms of a cold or flu. Do not treat yourself. Try to avoid being around people who are sick. Avoid taking medications that contain aspirin, acetaminophen, ibuprofen, naproxen, or ketoprofen unless instructed by your care team. These medications may hide a fever. Be careful brushing or flossing your teeth or using a toothpick because you may get an infection or bleed more easily. If you have any dental work done, tell your dentist you are receiving this medication. Some products may contain alcohol. Ask your care team  if this medication contains alcohol. Be sure to tell all care teams you are taking this medicine. Certain medications, like metronidazole and disulfiram, can cause an unpleasant reaction when taken with alcohol. The reaction includes flushing, headache, nausea, vomiting, sweating, and increased thirst. The reaction can last from 30 minutes to several hours. This medication may affect your coordination, reaction time, or judgement. Do not drive or operate machinery until you know how this medication affects you. Sit up or stand slowly to reduce the risk of dizzy or fainting spells. Drinking alcohol with this medication can increase the risk of these side effects. Talk to your care team about your risk of cancer. You may be more at risk for certain types of cancer if you take this medication. Talk to your care team if you wish to become pregnant or think you might be pregnant. This medication can cause serious birth defects if taken during pregnancy or if you get  pregnant within 2 months after stopping therapy. A negative pregnancy test is required before starting this medication. A reliable form of contraception is recommended while taking this medication and for 2 months after stopping it. Talk to your care team about reliable forms of contraception. Do not breast-feed while taking this medication and for 1 week after stopping therapy. Use a condom during sex and for 4 months after stopping therapy. Tell your care team right away if you think your partner might be pregnant. This medication can cause serious birth defects. This medication may cause infertility. Talk to your care team if you are concerned about your fertility. What side effects may I notice from receiving this medication? Side effects that you should report to your care team as soon as possible: Allergic reactions--skin rash, itching, hives, swelling of the face, lips, tongue, or throat Change in vision such as blurry vision, seeing halos around lights, vision loss Infection--fever, chills, cough, or sore throat Infusion reactions--chest pain, shortness of breath or trouble breathing, feeling faint or lightheaded Low red blood cell level--unusual weakness or fatigue, dizziness, headache, trouble breathing Pain, tingling, or numbness in the hands or feet Painful swelling, warmth, or redness of the skin, blisters or sores at the infusion site Redness, blistering, peeling, or loosening of the skin, including inside the mouth Sudden or severe stomach pain, bloody diarrhea, fever, nausea, vomiting Swelling of the ankles, hands, or feet Tumor lysis syndrome (TLS)--nausea, vomiting, diarrhea, decrease in the amount of urine, dark urine, unusual weakness or fatigue, confusion, muscle pain or cramps, fast or irregular heartbeat, joint pain Unusual bruising or bleeding Side effects that usually do not require medical attention (report to your care team if they continue or are bothersome): Change in  nail shape, thickness, or color Change in taste Hair loss Increased tears This list may not describe all possible side effects. Call your doctor for medical advice about side effects. You may report side effects to FDA at 1-800-FDA-1088. Where should I keep my medication? This medication is given in a hospital or clinic. It will not be stored at home. NOTE: This sheet is a summary. It may not cover all possible information. If you have questions about this medicine, talk to your doctor, pharmacist, or health care provider.  2024 Elsevier/Gold Standard (2021-11-25 00:00:00) Carboplatin Injection What is this medication? CARBOPLATIN (KAR boe pla tin) treats some types of cancer. It works by slowing down the growth of cancer cells. This medicine may be used for other purposes; ask your health care provider or pharmacist if  you have questions. COMMON BRAND NAME(S): Paraplatin What should I tell my care team before I take this medication? They need to know if you have any of these conditions: Blood disorders Hearing problems Kidney disease Recent or ongoing radiation therapy An unusual or allergic reaction to carboplatin, cisplatin, other medications, foods, dyes, or preservatives Pregnant or trying to get pregnant Breast-feeding How should I use this medication? This medication is injected into a vein. It is given by your care team in a hospital or clinic setting. Talk to your care team about the use of this medication in children. Special care may be needed. Overdosage: If you think you have taken too much of this medicine contact a poison control center or emergency room at once. NOTE: This medicine is only for you. Do not share this medicine with others. What if I miss a dose? Keep appointments for follow-up doses. It is important not to miss your dose. Call your care team if you are unable to keep an appointment. What may interact with this medication? Medications for seizures Some  antibiotics, such as amikacin, gentamicin, neomycin, streptomycin, tobramycin Vaccines This list may not describe all possible interactions. Give your health care provider a list of all the medicines, herbs, non-prescription drugs, or dietary supplements you use. Also tell them if you smoke, drink alcohol, or use illegal drugs. Some items may interact with your medicine. What should I watch for while using this medication? Your condition will be monitored carefully while you are receiving this medication. You may need blood work while taking this medication. This medication may make you feel generally unwell. This is not uncommon, as chemotherapy can affect healthy cells as well as cancer cells. Report any side effects. Continue your course of treatment even though you feel ill unless your care team tells you to stop. In some cases, you may be given additional medications to help with side effects. Follow all directions for their use. This medication may increase your risk of getting an infection. Call your care team for advice if you get a fever, chills, sore throat, or other symptoms of a cold or flu. Do not treat yourself. Try to avoid being around people who are sick. Avoid taking medications that contain aspirin, acetaminophen, ibuprofen, naproxen, or ketoprofen unless instructed by your care team. These medications may hide a fever. Be careful brushing or flossing your teeth or using a toothpick because you may get an infection or bleed more easily. If you have any dental work done, tell your dentist you are receiving this medication. Talk to your care team if you wish to become pregnant or think you might be pregnant. This medication can cause serious birth defects. Talk to your care team about effective forms of contraception. Do not breast-feed while taking this medication. What side effects may I notice from receiving this medication? Side effects that you should report to your care team as  soon as possible: Allergic reactions--skin rash, itching, hives, swelling of the face, lips, tongue, or throat Infection--fever, chills, cough, sore throat, wounds that don't heal, pain or trouble when passing urine, general feeling of discomfort or being unwell Low red blood cell level--unusual weakness or fatigue, dizziness, headache, trouble breathing Pain, tingling, or numbness in the hands or feet, muscle weakness, change in vision, confusion or trouble speaking, loss of balance or coordination, trouble walking, seizures Unusual bruising or bleeding Side effects that usually do not require medical attention (report to your care team if they continue or are  bothersome): Hair loss Nausea Unusual weakness or fatigue Vomiting This list may not describe all possible side effects. Call your doctor for medical advice about side effects. You may report side effects to FDA at 1-800-FDA-1088. Where should I keep my medication? This medication is given in a hospital or clinic. It will not be stored at home. NOTE: This sheet is a summary. It may not cover all possible information. If you have questions about this medicine, talk to your doctor, pharmacist, or health care provider.  2024 Elsevier/Gold Standard (2022-01-11 00:00:00)

## 2023-10-23 ENCOUNTER — Other Ambulatory Visit: Payer: Self-pay

## 2023-10-23 ENCOUNTER — Inpatient Hospital Stay: Payer: PPO

## 2023-10-23 ENCOUNTER — Telehealth: Payer: Self-pay | Admitting: *Deleted

## 2023-10-23 VITALS — BP 125/72 | HR 69 | Temp 98.6°F | Resp 16

## 2023-10-23 DIAGNOSIS — Z171 Estrogen receptor negative status [ER-]: Secondary | ICD-10-CM

## 2023-10-23 DIAGNOSIS — Z5112 Encounter for antineoplastic immunotherapy: Secondary | ICD-10-CM | POA: Diagnosis not present

## 2023-10-23 MED ORDER — PEGFILGRASTIM-FPGK 6 MG/0.6ML ~~LOC~~ SOSY
6.0000 mg | PREFILLED_SYRINGE | Freq: Once | SUBCUTANEOUS | Status: AC
Start: 2023-10-23 — End: 2023-10-23
  Administered 2023-10-23: 6 mg via SUBCUTANEOUS
  Filled 2023-10-23: qty 0.6

## 2023-10-23 NOTE — Telephone Encounter (Signed)
-----   Message from Nurse Lanora Manis A sent at 10/20/2023  3:52 PM EST ----- Regarding: First time- Dr. Al Pimple 10/20/23- Patient tolerated the infusions well. Dr. Al Pimple

## 2023-10-23 NOTE — Patient Instructions (Signed)

## 2023-10-23 NOTE — Telephone Encounter (Signed)
Called & spoke to pt to see how she did with her recent treatment.  She came & got her injection today & took her claritin.  She knows her next appts & how to reach [place] if needed.  She reports no taste & we discussed pushing herself to eat & try different foods, maybe salty, sweet, or tart to see if anything works ut know that this is a side effect of the treatment.

## 2023-10-26 ENCOUNTER — Other Ambulatory Visit: Payer: Self-pay | Admitting: *Deleted

## 2023-10-26 DIAGNOSIS — C50212 Malignant neoplasm of upper-inner quadrant of left female breast: Secondary | ICD-10-CM

## 2023-10-27 ENCOUNTER — Encounter: Payer: Self-pay | Admitting: Hematology and Oncology

## 2023-10-27 ENCOUNTER — Inpatient Hospital Stay: Payer: PPO

## 2023-10-27 ENCOUNTER — Inpatient Hospital Stay (HOSPITAL_BASED_OUTPATIENT_CLINIC_OR_DEPARTMENT_OTHER): Payer: PPO | Admitting: Hematology and Oncology

## 2023-10-27 ENCOUNTER — Other Ambulatory Visit: Payer: Self-pay | Admitting: *Deleted

## 2023-10-27 VITALS — BP 137/70 | HR 87 | Temp 97.7°F | Resp 19

## 2023-10-27 VITALS — BP 146/72 | HR 91 | Temp 97.5°F | Resp 18 | Ht 65.0 in | Wt 191.1 lb

## 2023-10-27 DIAGNOSIS — K521 Toxic gastroenteritis and colitis: Secondary | ICD-10-CM

## 2023-10-27 DIAGNOSIS — R401 Stupor: Secondary | ICD-10-CM

## 2023-10-27 DIAGNOSIS — D72829 Elevated white blood cell count, unspecified: Secondary | ICD-10-CM

## 2023-10-27 DIAGNOSIS — C50212 Malignant neoplasm of upper-inner quadrant of left female breast: Secondary | ICD-10-CM

## 2023-10-27 DIAGNOSIS — Z171 Estrogen receptor negative status [ER-]: Secondary | ICD-10-CM

## 2023-10-27 DIAGNOSIS — E876 Hypokalemia: Secondary | ICD-10-CM

## 2023-10-27 DIAGNOSIS — T451X5A Adverse effect of antineoplastic and immunosuppressive drugs, initial encounter: Secondary | ICD-10-CM

## 2023-10-27 DIAGNOSIS — K1231 Oral mucositis (ulcerative) due to antineoplastic therapy: Secondary | ICD-10-CM | POA: Diagnosis not present

## 2023-10-27 DIAGNOSIS — S32010A Wedge compression fracture of first lumbar vertebra, initial encounter for closed fracture: Secondary | ICD-10-CM

## 2023-10-27 DIAGNOSIS — W19XXXA Unspecified fall, initial encounter: Secondary | ICD-10-CM

## 2023-10-27 DIAGNOSIS — R112 Nausea with vomiting, unspecified: Secondary | ICD-10-CM

## 2023-10-27 DIAGNOSIS — Z95828 Presence of other vascular implants and grafts: Secondary | ICD-10-CM

## 2023-10-27 DIAGNOSIS — M1612 Unilateral primary osteoarthritis, left hip: Secondary | ICD-10-CM

## 2023-10-27 DIAGNOSIS — Z5112 Encounter for antineoplastic immunotherapy: Secondary | ICD-10-CM | POA: Diagnosis not present

## 2023-10-27 DIAGNOSIS — N6091 Unspecified benign mammary dysplasia of right breast: Secondary | ICD-10-CM

## 2023-10-27 LAB — CMP (CANCER CENTER ONLY)
ALT: 30 U/L (ref 0–44)
AST: 14 U/L — ABNORMAL LOW (ref 15–41)
Albumin: 4.1 g/dL (ref 3.5–5.0)
Alkaline Phosphatase: 87 U/L (ref 38–126)
Anion gap: 9 (ref 5–15)
BUN: 11 mg/dL (ref 8–23)
CO2: 25 mmol/L (ref 22–32)
Calcium: 9.7 mg/dL (ref 8.9–10.3)
Chloride: 101 mmol/L (ref 98–111)
Creatinine: 0.94 mg/dL (ref 0.44–1.00)
GFR, Estimated: >60 mL/min (ref >60)
Glucose, Bld: 109 mg/dL — ABNORMAL HIGH (ref 70–99)
Potassium: 3.9 mmol/L (ref 3.5–5.1)
Sodium: 135 mmol/L (ref 135–145)
Total Bilirubin: 1.1 mg/dL (ref 0.0–1.2)
Total Protein: 6.6 g/dL (ref 6.5–8.1)

## 2023-10-27 LAB — CBC WITH DIFFERENTIAL (CANCER CENTER ONLY)
Abs Immature Granulocytes: 0.05 10*3/uL (ref 0.00–0.07)
Basophils Absolute: 0.1 10*3/uL (ref 0.0–0.1)
Basophils Relative: 1 %
Eosinophils Absolute: 0 10*3/uL (ref 0.0–0.5)
Eosinophils Relative: 1 %
HCT: 45.3 % (ref 36.0–46.0)
Hemoglobin: 15.3 g/dL — ABNORMAL HIGH (ref 12.0–15.0)
Immature Granulocytes: 1 %
Lymphocytes Relative: 22 %
Lymphs Abs: 1.1 10*3/uL (ref 0.7–4.0)
MCH: 31 pg (ref 26.0–34.0)
MCHC: 33.8 g/dL (ref 30.0–36.0)
MCV: 91.7 fL (ref 80.0–100.0)
Monocytes Absolute: 1.8 10*3/uL — ABNORMAL HIGH (ref 0.1–1.0)
Monocytes Relative: 35 %
Neutro Abs: 2.1 10*3/uL (ref 1.7–7.7)
Neutrophils Relative %: 40 %
Platelet Count: 178 10*3/uL (ref 150–400)
RBC: 4.94 MIL/uL (ref 3.87–5.11)
RDW: 12.8 % (ref 11.5–15.5)
WBC Count: 5.2 10*3/uL (ref 4.0–10.5)
nRBC: 0 % (ref 0.0–0.2)

## 2023-10-27 MED ORDER — SODIUM CHLORIDE 0.9 % IV SOLN
INTRAVENOUS | Status: AC
Start: 2023-10-27 — End: 2023-10-27

## 2023-10-27 MED ORDER — FAMOTIDINE 20 MG PO TABS
20.0000 mg | ORAL_TABLET | Freq: Two times a day (BID) | ORAL | 0 refills | Status: DC
Start: 2023-10-27 — End: 2023-12-06

## 2023-10-27 MED ORDER — LIDOCAINE VISCOUS HCL 2 % MT SOLN: 15.0000 mL | OROMUCOSAL | 0 refills | Status: DC | PRN

## 2023-10-27 MED ORDER — TRAMADOL HCL 50 MG PO TABS: 50.0000 mg | ORAL_TABLET | Freq: Two times a day (BID) | ORAL | 0 refills | Status: DC | PRN

## 2023-10-27 MED ORDER — HEPARIN SOD (PORK) LOCK FLUSH 100 UNIT/ML IV SOLN
500.0000 [IU] | Freq: Once | INTRAVENOUS | Status: AC
  Administered 2023-10-27: 500 [IU]

## 2023-10-27 MED ORDER — FAMOTIDINE IN NACL 20-0.9 MG/50ML-% IV SOLN
20.0000 mg | INTRAVENOUS | Status: DC | PRN
  Administered 2023-10-27: 20 mg via INTRAVENOUS
  Filled 2023-10-27: qty 50

## 2023-10-27 MED ORDER — SODIUM CHLORIDE 0.9% FLUSH
10.0000 mL | Freq: Once | INTRAVENOUS | Status: AC
  Administered 2023-10-27: 10 mL

## 2023-10-27 NOTE — Progress Notes (Signed)
[facility] CONSULT NOTE  Patient Care Team: Malka So., MD as PCP - General (Internal Medicine) [name], MD as Consulting Physician (General Surgery) Harold Hedge, MD as Consulting Physician (Obstetrics and Gynecology) Dorena Cookey, MD (Inactive) as Consulting Physician (Gastroenterology) Rachel Moulds, MD as Consulting Physician (Hematology and Oncology) Pershing Proud, RN as Oncology Nurse Navigator Donnelly Angelica, RN as Oncology Nurse Navigator  CHIEF COMPLAINTS/PURPOSE OF CONSULTATION:  Newly diagnosed breast cancer  HISTORY OF PRESENTING ILLNESS:   Rebecca Steele 73 y.o. female is here because of recent diagnosis of left breast cancer  I reviewed her records extensively and collaborated the history with the patient.  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)  09/22/2023 Mammogram   Patient self palpated a breast mass in her left breast around November 2019 went for diagnostic mammogram, this showed there is a developing density on mammogram, 2 of the lower left axillary lymph nodes exhibit increased cortical thickness, ultrasound confirmed a 2.7 cm irregular hypoechoic mass in left breast at 12:00 5 cm from the nipple along with abnormal left axillary lymph   09/29/2023 Pathology Results   Left breast central superior needle core biopsy 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 The symptoms started after recent chemotherapy and have resulted in an inability to eat since Tuesday. She has tried various foods and drinks, including apple juice, lemonade, apple sauce, chocolate pudding, and different types of soup, but found that anything acidic, like tomato soup, exacerbates the discomfort.  In addition to the mucositis,  She has also been experiencing diarrhea, requiring up to five doses of Imodium a day, but still having two to three bowel movements daily. She reports temp of 99.5 max and bone pain, specifically in the back, which started today. She also mentions a sensation of something stuck in her throat, for which she has been taking famotidine.  She has been experiencing nausea and vomiting. She also reports a loss of taste, which has further discouraged her from eating. She has been trying to consume nutritional drinks like Ensure or Boost, but the lack of taste has made this challenging.  The patient also reports some pain in her left breast, which she perceives as a positive sign of the cancer responding to treatment. She has been feeling weak and tired, sleeping a lot, and has been unable to maintain her usual activities.  Rest of the pertinent 10 point ROS reviewed and neg.  MEDICAL HISTORY:  Past Medical History:  Diagnosis Date   Breast mass, right  Complication of anesthesia    states she has had 2 neck surgeries and her neck is stiff, hard to wake   Depression    History of kidney stones    Hypertension    Hypothyroidism    Kidney stones    Mitral valve prolapse    Renal disease    Sleep  apnea    HAS MILD OSA, NO CPAP NEEDED    SURGICAL HISTORY: Past Surgical History:  Procedure Laterality Date   APPENDECTOMY     BREAST BIOPSY Right 09/16/2022   MM RT BREAST BX W LOC DEV 1ST LESION IMAGE BX SPEC STEREO GUIDE 09/16/2022 [facility]   BREAST BIOPSY  12/06/2022   MM RT RADIOACTIVE SEED LOC MAMMO GUIDE 12/06/2022 [facility]   BREAST EXCISIONAL BIOPSY Right 2018   CHOLECYSTECTOMY     CYSTOSCOPY W/ RETROGRADES     KYPHOPLASTY N/A 10/05/2021   Procedure: LUMBAR ONE KYPHOPLASTY;  Surgeon: Jadene Pierini, MD;  Location: [facility];  Service: Neurosurgery;  Laterality: N/A;   NECK SURGERY     PORTACATH PLACEMENT Right 10/12/2023   Procedure: INSERTION PORT-A-CATH WITH GUIDED ULTRASOUND;  Surgeon: [name], MD;  Location: [facility];  Service: General;  Laterality: Right;   RADIOACTIVE SEED GUIDED EXCISIONAL BREAST BIOPSY Right 12/12/2017   Procedure: RIGHT RADIOACTIVE SEED GUIDED EXCISIONAL BREAST BIOPSY ERAS PATHWAY;  Surgeon: [name], MD;  Location: [facility];  Service: General;  Laterality: Right;  LMA   RADIOACTIVE SEED GUIDED EXCISIONAL BREAST BIOPSY Right 12/07/2022   Procedure: RADIOACTIVE SEED GUIDED EXCISIONAL RIGHT BREAST BIOPSY;  Surgeon: [name], MD;  Location:  SURGERY CENTER;  Service: General;  Laterality: Right;   SHOULDER SURGERY     TONSILLECTOMY     TOTAL HIP ARTHROPLASTY Left 06/04/2021   Procedure: TOTAL HIP ARTHROPLASTY ANTERIOR APPROACH;  Surgeon: Jodi Geralds, MD;  Location: [facility];  Service: Orthopedics;  Laterality: Left;   TRIGGER FINGER RELEASE Right 05/18/2015   Procedure: RIGHT  LONG FINGER TRIGGER RELEASE ;  Surgeon: Mack Hook, MD;  Location:  SURGERY CENTER;  Service: Orthopedics;  Laterality: Right;    SOCIAL HISTORY: Social History   Socioeconomic History   Marital status: Married    Spouse name: Not on file   Number of children: Not on file    Years of education: Not on file   Highest education level: Not on file  Occupational History   Not on file  Tobacco Use   Smoking status: Never   Smokeless tobacco: Never  Vaping Use   Vaping status: Never Used  Substance and Sexual Activity   Alcohol use: No   Drug use: No   Sexual activity: Not on file  Other Topics Concern   Not on file  Social History Narrative   Not on file   Social Drivers of Health   Financial Resource Strain: Low Risk  (04/24/2023)   Received from [facility]   Overall Financial Resource Strain (CARDIA)    Difficulty of Paying Living Expenses: Not hard at all  Food Insecurity: No Food Insecurity (10/20/2023)   Hunger Vital Sign    Worried About Running Out of Food in the Last Year: Never true    Ran Out of Food in the Last Year: Never true  Transportation Needs: No Transportation Needs (10/20/2023)   PRAPARE - Administrator, Civil Service (Medical): No    Lack of Transportation (Non-Medical): No  Physical Activity: Inactive (04/24/2023)  Received from [facility]   Exercise Vital Sign    Days of Exercise per Week: 0 days    Minutes of Exercise per Session: 10 min  Stress: No Stress Concern Present (04/24/2023)   Received from [facility] of Occupational Health - Occupational Stress Questionnaire    Feeling of Stress : Not at all  Social Connections: Socially Integrated (04/24/2023)   Received from [facility]   Social Network    How would you rate your social network (family, work, friends)?: Good participation with social networks  Intimate Partner Violence: Not At Risk (04/24/2023)   Received from [facility]   HITS    Over the last 12 months how often did your partner physically hurt you?: Never    Over the last 12 months how often did your partner insult you or talk down to you?: Never    Over the last 12 months how often did your partner threaten you with physical harm?: Never    Over the last 12  months how often did your partner scream or curse at you?: Never    FAMILY HISTORY: Family History  Problem Relation Age of Onset   Breast cancer Maternal Aunt        51s    ALLERGIES:  is allergic to gadolinium derivatives, other, valium, paxlovid [nirmatrelvir-ritonavir], doxycycline, fentanyl, iohexol, macrodantin, red dye #40 (allura red), rocephin [ceftriaxone], and zithromax [azithromycin].  MEDICATIONS:  Current Outpatient Medications  Medication Sig Dispense Refill   famotidine (PEPCID) 20 MG tablet Take 1 tablet (20 mg total) by mouth 2 (two) times daily. 60 tablet 0   lidocaine (XYLOCAINE) 2 % solution Use as directed 15 mLs in the mouth or throat as needed for mouth pain. 480 mL 0   traMADol (ULTRAM) 50 MG tablet Take 1 tablet (50 mg total) by mouth every 12 (twelve) hours as needed. 30 tablet 0   butalbital-acetaminophen-caffeine (FIORICET) 50-325-40 MG tablet Take 1 tablet by mouth 2 (two) times daily as needed for headache or migraine. (Patient not taking: Reported on 10/18/2023)     cholecalciferol (VITAMIN D3) 25 MCG (1000 UNIT) tablet Take 1,000 Units by mouth daily. (Patient not taking: Reported on 10/18/2023)     dexamethasone (DECADRON) 4 MG tablet Take 2 tabs by mouth 2 times daily starting day before chemo. Then take 2 tabs daily for 2 days starting day after chemo. Take with food. (Patient not taking: Reported on 10/18/2023) 30 tablet 1   Evolocumab (REPATHA [place]) Inject into the skin.     levothyroxine (SYNTHROID) 25 MCG tablet Take 25 mcg by mouth daily before breakfast.     lidocaine-prilocaine (EMLA) cream Apply to affected area once (Patient not taking: Reported on 10/18/2023) 30 g 3   loratadine (CLARITIN) 10 MG tablet Take 10 mg by mouth daily.     montelukast (SINGULAIR) 10 MG tablet Take 10 mg by mouth at bedtime.     ondansetron (ZOFRAN) 8 MG tablet Take 1 tablet (8 mg total) by mouth every 8 (eight) hours as needed for nausea or vomiting. Start on the third day  after chemotherapy. (Patient not taking: Reported on 10/18/2023) 30 tablet 1   prochlorperazine (COMPAZINE) 10 MG tablet Take 1 tablet (10 mg total) by mouth every 6 (six) hours as needed for nausea or vomiting. (Patient not taking: Reported on 10/18/2023) 30 tablet 1   sertraline (ZOLOFT) 50 MG tablet Take 50 mg by mouth daily.     traZODone (DESYREL) 100 MG  tablet Take 100 mg by mouth at bedtime.     No current facility-administered medications for this visit.   Facility-Administered Medications Ordered in Other Visits  Medication Dose Route Frequency Provider Last Rate Last Admin   0.9 %  sodium chloride infusion   Intravenous Continuous Ninel Abdella, MD 500 mL/hr at 10/27/23 1333 New Bag at 10/27/23 1333   famotidine (PEPCID) IVPB 20 mg premix  20 mg Intravenous PRN Rachel Moulds, MD 200 mL/hr at 10/27/23 1336 20 mg at 10/27/23 1336    REVIEW OF SYSTEMS:   Constitutional: Denies fevers, chills or abnormal night sweats Eyes: Denies blurriness of vision, double vision or watery eyes Ears, nose, mouth, throat, and face: Denies mucositis or sore throat Respiratory: Denies cough, dyspnea or wheezes Cardiovascular: Denies palpitation, chest discomfort or lower extremity swelling Gastrointestinal:  Denies nausea, heartburn or change in bowel habits Skin: Denies abnormal skin rashes Lymphatics: Denies new lymphadenopathy or easy bruising Neurological:Denies numbness, tingling or new weaknesses Behavioral/Psych: Mood is stable, no new changes  Breast: As mentioned above All other systems were reviewed with the patient and are negative.  PHYSICAL EXAMINATION: ECOG PERFORMANCE STATUS: 0 - Asymptomatic  Vitals:   10/27/23 1247  BP: (!) 146/72  Pulse: 91  Resp: 18  Temp: (!) 97.5 F (36.4 C)  SpO2: 97%    Filed Weights   10/27/23 1247  Weight: 191 lb 1.6 oz (86.7 kg)     GENERAL appears tired, no acute pain or resp distress. Dry mouth, fissure noted in the tongue,  mucositis Port site appears well CTA bilaterally NO LE edema.   LABORATORY DATA:  I have reviewed the data as listed Lab Results  Component Value Date   WBC 5.2 10/27/2023   HGB 15.3 (H) 10/27/2023   HCT 45.3 10/27/2023   MCV 91.7 10/27/2023   PLT 178 10/27/2023   Lab Results  Component Value Date   NA 135 10/27/2023   K 3.9 10/27/2023   CL 101 10/27/2023   CO2 25 10/27/2023    RADIOGRAPHIC STUDIES: I have personally reviewed the radiological reports and agreed with the findings in the report.  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  Chemotherapy-induced Mucositis Severe oral mucositis with dysphagia and anorexia. Discussed the need for healing and the potential for dose reduction in future chemotherapy cycles. -Prescribe magic mouthwash for symptomatic relief. -Advise on dietary modifications (avoid acidic foods) -Plan to reduce chemotherapy dose in future cycles.  Chemotherapy-induced Diarrhea Persistent diarrhea despite Imodium use. -Continue Imodium as needed. -Monitor for dehydration and electrolyte imbalances. -IVF today and repeat on Monday and wednesday  Chemotherapy-induced Bone Pain New onset of bone pain likely secondary to chemotherapy. -Prescribe Tramadol for pain management..  Gastroesophageal Reflux Disease Complaints of sensation of something stuck in the throat. -Prescribe Famotidine 20mg  and administer IV Pepcid today.  Follow-up Plan to reassess patient's tolerance to chemotherapy and potential need for regimen modification next week.    All questions were answered. The patient knows to call the clinic with any problems, questions or  concerns.    Rachel Moulds, MD 10/27/23

## 2023-10-27 NOTE — Assessment & Plan Note (Signed)
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  Chemotherapy-induced Mucositis Severe oral mucositis with dysphagia and anorexia. Discussed the need for healing and the potential for dose reduction in future chemotherapy cycles. -Prescribe magic mouthwash for symptomatic relief. -Advise on dietary modifications (avoid acidic foods) -Plan to reduce chemotherapy dose in future cycles.  Chemotherapy-induced Diarrhea Persistent diarrhea despite Imodium use. -Continue Imodium as needed. -Monitor for dehydration and electrolyte imbalances. -IVF today and repeat on Monday and wednesday  Chemotherapy-induced Bone Pain New onset of bone pain likely secondary to chemotherapy. -Prescribe Tramadol for pain management..  Gastroesophageal Reflux Disease Complaints of sensation of something stuck in the throat. -Prescribe Famotidine 20mg  and administer IV Pepcid today.  Follow-up Plan to reassess patient's tolerance to chemotherapy and potential need for regimen modification next week.

## 2023-10-30 ENCOUNTER — Inpatient Hospital Stay: Payer: PPO

## 2023-10-30 VITALS — BP 134/67 | HR 81 | Temp 98.3°F | Resp 18

## 2023-10-30 DIAGNOSIS — M1612 Unilateral primary osteoarthritis, left hip: Secondary | ICD-10-CM

## 2023-10-30 DIAGNOSIS — R401 Stupor: Secondary | ICD-10-CM

## 2023-10-30 DIAGNOSIS — N6091 Unspecified benign mammary dysplasia of right breast: Secondary | ICD-10-CM

## 2023-10-30 DIAGNOSIS — Z5112 Encounter for antineoplastic immunotherapy: Secondary | ICD-10-CM | POA: Diagnosis not present

## 2023-10-30 DIAGNOSIS — E876 Hypokalemia: Secondary | ICD-10-CM

## 2023-10-30 DIAGNOSIS — W19XXXA Unspecified fall, initial encounter: Secondary | ICD-10-CM

## 2023-10-30 DIAGNOSIS — D72829 Elevated white blood cell count, unspecified: Secondary | ICD-10-CM

## 2023-10-30 DIAGNOSIS — Z95828 Presence of other vascular implants and grafts: Secondary | ICD-10-CM

## 2023-10-30 DIAGNOSIS — S32010A Wedge compression fracture of first lumbar vertebra, initial encounter for closed fracture: Secondary | ICD-10-CM

## 2023-10-30 MED ORDER — FAMOTIDINE IN NACL 20-0.9 MG/50ML-% IV SOLN
20.0000 mg | Freq: Once | INTRAVENOUS | Status: AC
  Administered 2023-10-30: 20 mg via INTRAVENOUS
  Filled 2023-10-30: qty 50

## 2023-10-30 MED ORDER — SODIUM CHLORIDE 0.9% FLUSH
10.0000 mL | Freq: Once | INTRAVENOUS | Status: AC
  Administered 2023-10-30: 10 mL

## 2023-10-30 MED ORDER — HEPARIN SOD (PORK) LOCK FLUSH 100 UNIT/ML IV SOLN
500.0000 [IU] | Freq: Once | INTRAVENOUS | Status: AC
  Administered 2023-10-30: 500 [IU]

## 2023-10-30 MED ORDER — SODIUM CHLORIDE 0.9 % IV SOLN
INTRAVENOUS | Status: AC
Start: 2023-10-30 — End: 2023-10-30

## 2023-10-30 NOTE — Progress Notes (Signed)
Patient presents today for Normal saline IV infusion.  Patient reports diarrhea and fatigue since last treatment. Per patient low grade temp of 99.7 x1 day but none since. Denies any vomiting but reports loss of taste and decreased appetite since treatment.  Vital signs are stable. Patient to get Pepcid infusion. We will proceed with treatment per MD orders.    Patient tolerated IVF and Pepcid well with no complaints voiced.  Patient left ambulatory in stable condition.  Vital signs stable at discharge.  Follow up as scheduled.

## 2023-10-30 NOTE — Patient Instructions (Signed)
[facility] - A DEPT OF [facility]  Discharge Instructions: Thank you for choosing [facility] to provide your oncology and hematology care.   If you have a lab appointment with the Cancer Center, please go directly to the Cancer Center and check in at the registration area.   Wear comfortable clothing and clothing appropriate for easy access to any Portacath or PICC line.   We strive to give you quality time with your provider. You may need to reschedule your appointment if you arrive late (15 or more minutes).  Arriving late affects you and other patients whose appointments are after yours.  Also, if you miss three or more appointments without notifying the office, you may be dismissed from the clinic at the provider's discretion.      For prescription refill requests, have your pharmacy contact our office and allow 72 hours for refills to be completed.    Today you received the following Normal Saline infusion and Pepcid.   Famotidine Injection What is this medication? FAMOTIDINE (fa MOE ti deen) treats stomach ulcers, reflux disease, or other conditions that cause too much stomach acid. It works by reducing the amount of acid in the stomach. This medicine may be used for other purposes; ask your health care provider or pharmacist if you have questions. COMMON BRAND NAME(S): Pepcid What should I tell my care team before I take this medication? They need to know if you have any of these conditions: Kidney or liver disease An unusual or allergic reaction to famotidine, other medications, foods, dyes, or preservatives Pregnant or trying to get pregnant Breast-feeding How should I use this medication? This medication is for infusion into a vein. It is given in a hospital or clinic setting. Talk to your care team regarding the use of this medication in children. Special care may be needed. Overdosage: If you think you have taken too much of  this medicine contact a poison control center or emergency room at once. NOTE: This medicine is only for you. Do not share this medicine with others. What if I miss a dose? This does not apply. What may interact with this medication? Delavirdine Itraconazole Ketoconazole This list may not describe all possible interactions. Give your health care provider a list of all the medicines, herbs, non-prescription drugs, or dietary supplements you use. Also tell them if you smoke, drink alcohol, or use illegal drugs. Some items may interact with your medicine. What should I watch for while using this medication? Visit your care team for regular checks on your progress. Tell your care team if your symptoms do not start to get better or if they get worse. Avoid taking medications that contain aspirin, acetaminophen, ibuprofen, naproxen, or ketoprofen unless instructed by your care team. These can make your condition worse. Tobacco and alcohol may irritate your stomach. This may increase the time it takes for ulcers to heal. If you get black, tarry stools or vomit up what looks like coffee grounds, call your doctor or care team at once. You may have a bleeding ulcer. This medication may cause a decrease in vitamin B12. You should make sure that you get enough vitamin B12 while you are taking this medication. Discuss the foods you eat and the vitamins you take with your care team. What side effects may I notice from receiving this medication? Side effects that you should report to your care team as soon as possible: Allergic reactions--skin rash,  itching, hives, swelling of the face, lips, tongue, or throat Confusion Hallucinations Side effects that usually do not require medical attention (report to your care team if they continue or are bothersome): Constipation Diarrhea Dizziness Headache This list may not describe all possible side effects. Call your doctor for medical advice about side effects. You  may report side effects to FDA at 1-800-FDA-1088. Where should I keep my medication? This medication is given in a hospital or clinic. You will not be given this medication to store at home. NOTE: This sheet is a summary. It may not cover all possible information. If you have questions about this medicine, talk to your doctor, pharmacist, or health care provider.  2024 Elsevier/Gold Standard (2023-02-09 00:00:00)   To help prevent nausea and vomiting after your treatment, we encourage you to take your nausea medication as directed.  BELOW ARE SYMPTOMS THAT SHOULD BE REPORTED IMMEDIATELY: *FEVER GREATER THAN 100.4 F (38 C) OR HIGHER *CHILLS OR SWEATING *NAUSEA AND VOMITING THAT IS NOT CONTROLLED WITH YOUR NAUSEA MEDICATION *UNUSUAL SHORTNESS OF BREATH *UNUSUAL BRUISING OR BLEEDING *URINARY PROBLEMS (pain or burning when urinating, or frequent urination) *BOWEL PROBLEMS (unusual diarrhea, constipation, pain near the anus) TENDERNESS IN MOUTH AND THROAT WITH OR WITHOUT PRESENCE OF ULCERS (sore throat, sores in mouth, or a toothache) UNUSUAL RASH, SWELLING OR PAIN  UNUSUAL VAGINAL DISCHARGE OR ITCHING   Items with * indicate a potential emergency and should be followed up as soon as possible or go to the Emergency Department if any problems should occur.  Please show the CHEMOTHERAPY ALERT CARD or IMMUNOTHERAPY ALERT CARD at check-in to the Emergency Department and triage nurse.  Should you have questions after your visit or need to cancel or reschedule your appointment, please contact [facility] - A DEPT OF [facility] [place]  Dept: [phone]  and follow the prompts.  Office hours are 8:00 a.m. to 4:30 p.m. Monday - Friday. Please note that voicemails left after 4:00 p.m. may not be returned until the following business day.  We are closed weekends and major holidays. You have access to a nurse at all times for urgent questions. Please call the main number to the  clinic Dept: [phone] and follow the prompts.   For any non-urgent questions, you may also contact your provider using MyChart. We now offer e-Visits for anyone 31 and older to request care online for non-urgent symptoms. For details visit [email or web address].   Also download the MyChart app! Go to the app store, search "MyChart", open the app, select [place], and log in with your MyChart username and password.

## 2023-10-31 ENCOUNTER — Inpatient Hospital Stay: Payer: PPO

## 2023-10-31 VITALS — BP 149/71 | HR 84 | Resp 16

## 2023-10-31 DIAGNOSIS — Z95828 Presence of other vascular implants and grafts: Secondary | ICD-10-CM

## 2023-10-31 DIAGNOSIS — Z5112 Encounter for antineoplastic immunotherapy: Secondary | ICD-10-CM | POA: Diagnosis not present

## 2023-10-31 DIAGNOSIS — S32010A Wedge compression fracture of first lumbar vertebra, initial encounter for closed fracture: Secondary | ICD-10-CM

## 2023-10-31 DIAGNOSIS — W19XXXA Unspecified fall, initial encounter: Secondary | ICD-10-CM

## 2023-10-31 DIAGNOSIS — R401 Stupor: Secondary | ICD-10-CM

## 2023-10-31 DIAGNOSIS — E876 Hypokalemia: Secondary | ICD-10-CM

## 2023-10-31 DIAGNOSIS — M1612 Unilateral primary osteoarthritis, left hip: Secondary | ICD-10-CM

## 2023-10-31 DIAGNOSIS — N6091 Unspecified benign mammary dysplasia of right breast: Secondary | ICD-10-CM

## 2023-10-31 DIAGNOSIS — D72829 Elevated white blood cell count, unspecified: Secondary | ICD-10-CM

## 2023-10-31 MED ORDER — SODIUM CHLORIDE 0.9 % IV SOLN
INTRAVENOUS | Status: AC
Start: 2023-10-31 — End: 2023-10-31

## 2023-10-31 MED ORDER — FAMOTIDINE IN NACL 20-0.9 MG/50ML-% IV SOLN
20.0000 mg | INTRAVENOUS | Status: DC | PRN
  Administered 2023-10-31: 20 mg via INTRAVENOUS
  Filled 2023-10-31: qty 50

## 2023-10-31 NOTE — Patient Instructions (Signed)
[facility] - A DEPT OF [facility]  Discharge Instructions: Thank you for choosing [facility] to provide your oncology and hematology care.   If you have a lab appointment with the Cancer Center, please go directly to the Cancer Center and check in at the registration area.   Wear comfortable clothing and clothing appropriate for easy access to any Portacath or PICC line.   We strive to give you quality time with your provider. You may need to reschedule your appointment if you arrive late (15 or more minutes).  Arriving late affects you and other patients whose appointments are after yours.  Also, if you miss three or more appointments without notifying the office, you may be dismissed from the clinic at the provider's discretion.      For prescription refill requests, have your pharmacy contact our office and allow 72 hours for refills to be completed.    Today you received the following Normal Saline infusion and Pepcid.   Famotidine Injection What is this medication? FAMOTIDINE (fa MOE ti deen) treats stomach ulcers, reflux disease, or other conditions that cause too much stomach acid. It works by reducing the amount of acid in the stomach. This medicine may be used for other purposes; ask your health care provider or pharmacist if you have questions. COMMON BRAND NAME(S): Pepcid What should I tell my care team before I take this medication? They need to know if you have any of these conditions: Kidney or liver disease An unusual or allergic reaction to famotidine, other medications, foods, dyes, or preservatives Pregnant or trying to get pregnant Breast-feeding How should I use this medication? This medication is for infusion into a vein. It is given in a hospital or clinic setting. Talk to your care team regarding the use of this medication in children. Special care may be needed. Overdosage: If you think you have taken too much of  this medicine contact a poison control center or emergency room at once. NOTE: This medicine is only for you. Do not share this medicine with others. What if I miss a dose? This does not apply. What may interact with this medication? Delavirdine Itraconazole Ketoconazole This list may not describe all possible interactions. Give your health care provider a list of all the medicines, herbs, non-prescription drugs, or dietary supplements you use. Also tell them if you smoke, drink alcohol, or use illegal drugs. Some items may interact with your medicine. What should I watch for while using this medication? Visit your care team for regular checks on your progress. Tell your care team if your symptoms do not start to get better or if they get worse. Avoid taking medications that contain aspirin, acetaminophen, ibuprofen, naproxen, or ketoprofen unless instructed by your care team. These can make your condition worse. Tobacco and alcohol may irritate your stomach. This may increase the time it takes for ulcers to heal. If you get black, tarry stools or vomit up what looks like coffee grounds, call your doctor or care team at once. You may have a bleeding ulcer. This medication may cause a decrease in vitamin B12. You should make sure that you get enough vitamin B12 while you are taking this medication. Discuss the foods you eat and the vitamins you take with your care team. What side effects may I notice from receiving this medication? Side effects that you should report to your care team as soon as possible: Allergic reactions--skin rash,  itching, hives, swelling of the face, lips, tongue, or throat Confusion Hallucinations Side effects that usually do not require medical attention (report to your care team if they continue or are bothersome): Constipation Diarrhea Dizziness Headache This list may not describe all possible side effects. Call your doctor for medical advice about side effects. You  may report side effects to FDA at 1-800-FDA-1088. Where should I keep my medication? This medication is given in a hospital or clinic. You will not be given this medication to store at home. NOTE: This sheet is a summary. It may not cover all possible information. If you have questions about this medicine, talk to your doctor, pharmacist, or health care provider.  2024 Elsevier/Gold Standard (2023-02-09 00:00:00)   To help prevent nausea and vomiting after your treatment, we encourage you to take your nausea medication as directed.  BELOW ARE SYMPTOMS THAT SHOULD BE REPORTED IMMEDIATELY: *FEVER GREATER THAN 100.4 F (38 C) OR HIGHER *CHILLS OR SWEATING *NAUSEA AND VOMITING THAT IS NOT CONTROLLED WITH YOUR NAUSEA MEDICATION *UNUSUAL SHORTNESS OF BREATH *UNUSUAL BRUISING OR BLEEDING *URINARY PROBLEMS (pain or burning when urinating, or frequent urination) *BOWEL PROBLEMS (unusual diarrhea, constipation, pain near the anus) TENDERNESS IN MOUTH AND THROAT WITH OR WITHOUT PRESENCE OF ULCERS (sore throat, sores in mouth, or a toothache) UNUSUAL RASH, SWELLING OR PAIN  UNUSUAL VAGINAL DISCHARGE OR ITCHING   Items with * indicate a potential emergency and should be followed up as soon as possible or go to the Emergency Department if any problems should occur.  Please show the CHEMOTHERAPY ALERT CARD or IMMUNOTHERAPY ALERT CARD at check-in to the Emergency Department and triage nurse.  Should you have questions after your visit or need to cancel or reschedule your appointment, please contact [facility] - A DEPT OF [facility] [place]  Dept: [phone]  and follow the prompts.  Office hours are 8:00 a.m. to 4:30 p.m. Monday - Friday. Please note that voicemails left after 4:00 p.m. may not be returned until the following business day.  We are closed weekends and major holidays. You have access to a nurse at all times for urgent questions. Please call the main number to the  clinic Dept: [phone] and follow the prompts.   For any non-urgent questions, you may also contact your provider using MyChart. We now offer e-Visits for anyone 31 and older to request care online for non-urgent symptoms. For details visit [email or web address].   Also download the MyChart app! Go to the app store, search "MyChart", open the app, select [place], and log in with your MyChart username and password.

## 2023-11-01 ENCOUNTER — Other Ambulatory Visit: Payer: Self-pay | Admitting: *Deleted

## 2023-11-01 DIAGNOSIS — Z171 Estrogen receptor negative status [ER-]: Secondary | ICD-10-CM

## 2023-11-02 ENCOUNTER — Inpatient Hospital Stay: Payer: PPO | Admitting: Hematology and Oncology

## 2023-11-02 ENCOUNTER — Inpatient Hospital Stay: Payer: PPO

## 2023-11-02 VITALS — BP 122/86 | HR 78 | Temp 98.0°F | Resp 16 | Wt 189.6 lb

## 2023-11-02 DIAGNOSIS — Z5112 Encounter for antineoplastic immunotherapy: Secondary | ICD-10-CM | POA: Diagnosis not present

## 2023-11-02 DIAGNOSIS — C50212 Malignant neoplasm of upper-inner quadrant of left female breast: Secondary | ICD-10-CM

## 2023-11-02 DIAGNOSIS — Z95828 Presence of other vascular implants and grafts: Secondary | ICD-10-CM

## 2023-11-02 DIAGNOSIS — Z171 Estrogen receptor negative status [ER-]: Secondary | ICD-10-CM

## 2023-11-02 LAB — CMP (CANCER CENTER ONLY)
ALT: 26 U/L (ref 0–44)
AST: 18 U/L (ref 15–41)
Albumin: 3.9 g/dL (ref 3.5–5.0)
Alkaline Phosphatase: 96 U/L (ref 38–126)
Anion gap: 6 (ref 5–15)
BUN: 6 mg/dL — ABNORMAL LOW (ref 8–23)
CO2: 25 mmol/L (ref 22–32)
Calcium: 8.9 mg/dL (ref 8.9–10.3)
Chloride: 110 mmol/L (ref 98–111)
Creatinine: 0.94 mg/dL (ref 0.44–1.00)
GFR, Estimated: >60 mL/min (ref >60)
Glucose, Bld: 87 mg/dL (ref 70–99)
Potassium: 3.8 mmol/L (ref 3.5–5.1)
Sodium: 141 mmol/L (ref 135–145)
Total Bilirubin: 0.5 mg/dL (ref 0.0–1.2)
Total Protein: 5.8 g/dL — ABNORMAL LOW (ref 6.5–8.1)

## 2023-11-02 LAB — CBC WITH DIFFERENTIAL (CANCER CENTER ONLY)
Abs Immature Granulocytes: 0.4 10*3/uL — ABNORMAL HIGH (ref 0.00–0.07)
Basophils Absolute: 0.1 10*3/uL (ref 0.0–0.1)
Basophils Relative: 1 %
Eosinophils Absolute: 0 10*3/uL (ref 0.0–0.5)
Eosinophils Relative: 0 %
HCT: 41.5 % (ref 36.0–46.0)
Hemoglobin: 14 g/dL (ref 12.0–15.0)
Immature Granulocytes: 4 %
Lymphocytes Relative: 15 %
Lymphs Abs: 1.4 10*3/uL (ref 0.7–4.0)
MCH: 31.2 pg (ref 26.0–34.0)
MCHC: 33.7 g/dL (ref 30.0–36.0)
MCV: 92.4 fL (ref 80.0–100.0)
Monocytes Absolute: 0.6 10*3/uL (ref 0.1–1.0)
Monocytes Relative: 7 %
Neutro Abs: 7 10*3/uL (ref 1.7–7.7)
Neutrophils Relative %: 73 %
Platelet Count: 127 10*3/uL — ABNORMAL LOW (ref 150–400)
RBC: 4.49 MIL/uL (ref 3.87–5.11)
RDW: 13.9 % (ref 11.5–15.5)
WBC Count: 9.5 10*3/uL (ref 4.0–10.5)
nRBC: 0 % (ref 0.0–0.2)

## 2023-11-02 MED ORDER — HEPARIN SOD (PORK) LOCK FLUSH 100 UNIT/ML IV SOLN
500.0000 [IU] | Freq: Once | INTRAVENOUS | Status: AC
  Administered 2023-11-02: 500 [IU]

## 2023-11-02 MED ORDER — DOXYCYCLINE HYCLATE 100 MG PO TABS: 100.0000 mg | ORAL_TABLET | Freq: Two times a day (BID) | ORAL | 0 refills | Status: DC

## 2023-11-02 MED ORDER — MUPIROCIN 2 % EX OINT: 1.0000 | TOPICAL_OINTMENT | Freq: Two times a day (BID) | CUTANEOUS | 0 refills | Status: DC

## 2023-11-02 MED ORDER — CHOLESTYRAMINE 4 G PO PACK: 4.0000 g | PACK | Freq: Three times a day (TID) | ORAL | 0 refills | Status: DC

## 2023-11-02 MED ORDER — SODIUM CHLORIDE 0.9% FLUSH
10.0000 mL | Freq: Once | INTRAVENOUS | Status: AC
  Administered 2023-11-02: 10 mL

## 2023-11-02 NOTE — Progress Notes (Signed)
[facility] CONSULT NOTE  Patient Care Team: Malka So., MD as PCP - General (Internal Medicine) [name], MD as Consulting Physician (General Surgery) Harold Hedge, MD as Consulting Physician (Obstetrics and Gynecology) Dorena Cookey, MD (Inactive) as Consulting Physician (Gastroenterology) Rachel Moulds, MD as Consulting Physician (Hematology and Oncology) Pershing Proud, RN as Oncology Nurse Navigator Donnelly Angelica, RN as Oncology Nurse Navigator  CHIEF COMPLAINTS/PURPOSE OF CONSULTATION:  Newly diagnosed breast cancer  HISTORY OF PRESENTING ILLNESS:   Rebecca Steele 73 y.o. female is here because of recent diagnosis of left breast cancer  I reviewed her records extensively and collaborated the history with the patient.  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)  09/22/2023 Mammogram   Patient self palpated a breast mass in her left breast around November 2019 went for diagnostic mammogram, this showed there is a developing density on mammogram, 2 of the lower left axillary lymph nodes exhibit increased cortical thickness, ultrasound confirmed a 2.7 cm irregular hypoechoic mass in left breast at 12:00 5 cm from the nipple along with abnormal left axillary lymph   09/29/2023 Pathology Results   Left breast central superior needle core biopsy 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  History of Present Illness    The patient, with breast cancer, presents with mucositis and poor oral intake. She is accompanied by a family member.  She experiences mucositis with rawness in her mouth and nasal cavity, leading to bleeding and burning sensations. This has significantly decreased her ability to eat, with intake limited to a piece of ham, a tablespoon of mashed potatoes, and soft foods like Jell-O, pudding, and applesauce over the past week. She describes a sensation of 'sawdust' in her mouth, making swallowing difficult. Attempts to consume milkshakes have been largely unsuccessful, with only a small amount consumed. Additionally, she reports large clots of blood when blowing her nose and describes blisters inside her nose, along with an acne-like rash on her face that itches and burns.  She mentions breast mass feels different now, describing it as softer and not as firm as before. She has been experiencing diarrhea, for which Imodium has been ineffective. She is allergic to red dye, which limits her options for certain medications. She reports a history of sinus issues, with recent green nasal discharge, suggesting a possible sinus infection. She is allergic to multiple antibiotics but can tolerate doxycycline.  She has experienced significant fatigue, impacting her ability to engage in activities. Despite this, she continues to participate in her children's choir and handbell choir practices, although it requires considerable  effort.  In 2016 and 2017, she and her family experienced the loss of three parents, which has been a source of emotional distress.  She is  hoping to move forward with surgery and not consider any further chemotherapy at this time.  She does not believe she has a good quality of life and she understands the benefits of chemotherapy but she does not believe this is appropriate for her.  Rest of the pertinent 10 point ROS reviewed and neg.  MEDICAL HISTORY:  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    SURGICAL HISTORY: Past Surgical History:  Procedure Laterality Date   APPENDECTOMY     BREAST BIOPSY Right 09/16/2022   MM RT BREAST BX W LOC DEV 1ST LESION IMAGE BX SPEC STEREO GUIDE 09/16/2022 [facility]   BREAST BIOPSY  12/06/2022   MM RT RADIOACTIVE SEED LOC MAMMO GUIDE 12/06/2022 [facility]   BREAST EXCISIONAL BIOPSY Right 2018   CHOLECYSTECTOMY     CYSTOSCOPY W/ RETROGRADES     KYPHOPLASTY N/A 10/05/2021   Procedure: LUMBAR ONE KYPHOPLASTY;  Surgeon: Jadene Pierini, MD;  Location: [facility];  Service: Neurosurgery;  Laterality: N/A;   NECK SURGERY     PORTACATH PLACEMENT Right 10/12/2023   Procedure: INSERTION PORT-A-CATH WITH GUIDED ULTRASOUND;  Surgeon: [name], MD;  Location: [facility];  Service: General;  Laterality: Right;   RADIOACTIVE SEED GUIDED EXCISIONAL BREAST BIOPSY Right 12/12/2017   Procedure: RIGHT RADIOACTIVE SEED GUIDED EXCISIONAL BREAST BIOPSY ERAS PATHWAY;  Surgeon: [name], MD;  Location: [facility];  Service: General;  Laterality: Right;  LMA   RADIOACTIVE SEED GUIDED EXCISIONAL BREAST BIOPSY Right 12/07/2022   Procedure: RADIOACTIVE SEED GUIDED EXCISIONAL RIGHT BREAST BIOPSY;  Surgeon: [name], MD;  Location: [facility];  Service: General;  Laterality: Right;   SHOULDER SURGERY     TONSILLECTOMY     TOTAL HIP ARTHROPLASTY Left 06/04/2021   Procedure: TOTAL HIP ARTHROPLASTY ANTERIOR APPROACH;  Surgeon: Jodi Geralds, MD;  Location: [facility];  Service: Orthopedics;  Laterality: Left;   TRIGGER FINGER RELEASE Right 05/18/2015   Procedure: RIGHT  LONG FINGER TRIGGER RELEASE ;  Surgeon: Mack Hook, MD;  Location: [facility];  Service: Orthopedics;  Laterality: Right;    SOCIAL HISTORY: Social History   Socioeconomic History   Marital status: Married    Spouse name: Not on file   Number of children: Not on file   Years of education: Not on file   Highest education level: Not on file  Occupational History   Not on file  Tobacco Use   Smoking status: Never   Smokeless tobacco: Never  Vaping Use   Vaping status: Never Used  Substance and Sexual Activity   Alcohol use: No   Drug use: No   Sexual activity: Not on file  Other Topics Concern   Not on file  Social History Narrative   Not on file   Social Drivers of Health   Financial Resource Strain: Low Risk  (04/24/2023)  Received from [facility]   Overall Financial Resource Strain (CARDIA)    Difficulty of Paying Living Expenses: Not hard at all  Food Insecurity: No Food Insecurity (10/20/2023)   Hunger Vital Sign    Worried About Running Out of Food in the Last Year: Never true    Ran Out of Food in the Last Year: Never true  Transportation Needs: No Transportation Needs (10/20/2023)   PRAPARE - Administrator, Civil Service (Medical): No    Lack of Transportation (Non-Medical): No  Physical Activity: Inactive (04/24/2023)   [facility]   Exercise Vital Sign    Days of Exercise per Week: 0 days    Minutes of Exercise per Session: 10 min  Stress: No Stress Concern Present (04/24/2023)   Received from [facility] - Occupational Stress  Questionnaire    Feeling of Stress : Not at all  Social Connections: Socially Integrated (04/24/2023)   [facility]   Social Network    How would you rate your social network (family, work, friends)?: Good participation with social networks  Intimate Partner Violence: Not At Risk (04/24/2023)   Received from [facility]   HITS    Over the last 12 months how often did your partner physically hurt you?: Never    Over the last 12 months how often did your partner insult you or talk down to you?: Never    Over the last 12 months how often did your partner threaten you with physical harm?: Never    Over the last 12 months how often did your partner scream or curse at you?: Never    FAMILY HISTORY: Family History  Problem Relation Age of Onset   Breast cancer Maternal Aunt        35s    ALLERGIES:  is allergic to gadolinium derivatives, other, valium, paxlovid [nirmatrelvir-ritonavir], doxycycline, fentanyl, iohexol, macrodantin, red dye #40 (allura red), rocephin [ceftriaxone], and zithromax [azithromycin].  MEDICATIONS:  Current Outpatient Medications  Medication Sig Dispense Refill   cholestyramine (QUESTRAN) 4 g packet Take 1 packet (4 g total) by mouth 3 (three) times daily with meals. 60 each 0   doxycycline (VIBRA-TABS) 100 MG tablet Take 1 tablet (100 mg total) by mouth 2 (two) times daily. 14 tablet 0   mupirocin ointment (BACTROBAN) 2 % Apply 1 Application topically 2 (two) times daily. 22 g 0   butalbital-acetaminophen-caffeine (FIORICET) 50-325-40 MG tablet Take 1 tablet by mouth 2 (two) times daily as needed for headache or migraine. (Patient not taking: Reported on 10/18/2023)     cholecalciferol (VITAMIN D3) 25 MCG (1000 UNIT) tablet Take 1,000 Units by mouth daily. (Patient not taking: Reported on 10/18/2023)     dexamethasone (DECADRON) 4 MG tablet Take 2 tabs by mouth 2 times daily starting day before chemo. Then take 2 tabs daily for 2 days starting day  after chemo. Take with food. (Patient not taking: Reported on 10/18/2023) 30 tablet 1   Evolocumab (REPATHA [place]) Inject into the skin.     famotidine (PEPCID) 20 MG tablet Take 1 tablet (20 mg total) by mouth 2 (two) times daily. 60 tablet 0   levothyroxine (SYNTHROID) 25 MCG tablet Take 25 mcg by mouth daily before breakfast.     lidocaine (XYLOCAINE) 2 % solution Use as directed 15 mLs in the mouth or throat as needed for mouth pain. 480 mL 0   lidocaine-prilocaine (EMLA) cream Apply to affected  area once (Patient not taking: Reported on 10/18/2023) 30 g 3   loratadine (CLARITIN) 10 MG tablet Take 10 mg by mouth daily.     montelukast (SINGULAIR) 10 MG tablet Take 10 mg by mouth at bedtime.     ondansetron (ZOFRAN) 8 MG tablet Take 1 tablet (8 mg total) by mouth every 8 (eight) hours as needed for nausea or vomiting. Start on the third day after chemotherapy. (Patient not taking: Reported on 10/18/2023) 30 tablet 1   prochlorperazine (COMPAZINE) 10 MG tablet Take 1 tablet (10 mg total) by mouth every 6 (six) hours as needed for nausea or vomiting. (Patient not taking: Reported on 10/18/2023) 30 tablet 1   sertraline (ZOLOFT) 50 MG tablet Take 50 mg by mouth daily.     traMADol (ULTRAM) 50 MG tablet Take 1 tablet (50 mg total) by mouth every 12 (twelve) hours as needed. 30 tablet 0   traZODone (DESYREL) 100 MG tablet Take 100 mg by mouth at bedtime.     No current facility-administered medications for this visit.    REVIEW OF SYSTEMS:   Constitutional: Denies fevers, chills or abnormal night sweats Eyes: Denies blurriness of vision, double vision or watery eyes Ears, nose, mouth, throat, and face: Denies mucositis or sore throat Respiratory: Denies cough, dyspnea or wheezes Cardiovascular: Denies palpitation, chest discomfort or lower extremity swelling Gastrointestinal:  Denies nausea, heartburn or change in bowel habits Skin: Denies abnormal skin rashes Lymphatics: Denies new lymphadenopathy  or easy bruising Neurological:Denies numbness, tingling or new weaknesses Behavioral/Psych: Mood is stable, no new changes  Breast: As mentioned above All other systems were reviewed with the patient and are negative.  PHYSICAL EXAMINATION: ECOG PERFORMANCE STATUS: 0 - Asymptomatic  Vitals:   11/02/23 1111  BP: 122/86  Pulse: 78  Resp: 16  Temp: 98 F (36.7 C)  SpO2: 100%    Filed Weights   11/02/23 1111  Weight: 189 lb 9.6 oz (86 kg)     GENERAL appears tired, no acute pain or resp distress. Port site appears well CTA bilaterally NO LE edema.   LABORATORY DATA:  I have reviewed the data as listed Lab Results  Component Value Date   WBC 9.5 11/02/2023   HGB 14.0 11/02/2023   HCT 41.5 11/02/2023   MCV 92.4 11/02/2023   PLT 127 (L) 11/02/2023   Lab Results  Component Value Date   NA 141 11/02/2023   K 3.8 11/02/2023   CL 110 11/02/2023   CO2 25 11/02/2023    RADIOGRAPHIC STUDIES: I have personally reviewed the radiological reports and agreed with the findings in the report.  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  Patient experienced severe side effects from the first round of chemotherapy, including mucositis, loss of appetite, and fatigue. The breast mass appears to have softened. Discussed alternative treatment options including Kadcyla (an antibody-drug conjugate), reducing the chemotherapy regimen, or proceeding directly to surgery.  She is not willing to consider any other neoadjuvant chemotherapy at this time.  She tells me that her quality of life is not good and she does not want to consider any more neoadjuvant chemo or  Kadcyla -Communicated with Dr. Dwain Sarna, she will follow-up  with him to discuss on a surgical date. -Post-surgery, consider Kadcyla or Herceptin/Perjeta, avoiding traditional chemotherapy if possible.  Mucositis Patient experiencing rawness and bleeding in the nose, as well as a burning sensation. -Prescribe mupirocin gel for application in the nose.  Possible Sinusitis Patient reports green discharge from the nose. -Prescribe doxycycline. Although she is allergic to red dye, she has previously taken doxycycline and tolerated it well.  Diarrhea Imodium not effective. -We have discussed about Lomotil however she says she is allergic to fentanyl which has some cross-reactivity to Lomotil hence we have agreed to try the Questran.  General Health Maintenance -Continue to encourage patient to consume nutrient-dense foods and liquids as tolerated. -Plan to follow up after surgery.  We have had a lengthy discussion overall, she understands the standard of care.  I tried to recommend neoadjuvant Kadcyla based on Christine trial which has shown remarkable response but she is hoping not to have to go through any more of this before surgery.  She will consider adjuvant Kadcyla.   Total time spent: 40 min including history, exam, discussion of options, review of records, counseling and coordination of care  All questions were answered. The patient knows to call the clinic with any problems, questions or concerns.    Rachel Moulds, MD 11/02/23

## 2023-11-02 NOTE — Assessment & Plan Note (Signed)
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  Patient experienced severe side effects from the first round of chemotherapy, including mucositis, loss of appetite, and fatigue. The breast mass appears to have softened. Discussed alternative treatment options including Kadcyla (an antibody-drug conjugate), reducing the chemotherapy regimen, or proceeding directly to surgery.  She is not willing to consider any other neoadjuvant chemotherapy at this time.  She tells me that her quality of life is not good and she does not want to consider any more neoadjuvant chemo or Kadcyla -Communicated with Dr. Dwain Sarna, she will follow-up with him to discuss on a surgical date. -Post-surgery, consider Kadcyla or Herceptin/Perjeta, avoiding traditional chemotherapy if possible.  Mucositis Patient experiencing rawness and bleeding in the nose, as well as a burning sensation. -Prescribe mupirocin gel for application in the nose.  Possible Sinusitis Patient reports green discharge from the nose. -Prescribe doxycycline. Although she is allergic to red dye, she has previously taken doxycycline and tolerated it well.  Diarrhea Imodium not effective. -We have discussed about Lomotil however she says she is allergic to fentanyl which has some cross-reactivity to Lomotil hence we have agreed to try the Questran.  General Health Maintenance -Continue to encourage patient to consume nutrient-dense foods and liquids as tolerated. -Plan to follow up after surgery.  We have had a lengthy discussion overall, she understands the standard of care.  I tried to recommend neoadjuvant Kadcyla based on Christine trial which has shown remarkable response but she is hoping not to have to go through  any more of this before surgery.  She will consider adjuvant Kadcyla.

## 2023-11-06 ENCOUNTER — Other Ambulatory Visit: Payer: Self-pay | Admitting: General Surgery

## 2023-11-06 DIAGNOSIS — Z170 Estrogen receptor positive status [ER+]: Secondary | ICD-10-CM

## 2023-11-07 ENCOUNTER — Other Ambulatory Visit: Payer: Self-pay | Admitting: General Surgery

## 2023-11-07 DIAGNOSIS — C50212 Malignant neoplasm of upper-inner quadrant of left female breast: Secondary | ICD-10-CM

## 2023-11-08 MED FILL — Fosaprepitant Dimeglumine For IV Infusion 150 MG (Base Eq): INTRAVENOUS | Qty: 5 | Status: AC

## 2023-11-09 ENCOUNTER — Inpatient Hospital Stay: Payer: HMO

## 2023-11-09 ENCOUNTER — Inpatient Hospital Stay: Payer: HMO | Admitting: Hematology and Oncology

## 2023-11-10 ENCOUNTER — Other Ambulatory Visit: Payer: Self-pay

## 2023-11-10 ENCOUNTER — Encounter (HOSPITAL_BASED_OUTPATIENT_CLINIC_OR_DEPARTMENT_OTHER): Payer: Self-pay | Admitting: General Surgery

## 2023-11-11 ENCOUNTER — Ambulatory Visit: Payer: PPO

## 2023-11-14 ENCOUNTER — Encounter: Payer: Self-pay | Admitting: Hematology and Oncology

## 2023-11-15 ENCOUNTER — Other Ambulatory Visit: Payer: Self-pay | Admitting: General Surgery

## 2023-11-15 DIAGNOSIS — C50212 Malignant neoplasm of upper-inner quadrant of left female breast: Secondary | ICD-10-CM

## 2023-11-16 ENCOUNTER — Ambulatory Visit
Admission: RE | Admit: 2023-11-16 | Discharge: 2023-11-16 | Disposition: A | Discharge status: Home / Self Care | Payer: HMO | Source: Ambulatory Visit | Attending: General Surgery | Admitting: General Surgery

## 2023-11-16 ENCOUNTER — Other Ambulatory Visit: Payer: Self-pay | Admitting: General Surgery

## 2023-11-16 ENCOUNTER — Encounter: Payer: Self-pay | Admitting: Hematology and Oncology

## 2023-11-16 ENCOUNTER — Ambulatory Visit
Admission: RE | Admit: 2023-11-16 | Discharge: 2023-11-16 | Disposition: A | Discharge status: Home / Self Care | Payer: PPO | Source: Ambulatory Visit | Attending: General Surgery | Admitting: General Surgery

## 2023-11-16 DIAGNOSIS — C50212 Malignant neoplasm of upper-inner quadrant of left female breast: Secondary | ICD-10-CM

## 2023-11-16 HISTORY — PX: BREAST BIOPSY: SHX20

## 2023-11-16 MED ORDER — CHLORHEXIDINE GLUCONATE CLOTH 2 % EX PADS: 6.0000 | MEDICATED_PAD | Freq: Once | CUTANEOUS | Status: DC

## 2023-11-16 NOTE — Progress Notes (Signed)
Surgical soap and ERAS drink given to patient, instructions given, patient verbalized understanding.

## 2023-11-19 NOTE — Anesthesia Preprocedure Evaluation (Addendum)
 Anesthesia Evaluation  Patient identified by MRN, date of birth, ID band Patient awake  General Assessment Comment:Complication of anesthesia states she has had 2 neck surgeries and her neck is stiff, hard to wake  Reviewed: Allergy & Precautions, NPO status , Patient's Chart, lab work & pertinent test results  History of Anesthesia Complications (+) history of anesthetic complications  Airway Mallampati: II  TM Distance: >3 FB Neck ROM: Full    Dental no notable dental hx. (+) Teeth Intact, Dental Advisory Given   Pulmonary sleep apnea    Pulmonary exam normal breath sounds clear to auscultation       Cardiovascular hypertension, Pt. on medications Normal cardiovascular exam Rhythm:Regular Rate:Normal  ECHO 1/25 1. Left ventricular ejection fraction, by estimation, is 60 to 65%. Left ventricular ejection fraction by 3D volume is 55 %. The left ventricle has normal function. The left ventricle has no regional wall motion abnormalities. Left ventricular diastolic  parameters are consistent with Grade I diastolic dysfunction (impaired relaxation). The average left ventricular global longitudinal strain is -20.9 %. The global longitudinal strain is normal.  2. Right ventricular systolic function is normal. The right ventricular size is normal. There is normal pulmonary artery systolic pressure. The estimated right ventricular systolic pressure is 29.6 mmHg.  3. The mitral valve is normal in structure. No evidence of mitral valve regurgitation. No evidence of mitral stenosis.  4. The aortic valve is tricuspid. Aortic valve regurgitation is not visualized. No aortic stenosis is present.  5. The inferior vena cava is normal in size with greater than 50% respiratory variability, suggesting right atrial pressure of 3 mmHg.    Neuro/Psych  PSYCHIATRIC DISORDERS  Depression    negative neurological ROS     GI/Hepatic negative GI ROS, Neg  liver ROS,,,  Endo/Other  Hypothyroidism    Renal/GU Renal diseasenegative Renal ROS  negative genitourinary   Musculoskeletal  (+) Arthritis , Osteoarthritis,    Abdominal   Peds  Hematology negative hematology ROS (+)   Anesthesia Other Findings   Reproductive/Obstetrics                             Anesthesia Physical Anesthesia Plan  ASA: 3  Anesthesia Plan: General   Post-op Pain Management: Regional block*   Induction: Intravenous  PONV Risk Score and Plan: 2 and Treatment may vary due to age or medical condition, Ondansetron, Propofol infusion, TIVA and Midazolam  Airway Management Planned: LMA  Additional Equipment: None  Intra-op Plan:   Post-operative Plan: Extubation in OR  Informed Consent: I have reviewed the patients History and Physical, chart, labs and discussed the procedure including the risks, benefits and alternatives for the proposed anesthesia with the patient or authorized representative who has indicated his/her understanding and acceptance.     Dental advisory given  Plan Discussed with: CRNA, Surgeon and Anesthesiologist  Anesthesia Plan Comments: (Discussed both nerve block for pain relief post-op and GA; including NV, sore throat, dental injury, and pulmonary complications)        Anesthesia Quick Evaluation

## 2023-11-20 ENCOUNTER — Ambulatory Visit
Admission: RE | Admit: 2023-11-20 | Discharge: 2023-11-20 | Disposition: A | Discharge status: Home / Self Care | Payer: PPO | Source: Ambulatory Visit | Attending: General Surgery | Admitting: General Surgery

## 2023-11-20 ENCOUNTER — Encounter (HOSPITAL_BASED_OUTPATIENT_CLINIC_OR_DEPARTMENT_OTHER): Payer: Self-pay | Admitting: General Surgery

## 2023-11-20 ENCOUNTER — Ambulatory Visit (HOSPITAL_BASED_OUTPATIENT_CLINIC_OR_DEPARTMENT_OTHER): Payer: HMO | Admitting: Anesthesiology

## 2023-11-20 ENCOUNTER — Other Ambulatory Visit: Payer: Self-pay

## 2023-11-20 ENCOUNTER — Encounter (HOSPITAL_BASED_OUTPATIENT_CLINIC_OR_DEPARTMENT_OTHER)
Admission: RE | Disposition: A | Discharge status: Home / Self Care | Payer: Self-pay | Source: Home / Self Care | Attending: General Surgery

## 2023-11-20 ENCOUNTER — Ambulatory Visit (HOSPITAL_BASED_OUTPATIENT_CLINIC_OR_DEPARTMENT_OTHER): Payer: Self-pay | Admitting: Anesthesiology

## 2023-11-20 ENCOUNTER — Observation Stay (HOSPITAL_BASED_OUTPATIENT_CLINIC_OR_DEPARTMENT_OTHER)
Admission: RE | Admit: 2023-11-20 | Discharge: 2023-11-21 | Disposition: A | Discharge status: Home / Self Care | Payer: HMO | Attending: General Surgery | Admitting: General Surgery

## 2023-11-20 DIAGNOSIS — E039 Hypothyroidism, unspecified: Secondary | ICD-10-CM | POA: Diagnosis not present

## 2023-11-20 DIAGNOSIS — Z96642 Presence of left artificial hip joint: Secondary | ICD-10-CM | POA: Insufficient documentation

## 2023-11-20 DIAGNOSIS — C50912 Malignant neoplasm of unspecified site of left female breast: Secondary | ICD-10-CM

## 2023-11-20 DIAGNOSIS — I10 Essential (primary) hypertension: Secondary | ICD-10-CM | POA: Insufficient documentation

## 2023-11-20 DIAGNOSIS — C773 Secondary and unspecified malignant neoplasm of axilla and upper limb lymph nodes: Secondary | ICD-10-CM | POA: Diagnosis not present

## 2023-11-20 DIAGNOSIS — F32A Depression, unspecified: Secondary | ICD-10-CM | POA: Diagnosis not present

## 2023-11-20 DIAGNOSIS — Z171 Estrogen receptor negative status [ER-]: Secondary | ICD-10-CM | POA: Insufficient documentation

## 2023-11-20 DIAGNOSIS — C50212 Malignant neoplasm of upper-inner quadrant of left female breast: Secondary | ICD-10-CM

## 2023-11-20 DIAGNOSIS — Z95828 Presence of other vascular implants and grafts: Principal | ICD-10-CM

## 2023-11-20 DIAGNOSIS — Z7982 Long term (current) use of aspirin: Secondary | ICD-10-CM | POA: Diagnosis not present

## 2023-11-20 DIAGNOSIS — Z79899 Other long term (current) drug therapy: Secondary | ICD-10-CM | POA: Insufficient documentation

## 2023-11-20 DIAGNOSIS — D0512 Intraductal carcinoma in situ of left breast: Secondary | ICD-10-CM | POA: Diagnosis present

## 2023-11-20 HISTORY — PX: BREAST LUMPECTOMY WITH RADIOACTIVE SEED AND SENTINEL LYMPH NODE BIOPSY: SHX6550

## 2023-11-20 HISTORY — PX: RADIOACTIVE SEED GUIDED AXILLARY SENTINEL LYMPH NODE: SHX6735

## 2023-11-20 SURGERY — BREAST LUMPECTOMY WITH RADIOACTIVE SEED AND SENTINEL LYMPH NODE BIOPSY
Anesthesia: General | Site: Breast | Laterality: Left

## 2023-11-20 MED ORDER — CIPROFLOXACIN IN D5W 400 MG/200ML IV SOLN
INTRAVENOUS | Status: AC
  Filled 2023-11-20: qty 200

## 2023-11-20 MED ORDER — BUPIVACAINE HCL (PF) 0.25 % IJ SOLN
INTRAMUSCULAR | Status: DC | PRN
  Administered 2023-11-20: 5 mL

## 2023-11-20 MED ORDER — ACETAMINOPHEN 500 MG PO TABS
1000.0000 mg | ORAL_TABLET | ORAL | Status: AC
  Administered 2023-11-20: 1000 mg via ORAL

## 2023-11-20 MED ORDER — BUPIVACAINE HCL (PF) 0.25 % IJ SOLN
INTRAMUSCULAR | Status: AC
  Filled 2023-11-20: qty 90

## 2023-11-20 MED ORDER — PHENYLEPHRINE 80 MCG/ML (10ML) SYRINGE FOR IV PUSH (FOR BLOOD PRESSURE SUPPORT)
PREFILLED_SYRINGE | INTRAVENOUS | Status: DC | PRN
  Administered 2023-11-20 (×3): 160 ug via INTRAVENOUS
  Administered 2023-11-20 (×2): 80 ug via INTRAVENOUS

## 2023-11-20 MED ORDER — HYDROMORPHONE HCL 1 MG/ML IJ SOLN: 0.5000 mg | INTRAMUSCULAR | Status: DC | PRN

## 2023-11-20 MED ORDER — TRAZODONE HCL 100 MG PO TABS
100.0000 mg | ORAL_TABLET | Freq: Every day | ORAL | Status: DC
  Administered 2023-11-20: 100 mg via ORAL
  Filled 2023-11-20: qty 1

## 2023-11-20 MED ORDER — MIDAZOLAM HCL 2 MG/2ML IJ SOLN
INTRAMUSCULAR | Status: AC
  Filled 2023-11-20: qty 2

## 2023-11-20 MED ORDER — BUPIVACAINE HCL (PF) 0.25 % IJ SOLN
INTRAMUSCULAR | Status: DC | PRN
  Administered 2023-11-20: 30 mL via PERINEURAL

## 2023-11-20 MED ORDER — MONTELUKAST SODIUM 10 MG PO TABS
10.0000 mg | ORAL_TABLET | Freq: Every day | ORAL | Status: DC
  Administered 2023-11-20: 10 mg via ORAL
  Filled 2023-11-20: qty 1

## 2023-11-20 MED ORDER — LORATADINE 10 MG PO TABS
10.0000 mg | ORAL_TABLET | Freq: Every day | ORAL | Status: DC
  Administered 2023-11-20: 10 mg via ORAL
  Filled 2023-11-20: qty 1

## 2023-11-20 MED ORDER — EPHEDRINE 5 MG/ML INJ
INTRAVENOUS | Status: AC
  Filled 2023-11-20: qty 5

## 2023-11-20 MED ORDER — METHOCARBAMOL 500 MG PO TABS
500.0000 mg | ORAL_TABLET | Freq: Three times a day (TID) | ORAL | Status: DC | PRN
  Administered 2023-11-21: 500 mg via ORAL
  Filled 2023-11-20: qty 1

## 2023-11-20 MED ORDER — LORATADINE 10 MG PO TABS
10.0000 mg | ORAL_TABLET | Freq: Every day | ORAL | Status: DC
  Filled 2023-11-20: qty 1

## 2023-11-20 MED ORDER — FENTANYL CITRATE (PF) 100 MCG/2ML IJ SOLN
INTRAMUSCULAR | Status: AC
Start: 2023-11-20 — End: ?
  Filled 2023-11-20: qty 2

## 2023-11-20 MED ORDER — PHENYLEPHRINE 80 MCG/ML (10ML) SYRINGE FOR IV PUSH (FOR BLOOD PRESSURE SUPPORT)
PREFILLED_SYRINGE | INTRAVENOUS | Status: AC
  Filled 2023-11-20: qty 10

## 2023-11-20 MED ORDER — ONDANSETRON HCL 4 MG/2ML IJ SOLN
INTRAMUSCULAR | Status: DC | PRN
  Administered 2023-11-20: 4 mg via INTRAVENOUS

## 2023-11-20 MED ORDER — FENTANYL CITRATE (PF) 100 MCG/2ML IJ SOLN: 25.0000 ug | INTRAMUSCULAR | Status: DC | PRN

## 2023-11-20 MED ORDER — OXYCODONE HCL 5 MG PO TABS: 5.0000 mg | ORAL_TABLET | Freq: Once | ORAL | Status: DC | PRN

## 2023-11-20 MED ORDER — BUTALBITAL-APAP-CAFFEINE 50-325-40 MG PO TABS: 1.0000 | ORAL_TABLET | Freq: Two times a day (BID) | ORAL | Status: DC | PRN

## 2023-11-20 MED ORDER — LEVOTHYROXINE SODIUM 25 MCG PO TABS
25.0000 ug | ORAL_TABLET | Freq: Every day | ORAL | Status: DC
  Filled 2023-11-20: qty 1

## 2023-11-20 MED ORDER — OXYCODONE HCL 5 MG/5ML PO SOLN: 5.0000 mg | Freq: Once | ORAL | Status: DC | PRN

## 2023-11-20 MED ORDER — MIDAZOLAM HCL 2 MG/2ML IJ SOLN
2.0000 mg | Freq: Once | INTRAMUSCULAR | Status: AC
  Administered 2023-11-20: 2 mg via INTRAVENOUS

## 2023-11-20 MED ORDER — PROPOFOL 10 MG/ML IV BOLUS
INTRAVENOUS | Status: DC | PRN
  Administered 2023-11-20: 200 ug/kg/min via INTRAVENOUS
  Administered 2023-11-20: 200 mg via INTRAVENOUS
  Administered 2023-11-20: 200 ug/kg/min via INTRAVENOUS

## 2023-11-20 MED ORDER — EPHEDRINE SULFATE-NACL 50-0.9 MG/10ML-% IV SOSY
PREFILLED_SYRINGE | INTRAVENOUS | Status: DC | PRN
  Administered 2023-11-20: 5 mg via INTRAVENOUS

## 2023-11-20 MED ORDER — KETOROLAC TROMETHAMINE 30 MG/ML IJ SOLN
INTRAMUSCULAR | Status: AC
  Filled 2023-11-20: qty 1

## 2023-11-20 MED ORDER — TRAMADOL HCL 50 MG PO TABS
50.0000 mg | ORAL_TABLET | Freq: Four times a day (QID) | ORAL | Status: DC | PRN
  Administered 2023-11-20 (×2): 50 mg via ORAL
  Filled 2023-11-20 (×2): qty 1

## 2023-11-20 MED ORDER — DEXAMETHASONE SODIUM PHOSPHATE 10 MG/ML IJ SOLN
INTRAMUSCULAR | Status: AC
  Filled 2023-11-20: qty 1

## 2023-11-20 MED ORDER — ACETAMINOPHEN 160 MG/5ML PO SOLN: 325.0000 mg | ORAL | Status: DC | PRN

## 2023-11-20 MED ORDER — MEPERIDINE HCL 25 MG/ML IJ SOLN: 6.2500 mg | INTRAMUSCULAR | Status: DC | PRN

## 2023-11-20 MED ORDER — FAMOTIDINE 20 MG PO TABS
20.0000 mg | ORAL_TABLET | Freq: Two times a day (BID) | ORAL | Status: DC
  Filled 2023-11-20: qty 1

## 2023-11-20 MED ORDER — METHOCARBAMOL 1000 MG/10ML IJ SOLN: 500.0000 mg | Freq: Three times a day (TID) | INTRAMUSCULAR | Status: DC | PRN

## 2023-11-20 MED ORDER — SODIUM CHLORIDE 0.9 % IV SOLN: INTRAVENOUS | Status: DC

## 2023-11-20 MED ORDER — CIPROFLOXACIN IN D5W 400 MG/200ML IV SOLN: 400.0000 mg | INTRAVENOUS | Status: DC

## 2023-11-20 MED ORDER — DEXMEDETOMIDINE HCL IN NACL 80 MCG/20ML IV SOLN
INTRAVENOUS | Status: DC | PRN
  Administered 2023-11-20: 12 ug via INTRAVENOUS

## 2023-11-20 MED ORDER — 0.9 % SODIUM CHLORIDE (POUR BTL) OPTIME
TOPICAL | Status: DC | PRN
  Administered 2023-11-20: 700 mL

## 2023-11-20 MED ORDER — LIDOCAINE 2% (20 MG/ML) 5 ML SYRINGE
INTRAMUSCULAR | Status: DC | PRN
  Administered 2023-11-20: 40 mg via INTRAVENOUS

## 2023-11-20 MED ORDER — LIDOCAINE 2% (20 MG/ML) 5 ML SYRINGE
INTRAMUSCULAR | Status: AC
  Filled 2023-11-20: qty 5

## 2023-11-20 MED ORDER — LACTATED RINGERS IV SOLN: INTRAVENOUS | Status: DC

## 2023-11-20 MED ORDER — ACETAMINOPHEN 500 MG PO TABS
ORAL_TABLET | ORAL | Status: AC
  Filled 2023-11-20: qty 2

## 2023-11-20 MED ORDER — FENTANYL CITRATE (PF) 100 MCG/2ML IJ SOLN
INTRAMUSCULAR | Status: AC
  Filled 2023-11-20: qty 2

## 2023-11-20 MED ORDER — ENSURE PRE-SURGERY PO LIQD: 296.0000 mL | Freq: Once | ORAL | Status: DC

## 2023-11-20 MED ORDER — ONDANSETRON HCL 4 MG/2ML IJ SOLN
INTRAMUSCULAR | Status: AC
  Filled 2023-11-20: qty 2

## 2023-11-20 MED ORDER — ACETAMINOPHEN 325 MG PO TABS: 325.0000 mg | ORAL_TABLET | ORAL | Status: DC | PRN

## 2023-11-20 MED ORDER — KETOROLAC TROMETHAMINE 30 MG/ML IJ SOLN
INTRAMUSCULAR | Status: DC | PRN
  Administered 2023-11-20: 30 mg via INTRAVENOUS

## 2023-11-20 MED ORDER — ONDANSETRON HCL 4 MG/2ML IJ SOLN: 4.0000 mg | Freq: Once | INTRAMUSCULAR | Status: DC | PRN

## 2023-11-20 MED ORDER — HYDROMORPHONE HCL 1 MG/ML IJ SOLN
INTRAMUSCULAR | Status: AC
  Filled 2023-11-20: qty 0.5

## 2023-11-20 MED ORDER — SERTRALINE HCL 50 MG PO TABS
50.0000 mg | ORAL_TABLET | Freq: Every day | ORAL | Status: DC
  Filled 2023-11-20: qty 1

## 2023-11-20 MED ORDER — DEXAMETHASONE SODIUM PHOSPHATE 10 MG/ML IJ SOLN
INTRAMUSCULAR | Status: DC | PRN
  Administered 2023-11-20 (×2): 10 mg

## 2023-11-20 MED ORDER — HYDROMORPHONE HCL 1 MG/ML IJ SOLN
0.5000 mg | INTRAMUSCULAR | Status: DC | PRN
  Administered 2023-11-20: 0.25 mg via INTRAVENOUS

## 2023-11-20 MED ORDER — CEFAZOLIN SODIUM-DEXTROSE 2-3 GM-%(50ML) IV SOLR
INTRAVENOUS | Status: DC | PRN
  Administered 2023-11-20: 2 g via INTRAVENOUS

## 2023-11-20 SURGICAL SUPPLY — 49 items
APPLIER CLIP 9.375 MED OPEN (MISCELLANEOUS) IMPLANT
BINDER BREAST LRG (GAUZE/BANDAGES/DRESSINGS) IMPLANT
BINDER BREAST MEDIUM (GAUZE/BANDAGES/DRESSINGS) IMPLANT
BINDER BREAST XLRG (GAUZE/BANDAGES/DRESSINGS) IMPLANT
BINDER BREAST XXLRG (GAUZE/BANDAGES/DRESSINGS) IMPLANT
BLADE SURG 15 STRL LF DISP TIS (BLADE) ×1 IMPLANT
CANISTER SUC SOCK COL 7IN (MISCELLANEOUS) IMPLANT
CANISTER SUCT 1200ML W/VALVE (MISCELLANEOUS) IMPLANT
CHLORAPREP W/TINT 26 (MISCELLANEOUS) ×1 IMPLANT
CLIP APPLIE 9.375 MED OPEN (MISCELLANEOUS) IMPLANT
CLIP TI WIDE RED SMALL 6 (CLIP) ×1 IMPLANT
COVER BACK TABLE 60X90IN (DRAPES) ×1 IMPLANT
COVER MAYO STAND STRL (DRAPES) ×1 IMPLANT
COVER PROBE CYLINDRICAL 5X96 (MISCELLANEOUS) ×1 IMPLANT
DERMABOND ADVANCED .7 DNX12 (GAUZE/BANDAGES/DRESSINGS) ×1 IMPLANT
DRAPE LAPAROSCOPIC ABDOMINAL (DRAPES) ×1 IMPLANT
DRAPE UTILITY XL STRL (DRAPES) ×1 IMPLANT
ELECT COATED BLADE 2.86 ST (ELECTRODE) ×1 IMPLANT
ELECT REM PT RETURN 9FT ADLT (ELECTROSURGICAL) ×1 IMPLANT
ELECTRODE REM PT RTRN 9FT ADLT (ELECTROSURGICAL) ×1 IMPLANT
GLOVE BIO SURGEON STRL SZ7 (GLOVE) ×2 IMPLANT
GLOVE BIOGEL PI IND STRL 7.5 (GLOVE) ×1 IMPLANT
GOWN STRL REUS W/ TWL LRG LVL3 (GOWN DISPOSABLE) ×2 IMPLANT
HEMOSTAT ARISTA ABSORB 3G PWDR (HEMOSTASIS) IMPLANT
KIT MARKER MARGIN INK (KITS) ×1 IMPLANT
NDL HYPO 25X1 1.5 SAFETY (NEEDLE) ×1 IMPLANT
NDL SAFETY ECLIPSE 18X1.5 (NEEDLE) IMPLANT
NEEDLE HYPO 25X1 1.5 SAFETY (NEEDLE) ×1 IMPLANT
NS IRRIG 1000ML POUR BTL (IV SOLUTION) IMPLANT
PACK BASIN DAY SURGERY FS (CUSTOM PROCEDURE TRAY) ×1 IMPLANT
PENCIL SMOKE EVACUATOR (MISCELLANEOUS) ×1 IMPLANT
RETRACTOR ONETRAX LX 90X20 (MISCELLANEOUS) IMPLANT
SLEEVE SCD COMPRESS KNEE MED (STOCKING) ×1 IMPLANT
SPIKE FLUID TRANSFER (MISCELLANEOUS) IMPLANT
SPONGE T-LAP 4X18 ~~LOC~~+RFID (SPONGE) ×1 IMPLANT
STRIP CLOSURE SKIN 1/2X4 (GAUZE/BANDAGES/DRESSINGS) ×1 IMPLANT
SUT ETHILON 2 0 FS 18 (SUTURE) IMPLANT
SUT MNCRL AB 4-0 PS2 18 (SUTURE) ×1 IMPLANT
SUT MON AB 5-0 PS2 18 (SUTURE) IMPLANT
SUT SILK 2 0 SH (SUTURE) IMPLANT
SUT VIC AB 2-0 SH 27XBRD (SUTURE) ×1 IMPLANT
SUT VIC AB 3-0 SH 27X BRD (SUTURE) ×1 IMPLANT
SUT VIC AB 5-0 PS2 18 (SUTURE) IMPLANT
SYR CONTROL 10ML LL (SYRINGE) ×1 IMPLANT
TOWEL GREEN STERILE FF (TOWEL DISPOSABLE) ×1 IMPLANT
TRACER MAGTRACE VIAL (MISCELLANEOUS) IMPLANT
TRAY FAXITRON CT DISP (TRAY / TRAY PROCEDURE) ×1 IMPLANT
TUBE CONNECTING 20X1/4 (TUBING) IMPLANT
YANKAUER SUCT BULB TIP NO VENT (SUCTIONS) IMPLANT

## 2023-11-20 NOTE — Anesthesia Procedure Notes (Signed)
 Procedure Name: LMA Insertion Date/Time: 11/20/2023 9:12 AM  Performed by: Yolanda Bonine, CRNAPre-anesthesia Checklist: Patient identified, Emergency Drugs available, Suction available, Patient being monitored and Timeout performed Patient Re-evaluated:Patient Re-evaluated prior to induction Oxygen Delivery Method: Circle system utilized Preoxygenation: Pre-oxygenation with 100% oxygen Induction Type: IV induction Ventilation: Mask ventilation without difficulty LMA: LMA with gastric port inserted LMA Size: 4.0 Number of attempts: 1 Placement Confirmation: positive ETCO2 Tube secured with: Tape Dental Injury: Teeth and Oropharynx as per pre-operative assessment

## 2023-11-20 NOTE — Transfer of Care (Signed)
 Immediate Anesthesia Transfer of Care Note  Patient: Rebecca Steele  Procedure(s) Performed: LEFT BREAST SEED GUIDED LUMPECTOMY, LEFT AXILLARY SENTINEL NODE BIOPSY (Left: Breast) LEFT AXILLARY NODE SEED GUIDED EXCISION (Left: Breast)  Patient Location: PACU  Anesthesia Type:GA combined with regional for post-op pain  Level of Consciousness: drowsy  Airway & Oxygen Therapy: Patient Spontanous Breathing and Patient connected to face mask oxygen  Post-op Assessment: Report given to RN and Patient moving all extremities  Post vital signs: Reviewed and stable  Last Vitals:  Vitals Value Taken Time  BP 110/54 11/20/23 1100  Temp 36.2 C 11/20/23 1059  Pulse 84 11/20/23 1106  Resp 23 11/20/23 1106  SpO2 98 % 11/20/23 1106  Vitals shown include unfiled device data.  Last Pain:  Vitals:   11/20/23 1059  TempSrc:   PainSc: Asleep         Complications: No notable events documented.

## 2023-11-20 NOTE — Anesthesia Procedure Notes (Signed)
 Anesthesia Regional Block: Pectoralis block   Pre-Anesthetic Checklist: , timeout performed,  Correct Patient, Correct Site, Correct Laterality,  Correct Procedure, Correct Position, site marked,  Risks and benefits discussed,  Surgical consent,  Pre-op evaluation,  At surgeon's request and post-op pain management  Laterality: Left  Prep: chloraprep       Needles:  Injection technique: Single-shot  Needle Type: Echogenic Stimulator Needle     Needle Length: 5cm  Needle Gauge: 22     Additional Needles:   Procedures:,,,, ultrasound used (permanent image in chart),,    Narrative:  Start time: 11/20/2023 8:55 AM End time: 11/20/2023 9:00 AM Injection made incrementally with aspirations every 5 mL.  Performed by: Personally  Anesthesiologist: Bethena Midget, MD  Additional Notes: Functioning IV was confirmed and monitors were applied.  A 50mm 22ga Arrow echogenic stimulator needle was used. Sterile prep and drape,hand hygiene and sterile gloves were used. Ultrasound guidance: relevant anatomy identified, needle position confirmed, local anesthetic spread visualized around nerve(s)., vascular puncture avoided.  Image printed for medical record. Negative aspiration and negative test dose prior to incremental administration of local anesthetic. The patient tolerated the procedure well.

## 2023-11-20 NOTE — Op Note (Signed)
 Preoperative diagnosis: Clinical stage II left breast cancer Postoperative diagnosis: Same as above Procedure: 1.  Left breast radioactive seed guided lumpectomy 2.  Left deep axillary sentinel lymph node biopsy 3.  Left axillary radioactive seed guided node excision Surgeon: Dr. Harden Mo Anesthesia: General With a pectoral block Estimated blood loss: 75 cc Specimens: 1.  Left breast tissue containing clip and 2 seeds marked with paint 2.  Additional lateral and posterior tissue marked short superior, long lateral, double deep 3.  Left radioactive seed containing node, this is also a sentinel node 4.  Additional left axillary sentinel lymph nodes Complications: None Drains: None Sponge and count was correct completion Disposition recovery stable addition  Indications: This is a 73 year old female who had a left breast mass that was noted to be 2.7 cm on imaging.  There were 2 abnormal nodes.  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  She had a positive lymph node biopsy.  She underwent 1 cycle of TCHP and does not want to continue with that.  We discussed proceeding with surgery now and then will consider adjuvant therapy.  Procedure: After informed consent was obtained she first underwent a pectoral block.  I had her images available for review.  One of the seeds had migrated when it was placed so there were 2 seeds in the breast.  She was given antibiotics.  SCDs were in place.  She was placed under general anesthesia without complication.  She was prepped and draped in a standard sterile surgical fashion.  I had injected mag trace in my office last week already.  I located the seeds in the upper inner quadrant and central superior breast.  I elected to make a curvilinear incision overlying these as they were fairly close to the skin.  I then used cautery to remove both seeds and the surrounding tissue with an attempt to  get a clear margin.  Mammogram confirmed removal of both seeds and the clip.  I did a 3D imaging I thought I might be close to my lateral and posterior margin so I excised these and marked these as above.  Hemostasis was then obtained.  I placed a couple clips in the cavity.  I closed down this cavity with several layers of 2-0 Vicryl.  The skin was closed with 3-0 Vicryl and the skin with 4-0 Monocryl.  Glue and Steri-Strips were eventually applied.  I then was able to identify the radioactive seed in the axilla.  I made an incision below the axillary hairline and carried this through the axillary fascia.  Identified the seed containing node of which there are couple of nodes that look like they were attached to it.  I remove these.  The seed and clip were both confirmed removed by mammography.  This was also a sentinel node as it had a faint mag trace activity in it.  I removed another bundle of nodes that were palpable and had some low activity and then a single node that the count was significantly higher as well.  There was no background activity.  There are no more palpable nodes present.  I then obtained hemostasis.  I did place some Arista in this cavity.  I closed it down with 2-0 Vicryl, 3-0 Vicryl, and 4 Monocryl.  Glue and Steri-Strips were applied.  She tolerated this well was extubated and transferred to recovery.

## 2023-11-20 NOTE — H&P (Signed)
 23 yof who has prior history of right sided excisional biopsies by me who presents with new left breast mass. On mm/us she has c density tissue with a 2.7 cm left breast mass. There are 2 abnormal nodes. Biopsy is a grade III IDC that is er/pr negative and her 2 positive The node is positive. She had one cycle of tchp with such significant side effects that she does not want to continue. She would like to proceed with surgery now and then consider advjuvant therapy.  Medical History: Past Medical History:  Diagnosis Date  Anxiety  Hypertension  Thyroid disease   Patient Active Problem List  Diagnosis  Invasive ductal carcinoma of breast, left (CMS/HHS-HCC)   Past Surgical History:  Procedure Laterality Date  APPENDECTOMY  ARTHROPLASTY HIP TOTAL  BREAST EXCISIONAL BIOPSY Right  CHOLECYSTECTOMY  KYPHOPLASTY  Radioactive seed guided excisional breast biopsy  TONSILLECTOMY   Allergies  Allergen Reactions  Diazepam Anaphylaxis, Palpitations, Other (See Comments) and Shortness Of Breath  Gadolinium-Containing Contrast Media Anaphylaxis, Other (See Comments), Shortness Of Breath and Unknown  Iodine Anaphylaxis  Iohexol Itching  Doxycycline Swelling  Facial swelling  Doxycycline Calcium Swelling  Gabapentin Hives and Other (See Comments)  Nirmatrelvir-Ritonavir Hives  Nitrofurantoin Nausea And Vomiting and Vomiting  Red Dye Swelling  Facial swelling (cannot take pink or red tablets or capsules)   Current Outpatient Medications on File Prior to Visit  Medication Sig Dispense Refill  levothyroxine (SYNTHROID) 75 MCG tablet TAKE 1 TABLET BY MOUTH EVERY DAY IN THE MORNING ON EMPTY STOMACH  loratadine (CLARITIN) 10 mg tablet Take by mouth  montelukast (SINGULAIR) 10 mg tablet Take 1 tablet by mouth once daily  albuterol 90 mcg/actuation inhaler INHALE 1-2 PUFFS EVERY 6 HOURS AS NEEDED  aspirin 325 MG EC tablet Take by mouth  oxyBUTYnin (DITROPAN-XL) 5 MG XL tablet Take by mouth     Family History  Problem Relation Age of Onset  Skin cancer Mother  High blood pressure (Hypertension) Mother  Hyperlipidemia (Elevated cholesterol) Mother  Heart valve disease Father  High blood pressure (Hypertension) Father  Hyperlipidemia (Elevated cholesterol) Father  Diabetes Father    Social History   Tobacco Use  Smoking Status Never  Smokeless Tobacco Never  Marital status: Married  Tobacco Use  Smoking status: Never  Smokeless tobacco: Never  Substance and Sexual Activity  Alcohol use: Never  Drug use: Never   Objective:  Vitals:  11/14/23 1534  PainSc: 0-No pain  PainLoc: Breast   Physical Exam   General nad Right sided port in place Left breast mass is less distinct, no ax adenopathy   Assessment and Plan:   Invasive ductal carcinoma of breast, left (CMS/HHS-HCC)  Left breast seed guided lumpectomy, left ax sn biopsy (I injected magtrace today), left ax node seed guided excision  She would like to just proceed with surgery. Discussed preference would be to continue systemic therapy.  We discussed a sentinel lymph node biopsy and seed guided node excision (so called TAD)> We discussed the performance of that with injection of Magtrace which I injected under sterile conditions today 1.5 cc in subareolar position. Discussed possibility of returning to OR for alnd. Lymphedema risk discussed.  We discussed the options for treatment of the breast cancer which included lumpectomy versus a mastectomy. We discussed the performance of the lumpectomy with radioactive seed placement. We discussed a 5-10% chance of a positive margin requiring reexcision in the operating room. We also discussed that she will likely need radiation therapy  if she undergoes lumpectomy. We discussed mastectomy and the postoperative care for that as well. Mastectomy can be followed by reconstruction. The decision for lumpectomy vs mastectomy has no impact on decision for chemotherapy.  Most mastectomy patients will not need radiation therapy. We discussed that there is no difference in her survival whether she undergoes lumpectomy with radiation therapy or antiestrogen therapy versus a mastectomy. There is also no real difference between her recurrence in the breast.  We discussed the risks of operation including bleeding, infection, possible reoperation. She understands her further therapy will be based on what her stages at the time of her operation.

## 2023-11-20 NOTE — Anesthesia Postprocedure Evaluation (Signed)
 Anesthesia Post Note  Patient: Rebecca Steele  Procedure(s) Performed: LEFT BREAST SEED GUIDED LUMPECTOMY, LEFT AXILLARY SENTINEL NODE BIOPSY (Left: Breast) LEFT AXILLARY NODE SEED GUIDED EXCISION (Left: Breast)     Anesthesia Type: General Anesthetic complications: no   No notable events documented.  Last Vitals:  Vitals:   11/20/23 1200 11/20/23 1230  BP: 124/66 (!) 146/73  Pulse: 82 79  Resp: 18 18  Temp:  (!) 36.2 C  SpO2: 95% 95%    Last Pain:  Vitals:   11/20/23 1225  TempSrc:   PainSc: 3                  Sarely Stracener

## 2023-11-20 NOTE — H&P (View-Only) (Signed)
 23 yof who has prior history of right sided excisional biopsies by me who presents with new left breast mass. On mm/us she has c density tissue with a 2.7 cm left breast mass. There are 2 abnormal nodes. Biopsy is a grade III IDC that is er/pr negative and her 2 positive The node is positive. She had one cycle of tchp with such significant side effects that she does not want to continue. She would like to proceed with surgery now and then consider advjuvant therapy.  Medical History: Past Medical History:  Diagnosis Date  Anxiety  Hypertension  Thyroid disease   Patient Active Problem List  Diagnosis  Invasive ductal carcinoma of breast, left (CMS/HHS-HCC)   Past Surgical History:  Procedure Laterality Date  APPENDECTOMY  ARTHROPLASTY HIP TOTAL  BREAST EXCISIONAL BIOPSY Right  CHOLECYSTECTOMY  KYPHOPLASTY  Radioactive seed guided excisional breast biopsy  TONSILLECTOMY   Allergies  Allergen Reactions  Diazepam Anaphylaxis, Palpitations, Other (See Comments) and Shortness Of Breath  Gadolinium-Containing Contrast Media Anaphylaxis, Other (See Comments), Shortness Of Breath and Unknown  Iodine Anaphylaxis  Iohexol Itching  Doxycycline Swelling  Facial swelling  Doxycycline Calcium Swelling  Gabapentin Hives and Other (See Comments)  Nirmatrelvir-Ritonavir Hives  Nitrofurantoin Nausea And Vomiting and Vomiting  Red Dye Swelling  Facial swelling (cannot take pink or red tablets or capsules)   Current Outpatient Medications on File Prior to Visit  Medication Sig Dispense Refill  levothyroxine (SYNTHROID) 75 MCG tablet TAKE 1 TABLET BY MOUTH EVERY DAY IN THE MORNING ON EMPTY STOMACH  loratadine (CLARITIN) 10 mg tablet Take by mouth  montelukast (SINGULAIR) 10 mg tablet Take 1 tablet by mouth once daily  albuterol 90 mcg/actuation inhaler INHALE 1-2 PUFFS EVERY 6 HOURS AS NEEDED  aspirin 325 MG EC tablet Take by mouth  oxyBUTYnin (DITROPAN-XL) 5 MG XL tablet Take by mouth     Family History  Problem Relation Age of Onset  Skin cancer Mother  High blood pressure (Hypertension) Mother  Hyperlipidemia (Elevated cholesterol) Mother  Heart valve disease Father  High blood pressure (Hypertension) Father  Hyperlipidemia (Elevated cholesterol) Father  Diabetes Father    Social History   Tobacco Use  Smoking Status Never  Smokeless Tobacco Never  Marital status: Married  Tobacco Use  Smoking status: Never  Smokeless tobacco: Never  Substance and Sexual Activity  Alcohol use: Never  Drug use: Never   Objective:  Vitals:  11/14/23 1534  PainSc: 0-No pain  PainLoc: Breast   Physical Exam   General nad Right sided port in place Left breast mass is less distinct, no ax adenopathy   Assessment and Plan:   Invasive ductal carcinoma of breast, left (CMS/HHS-HCC)  Left breast seed guided lumpectomy, left ax sn biopsy (I injected magtrace today), left ax node seed guided excision  She would like to just proceed with surgery. Discussed preference would be to continue systemic therapy.  We discussed a sentinel lymph node biopsy and seed guided node excision (so called TAD)> We discussed the performance of that with injection of Magtrace which I injected under sterile conditions today 1.5 cc in subareolar position. Discussed possibility of returning to OR for alnd. Lymphedema risk discussed.  We discussed the options for treatment of the breast cancer which included lumpectomy versus a mastectomy. We discussed the performance of the lumpectomy with radioactive seed placement. We discussed a 5-10% chance of a positive margin requiring reexcision in the operating room. We also discussed that she will likely need radiation therapy  if she undergoes lumpectomy. We discussed mastectomy and the postoperative care for that as well. Mastectomy can be followed by reconstruction. The decision for lumpectomy vs mastectomy has no impact on decision for chemotherapy.  Most mastectomy patients will not need radiation therapy. We discussed that there is no difference in her survival whether she undergoes lumpectomy with radiation therapy or antiestrogen therapy versus a mastectomy. There is also no real difference between her recurrence in the breast.  We discussed the risks of operation including bleeding, infection, possible reoperation. She understands her further therapy will be based on what her stages at the time of her operation.

## 2023-11-20 NOTE — Progress Notes (Signed)
 Assisted Dr. Tacy Dura with left, pectoralis, ultrasound guided block. Side rails up, monitors on throughout procedure. See vital signs in flow sheet. Tolerated Procedure well.

## 2023-11-21 ENCOUNTER — Other Ambulatory Visit: Payer: Self-pay

## 2023-11-21 ENCOUNTER — Encounter (HOSPITAL_BASED_OUTPATIENT_CLINIC_OR_DEPARTMENT_OTHER): Payer: Self-pay | Admitting: General Surgery

## 2023-11-21 DIAGNOSIS — C50912 Malignant neoplasm of unspecified site of left female breast: Secondary | ICD-10-CM | POA: Diagnosis not present

## 2023-11-21 MED ORDER — TRAMADOL HCL 50 MG PO TABS: 50.0000 mg | ORAL_TABLET | Freq: Four times a day (QID) | ORAL | 0 refills | Status: DC | PRN

## 2023-11-21 NOTE — Discharge Summary (Signed)
 Physician Discharge Summary  Patient ID: Rebecca Steele MRN: [id number] DOB/AGE: [birth year] 73 y.o.  Admit date: 11/20/2023 Discharge date: 11/21/2023  Admission Diagnoses: Left breast cancer  Discharge Diagnoses:  Principal Problem:   Breast cancer, left breast [facility])   Discharged Condition: good  Hospital Course: 61 yof s/p lumpectomy/sn biopsy doing well following am ready for dc. Has had issues with anesthesia in the past so stayed overnight.   Consults: None  Significant Diagnostic Studies: none  Treatments: surgery: left lumpectomy/sn biopsy  Discharge Exam: Blood pressure 117/66, pulse 81, temperature 98.1 F (36.7 C), resp. rate 18, height 5\' 5"  (1.651 m), weight 87.2 kg, SpO2 94%. No hematoms  Disposition: Discharge disposition: 01-Home or Self Care        Allergies as of 11/21/2023       Reactions   Gadolinium Derivatives Anaphylaxis, Shortness Of Breath   Pt was given 17ml multihance for MRI. After about 90min30 seconds she squeezed the ball saying she felt like her throat was closing up. Exam was DC'd and radiologist gave 75mg  benadryl IV.    Other    Pt states she was given Gabapentin and Fentanyl for anesthesia during recent surgery and was unable to wake up for several hours.  Pt states she does not want to have sedation with those medications in the future.   Valium Anaphylaxis   Paxlovid [nirmatrelvir-ritonavir] Hives   Doxycycline Swelling   Facial swelling Pt has since had without problem   Fentanyl Other (See Comments)   Slept the whole time for few days   Iohexol Itching    Desc: Pt. stated she had iv contrast around 25 yrs ago and had itching on her neck.  she took benadryl and it went away.  The rad recommended premeds and be done tomorrow but the pt.did not want to do that so we did her w/o iv contrast today., Onset Date: [id number]   Macrodantin Nausea And Vomiting   Red Dye #40 (allura Red) Swelling   Facial swelling (cannot take pink or  red tablets or capsules)   Rocephin [ceftriaxone] Other (See Comments)   Joint aches   Zithromax [azithromycin] Other (See Comments)   Weakness, cold/clammy, legs trembling        Medication List     TAKE these medications    butalbital-acetaminophen-caffeine 50-325-40 MG tablet Commonly known as: FIORICET Take 1 tablet by mouth 2 (two) times daily as needed for headache or migraine.   cholecalciferol 25 MCG (1000 UNIT) tablet Commonly known as: VITAMIN D3 Take 1,000 Units by mouth daily.   cholestyramine 4 g packet Commonly known as: Questran Take 1 packet (4 g total) by mouth 3 (three) times daily with meals.   dexamethasone 4 MG tablet Commonly known as: DECADRON Take 2 tabs by mouth 2 times daily starting day before chemo. Then take 2 tabs daily for 2 days starting day after chemo. Take with food.   doxycycline 100 MG tablet Commonly known as: VIBRA-TABS Take 1 tablet (100 mg total) by mouth 2 (two) times daily.   famotidine 20 MG tablet Commonly known as: PEPCID Take 1 tablet (20 mg total) by mouth 2 (two) times daily.   levothyroxine 25 MCG tablet Commonly known as: SYNTHROID Take 25 mcg by mouth daily before breakfast.   lidocaine 2 % solution Commonly known as: XYLOCAINE Use as directed 15 mLs in the mouth or throat as needed for mouth pain.   lidocaine-prilocaine cream Commonly known as: EMLA Apply to affected area once  loratadine 10 MG tablet Commonly known as: CLARITIN Take 10 mg by mouth daily.   montelukast 10 MG tablet Commonly known as: SINGULAIR Take 10 mg by mouth at bedtime.   mupirocin ointment 2 % Commonly known as: BACTROBAN Apply 1 Application topically 2 (two) times daily.   ondansetron 8 MG tablet Commonly known as: Zofran Take 1 tablet (8 mg total) by mouth every 8 (eight) hours as needed for nausea or vomiting. Start on the third day after chemotherapy.   prochlorperazine 10 MG tablet Commonly known as: COMPAZINE Take 1  tablet (10 mg total) by mouth every 6 (six) hours as needed for nausea or vomiting.   REPATHA [place] Inject into the skin.   sertraline 50 MG tablet Commonly known as: ZOLOFT Take 50 mg by mouth daily.   traMADol 50 MG tablet Commonly known as: ULTRAM Take 1 tablet (50 mg total) by mouth every 12 (twelve) hours as needed. What changed: Another medication with the same name was added. Make sure you understand how and when to take each.   traMADol 50 MG tablet Commonly known as: ULTRAM Take 1 tablet (50 mg total) by mouth every 6 (six) hours as needed (mild pain). What changed: You were already taking a medication with the same name, and this prescription was added. Make sure you understand how and when to take each.   traZODone 100 MG tablet Commonly known as: DESYREL Take 100 mg by mouth at bedtime.        Follow-up Information     [name], MD Follow up in 3 week(s).   Specialty: General Surgery Contact information: [address] [place] [address] [phone]                 Signed: [name] 11/21/2023, 7:25 AM

## 2023-11-21 NOTE — Discharge Instructions (Signed)
 [facility] Office Phone Number [phone]  POST OP INSTRUCTIONS Take 400 mg of ibuprofen every 8 hours or 650 mg tylenol every 6 hours for next 72 hours then as needed. Use ice several times daily also.  A prescription for pain medication may be given to you upon discharge.  Take your pain medication as prescribed, if needed.  If narcotic pain medicine is not needed, then you may take acetaminophen (Tylenol), naprosyn (Alleve) or ibuprofen (Advil) as needed. Take your usually prescribed medications unless otherwise directed If you need a refill on your pain medication, please contact your pharmacy.  They will contact our office to request authorization.  Prescriptions will not be filled after 5pm or on week-ends. You should eat very light the first 24 hours after surgery, such as soup, crackers, pudding, etc.  Resume your normal diet the day after surgery. Most patients will experience some swelling and bruising in the breast.  Ice packs and a good support bra will help.  Wear the breast binder provided or a sports bra for 72 hours day and night.  After that wear a sports bra during the day until you return to the office. Swelling and bruising can take several days to resolve.  It is common to experience some constipation if taking pain medication after surgery.  Increasing fluid intake and taking a stool softener will usually help or prevent this problem from occurring.  A mild laxative (Milk of Magnesia or Miralax) should be taken according to package directions if there are no bowel movements after 48 hours. I used skin glue on the incision, you may shower in 24 hours.  The glue will flake off over the next 2-3 weeks.  Any sutures or staples will be removed at the office during your follow-up visit. ACTIVITIES:  You may resume regular daily activities (gradually increasing) beginning the next day.  Wearing a good support bra or sports bra minimizes pain and swelling.  You may have  sexual intercourse when it is comfortable. You may drive when you no longer are taking prescription pain medication, you can comfortably wear a seatbelt, and you can safely maneuver your car and apply brakes. RETURN TO WORK:  ______________________________________________________________________________________ Rebecca Steele should see your doctor in the office for a follow-up appointment approximately two weeks after your surgery.  Your doctor's nurse will typically make your follow-up appointment when she calls you with your pathology report.  Expect your pathology report 3-4 business days after your surgery.  You may call to check if you do not hear from [place] after three days. OTHER INSTRUCTIONS: _______________________________________________________________________________________________ _____________________________________________________________________________________________________________________________________ _____________________________________________________________________________________________________________________________________ _____________________________________________________________________________________________________________________________________  WHEN TO CALL DR Alaija Ruble: Fever over 101.0 Nausea and/or vomiting. Extreme swelling or bruising. Continued bleeding from incision. Increased pain, redness, or drainage from the incision.  The clinic staff is available to answer your questions during regular business hours.  Please don't hesitate to call and ask to speak to one of the nurses for clinical concerns.  If you have a medical emergency, go to the nearest emergency room or call 911.  A surgeon from [facility] is always on call at the hospital.  For further questions, please visit [email or web address]

## 2023-11-22 LAB — SURGICAL PATHOLOGY

## 2023-11-23 ENCOUNTER — Encounter: Payer: Self-pay | Admitting: *Deleted

## 2023-11-23 ENCOUNTER — Other Ambulatory Visit: Payer: Self-pay | Admitting: Hematology and Oncology

## 2023-11-24 ENCOUNTER — Other Ambulatory Visit: Payer: Self-pay | Admitting: Hematology and Oncology

## 2023-11-28 ENCOUNTER — Other Ambulatory Visit: Payer: Self-pay | Admitting: General Surgery

## 2023-11-29 ENCOUNTER — Other Ambulatory Visit: Payer: Self-pay | Admitting: *Deleted

## 2023-11-29 ENCOUNTER — Telehealth: Payer: Self-pay | Admitting: *Deleted

## 2023-11-29 DIAGNOSIS — Z171 Estrogen receptor negative status [ER-]: Secondary | ICD-10-CM

## 2023-11-29 NOTE — Telephone Encounter (Signed)
 This RN canceled pt's chemo appts per noted in prior dictation that pt is proceeding to surgery.  This RN left a VM on pt's id'ed phone number to inquire if she wanted to keep MD appt scheduled tomorrow.  Noted pt's surgery is scheduled for 12/04/2023  Pt returned call and wanted to see MD to discuss plan post surgery for her understanding.

## 2023-11-29 NOTE — Assessment & Plan Note (Signed)
 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  Patient experienced severe side effects from the first round of chemotherapy, including mucositis, loss of appetite, and fatigue and decided to proceed with surgery. She is now here to discuss adjuvant recommendations. Unfortunately she has a large tumor , 2 LN with macromets, 1 with ITC and pos lateral margin, scheduled for re excision There has been a discussion if she would need ALND. We discussed that in pts with neoadj chemo, it is usually recommended to consider ALND with LN pos disease however she barely received one cycle of treatment, hence if she is willing to consider adj treatment, we can spare her of ALND. I briefly discussed with Dr Dwain Sarna as well.   Breast Cancer with Lymph Node Involvement Residual disease after one cycle of chemotherapy. Discussed the risks/benefits of switching to a less aggressive treatment, kadcyla, an antibody-drug conjugate. This treatment is more specific than systemic chemotherapy and is generally well-tolerated. The patient is scheduled for a total mastectomy due to unclear margins from the lumpectomy. -Start kadcyla on January 16, 2024, to be administered every 21 days for a year. -Order an echocardiogram before starting kadcyla -Refer to radiation oncology for consultation and planning of radiation therapy, which can be done concurrently with kadcyla Discussed side effects including fatigue, nausea, neuropathy etc.  Total Mastectomy Scheduled due to unclear margins from lumpectomy. Discussed the possibility of lymph node dissection, but decided against it due to potential complications and the patient's decision to proceed with Catxila. -Proceed with total mastectomy as  planned. -Follow-up a couple of weeks post-mastectomy.  Rachel Moulds MD

## 2023-11-29 NOTE — Progress Notes (Unsigned)
 [name]  Patient Care Team: Genelle Gather, MD as PCP - General (Internal Medicine) [name], MD as Consulting Physician (General Surgery) Harold Hedge, MD as Consulting Physician (Obstetrics and Gynecology) Dorena Cookey, MD (Inactive) as Consulting Physician (Gastroenterology) Rachel Moulds, MD as Consulting Physician (Hematology and Oncology) Pershing Proud, RN as Oncology Nurse Navigator Donnelly Angelica, RN as Oncology Nurse Navigator  CHIEF COMPLAINTS/PURPOSE OF CONSULTATION:  Newly diagnosed breast cancer  HISTORY OF PRESENTING ILLNESS:   Rebecca Steele 73 y.o. female is here because of recent diagnosis of left breast cancer  I reviewed her records extensively and collaborated the history with the patient.  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)  09/22/2023 Mammogram   Patient self palpated a breast mass in her left breast around November 2019 went for diagnostic mammogram, this showed there is a developing density on mammogram, 2 of the lower left axillary lymph nodes exhibit increased cortical thickness, ultrasound confirmed a 2.7 cm irregular hypoechoic mass in left breast at 12:00 5 cm from the nipple along with abnormal left axillary lymph   09/29/2023 Pathology Results   Left breast central superior needle core biopsy 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)   10/20/2023 - 10/23/2023 Chemotherapy   Patient is on Treatment Plan : BREAST  Docetaxel + Carboplatin + Trastuzumab + Pertuzumab  (TCHP) q21d      11/24/2023 Cancer Staging   Staging form: Breast, AJCC 8th Edition -  Pathologic stage from 11/24/2023: ypT2, pN1, cM0, G3, ER-, PR-, HER2+ - Signed by Rachel Moulds, MD on 11/24/2023 Stage prefix: Post-therapy Histologic grading system: 3 grade system   01/16/2024 -  Chemotherapy   Patient is on Treatment Plan : BREAST ADO-Trastuzumab Emtansine (Kadcyla) q21d       She had left breast lumpectomy which showed Invasive poorly differentiated adenocarcinoma, grade 3, focal high grade DCIS, solid type without necrosis. Tumor measures 3.6 cms in greatest dimension.Invasive tumor in lateral and posterior margins. Pos lateral margin, 2/4 LN with macromets, one with ITC. She is scheduled for re excision on 12/12/2023.  Discussed the use of AI scribe software for clinical note transcription with the patient, who gave verbal consent to proceed.  History of Present Illness    She is here for follow up with her husband.  Initially, she felt optimistic after the lumpectomy but was informed that further surgical intervention is required.  She previously underwent chemotherapy but had difficulty tolerating it, completing only one cycle.  She is recovering well from surgery, mastectomy is planned for December 12, 2023. She is yet to meet the radiation oncology folks.  Rest of the pertinent 10 point ROS reviewed and neg.  MEDICAL HISTORY:  Past Medical History:  Diagnosis Date   Breast mass, right    Complication of anesthesia    states she has had 2 neck surgeries and her neck is stiff, hard to wake   Depression    History of kidney stones    Hypertension    Hypothyroidism    Kidney stones    Mitral valve prolapse    Renal disease    Sleep apnea  HAS MILD OSA, NO CPAP NEEDED    SURGICAL HISTORY: Past Surgical History:  Procedure Laterality Date   APPENDECTOMY     BREAST BIOPSY Right 09/16/2022   MM RT BREAST BX W LOC DEV 1ST LESION IMAGE BX SPEC STEREO GUIDE 09/16/2022 [facility]   BREAST BIOPSY  12/06/2022   MM RT RADIOACTIVE SEED LOC MAMMO GUIDE  12/06/2022 [facility]   BREAST BIOPSY Left 11/16/2023   [place] LT RADIOACTIVE SEED LOC 11/16/2023 [facility]   BREAST BIOPSY  11/16/2023   MM LT RADIOACTIVE SEED EA ADD LESION LOC MAMMO GUIDE 11/16/2023 [facility]   BREAST EXCISIONAL BIOPSY Right 2018   BREAST LUMPECTOMY WITH RADIOACTIVE SEED AND SENTINEL LYMPH NODE BIOPSY Left 11/20/2023   Procedure: LEFT BREAST SEED GUIDED LUMPECTOMY, LEFT AXILLARY SENTINEL NODE BIOPSY;  Surgeon: [name], MD;  Location: [facility];  Service: General;  Laterality: Left;  PEC BLOCK   CHOLECYSTECTOMY     CYSTOSCOPY W/ RETROGRADES     KYPHOPLASTY N/A 10/05/2021   Procedure: LUMBAR ONE KYPHOPLASTY;  Surgeon: Jadene Pierini, MD;  Location: [facility];  Service: Neurosurgery;  Laterality: N/A;   NECK SURGERY     PORTACATH PLACEMENT Right 10/12/2023   Procedure: INSERTION PORT-A-CATH WITH GUIDED ULTRASOUND;  Surgeon: [name], MD;  Location: [facility];  Service: General;  Laterality: Right;   RADIOACTIVE SEED GUIDED AXILLARY SENTINEL LYMPH NODE Left 11/20/2023   Procedure: LEFT AXILLARY NODE SEED GUIDED EXCISION;  Surgeon: [name], MD;  Location: [facility];  Service: General;  Laterality: Left;   RADIOACTIVE SEED GUIDED EXCISIONAL BREAST BIOPSY Right 12/12/2017   Procedure: RIGHT RADIOACTIVE SEED GUIDED EXCISIONAL BREAST BIOPSY ERAS PATHWAY;  Surgeon: [name], MD;  Location: [facility];  Service: General;  Laterality: Right;  LMA   RADIOACTIVE SEED GUIDED EXCISIONAL BREAST BIOPSY Right 12/07/2022   Procedure: RADIOACTIVE SEED GUIDED EXCISIONAL RIGHT BREAST BIOPSY;  Surgeon: [name], MD;  Location: [facility];  Service: General;  Laterality: Right;   SHOULDER SURGERY     TONSILLECTOMY     TOTAL HIP ARTHROPLASTY Left 06/04/2021   Procedure: TOTAL HIP ARTHROPLASTY ANTERIOR APPROACH;  Surgeon: Jodi Geralds, MD;  Location: [facility];   Service: Orthopedics;  Laterality: Left;   TRIGGER FINGER RELEASE Right 05/18/2015   Procedure: RIGHT  LONG FINGER TRIGGER RELEASE ;  Surgeon: Mack Hook, MD;  Location: [facility];  Service: Orthopedics;  Laterality: Right;    SOCIAL HISTORY: Social History   Socioeconomic History   Marital status: Married    Spouse name: Not on file   Number of children: Not on file   Years of education: Not on file   Highest education level: Not on file  Occupational History   Not on file  Tobacco Use   Smoking status: Never   Smokeless tobacco: Never  Vaping Use   Vaping status: Never Used  Substance and Sexual Activity   Alcohol use: No   Drug use: No   Sexual activity: Not on file  Other Topics Concern   Not on file  Social History Narrative   Not on file   Social Drivers of Health   Financial Resource Strain: Low Risk  (04/24/2023)   Received from [facility]   Overall Financial Resource Strain (CARDIA)    Difficulty of Paying Living Expenses: Not hard at all  Food Insecurity: No Food Insecurity (10/20/2023)   Hunger Vital Sign    Worried About  Running Out of Food in the Last Year: Never true    Ran Out of Food in the Last Year: Never true  Transportation Needs: No Transportation Needs (10/20/2023)   PRAPARE - Administrator, Civil Service (Medical): No    Lack of Transportation (Non-Medical): No  Physical Activity: Inactive (04/24/2023)   Received [facility]   Exercise Vital Sign    Days of Exercise per Week: 0 days    Minutes of Exercise per Session: 10 min  Stress: No Stress Concern Present (04/24/2023)   Received from [facility] of Occupational [facility] - Occupational Stress Questionnaire    Feeling of Stress : Not at all  Social Connections: Socially Integrated (04/24/2023)   Received from [facility]   Social Network    How would you rate your social network (family, work, friends)?: Good participation with  social networks  Intimate Partner Violence: Not At Risk (04/24/2023)   Received from [facility]   HITS    Over the last 12 months how often did your partner physically hurt you?: Never    Over the last 12 months how often did your partner insult you or talk down to you?: Never    Over the last 12 months how often did your partner threaten you with physical harm?: Never    Over the last 12 months how often did your partner scream or curse at you?: Never    FAMILY HISTORY: Family History  Problem Relation Age of Onset   Breast cancer Maternal Aunt        76s    ALLERGIES:  is allergic to gadolinium derivatives, other, valium, paxlovid [nirmatrelvir-ritonavir], doxycycline, fentanyl, iohexol, macrodantin, red dye #40 (allura red), rocephin [ceftriaxone], and zithromax [azithromycin].  MEDICATIONS:  Current Outpatient Medications  Medication Sig Dispense Refill   butalbital-acetaminophen-caffeine (FIORICET) 50-325-40 MG tablet Take 1 tablet by mouth 2 (two) times daily as needed for headache or migraine.     cholecalciferol (VITAMIN D3) 25 MCG (1000 UNIT) tablet Take 1,000 Units by mouth daily.     cholestyramine (QUESTRAN) 4 g packet Take 1 packet (4 g total) by mouth 3 (three) times daily with meals. 60 each 0   doxycycline (VIBRA-TABS) 100 MG tablet Take 1 tablet (100 mg total) by mouth 2 (two) times daily. 14 tablet 0   Evolocumab (REPATHA [place]) Inject into the skin.     famotidine (PEPCID) 20 MG tablet Take 1 tablet (20 mg total) by mouth 2 (two) times daily. 60 tablet 0   levothyroxine (SYNTHROID) 25 MCG tablet Take 25 mcg by mouth daily before breakfast.     lidocaine (XYLOCAINE) 2 % solution Use as directed 15 mLs in the mouth or throat as needed for mouth pain. 480 mL 0   loratadine (CLARITIN) 10 MG tablet Take 10 mg by mouth daily.     montelukast (SINGULAIR) 10 MG tablet Take 10 mg by mouth at bedtime.     mupirocin ointment (BACTROBAN) 2 % Apply 1 Application topically 2 (two)  times daily. 22 g 0   sertraline (ZOLOFT) 50 MG tablet Take 50 mg by mouth daily.     traMADol (ULTRAM) 50 MG tablet Take 1 tablet (50 mg total) by mouth every 12 (twelve) hours as needed. 30 tablet 0   traMADol (ULTRAM) 50 MG tablet Take 1 tablet (50 mg total) by mouth every 6 (six) hours as needed (mild pain). 10 tablet 0   traZODone (DESYREL) 100 MG tablet  Take 100 mg by mouth at bedtime.     No current facility-administered medications for this visit.    REVIEW OF SYSTEMS:   Constitutional: Denies fevers, chills or abnormal night sweats Eyes: Denies blurriness of vision, double vision or watery eyes Ears, nose, mouth, throat, and face: Denies mucositis or sore throat Respiratory: Denies cough, dyspnea or wheezes Cardiovascular: Denies palpitation, chest discomfort or lower extremity swelling Gastrointestinal:  Denies nausea, heartburn or change in bowel habits Skin: Denies abnormal skin rashes Lymphatics: Denies new lymphadenopathy or easy bruising Neurological:Denies numbness, tingling or new weaknesses Behavioral/Psych: Mood is stable, no new changes  Breast: As mentioned above All other systems were reviewed with the patient and are negative.  PHYSICAL EXAMINATION: ECOG PERFORMANCE STATUS: 0 - Asymptomatic  Vitals:   11/30/23 1035  BP: 137/74  Pulse: 80  Resp: 17  Temp: (!) 97.2 F (36.2 C)  SpO2: 96%     Filed Weights   11/30/23 1035  Weight: 189 lb 12.8 oz (86.1 kg)    GENERAL appears well. No acute distress.   LABORATORY DATA:  I have reviewed the data as listed Lab Results  Component Value Date   WBC 9.5 11/02/2023   HGB 14.0 11/02/2023   HCT 41.5 11/02/2023   MCV 92.4 11/02/2023   PLT 127 (L) 11/02/2023   Lab Results  Component Value Date   NA 141 11/02/2023   K 3.8 11/02/2023   CL 110 11/02/2023   CO2 25 11/02/2023    RADIOGRAPHIC STUDIES: I have personally reviewed the radiological reports and agreed with the findings in the  report.  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  Patient experienced severe side effects from the first round of chemotherapy, including mucositis, loss of appetite, and fatigue and decided to proceed with surgery. She is now here to discuss adjuvant recommendations. Unfortunately she has a large tumor , 2 LN with macromets, 1 with ITC and pos lateral margin, scheduled for re excision There has been a discussion if she would need ALND. We discussed that in pts with neoadj chemo, it is usually recommended to consider ALND with LN pos disease however she barely received one cycle of treatment, hence if she is willing to consider adj treatment, we can spare her of ALND. I briefly discussed with Dr Dwain Sarna as well.   Breast Cancer with Lymph Node Involvement Residual disease after one cycle of chemotherapy. Discussed the risks/benefits of switching to a less aggressive treatment, kadcyla, an antibody-drug conjugate. This treatment is more specific than systemic chemotherapy and is generally well-tolerated. The patient is scheduled for a total mastectomy due to unclear margins from the lumpectomy. -Start kadcyla on January 16, 2024, to be administered every 21 days for a year. -Order an echocardiogram before starting kadcyla -Refer to radiation oncology for consultation and planning of radiation therapy, which can be done concurrently with kadcyla Discussed side effects including fatigue, nausea, neuropathy etc.  Total Mastectomy Scheduled due to unclear margins from lumpectomy. Discussed the possibility of lymph node dissection, but decided against it due to potential  complications and the patient's decision to proceed with Catxila. -Proceed with total mastectomy as planned. -Follow-up a couple of weeks post-mastectomy.  Rachel Moulds MD   Total time spent: 30 min including history, exam, discussion of options, review of records, counseling and coordination of care  All questions were answered. The patient knows to call the clinic with any problems, questions or concerns.    Rachel Moulds, MD 11/30/23

## 2023-11-30 ENCOUNTER — Inpatient Hospital Stay: Payer: PPO | Admitting: Dietician

## 2023-11-30 ENCOUNTER — Inpatient Hospital Stay: Payer: PPO | Attending: Hematology and Oncology | Admitting: Hematology and Oncology

## 2023-11-30 ENCOUNTER — Inpatient Hospital Stay: Payer: PPO

## 2023-11-30 ENCOUNTER — Encounter: Payer: Self-pay | Admitting: *Deleted

## 2023-11-30 VITALS — BP 137/74 | HR 80 | Temp 97.2°F | Resp 17 | Wt 189.8 lb

## 2023-11-30 DIAGNOSIS — Z1722 Progesterone receptor negative status: Secondary | ICD-10-CM | POA: Diagnosis not present

## 2023-11-30 DIAGNOSIS — Z171 Estrogen receptor negative status [ER-]: Secondary | ICD-10-CM | POA: Diagnosis not present

## 2023-11-30 DIAGNOSIS — C50212 Malignant neoplasm of upper-inner quadrant of left female breast: Secondary | ICD-10-CM | POA: Diagnosis present

## 2023-11-30 DIAGNOSIS — C773 Secondary and unspecified malignant neoplasm of axilla and upper limb lymph nodes: Secondary | ICD-10-CM | POA: Insufficient documentation

## 2023-11-30 DIAGNOSIS — Z1731 Human epidermal growth factor receptor 2 positive status: Secondary | ICD-10-CM | POA: Diagnosis not present

## 2023-11-30 NOTE — Progress Notes (Signed)
 DISCONTINUE ON PATHWAY REGIMEN - Breast  No Medical Intervention - Off Treatment.  PRIOR TREATMENT: [id number]: Referral to Surgery  START ON PATHWAY REGIMEN - Breast     A cycle is every 21 days:     Ado-trastuzumab emtansine   **Always confirm dose/schedule in your pharmacy ordering system**  Patient Characteristics: Post-Neoadjuvant Therapy and Resection, M0, HER2 Positive, ER Negative, Residual Disease, Adjuvant Targeted Therapy After Neoadjuvant Chemo/Targeted Therapy Therapeutic Status: Post-Neoadjuvant Therapy and Resection, M0 Residual Invasive Disease Post-Neoadjuvant Therapy<= Yes ER Status: Negative (-) HER2 Status: Positive (+) PR Status: Negative (-) Intent of Therapy: Curative Intent, Discussed with Patient

## 2023-11-30 NOTE — Progress Notes (Signed)
 DISCONTINUE ON PATHWAY REGIMEN - Breast     Cycle 1: A cycle is 21 days:     Pertuzumab      Trastuzumab-xxxx      Docetaxel      Carboplatin    Cycles 2 through 6: A cycle is every 21 days:     Pertuzumab      Trastuzumab-xxxx      Docetaxel      Carboplatin   **Always confirm dose/schedule in your pharmacy ordering system**  PRIOR TREATMENT: [id number]: Docetaxel + Carboplatin + Trastuzumab IV + Pertuzumab IV (TCHP IV) q21 Days x 6 Cycles  Breast - No Medical Intervention - Off Treatment.  Patient Characteristics: Preoperative or Nonsurgical Candidate, M0 (Clinical Staging), Up to cT4c, Any N, M0, Surgery followed by Adjuvant Therapy Therapeutic Status: Preoperative or Nonsurgical Candidate, M0 (Clinical Staging) AJCC M Category: cM0 AJCC Grade: G2 ER Status: Negative (-) AJCC 8 Stage Grouping: IIB HER2 Status: Positive (+) AJCC T Category: cT2 AJCC N Category: cN1 PR Status: Negative (-) Breast Surgical Plan: Surgery followed by Adjuvant Therapy (if necessary)

## 2023-12-01 ENCOUNTER — Other Ambulatory Visit: Payer: Self-pay

## 2023-12-02 ENCOUNTER — Ambulatory Visit: Payer: PPO

## 2023-12-04 NOTE — Progress Notes (Signed)
 New Breast Cancer Diagnosis: Left breast   Did patient present with symptoms (if so, please note symptoms) or screening mammography?:     Histology per Pathology Report: grade 3, Invasive Ductal Carcinoma 11/20/2023  Receptor Status: ER(negative), PR (negative), Her2-neu (positive), Ki-(%)   Surgeon and surgical plan, if any:  Dr. Dwain Sarna -Left breast seed guided lumpectomy, left axillary sentinel lymph node biopsy 11/20/2023 -Left Mastectomy 12/12/2023   Medical oncologist, treatment if any:   Dr. Al Pimple 11/30/2023 -Residual disease after one cycle of chemotherapy. Discussed the risks/benefits of switching to a less aggressive treatment, kadcyla, an antibody-drug conjugate. -The patient is scheduled for a total mastectomy due to unclear margins from the lumpectomy. -Start kadcyla on January 16, 2024, to be administered every 21 days for a year. -Refer to radiation oncology for consultation and planning of radiation therapy, which can be done concurrently with kadcyla   Family History of Breast/Ovarian/Prostate Cancer: Maternal Aunt had breast cancer.  Lymphedema issues, if any: She reports a little swelling in the breast.     Pain issues, if any: She reports a little discomfort in the surgical area.     SAFETY ISSUES: Prior radiation? No Pacemaker/ICD? No Possible current pregnancy? Postmenopausal Is the patient on methotrexate? No  Current Complaints / other details:

## 2023-12-04 NOTE — Progress Notes (Signed)
 Radiation Oncology         [phone] ________________________________  Name: Rebecca Steele        MRN: [id number]  Date of Service: 12/05/2023 DOB: 07/03/1951  [id number], Lynford Humphrey, MD  Rachel Moulds, MD     REFERRING PHYSICIAN: Rachel Moulds, MD   DIAGNOSIS: The encounter diagnosis was Malignant neoplasm of upper-inner quadrant of left breast in female, estrogen receptor negative (HCC).   HISTORY OF PRESENT ILLNESS: Rebecca Steele is a 73 y.o. female seen at the request of Dr. Al Pimple for a newly diagnosed left breast cancer.  The patient has noted a palpable mass in her left breast and a diagnostic mammography on 09/22/23 showed a mass in the left breast.  There were also 2 abnormal appearing left axillary lymph nodes and further diagnostic workup with ultrasound located the mass in the left breast at 12:00 5 cm from the nipple injuring 2.7 cm.  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  She relocated her care to the [facility], and underwent Port-A-Cath placement on 10/12/2023.  She began systemic chemotherapy on 10/20/2023 but significantly struggled with side effects related to this and decided to proceed with surgery.  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  The tumor measured 3.6 cm but this was present in the lateral and posterior margins, less than 1 mm from the medial margin less than 1 mm from the anterior margin, 2 mm from the inferior and 3 mm from the superior margin, margins were free however of DCIS, angiolymphatic invasion was noted and further posterior and lateral margins were noted to have carcinoma within them.  Out of the 4 lymph nodes examined, 2 nodes had metastases.  Rather than undergoing  reexcision of her margins, the patient is scheduled for a left mastectomy on 12/12/2023.  She met back with Dr. Al Pimple on 11/30/2023 and rather than going back to traditional chemotherapy, is pursuing Kadcyla.  She is seen to discuss postmastectomy radiation at the appropriate time.    PREVIOUS RADIATION THERAPY: {EXAM; YES/NO:19492::"No"}   PAST MEDICAL HISTORY:  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       PAST SURGICAL HISTORY: Past Surgical History:  Procedure Laterality Date   APPENDECTOMY     BREAST BIOPSY Right 09/16/2022   MM RT BREAST BX W LOC DEV 1ST LESION IMAGE BX SPEC STEREO GUIDE 09/16/2022 [facility]   BREAST BIOPSY  12/06/2022   MM RT RADIOACTIVE SEED LOC MAMMO GUIDE 12/06/2022 [facility]   BREAST BIOPSY Left 11/16/2023   [place] LT RADIOACTIVE SEED LOC 11/16/2023 [facility]   BREAST BIOPSY  11/16/2023   MM LT RADIOACTIVE SEED EA ADD LESION LOC MAMMO GUIDE 11/16/2023 [facility]   BREAST EXCISIONAL BIOPSY Right 2018   BREAST LUMPECTOMY WITH RADIOACTIVE SEED AND SENTINEL LYMPH NODE BIOPSY Left  11/20/2023   Procedure: LEFT BREAST SEED GUIDED LUMPECTOMY, LEFT AXILLARY SENTINEL NODE BIOPSY;  Surgeon: [name], MD;  Location: [facility];  Service: General;  Laterality: Left;  PEC BLOCK   CHOLECYSTECTOMY     CYSTOSCOPY W/ RETROGRADES     KYPHOPLASTY N/A 10/05/2021   Procedure: LUMBAR ONE KYPHOPLASTY;  Surgeon: Jadene Pierini, MD;  Location: [facility];  Service: Neurosurgery;  Laterality: N/A;   NECK SURGERY     PORTACATH PLACEMENT Right 10/12/2023   Procedure: INSERTION PORT-A-CATH WITH GUIDED ULTRASOUND;  Surgeon:  [name], MD;  Location: [facility];  Service: General;  Laterality: Right;   RADIOACTIVE SEED GUIDED AXILLARY SENTINEL LYMPH NODE Left 11/20/2023   Procedure: LEFT AXILLARY NODE SEED GUIDED EXCISION;  Surgeon: [name], MD;  Location: [facility];  Service: General;  Laterality: Left;   RADIOACTIVE SEED GUIDED EXCISIONAL BREAST BIOPSY Right 12/12/2017   Procedure: RIGHT RADIOACTIVE SEED GUIDED EXCISIONAL BREAST BIOPSY ERAS PATHWAY;  Surgeon: [name], MD;  Location: [facility];  Service: General;  Laterality: Right;  LMA   RADIOACTIVE SEED GUIDED EXCISIONAL BREAST BIOPSY Right 12/07/2022   Procedure: RADIOACTIVE SEED GUIDED EXCISIONAL RIGHT BREAST BIOPSY;  Surgeon: [name], MD;  Location: [facility];  Service: General;  Laterality: Right;   SHOULDER SURGERY     TONSILLECTOMY     TOTAL HIP ARTHROPLASTY Left 06/04/2021   Procedure: TOTAL HIP ARTHROPLASTY ANTERIOR APPROACH;  Surgeon: Jodi Geralds, MD;  Location: [facility];  Service: Orthopedics;  Laterality: Left;   TRIGGER FINGER RELEASE Right 05/18/2015   Procedure: RIGHT  LONG FINGER TRIGGER RELEASE ;  Surgeon: Mack Hook, MD;  Location: [facility];  Service: Orthopedics;  Laterality: Right;     FAMILY HISTORY:  Family History  Problem Relation Age of Onset   Breast cancer Maternal Aunt        4s     SOCIAL HISTORY:  reports that she has never smoked. She has never used smokeless tobacco. She reports that she does not drink alcohol and does not use drugs.  The patient is married and lives in [place].  She***   ALLERGIES: Gadolinium derivatives, Other, Valium, Paxlovid [nirmatrelvir-ritonavir], Doxycycline, Fentanyl, Iohexol, Macrodantin, Red dye #40 (allura red), Rocephin [ceftriaxone], and Zithromax [azithromycin]   MEDICATIONS:  Current Outpatient Medications  Medication Sig Dispense Refill    butalbital-acetaminophen-caffeine (FIORICET) 50-325-40 MG tablet Take 1 tablet by mouth 2 (two) times daily as needed for headache or migraine.     cholecalciferol (VITAMIN D3) 25 MCG (1000 UNIT) tablet Take 1,000 Units by mouth daily.     cholestyramine (QUESTRAN) 4 g packet Take 1 packet (4 g total) by mouth 3 (three) times daily with meals. 60 each 0   doxycycline (VIBRA-TABS) 100 MG tablet Take 1 tablet (100 mg total) by mouth 2 (two) times daily. 14 tablet 0   Evolocumab (REPATHA Ashley) Inject into the skin.     famotidine (PEPCID) 20 MG tablet Take 1 tablet (20 mg total) by mouth 2 (two) times daily. 60 tablet 0   levothyroxine (SYNTHROID) 25 MCG tablet Take 25 mcg by mouth daily before breakfast.     lidocaine (XYLOCAINE) 2 % solution Use as directed 15 mLs in the mouth or throat as needed for mouth pain. 480 mL 0   loratadine (CLARITIN) 10 MG tablet Take 10 mg by mouth daily.     montelukast (SINGULAIR) 10 MG tablet Take 10 mg by mouth  at bedtime.     mupirocin ointment (BACTROBAN) 2 % Apply 1 Application topically 2 (two) times daily. 22 g 0   sertraline (ZOLOFT) 50 MG tablet Take 50 mg by mouth daily.     traMADol (ULTRAM) 50 MG tablet Take 1 tablet (50 mg total) by mouth every 12 (twelve) hours as needed. 30 tablet 0   traMADol (ULTRAM) 50 MG tablet Take 1 tablet (50 mg total) by mouth every 6 (six) hours as needed (mild pain). 10 tablet 0   traZODone (DESYREL) 100 MG tablet Take 100 mg by mouth at bedtime.     No current facility-administered medications for this visit.     REVIEW OF SYSTEMS: On review of systems, the patient reports that she is doing ***     PHYSICAL EXAM:  Wt Readings from Last 3 Encounters:  11/30/23 189 lb 12.8 oz (86.1 kg)  11/20/23 192 lb 3.9 oz (87.2 kg)  11/02/23 189 lb 9.6 oz (86 kg)   Temp Readings from Last 3 Encounters:  11/30/23 (!) 97.2 F (36.2 C) (Temporal)  11/21/23 98.1 F (36.7 C)  11/02/23 98 F (36.7 C) (Temporal)   BP Readings from  Last 3 Encounters:  11/30/23 137/74  11/21/23 117/66  11/02/23 122/86   Pulse Readings from Last 3 Encounters:  11/30/23 80  11/21/23 81  11/02/23 78    In general this is a well appearing Caucasian female in no acute distress. She's alert and oriented x4 and appropriate throughout the examination. Cardiopulmonary assessment is negative for acute distress and she exhibits normal effort. Bilateral breast exam is deferred.    ECOG = ***  0 - Asymptomatic (Fully active, able to carry on all predisease activities without restriction)  1 - Symptomatic but completely ambulatory (Restricted in physically strenuous activity but ambulatory and able to carry out work of a light or sedentary nature. For example, light housework, office work)  2 - Symptomatic, <50% in bed during the day (Ambulatory and capable of all self care but unable to carry out any work activities. Up and about more than 50% of waking hours)  3 - Symptomatic, >50% in bed, but not bedbound (Capable of only limited self-care, confined to bed or chair 50% or more of waking hours)  4 - Bedbound (Completely disabled. Cannot carry on any self-care. Totally confined to bed or chair)  5 - Death   Santiago Glad MM, Creech RH, Tormey DC, et al. [phone]). "Toxicity and response criteria of the [facility]". Am. Evlyn Clines. Oncol. 5 (6): 649-55    LABORATORY DATA:  Lab Results  Component Value Date   WBC 9.5 11/02/2023   HGB 14.0 11/02/2023   HCT 41.5 11/02/2023   MCV 92.4 11/02/2023   PLT 127 (L) 11/02/2023   Lab Results  Component Value Date   NA 141 11/02/2023   K 3.8 11/02/2023   CL 110 11/02/2023   CO2 25 11/02/2023   Lab Results  Component Value Date   ALT 26 11/02/2023   AST 18 11/02/2023   ALKPHOS 96 11/02/2023   BILITOT 0.5 11/02/2023      RADIOGRAPHY: MM Breast Surgical Specimen Result Date: 11/20/2023 CLINICAL DATA:  Status post excision of the left breast and left axilla lesion following  radioactive seed localization. Assess surgical specimens. EXAM: SPECIMEN RADIOGRAPH OF THE LEFT BREAST AND AXILLA COMPARISON:  Previous exam(s). FINDINGS: Status post excision of the left breast and axilla. The post biopsy marker clip and 2 associated radioactive seeds are  present within the breast specimen. The vision shaped post biopsy marker clip and radioactive seed are present within the axillary lymph node specimen. IMPRESSION: Specimen radiographs of the left breast and left axilla. Electronically Signed   By: Amie Portland M.D.   On: 11/20/2023 10:06   MM Breast Surgical Specimen Result Date: 11/20/2023 CLINICAL DATA:  Status post excision of the left breast and left axilla lesion following radioactive seed localization. Assess surgical specimens. EXAM: SPECIMEN RADIOGRAPH OF THE LEFT BREAST AND AXILLA COMPARISON:  Previous exam(s). FINDINGS: Status post excision of the left breast and axilla. The post biopsy marker clip and 2 associated radioactive seeds are present within the breast specimen. The vision shaped post biopsy marker clip and radioactive seed are present within the axillary lymph node specimen. IMPRESSION: Specimen radiographs of the left breast and left axilla. Electronically Signed   By: Amie Portland M.D.   On: 11/20/2023 10:06   [place] LT RADIOACTIVE SEED LOC Result Date: 11/16/2023 CLINICAL DATA:  73 year old with biopsy-proven invasive ductal carcinoma involving the LEFT breast and fine needle aspiration biopsy-proven metastatic disease involving a LEFT axillary lymph node (biopsies performed at [facility]). Radioactive seed localization is performed in anticipation of lumpectomy and targeted node dissection. EXAM: ULTRASOUND GUIDED RADIOACTIVE SEED LOCALIZATION OF THE LEFT BREAST AND A LEFT AXILLARY LYMPH NODE RADIOACTIVE SEED LOCALIZATION OF THE LEFT BREAST USING MAMMOGRAPHIC GUIDANCE COMPARISON:  Previous exam(s). FINDINGS: Patient presents for radioactive seed localization prior  to LEFT breast lumpectomy and targeted LEFT axillary lymph node excision. I met with the patient and we discussed the procedure of seed localization including benefits and alternatives. We discussed the high likelihood of a successful procedure. We discussed the risks of the procedure including infection, bleeding, tissue injury and further surgery. We discussed the low dose of radioactivity involved in the procedure. Informed, written consent was given. The usual time-out protocol was performed immediately prior to the procedure. Initially, under direct ultrasound visualization, sterile technique with chlorhexidine as skin antisepsis, 1% lidocaine as local anesthetic, an I-125 radioactive seed was used to localize the mass in the upper LEFT breast using a lateral approach. Subsequently, under direct ultrasound visualization, sterile technique with chlorhexidine as skin antisepsis, 1% lidocaine as local anesthetic, an I-125 radioactive seed was used to localize the abnormal LEFT axillary lymph node using an inferolateral approach. The follow-up mammogram images confirm that the seed in the axilla is appropriately positioned within the previously biopsied lymph node. However, the seed in the breast is approximately 1.7 cm medial and 1.1 cm inferior to the stoplight shaped clip; therefore, a second seed was placed with mammographic guidance adjacent to the clip. The images are marked for Dr. Dwain Sarna. Follow-up survey of the patient confirms the presence of the radioactive seeds. LEFT breast mass, stoplight shaped clip-the seed that migrated medial to the clip: Order number of I-125 seed: [id number] Total activity: 0.259 mCi Reference Date: 10/12/2023 LEFT breast mass, stoplight shaped clip-the seed adjacent to the clip: Order number of I-125 seed: [id number] Total activity: 0.240 mCi Reference Date: 09/13/2023 LEFT axillary lymph node, Venus clip: Order number of I-125 seed: [id number] Total activity: 0.259 mCi  Reference Date: 10/12/2023 The patient tolerated the procedure well without apparent immediate complications. She was released from the [facility] with instructions regarding seed removal. IMPRESSION: 1. Radioactive seed localization of biopsy-proven invasive ductal carcinoma involving the upper LEFT breast. Two seeds were placed in the breast as the initial seed placed under ultrasound migrated approximately 1.7  cm medial and 1.1 cm inferior to the clip. 2. Radioactive seed localization of a biopsy-proven metastatic LEFT axillary lymph node. I sent Dr. Dwain Sarna an EPIC message regarding the above. Electronically Signed   By: Hulan Saas M.D.   On: 11/16/2023 15:48   MM LT RAD SEED EA ADD LESION LOC MAMMO Result Date: 11/16/2023 CLINICAL DATA:  73 year old with biopsy-proven invasive ductal carcinoma involving the LEFT breast and fine needle aspiration biopsy-proven metastatic disease involving a LEFT axillary lymph node (biopsies performed at [facility]). Radioactive seed localization is performed in anticipation of lumpectomy and targeted node dissection. EXAM: ULTRASOUND GUIDED RADIOACTIVE SEED LOCALIZATION OF THE LEFT BREAST AND A LEFT AXILLARY LYMPH NODE RADIOACTIVE SEED LOCALIZATION OF THE LEFT BREAST USING MAMMOGRAPHIC GUIDANCE COMPARISON:  Previous exam(s). FINDINGS: Patient presents for radioactive seed localization prior to LEFT breast lumpectomy and targeted LEFT axillary lymph node excision. I met with the patient and we discussed the procedure of seed localization including benefits and alternatives. We discussed the high likelihood of a successful procedure. We discussed the risks of the procedure including infection, bleeding, tissue injury and further surgery. We discussed the low dose of radioactivity involved in the procedure. Informed, written consent was given. The usual time-out protocol was performed immediately prior to the procedure. Initially, under  direct ultrasound visualization, sterile technique with chlorhexidine as skin antisepsis, 1% lidocaine as local anesthetic, an I-125 radioactive seed was used to localize the mass in the upper LEFT breast using a lateral approach. Subsequently, under direct ultrasound visualization, sterile technique with chlorhexidine as skin antisepsis, 1% lidocaine as local anesthetic, an I-125 radioactive seed was used to localize the abnormal LEFT axillary lymph node using an inferolateral approach. The follow-up mammogram images confirm that the seed in the axilla is appropriately positioned within the previously biopsied lymph node. However, the seed in the breast is approximately 1.7 cm medial and 1.1 cm inferior to the stoplight shaped clip; therefore, a second seed was placed with mammographic guidance adjacent to the clip. The images are marked for Dr. Dwain Sarna. Follow-up survey of the patient confirms the presence of the radioactive seeds. LEFT breast mass, stoplight shaped clip-the seed that migrated medial to the clip: Order number of I-125 seed: [id number] Total activity: 0.259 mCi Reference Date: 10/12/2023 LEFT breast mass, stoplight shaped clip-the seed adjacent to the clip: Order number of I-125 seed: [id number] Total activity: 0.240 mCi Reference Date: 09/13/2023 LEFT axillary lymph node, Venus clip: Order number of I-125 seed: [id number] Total activity: 0.259 mCi Reference Date: 10/12/2023 The patient tolerated the procedure well without apparent immediate complications. She was released from the [facility] with instructions regarding seed removal. IMPRESSION: 1. Radioactive seed localization of biopsy-proven invasive ductal carcinoma involving the upper LEFT breast. Two seeds were placed in the breast as the initial seed placed under ultrasound migrated approximately 1.7 cm medial and 1.1 cm inferior to the clip. 2. Radioactive seed localization of a biopsy-proven metastatic LEFT axillary  lymph node. I sent Dr. Dwain Sarna an EPIC message regarding the above. Electronically Signed   By: Hulan Saas M.D.   On: 11/16/2023 15:48       IMPRESSION/PLAN: 1. [id number], grade 3, HER2 amplified invasive ductal carcinoma of the left breast. Dr. Mitzi Hansen discusses the pathology findings and reviews the nature of node positive breast disease.  She struggled with neoadjuvant chemotherapy and proceeded with surgery, but will not be undergoing completion mastectomy next week.  Dr. Mitzi Hansen discusses the rationale for adjuvant radiation to  the chest wall and regional lymph nodes to reduce risks of local recurrence in her situation.  We discussed the risks, benefits, short, and long term effects of radiotherapy, as well as the curative intent, and the patient is interested in proceeding. Dr. Mitzi Hansen discusses the delivery and logistics of radiotherapy and anticipates a course of 6 1/2 weeks of radiotherapy to the left chest wall and regional lymph nodes with deep inspiration breath-hold technique. We will see her back a few weeks after surgery to discuss the simulation process and anticipate we starting radiotherapy about 4-6 weeks after surgery if she is ready to proceed based on healing.     In a visit lasting *** minutes, greater than 50% of the time was spent face to face reviewing her case, as well as in preparation of, discussing, and coordinating the patient's care.  The above documentation reflects my direct findings during this shared patient visit. Please see the separate note by Dr. Mitzi Hansen on this date for the remainder of the patient's plan of care.    Osker Mason, [facility]    **Disclaimer: This note was dictated with voice recognition software. Similar sounding words can inadvertently be transcribed and this note may contain transcription errors which may not have been corrected upon publication of note.**

## 2023-12-05 ENCOUNTER — Encounter: Payer: Self-pay | Admitting: Radiation Oncology

## 2023-12-05 ENCOUNTER — Ambulatory Visit
Admission: RE | Admit: 2023-12-05 | Discharge: 2023-12-05 | Disposition: A | Discharge status: Home / Self Care | Payer: HMO | Source: Ambulatory Visit | Attending: Radiation Oncology | Admitting: Radiation Oncology

## 2023-12-05 VITALS — BP 138/85 | HR 73 | Temp 97.1°F | Resp 18 | Ht 65.0 in | Wt 189.1 lb

## 2023-12-05 DIAGNOSIS — Z79899 Other long term (current) drug therapy: Secondary | ICD-10-CM | POA: Insufficient documentation

## 2023-12-05 DIAGNOSIS — Z171 Estrogen receptor negative status [ER-]: Secondary | ICD-10-CM | POA: Diagnosis not present

## 2023-12-05 DIAGNOSIS — G4730 Sleep apnea, unspecified: Secondary | ICD-10-CM | POA: Diagnosis not present

## 2023-12-05 DIAGNOSIS — I341 Nonrheumatic mitral (valve) prolapse: Secondary | ICD-10-CM | POA: Insufficient documentation

## 2023-12-05 DIAGNOSIS — C50212 Malignant neoplasm of upper-inner quadrant of left female breast: Secondary | ICD-10-CM

## 2023-12-05 DIAGNOSIS — C773 Secondary and unspecified malignant neoplasm of axilla and upper limb lymph nodes: Secondary | ICD-10-CM | POA: Insufficient documentation

## 2023-12-05 DIAGNOSIS — I10 Essential (primary) hypertension: Secondary | ICD-10-CM | POA: Insufficient documentation

## 2023-12-05 DIAGNOSIS — Z87442 Personal history of urinary calculi: Secondary | ICD-10-CM | POA: Insufficient documentation

## 2023-12-05 DIAGNOSIS — E039 Hypothyroidism, unspecified: Secondary | ICD-10-CM | POA: Diagnosis not present

## 2023-12-05 DIAGNOSIS — Z79890 Hormone replacement therapy (postmenopausal): Secondary | ICD-10-CM | POA: Insufficient documentation

## 2023-12-06 ENCOUNTER — Encounter (HOSPITAL_BASED_OUTPATIENT_CLINIC_OR_DEPARTMENT_OTHER): Payer: Self-pay | Admitting: General Surgery

## 2023-12-06 ENCOUNTER — Other Ambulatory Visit: Payer: Self-pay

## 2023-12-08 MED ORDER — CHLORHEXIDINE GLUCONATE CLOTH 2 % EX PADS: 6.0000 | MEDICATED_PAD | Freq: Once | CUTANEOUS | Status: DC

## 2023-12-08 NOTE — Progress Notes (Signed)
 Surgical soap and pre-surgical drink given to patient, instructions given, patient verbalized understanding.

## 2023-12-11 NOTE — Anesthesia Preprocedure Evaluation (Signed)
 Anesthesia Evaluation  Patient identified by MRN, date of birth, ID band Patient awake    Reviewed: Allergy & Precautions, NPO status , Patient's Chart, lab work & pertinent test results  History of Anesthesia Complications (+) PROLONGED EMERGENCE and history of anesthetic complications  Airway Mallampati: III  TM Distance: >3 FB Neck ROM: Full    Dental  (+) Teeth Intact, Dental Advisory Given   Pulmonary sleep apnea (mild OSA, was not given cpap)    Pulmonary exam normal breath sounds clear to auscultation       Cardiovascular Normal cardiovascular exam+ Valvular Problems/Murmurs MVP  Rhythm:Regular Rate:Normal     Neuro/Psych  PSYCHIATRIC DISORDERS  Depression    negative neurological ROS     GI/Hepatic negative GI ROS, Neg liver ROS,,,  Endo/Other  Hypothyroidism    Renal/GU negative Renal ROS  negative genitourinary   Musculoskeletal  (+) Arthritis , Osteoarthritis,    Abdominal  (+) + obese  Peds  Hematology negative hematology ROS (+)   Anesthesia Other Findings   Reproductive/Obstetrics negative OB ROS                             Anesthesia Physical Anesthesia Plan  ASA: 2  Anesthesia Plan: General and Regional   Post-op Pain Management: Tylenol PO (pre-op)* and Regional block*   Induction: Intravenous  PONV Risk Score and Plan: 3 and Ondansetron, Dexamethasone, Midazolam and Treatment may vary due to age or medical condition  Airway Management Planned: LMA  Additional Equipment: None  Intra-op Plan:   Post-operative Plan: Extubation in OR  Informed Consent: I have reviewed the patients History and Physical, chart, labs and discussed the procedure including the risks, benefits and alternatives for the proposed anesthesia with the patient or authorized representative who has indicated his/her understanding and acceptance.     Dental advisory given  Plan Discussed  with: CRNA  Anesthesia Plan Comments:        Anesthesia Quick Evaluation

## 2023-12-12 ENCOUNTER — Ambulatory Visit (HOSPITAL_BASED_OUTPATIENT_CLINIC_OR_DEPARTMENT_OTHER): Payer: Self-pay | Admitting: Anesthesiology

## 2023-12-12 ENCOUNTER — Encounter (HOSPITAL_BASED_OUTPATIENT_CLINIC_OR_DEPARTMENT_OTHER)
Admission: RE | Disposition: A | Discharge status: Home / Self Care | Payer: Self-pay | Source: Home / Self Care | Attending: General Surgery

## 2023-12-12 ENCOUNTER — Observation Stay (HOSPITAL_BASED_OUTPATIENT_CLINIC_OR_DEPARTMENT_OTHER)
Admission: RE | Admit: 2023-12-12 | Discharge: 2023-12-13 | Disposition: A | Discharge status: Home / Self Care | Payer: HMO | Attending: General Surgery | Admitting: General Surgery

## 2023-12-12 ENCOUNTER — Other Ambulatory Visit: Payer: Self-pay

## 2023-12-12 ENCOUNTER — Encounter (HOSPITAL_BASED_OUTPATIENT_CLINIC_OR_DEPARTMENT_OTHER): Payer: Self-pay | Admitting: General Surgery

## 2023-12-12 DIAGNOSIS — Z96649 Presence of unspecified artificial hip joint: Secondary | ICD-10-CM | POA: Diagnosis not present

## 2023-12-12 DIAGNOSIS — D0512 Intraductal carcinoma in situ of left breast: Secondary | ICD-10-CM | POA: Diagnosis present

## 2023-12-12 DIAGNOSIS — Z9012 Acquired absence of left breast and nipple: Secondary | ICD-10-CM

## 2023-12-12 DIAGNOSIS — C50912 Malignant neoplasm of unspecified site of left female breast: Secondary | ICD-10-CM | POA: Diagnosis not present

## 2023-12-12 DIAGNOSIS — I10 Essential (primary) hypertension: Secondary | ICD-10-CM | POA: Insufficient documentation

## 2023-12-12 DIAGNOSIS — Z01818 Encounter for other preprocedural examination: Principal | ICD-10-CM

## 2023-12-12 DIAGNOSIS — Z79899 Other long term (current) drug therapy: Secondary | ICD-10-CM | POA: Insufficient documentation

## 2023-12-12 HISTORY — PX: SIMPLE MASTECTOMY WITH AXILLARY SENTINEL NODE BIOPSY: SHX6098

## 2023-12-12 SURGERY — SIMPLE MASTECTOMY
Anesthesia: Regional | Site: Breast | Laterality: Left

## 2023-12-12 MED ORDER — ONDANSETRON HCL 4 MG/2ML IJ SOLN: 4.0000 mg | Freq: Four times a day (QID) | INTRAMUSCULAR | Status: DC | PRN

## 2023-12-12 MED ORDER — SERTRALINE HCL 50 MG PO TABS
50.0000 mg | ORAL_TABLET | Freq: Every day | ORAL | Status: DC
  Filled 2023-12-12: qty 1

## 2023-12-12 MED ORDER — CEFAZOLIN SODIUM-DEXTROSE 2-4 GM/100ML-% IV SOLN
INTRAVENOUS | Status: AC
  Filled 2023-12-12: qty 100

## 2023-12-12 MED ORDER — PROPOFOL 10 MG/ML IV BOLUS
INTRAVENOUS | Status: AC
  Filled 2023-12-12: qty 20

## 2023-12-12 MED ORDER — OXYCODONE HCL 5 MG/5ML PO SOLN: 5.0000 mg | Freq: Once | ORAL | Status: AC | PRN

## 2023-12-12 MED ORDER — MONTELUKAST SODIUM 10 MG PO TABS
10.0000 mg | ORAL_TABLET | Freq: Every day | ORAL | Status: DC
Start: 2023-12-12 — End: 2023-12-13
  Administered 2023-12-12: 10 mg via ORAL
  Filled 2023-12-12: qty 1

## 2023-12-12 MED ORDER — BUPIVACAINE LIPOSOME 1.3 % IJ SUSP
INTRAMUSCULAR | Status: DC | PRN
  Administered 2023-12-12: 10 mL via PERINEURAL

## 2023-12-12 MED ORDER — METHOCARBAMOL 1000 MG/10ML IJ SOLN: 500.0000 mg | Freq: Three times a day (TID) | INTRAMUSCULAR | Status: DC | PRN

## 2023-12-12 MED ORDER — ONDANSETRON HCL 4 MG/2ML IJ SOLN: 4.0000 mg | Freq: Once | INTRAMUSCULAR | Status: DC | PRN

## 2023-12-12 MED ORDER — ACETAMINOPHEN 500 MG PO TABS: 1000.0000 mg | ORAL_TABLET | Freq: Once | ORAL | Status: DC

## 2023-12-12 MED ORDER — PHENYLEPHRINE HCL (PRESSORS) 10 MG/ML IV SOLN
INTRAVENOUS | Status: DC | PRN
Start: 2023-12-12 — End: 2023-12-12
  Administered 2023-12-12 (×3): 80 ug via INTRAVENOUS

## 2023-12-12 MED ORDER — METHOCARBAMOL 500 MG PO TABS
500.0000 mg | ORAL_TABLET | Freq: Three times a day (TID) | ORAL | Status: DC | PRN
  Administered 2023-12-12 (×2): 500 mg via ORAL
  Filled 2023-12-12 (×2): qty 1

## 2023-12-12 MED ORDER — AMISULPRIDE (ANTIEMETIC) 5 MG/2ML IV SOLN: 10.0000 mg | Freq: Once | INTRAVENOUS | Status: DC | PRN

## 2023-12-12 MED ORDER — PROPOFOL 500 MG/50ML IV EMUL
INTRAVENOUS | Status: DC | PRN
  Administered 2023-12-12: 200 ug/kg/min via INTRAVENOUS

## 2023-12-12 MED ORDER — ACETAMINOPHEN 500 MG PO TABS
1000.0000 mg | ORAL_TABLET | ORAL | Status: AC
  Administered 2023-12-12: 1000 mg via ORAL

## 2023-12-12 MED ORDER — DEXAMETHASONE SODIUM PHOSPHATE 10 MG/ML IJ SOLN
INTRAMUSCULAR | Status: AC
  Filled 2023-12-12: qty 1

## 2023-12-12 MED ORDER — FENTANYL CITRATE (PF) 100 MCG/2ML IJ SOLN
INTRAMUSCULAR | Status: AC
  Filled 2023-12-12: qty 2

## 2023-12-12 MED ORDER — ENSURE PRE-SURGERY PO LIQD: 296.0000 mL | Freq: Once | ORAL | Status: DC

## 2023-12-12 MED ORDER — FENTANYL CITRATE (PF) 100 MCG/2ML IJ SOLN
INTRAMUSCULAR | Status: DC | PRN
  Administered 2023-12-12 (×2): 25 ug via INTRAVENOUS
  Administered 2023-12-12: 50 ug via INTRAVENOUS

## 2023-12-12 MED ORDER — CEFAZOLIN SODIUM-DEXTROSE 2-4 GM/100ML-% IV SOLN
2.0000 g | INTRAVENOUS | Status: AC
  Administered 2023-12-12: 2 g via INTRAVENOUS

## 2023-12-12 MED ORDER — SODIUM CHLORIDE 0.9 % IV SOLN: INTRAVENOUS | Status: DC

## 2023-12-12 MED ORDER — LORATADINE 10 MG PO TABS
10.0000 mg | ORAL_TABLET | Freq: Every day | ORAL | Status: DC
  Filled 2023-12-12: qty 1

## 2023-12-12 MED ORDER — PROPOFOL 10 MG/ML IV BOLUS
INTRAVENOUS | Status: DC | PRN
  Administered 2023-12-12: 200 mg via INTRAVENOUS
  Administered 2023-12-12: 40 mg via INTRAVENOUS
  Administered 2023-12-12: 50 mg via INTRAVENOUS
  Administered 2023-12-12: 40 mg via INTRAVENOUS

## 2023-12-12 MED ORDER — HYDROMORPHONE HCL 1 MG/ML IJ SOLN
0.2500 mg | INTRAMUSCULAR | Status: DC | PRN
  Administered 2023-12-12: 0.25 mg via INTRAVENOUS

## 2023-12-12 MED ORDER — ROPIVACAINE HCL 5 MG/ML IJ SOLN
INTRAMUSCULAR | Status: DC | PRN
  Administered 2023-12-12: 20 mL via PERINEURAL

## 2023-12-12 MED ORDER — BUPIVACAINE LIPOSOME 1.3 % IJ SUSP
INTRAMUSCULAR | Status: AC
Start: 2023-12-12 — End: ?
  Filled 2023-12-12: qty 10

## 2023-12-12 MED ORDER — TRAZODONE HCL 100 MG PO TABS
100.0000 mg | ORAL_TABLET | Freq: Every day | ORAL | Status: DC
  Administered 2023-12-12: 100 mg via ORAL
  Filled 2023-12-12: qty 1

## 2023-12-12 MED ORDER — HYDROMORPHONE HCL 1 MG/ML IJ SOLN
INTRAMUSCULAR | Status: AC
  Filled 2023-12-12: qty 0.5

## 2023-12-12 MED ORDER — LIDOCAINE HCL (CARDIAC) PF 100 MG/5ML IV SOSY
PREFILLED_SYRINGE | INTRAVENOUS | Status: DC | PRN
  Administered 2023-12-12: 40 mg via INTRAVENOUS

## 2023-12-12 MED ORDER — ONDANSETRON HCL 4 MG/2ML IJ SOLN
INTRAMUSCULAR | Status: AC
  Filled 2023-12-12: qty 2

## 2023-12-12 MED ORDER — ACETAMINOPHEN 500 MG PO TABS
ORAL_TABLET | ORAL | Status: AC
  Filled 2023-12-12: qty 2

## 2023-12-12 MED ORDER — ONDANSETRON 4 MG PO TBDP: 4.0000 mg | ORAL_TABLET | Freq: Four times a day (QID) | ORAL | Status: DC | PRN

## 2023-12-12 MED ORDER — TRAMADOL HCL 50 MG PO TABS
50.0000 mg | ORAL_TABLET | Freq: Four times a day (QID) | ORAL | Status: DC | PRN
  Administered 2023-12-12 – 2023-12-13 (×3): 50 mg via ORAL
  Filled 2023-12-12 (×3): qty 1

## 2023-12-12 MED ORDER — MIDAZOLAM HCL 2 MG/2ML IJ SOLN
2.0000 mg | Freq: Once | INTRAMUSCULAR | Status: AC
  Administered 2023-12-12: 2 mg via INTRAVENOUS

## 2023-12-12 MED ORDER — TRANEXAMIC ACID 1000 MG/10ML IV SOLN
Status: DC | PRN
  Administered 2023-12-12: 3000 mg via TOPICAL

## 2023-12-12 MED ORDER — LIDOCAINE 2% (20 MG/ML) 5 ML SYRINGE
INTRAMUSCULAR | Status: AC
  Filled 2023-12-12: qty 5

## 2023-12-12 MED ORDER — ONDANSETRON HCL 4 MG/2ML IJ SOLN
INTRAMUSCULAR | Status: DC | PRN
  Administered 2023-12-12: 4 mg via INTRAVENOUS

## 2023-12-12 MED ORDER — BUTALBITAL-APAP-CAFFEINE 50-325-40 MG PO TABS: 1.0000 | ORAL_TABLET | Freq: Two times a day (BID) | ORAL | Status: DC | PRN

## 2023-12-12 MED ORDER — OXYCODONE HCL 5 MG PO TABS
5.0000 mg | ORAL_TABLET | Freq: Once | ORAL | Status: AC | PRN
  Administered 2023-12-12: 5 mg via ORAL
  Filled 2023-12-12: qty 1

## 2023-12-12 MED ORDER — ACETAMINOPHEN 500 MG PO TABS
1000.0000 mg | ORAL_TABLET | Freq: Four times a day (QID) | ORAL | Status: DC
  Administered 2023-12-12 – 2023-12-13 (×3): 1000 mg via ORAL
  Filled 2023-12-12 (×3): qty 2

## 2023-12-12 MED ORDER — LEVOTHYROXINE SODIUM 25 MCG PO TABS
25.0000 ug | ORAL_TABLET | Freq: Every day | ORAL | Status: DC
  Filled 2023-12-12: qty 1

## 2023-12-12 MED ORDER — MIDAZOLAM HCL 2 MG/2ML IJ SOLN
INTRAMUSCULAR | Status: AC
  Filled 2023-12-12: qty 2

## 2023-12-12 MED ORDER — DEXAMETHASONE SODIUM PHOSPHATE 4 MG/ML IJ SOLN
INTRAMUSCULAR | Status: DC | PRN
  Administered 2023-12-12: 4 mg via INTRAVENOUS

## 2023-12-12 MED ORDER — LACTATED RINGERS IV SOLN: INTRAVENOUS | Status: DC

## 2023-12-12 MED ORDER — TRANEXAMIC ACID 1000 MG/10ML IV SOLN
INTRAVENOUS | Status: AC
  Filled 2023-12-12: qty 30

## 2023-12-12 MED ORDER — SIMETHICONE 80 MG PO CHEW: 40.0000 mg | CHEWABLE_TABLET | Freq: Four times a day (QID) | ORAL | Status: DC | PRN

## 2023-12-12 SURGICAL SUPPLY — 58 items
APPLIER CLIP 11 MED OPEN (CLIP) IMPLANT
APPLIER CLIP 9.375 MED OPEN (MISCELLANEOUS) ×1 IMPLANT
BINDER BREAST LRG (GAUZE/BANDAGES/DRESSINGS) IMPLANT
BINDER BREAST MEDIUM (GAUZE/BANDAGES/DRESSINGS) IMPLANT
BINDER BREAST XLRG (GAUZE/BANDAGES/DRESSINGS) IMPLANT
BINDER BREAST XXLRG (GAUZE/BANDAGES/DRESSINGS) IMPLANT
BIOPATCH RED 1 DISK 7.0 (GAUZE/BANDAGES/DRESSINGS) IMPLANT
BLADE CLIPPER SURG (BLADE) IMPLANT
BLADE SURG 10 STRL SS (BLADE) ×1 IMPLANT
BLADE SURG 15 STRL LF DISP TIS (BLADE) ×1 IMPLANT
CANISTER SUCT 1200ML W/VALVE (MISCELLANEOUS) ×1 IMPLANT
CHLORAPREP W/TINT 26 (MISCELLANEOUS) ×1 IMPLANT
CLIP APPLIE 11 MED OPEN (CLIP) IMPLANT
CLIP APPLIE 9.375 MED OPEN (MISCELLANEOUS) IMPLANT
CLIP TI WIDE RED SMALL 6 (CLIP) IMPLANT
CLSR STERI-STRIP ANTIMIC 1/2X4 (GAUZE/BANDAGES/DRESSINGS) IMPLANT
COVER BACK TABLE 60X90IN (DRAPES) ×1 IMPLANT
COVER MAYO STAND STRL (DRAPES) ×1 IMPLANT
DERMABOND ADVANCED .7 DNX12 (GAUZE/BANDAGES/DRESSINGS) ×1 IMPLANT
DRAIN CHANNEL 19F RND (DRAIN) ×1 IMPLANT
DRAPE LAPAROSCOPIC ABDOMINAL (DRAPES) IMPLANT
DRAPE U-SHAPE 76X120 STRL (DRAPES) IMPLANT
DRAPE UTILITY XL STRL (DRAPES) ×1 IMPLANT
DRSG TEGADERM 4X4.75 (GAUZE/BANDAGES/DRESSINGS) IMPLANT
ELECT BLADE 4.0 EZ CLEAN MEGAD (MISCELLANEOUS) IMPLANT
ELECT REM PT RETURN 9FT ADLT (ELECTROSURGICAL) ×1 IMPLANT
ELECTRODE BLDE 4.0 EZ CLN MEGD (MISCELLANEOUS) IMPLANT
ELECTRODE REM PT RTRN 9FT ADLT (ELECTROSURGICAL) ×1 IMPLANT
EVACUATOR SILICONE 100CC (DRAIN) ×1 IMPLANT
GAUZE PAD ABD 8X10 STRL (GAUZE/BANDAGES/DRESSINGS) ×1 IMPLANT
GAUZE SPONGE 4X4 12PLY STRL (GAUZE/BANDAGES/DRESSINGS) ×1 IMPLANT
GLOVE BIO SURGEON STRL SZ7 (GLOVE) ×1 IMPLANT
GLOVE BIOGEL PI IND STRL 7.5 (GLOVE) ×1 IMPLANT
GOWN STRL REUS W/ TWL LRG LVL3 (GOWN DISPOSABLE) ×3 IMPLANT
HEMOSTAT ARISTA ABSORB 3G PWDR (HEMOSTASIS) IMPLANT
LIGHT WAVEGUIDE WIDE FLAT (MISCELLANEOUS) IMPLANT
NDL HYPO 25X1 1.5 SAFETY (NEEDLE) IMPLANT
NEEDLE HYPO 25X1 1.5 SAFETY (NEEDLE) IMPLANT
NS IRRIG 1000ML POUR BTL (IV SOLUTION) ×1 IMPLANT
PACK BASIN DAY SURGERY FS (CUSTOM PROCEDURE TRAY) ×1 IMPLANT
PENCIL SMOKE EVACUATOR (MISCELLANEOUS) ×1 IMPLANT
PIN SAFETY STERILE (MISCELLANEOUS) ×1 IMPLANT
RETRACTOR ONETRAX LX 90X20 (MISCELLANEOUS) IMPLANT
SLEEVE SCD COMPRESS KNEE MED (STOCKING) ×1 IMPLANT
SPIKE FLUID TRANSFER (MISCELLANEOUS) IMPLANT
SPONGE T-LAP 18X18 ~~LOC~~+RFID (SPONGE) ×1 IMPLANT
SPONGE T-LAP 4X18 ~~LOC~~+RFID (SPONGE) IMPLANT
STRIP CLOSURE SKIN 1/2X4 (GAUZE/BANDAGES/DRESSINGS) IMPLANT
SUT ETHILON 2 0 FS 18 (SUTURE) ×1 IMPLANT
SUT MNCRL AB 4-0 PS2 18 (SUTURE) IMPLANT
SUT MON AB 5-0 PS2 18 (SUTURE) IMPLANT
SUT SILK 2 0 SH (SUTURE) ×1 IMPLANT
SUT VIC AB 3-0 SH 27X BRD (SUTURE) IMPLANT
SUT VICRYL 3-0 CR8 SH (SUTURE) ×1 IMPLANT
SYR CONTROL 10ML LL (SYRINGE) IMPLANT
TOWEL GREEN STERILE FF (TOWEL DISPOSABLE) ×1 IMPLANT
TUBE CONNECTING 20X1/4 (TUBING) ×1 IMPLANT
YANKAUER SUCT BULB TIP NO VENT (SUCTIONS) ×1 IMPLANT

## 2023-12-12 NOTE — Discharge Instructions (Signed)
 [facility], [place] [phone]  MASTECTOMY: POST OP INSTRUCTIONS Take 400 mg of ibuprofen every 8 hours or 650 mg tylenol every 6 hours for next 72 hours then as needed. Use ice several times daily also.   A prescription for pain medication may be given to you upon discharge.  Take your pain medication as prescribed, if needed.  If narcotic pain medicine is not needed, then you may take acetaminophen (Tylenol), naprosyn (Alleve) or ibuprofen (Advil) as needed. Take your usually prescribed medications unless otherwise directed. If you need a refill on your pain medication, please contact your pharmacy.  They will contact our office to request authorization.  Prescriptions will not be filled after 5pm or on week-ends. You should follow a light diet the first 24 hours after surgery.  Resume your normal diet the day after surgery. Most patients will experience some swelling and bruising on the chest and underarm.  Ice packs will help.  Swelling and bruising can take several days to resolve. Wear the bra for 72 hours day and night. After that please wear during the day.  It is common to experience some constipation if taking pain medication after surgery.  Increasing fluid intake and taking a stool softener (such as Colace) will usually help or prevent this problem from occurring.  A mild laxative (Milk of Magnesia or Miralax) should be taken according to package instructions if there are no bowel movements after 48 hours. There is glue and steristrips on your incision. They will come off in the next few weeks.  You may take a shower 48 hours after surgery.  Any sutures will be removed at an office visit DRAINS:  If you have drains in place, it is important to keep a list of the amount of drainage produced each day in your drains.  Before leaving the hospital, you should be instructed on drain care.  Call our office if you have any questions about your drains. I will remove your drains when  they put out less than 30 cc or ml for 2 consecutive days. ACTIVITIES:  You may resume regular (light) daily activities beginning the next day--such as daily self-care, walking, climbing stairs--gradually increasing activities as tolerated.  You may have sexual intercourse when it is comfortable.  Refrain from any heavy lifting or straining until approved by your doctor. You may drive when you are no longer taking prescription pain medication, you can comfortably wear a seatbelt, and you can safely maneuver your car and apply brakes. RETURN TO WORK:  __________________________________________________________ Rebecca Steele should see your doctor in the office for a follow-up appointment approximately 3-5 days after your surgery.  Your doctor's nurse will typically make your follow-up appointment when she calls you with your pathology report.  Expect your pathology report 3-4business days after surgery. OTHER INSTRUCTIONS: ______________________________________________________________________________________________ ____________________________________________________________________________________________ WHEN TO CALL YOUR DR Kalima Saylor: Fever over 101.0 Nausea and/or vomiting Extreme swelling or bruising Continued bleeding from incision. Increased pain, redness, or drainage from the incision. The clinic staff is available to answer your questions during regular business hours.  Please don't hesitate to call and ask to speak to one of the nurses for clinical concerns.  If you have a medical emergency, go to the nearest emergency room or call 911.  A surgeon from [facility] is always on call at the hospital. [address], [address], [place], [place]  [address] ? [address] [address], [place], [place]   [address] [phone] ? [phone] ? FAX [phone]  Web site: [email or web address]    Post Anesthesia Home Care Instructions  Activity: Get plenty of rest for the remainder of  the day. A responsible individual must stay with you for 24 hours following the procedure.  For the next 24 hours, DO NOT: -Drive a car -Advertising copywriter -Drink alcoholic beverages -Take any medication unless instructed by your physician -Make any legal decisions or sign important papers.  Meals: Start with liquid foods such as gelatin or soup. Progress to regular foods as tolerated. Avoid greasy, spicy, heavy foods. If nausea and/or vomiting occur, drink only clear liquids until the nausea and/or vomiting subsides. Call your physician if vomiting continues.  Special Instructions/Symptoms: Your throat may feel dry or sore from the anesthesia or the breathing tube placed in your throat during surgery. If this causes discomfort, gargle with warm salt water. The discomfort should disappear within 24 hours.  If you had a scopolamine patch placed behind your ear for the management of post- operative nausea and/or vomiting:  1. The medication in the patch is effective for 72 hours, after which it should be removed.  Wrap patch in a tissue and discard in the trash. Wash hands thoroughly with soap and water. 2. You may remove the patch earlier than 72 hours if you experience unpleasant side effects which may include dry mouth, dizziness or visual disturbances. 3. Avoid touching the patch. Wash your hands with soap and water after contact with the patch.     Information for Discharge Teaching: EXPAREL (bupivacaine liposome injectable suspension)   Pain relief is important to your recovery. The goal is to control your pain so you can move easier and return to your normal activities as soon as possible after your procedure. Your physician may use several types of medicines to manage pain, swelling, and more.  Your surgeon or anesthesiologist gave you EXPAREL(bupivacaine) to help control your pain after surgery.  EXPAREL is a local anesthetic designed to release slowly over an extended period of  time to provide pain relief by numbing the tissue around the surgical site. EXPAREL is designed to release pain medication over time and can control pain for up to 72 hours. Depending on how you respond to EXPAREL, you may require less pain medication during your recovery. EXPAREL can help reduce or eliminate the need for opioids during the first few days after surgery when pain relief is needed the most. EXPAREL is not an opioid and is not addictive. It does not cause sleepiness or sedation.   Important! A teal colored band has been placed on your arm with the date, time and amount of EXPAREL you have received. Please leave this armband in place for the full 96 hours following administration, and then you may remove the band. If you return to the hospital for any reason within 96 hours following the administration of EXPAREL, the armband provides important information that your health care providers to know, and alerts them that you have received this anesthetic.    Possible side effects of EXPAREL: Temporary loss of sensation or ability to move in the area where medication was injected. Nausea, vomiting, constipation Rarely, numbness and tingling in your mouth or lips, lightheadedness, or anxiety may occur. Call your doctor right away if you think you may be experiencing any of these sensations, or if you have other questions regarding possible side effects.  Follow all other discharge instructions given to you by your surgeon or nurse. Eat a healthy diet and drink plenty of water or  other fluids.  About my Jackson-Pratt Bulb Drain  What is a Jackson-Pratt bulb? A Jackson-Pratt is a soft, round device used to collect drainage. It is connected to a long, thin drainage catheter, which is held in place by one or two small stiches near your surgical incision site. When the bulb is squeezed, it forms a vacuum, forcing the drainage to empty into the bulb.  Emptying the Jackson-Pratt bulb- To empty  the bulb: 1. Release the plug on the top of the bulb. 2. Pour the bulb's contents into a measuring container which your nurse will provide. 3. Record the time emptied and amount of drainage. Empty the drain(s) as often as your     doctor or nurse recommends.  Date                  Time                    Amount (Drain 1)                 Amount (Drain 2)  _____________________________________________________________________  _____________________________________________________________________  _____________________________________________________________________  _____________________________________________________________________  _____________________________________________________________________  _____________________________________________________________________  _____________________________________________________________________  _____________________________________________________________________  Squeezing the Jackson-Pratt Bulb- To squeeze the bulb: 1. Make sure the plug at the top of the bulb is open. 2. Squeeze the bulb tightly in your fist. You will hear air squeezing from the bulb. 3. Replace the plug while the bulb is squeezed. 4. Use a safety pin to attach the bulb to your clothing. This will keep the catheter from     pulling at the bulb insertion site.  When to call your doctor- Call your doctor if: Drain site becomes red, swollen or hot. You have a fever greater than 101 degrees F. There is oozing at the drain site. Drain falls out (apply a guaze bandage over the drain hole and secure it with tape). Drainage increases daily not related to activity patterns. (You will usually have more drainage when you are active than when you are resting.) Drainage has a bad odor.   Received tramadol at 530am Received tylenol at 530am

## 2023-12-12 NOTE — Progress Notes (Signed)
Assisted Dr. Doroteo Glassman with left, pectoralis, ultrasound guided block. Side rails up, monitors on throughout procedure. See vital signs in flow sheet. Tolerated Procedure well.

## 2023-12-12 NOTE — Op Note (Signed)
  Preoperative diagnosis: clinical stage II left breast cancer Postoperative diagnosis: saa Procedure: Left mastectomy Surgeon: Dr. 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  This is a HER2 positive tumor.  She attempted to begin neoadjuvant chemotherapy but is unable to tolerate this.  She then elected to proceed with lumpectomy and sentinel lymph node biopsy.  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  She has 2 of 4 sentinel nodes that are positive with macrometastases as well.  We had a discussion about how to proceed with additional surgery and she chose to undergo mastectomy without reconstruction.   Procedure: After informed consent was obtained she was taken to the operating room.  She first got a pectoral block.  SCDs were in place.  Antibiotics were given.  She was placed under general anesthesia without complication.  She was prepped and draped in standard sterile surgical fashion.  Surgical timeout was then performed.    I had to make a fairly large elliptical incision to encompass her old scar.   I then created flaps to the clavicle, sternum, inframammary fold as well as the latissimus.  I then obliterated the inframammary fold in order to attempt to close this in a flat fashion.  I then remove the breast and the pectoralis fashion from the muscle.  This was then marked as above.  I then obtained hemostasis.  I placed a TXA soaked sponge for 5 minutes.  I then placed some Arista in her axilla.  I placed a 50 [place] Blake drain and secured this with a 2-0 nylon suture.  I then  began closing this with 3-0 Vicryl.  The skin was closed with 4-0 Monocryl glue and Steri-Strips.  She did tolerate this well was extubated and transferred to recovery.

## 2023-12-12 NOTE — Anesthesia Procedure Notes (Signed)
 Procedure Name: LMA Insertion Date/Time: 12/12/2023 9:06 AM  Performed by: Burna Cash, CRNAPre-anesthesia Checklist: Patient identified, Emergency Drugs available, Suction available and Patient being monitored Patient Re-evaluated:Patient Re-evaluated prior to induction Oxygen Delivery Method: Circle system utilized Preoxygenation: Pre-oxygenation with 100% oxygen Induction Type: IV induction Ventilation: Mask ventilation without difficulty LMA: LMA inserted LMA Size: 4.0 Number of attempts: 1 Airway Equipment and Method: Bite block Placement Confirmation: positive ETCO2 Tube secured with: Tape Dental Injury: Teeth and Oropharynx as per pre-operative assessment

## 2023-12-12 NOTE — Interval H&P Note (Signed)
 History and Physical Interval Note:  12/12/2023 60:17 AM 73 year old female who I recently did a lumpectomy and sentinel node on. She was unable to tolerate chemotherapy for this cancer. She is doing well after surgery. 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 This ends up being at the lateral margin. This previously was ER negative, PR negative, HER2 positive. She has 2 of 4 sentinel nodes with macrometastases. She is here today to discuss her options We discussed either a repeat lumpectomy or mastectomy. I am concerned without the ability to image her more preoperatively and the extent of this tumor that this is going to be considerably more. We discussed a reexcision lumpectomy versus mastectomy. We discussed that she would need radiotherapy with both. This decision has no bearing on systemic therapy. She would like to proceed with a mastectomy without reconstruction. We discussed surgery, risks, and recovery and I will plan to schedule her soon.  Rebecca Steele  has presented today for surgery, with the diagnosis of LEFT BREAST CANCER.  The various methods of treatment have been discussed with the patient and family. After consideration of risks, benefits and other options for treatment, the patient has consented to  Procedure(s): LEFT  MASTECTOMY (Left) as a surgical intervention.  The patient's history has been reviewed, patient examined, no change in status, stable for surgery.  I have reviewed the patient's chart and labs.  Questions were answered to the patient's satisfaction.     [name]

## 2023-12-12 NOTE — Anesthesia Procedure Notes (Signed)
 Anesthesia Regional Block: Pectoralis block   Pre-Anesthetic Checklist: , timeout performed,  Correct Patient, Correct Site, Correct Laterality,  Correct Procedure, Correct Position, site marked,  Risks and benefits discussed,  Surgical consent,  Pre-op evaluation,  At surgeon's request and post-op pain management  Laterality: Left  Prep: Maximum Sterile Barrier Precautions used, chloraprep       Needles:  Injection technique: Single-shot  Needle Type: Echogenic Stimulator Needle     Needle Length: 9cm  Needle Gauge: 22     Additional Needles:   Procedures:,,,, ultrasound used (permanent image in chart),,    Narrative:  Start time: 12/12/2023 8:10 AM End time: 12/12/2023 8:15 AM Injection made incrementally with aspirations every 5 mL.  Performed by: Personally  Anesthesiologist: Lannie Fields, DO  Additional Notes: Monitors applied. No increased pain on injection. No increased resistance to injection. Injection made in 5cc increments. Good needle visualization. Patient tolerated procedure well.

## 2023-12-12 NOTE — Anesthesia Postprocedure Evaluation (Signed)
 Anesthesia Post Note  Patient: Rebecca Steele  Procedure(s) Performed: LEFT  MASTECTOMY (Left: Breast)     Patient location during evaluation: PACU Anesthesia Type: Regional and General Level of consciousness: awake and alert, oriented and patient cooperative Pain management: pain level controlled Vital Signs Assessment: post-procedure vital signs reviewed and stable Respiratory status: spontaneous breathing, nonlabored ventilation and respiratory function stable Cardiovascular status: blood pressure returned to baseline and stable Postop Assessment: no apparent nausea or vomiting Anesthetic complications: no   No notable events documented.  Last Vitals:  Vitals:   12/12/23 1115 12/12/23 1130  BP: 118/70 (!) 142/83  Pulse: 78 81  Resp: 13 18  Temp:    SpO2: 96% 97%    Last Pain:  Vitals:   12/12/23 1130  TempSrc:   PainSc: 5                  Lannie Fields

## 2023-12-12 NOTE — Transfer of Care (Signed)
 Immediate Anesthesia Transfer of Care Note  Patient: Rebecca Steele  Procedure(s) Performed: LEFT  MASTECTOMY (Left: Breast)  Patient Location: PACU  Anesthesia Type:General and Regional  Level of Consciousness: sedated  Airway & Oxygen Therapy: Patient Spontanous Breathing and Patient connected to face mask oxygen  Post-op Assessment: Report given to RN and Post -op Vital signs reviewed and stable  Post vital signs: Reviewed and stable  Last Vitals:  Vitals Value Taken Time  BP 108/62 12/12/23 1045  Temp    Pulse 80 12/12/23 1047  Resp 18 12/12/23 1047  SpO2 98 % 12/12/23 1047  Vitals shown include unfiled device data.  Last Pain:  Vitals:   12/12/23 0730  TempSrc: Oral  PainSc: 0-No pain      Patients Stated Pain Goal: 4 (12/12/23 0730)  Complications: No notable events documented.

## 2023-12-13 ENCOUNTER — Encounter (HOSPITAL_BASED_OUTPATIENT_CLINIC_OR_DEPARTMENT_OTHER): Payer: Self-pay | Admitting: General Surgery

## 2023-12-13 DIAGNOSIS — D0512 Intraductal carcinoma in situ of left breast: Secondary | ICD-10-CM | POA: Diagnosis not present

## 2023-12-13 MED ORDER — METHOCARBAMOL 500 MG PO TABS: 500.0000 mg | ORAL_TABLET | Freq: Three times a day (TID) | ORAL | 0 refills | Status: DC | PRN

## 2023-12-13 MED ORDER — TRAMADOL HCL 50 MG PO TABS: 50.0000 mg | ORAL_TABLET | Freq: Four times a day (QID) | ORAL | 0 refills | Status: DC | PRN

## 2023-12-13 NOTE — Discharge Summary (Signed)
 Physician Discharge Summary  Patient ID: Rebecca Steele MRN: [id number] DOB/AGE: December 22, 1950 73 y.o.  Admit date: 12/12/2023 Discharge date: 12/13/2023  Admission Diagnoses: Left breast cancer Discharge Diagnoses:  Principal Problem:   S/P left mastectomy   Discharged Condition: good  Hospital Course: 88 yof initially started primary chemotherapy but unable to tolerate. Underwent lumpectomy/tad with pos margin elected to proceed with mastectomy. She underwent left mastectomy and is doing well following am.  Pain controlled, voiding, tol diet, ambulating  Consults: None  Significant Diagnostic Studies: none  Treatments: surgery: left mastectomy  Discharge Exam: Blood pressure 127/75, pulse 68, temperature 97.9 F (36.6 C), resp. rate 18, height 5\' 5"  (1.651 m), weight 86.4 kg, SpO2 97%. No hematoma, drain as expected with low output, flaps viable  Disposition: Discharge disposition: 01-Home or Self Care        Allergies as of 12/13/2023       Reactions   Gadolinium Derivatives Anaphylaxis, Shortness Of Breath   Pt was given 17ml multihance for MRI. After about 51min30 seconds she squeezed the ball saying she felt like her throat was closing up. Exam was DC'd and radiologist gave 75mg  benadryl IV.    Other    Pt states she was given Gabapentin and Fentanyl for anesthesia during recent surgery and was unable to wake up for several hours.  Pt states she does not want to have sedation with those medications in the future.   Valium Anaphylaxis   Paxlovid [nirmatrelvir-ritonavir] Hives   Doxycycline Swelling   Facial swelling Pt has since had without problem   Fentanyl Other (See Comments)   Slept the whole time for few days   Iohexol Itching    Desc: Pt. stated she had iv contrast around 25 yrs ago and had itching on her neck.  she took benadryl and it went away.  The rad recommended premeds and be done tomorrow but the pt.did not want to do that so we did her w/o iv  contrast today., Onset Date: [id number]   Macrodantin Nausea And Vomiting   Red Dye #40 (allura Red) Swelling   Facial swelling (cannot take pink or red tablets or capsules)   Rocephin [ceftriaxone] Other (See Comments)   Joint aches   Zithromax [azithromycin] Other (See Comments)   Weakness, cold/clammy, legs trembling        Medication List     TAKE these medications    butalbital-acetaminophen-caffeine 50-325-40 MG tablet Commonly known as: FIORICET Take 1 tablet by mouth 2 (two) times daily as needed for headache or migraine.   cholecalciferol 25 MCG (1000 UNIT) tablet Commonly known as: VITAMIN D3 Take 1,000 Units by mouth daily.   levothyroxine 25 MCG tablet Commonly known as: SYNTHROID Take 25 mcg by mouth daily before breakfast.   loratadine 10 MG tablet Commonly known as: CLARITIN Take 10 mg by mouth daily.   methocarbamol 500 MG tablet Commonly known as: ROBAXIN Take 1 tablet (500 mg total) by mouth every 8 (eight) hours as needed for muscle spasms.   montelukast 10 MG tablet Commonly known as: SINGULAIR Take 10 mg by mouth at bedtime.   mupirocin ointment 2 % Commonly known as: BACTROBAN Apply 1 Application topically 2 (two) times daily.   REPATHA [place] Inject into the skin.   sertraline 50 MG tablet Commonly known as: ZOLOFT Take 50 mg by mouth daily.   traMADol 50 MG tablet Commonly known as: ULTRAM Take 1 tablet (50 mg total) by mouth every 6 (six) hours as needed (  mild pain). What changed: Another medication with the same name was added. Make sure you understand how and when to take each.   traMADol 50 MG tablet Commonly known as: ULTRAM Take 1 tablet (50 mg total) by mouth every 6 (six) hours as needed (mild pain). What changed: You were already taking a medication with the same name, and this prescription was added. Make sure you understand how and when to take each.   traZODone 100 MG tablet Commonly known as: DESYREL Take 100 mg by mouth  at bedtime.        Follow-up Information     [name], MD Follow up in 2 week(s).   Specialty: General Surgery Contact information: [address] [place] [address] [phone]                 Signed: [name] 12/13/2023, 8:07 AM

## 2023-12-14 ENCOUNTER — Other Ambulatory Visit: Payer: Self-pay

## 2023-12-14 LAB — SURGICAL PATHOLOGY

## 2023-12-18 ENCOUNTER — Encounter: Payer: Self-pay | Admitting: *Deleted

## 2023-12-21 ENCOUNTER — Ambulatory Visit: Payer: HMO | Admitting: Hematology and Oncology

## 2023-12-21 ENCOUNTER — Ambulatory Visit: Payer: HMO

## 2023-12-21 ENCOUNTER — Other Ambulatory Visit: Payer: HMO

## 2023-12-23 ENCOUNTER — Ambulatory Visit: Payer: HMO

## 2024-01-03 ENCOUNTER — Ambulatory Visit

## 2024-01-04 NOTE — Therapy (Signed)
 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 states she was given gabapentin and fentanyl with breast surgery and was diff to wake up   Depression    History of kidney stones    Hypothyroidism    Mitral valve prolapse    Sleep apnea    HAS MILD OSA, NO CPAP NEEDED   Past Surgical History:  Procedure Laterality Date   APPENDECTOMY     BREAST BIOPSY Right 09/16/2022   MM RT BREAST BX W LOC DEV 1ST LESION IMAGE BX SPEC STEREO GUIDE 09/16/2022 [facility]   BREAST BIOPSY  12/06/2022   MM RT RADIOACTIVE SEED LOC MAMMO GUIDE 12/06/2022 [facility]   BREAST BIOPSY Left 11/16/2023   [place] LT RADIOACTIVE SEED LOC 11/16/2023 [facility]   BREAST BIOPSY  11/16/2023   MM LT RADIOACTIVE SEED EA ADD LESION LOC MAMMO GUIDE 11/16/2023 [facility]   BREAST EXCISIONAL BIOPSY Right 2018   BREAST LUMPECTOMY WITH RADIOACTIVE SEED AND SENTINEL LYMPH NODE BIOPSY Left 11/20/2023   Procedure: LEFT BREAST SEED GUIDED LUMPECTOMY, LEFT AXILLARY SENTINEL NODE BIOPSY;  Surgeon: [name], MD;  Location: [facility];  Service: General;  Laterality: Left;  PEC BLOCK   CHOLECYSTECTOMY     CYSTOSCOPY W/ RETROGRADES     KYPHOPLASTY N/A 10/05/2021   Procedure: LUMBAR ONE  KYPHOPLASTY;  Surgeon: Jadene Pierini, MD;  Location: [facility];  Service: Neurosurgery;  Laterality: N/A;   NECK SURGERY     PORTACATH PLACEMENT Right 10/12/2023   Procedure: INSERTION PORT-A-CATH WITH GUIDED ULTRASOUND;  Surgeon: [name], MD;  Location: [facility];  Service: General;  Laterality: Right;   RADIOACTIVE SEED GUIDED AXILLARY SENTINEL LYMPH NODE Left 11/20/2023   Procedure: LEFT AXILLARY NODE SEED GUIDED EXCISION;  Surgeon: [name], MD;  Location: [facility];  Service: General;  Laterality: Left;   RADIOACTIVE SEED GUIDED EXCISIONAL BREAST BIOPSY Right 12/12/2017   Procedure: RIGHT RADIOACTIVE SEED GUIDED EXCISIONAL BREAST BIOPSY ERAS PATHWAY;  Surgeon: [name], MD;  Location: [facility];  Service: General;  Laterality: Right;  LMA   RADIOACTIVE SEED GUIDED EXCISIONAL BREAST BIOPSY Right 12/07/2022   Procedure: RADIOACTIVE SEED GUIDED EXCISIONAL RIGHT BREAST BIOPSY;  Surgeon: [name], MD;  Location: [facility];  Service: General;  Laterality: Right;   SHOULDER SURGERY     SIMPLE MASTECTOMY WITH AXILLARY SENTINEL NODE BIOPSY Left 12/12/2023   Procedure: LEFT  MASTECTOMY;  Surgeon: [name], MD;  Location: [facility];  Service: General;  Laterality: Left;   TONSILLECTOMY     TOTAL  HIP ARTHROPLASTY Left 06/04/2021   Procedure: TOTAL HIP ARTHROPLASTY ANTERIOR APPROACH;  Surgeon: Jodi Geralds, MD;  Location: [facility];  Service: Orthopedics;  Laterality: Left;   TRIGGER FINGER RELEASE Right 05/18/2015   Procedure: RIGHT  LONG FINGER TRIGGER RELEASE ;  Surgeon: Mack Hook, MD;  Location: [facility];  Service: Orthopedics;  Laterality: Right;   Patient Active Problem List   Diagnosis Date Noted   S/P left mastectomy 12/12/2023   Breast cancer, left breast (HCC) 11/20/2023   Port-A-Cath in place 10/18/2023   Malignant neoplasm of upper inner  quadrant of female breast (HCC) 10/09/2023   Closed compression fracture of body of L1 vertebra (HCC) 10/05/2021   Compression fracture of L1 lumbar vertebra (HCC) 10/04/2021   Fall at home, initial encounter 10/04/2021   Hypokalemia 10/04/2021   Scalp laceration 10/04/2021   Leukocytosis 10/04/2021   Primary osteoarthritis of left hip 06/04/2021   Atypical ductal hyperplasia of right breast 03/19/2018   Obtundation 12/12/2017    PCP:   REFERRING PROVIDER: [name], MD  REFERRING DIAG: Left Breast Cancer  THERAPY DIAG:  Malignant neoplasm of upper-inner quadrant of left breast in female, estrogen receptor negative (HCC)  Abnormal posture  Rationale for Evaluation and Treatment: Rehabilitation  ONSET DATE: 09/22/2023  SUBJECTIVE:                                                                                                                                                                                            SUBJECTIVE STATEMENT:  I saw my PCP for the left axillary redness and drainage and she put me on antibiotics. I was already on Bactrim for my toe and she put me on vibramycin on Wednesday.  It is improved some, but its hard to tell. I didn't do the exercises I was given but I have been moving my arm a lot. I am really sore at my chest.  PERTINENT HISTORY:  Patient was diagnosed on 09/22/23 with left grade 3 IDC. It measures at least 5mm and is located in the upper inner quadrant. It is ER/PR neg, HER2+ with 1 metastatic lymph node. Pt had  neoadjuvant chemotherapy TCHP regimen 1/17-1/20/2025. She had significant side effects to chemotheray and did not wish to proceed. She underwent left lumpectomy with SLNB on 11/20/2023 with macrometastases of 2 of 4 LN's. She underwent a left Mastectomy on 12/12/2023. Saw MD 01/03/2024 with left arm pit redness and possible drainage.Other hx: Cervical Fusion . She has radiation simulation on April 15 and starts chemo on the 15th for a  year. PATIENT GOALS:  Reassess how my recovery is going related  to arm function, pain, and swelling.   PAIN:  Are you having pain? No, just discomfort  PRECAUTIONS: Recent Surgery, left UE Lymphedema risk,   RED FLAGS: None   ACTIVITY LEVEL / LEISURE: music, handbell choir, plays piano and organ   OBJECTIVE:   PATIENT SURVEYS:  QUICK DASH: 27%  OBSERVATIONS: Mastectomy incision with scabs still present, axillary incision with thickened yellow area of prior discharge. Took a sterile Q tip to try and clean the area but it was not budging with gentle pressure. Observed the area while pt did her exercises with no opening of the area noted.  POSTURE:  Forward head and rounded shoulders posture   LYMPHEDEMA ASSESSMENT:  UPPER EXTREMITY AROM/PROM:   A/PROM RIGHT   eval   RIGHT  Shoulder extension 50   Shoulder flexion 145   Shoulder abduction 165   Shoulder internal rotation 75   Shoulder external rotation 80                           (Blank rows = not tested)   A/PROM LEFT   eval LEFT  Shoulder extension 60 60  Shoulder flexion 150 140  Shoulder abduction 160 130  Shoulder internal rotation 75 60  Shoulder external rotation 75 95                          (Blank rows = not tested)   CERVICAL AROM:     Percent limited  Flexion    Extension 50%  Right lateral flexion    Left lateral flexion    Right rotation 50%  Left rotation        UPPER EXTREMITY STRENGTH:    5/5 Lt                                                             5/5 Rt except for ER at 4/5   LYMPHEDEMA ASSESSMENTS (in cm):    LANDMARK RIGHT   eval  10 cm proximal to olecranon process 32  Olecranon process 28  10 cm proximal to ulnar styloid process 20.9  Just proximal to ulnar styloid process 17.2  Across hand at thumb web space 19.3  At base of 2nd digit 6.6  (Blank rows = not tested)   LANDMARK LEFT   eval LEFT 01/05/2024  10 cm proximal to olecranon process 31.8 31.4  Olecranon  process 28.5 27.1  10 cm proximal to ulnar styloid process 21.4 21.4  Just proximal to ulnar styloid process 17.8 16.5  Across hand at thumb web space 19.5 18.1  At base of 2nd digit 6.6 6.7  (Blank rows = not tested)      Surgery type/Date: left lumpectomy with SLNB on 11/20/2023 with macrometastasess of 2 of 4 LN's. She underwent a left Mastectomy on 12/12/2023 Number of lymph nodes removed: 2/4 Current/past treatment (chemo, radiation, hormone therapy): pending radiation and chemotherapy starting April 15th Other symptoms:  Heaviness/tightness Yestight Pain Nodiscomfort Pitting edema No Infections Yes being treated with antibiotic Decreased scar mobility Yes Stemmer sign No  PATIENT EDUCATION:  Education details: Scar massage when incisions healed and no scabs, reviewed 4 post op exercises and had pt perform while I observed left axilla, return  to walking when toe healed, SOZO screens,compression bra prn for swelling Person educated: Patient Education method: Explanation and Handouts Education comprehension: verbalized understanding and returned demonstration  HOME EXERCISE PROGRAM: Reviewed previously given post op HEP. Pt performed each x 5 new handout given  ASSESSMENT:  CLINICAL IMPRESSION: Pt is s/p a left lumpectomy with SLNB on 11/20/2023 with macrometastasess in 2 of 4 LN's. She  then underwent a left Mastectomy on 12/12/2023 with 1 benign Inframammary LN. She is pending radiation and chemotherapy starting on 01/16/2024. She has an area in left axilla with thickened yellow area of prior drainage. Area was attempted to be cleaned but did not come off easily so was left. There was no opening of the incision area when she did her exercises.  Her ROM is mildly limited for shoulder flexion, and more limited for abd and ER. We reviewed all exercises today and a new handout was given. There is no sign of lymphedema. Mastectomy incision is healing well with many scabs still present.  We discussed scar massage when all scabs are off. She was also set up for her 3 month SOZO screen today. She was scheduled for 1 additional visit next week to reassess her ROM since she will be having radiation soon. If ROM is back to normal she will be released from formal PT  Pt will benefit from skilled therapeutic intervention to improve on the following deficits: Decreased knowledge of precautions, impaired UE functional use, pain, decreased ROM, postural dysfunction.   PT treatment/interventions: ADL/Self care home management, [phone]- PT Re-evaluation, 97110-Therapeutic exercises, 97530- Therapeutic activity, [id number]- Neuromuscular re-education, 97535- Self Care, 78469- Manual therapy, and 97760- Orthotic Fit/training   GOALS: Goals reviewed with patient? Yes  LONG TERM GOALS:  (STG=LTG)  GOALS Name Target Date  Goal status  1 Pt will demonstrate she has regained full shoulder ROM and function post operatively compared to baselines.  Baseline: 01/26/2024 INITIAL  2   INITIAL  3   INITIAL  4   INITIAL     PLAN:  PT FREQUENCY/DURATION: Pt was scheduled for 1 additional visit to recheck limitations in ROM. If she is doing well she will be released. If she is still limited several more visits will be scheduled  PLAN FOR NEXT SESSION: How is axillary incision?, check incision, ROM, start with ROM exercises, reassess ROM, Add lat,pectoral stretches prn, PROM prn Are scabs off mastectomy incision? Remind about scar massage   [facility]  [address], [address]  [place] [address]  [phone]  After Breast Cancer Class Video It is recommended you view the ABC class video to be educated on lymphedema risk reduction. This video lasts for about 30 minutes. It can be viewed on our website here: [email or web address]  Scar massage You can begin gentle scar massage to you incision sites. Gently  place one hand on the incision and move the skin (without sliding on the skin) in various directions. Do this for a few minutes and then you can gently massage either coconut oil or vitamin E cream into the scars.  Compression garment You should continue wearing your compression bra until you feel like you no longer have swelling.  Home exercise Program Continue doing the exercises you were given until you feel like you can do them without feeling any tightness at the end.   Walking Program Studies show that 30 minutes of walking per day (fast enough to elevate your heart rate) can significantly reduce the risk of a  cancer recurrence. If you can't walk due to other medical reasons, we encourage you to find another activity you could do (like a stationary bike or water exercise).  Posture After breast cancer surgery, people frequently sit with rounded shoulders posture because it puts their incisions on slack and feels better. If you sit like this and scar tissue forms in that position, you can become very tight and have pain sitting or standing with good posture. Try to be aware of your posture and sit and stand up tall to heal properly.  Follow up PT: It is recommended you return every 3 months for the first 3 years following surgery to be assessed on the SOZO machine for an L-Dex score. This helps prevent clinically significant lymphedema in 95% of patients. These follow up screens are 10 minute appointments that you are not billed for.  Waynette Buttery, PT 01/05/2024, 12:08 PM

## 2024-01-05 ENCOUNTER — Ambulatory Visit: Attending: General Surgery

## 2024-01-05 DIAGNOSIS — M25612 Stiffness of left shoulder, not elsewhere classified: Secondary | ICD-10-CM | POA: Insufficient documentation

## 2024-01-05 DIAGNOSIS — Z171 Estrogen receptor negative status [ER-]: Secondary | ICD-10-CM | POA: Diagnosis present

## 2024-01-05 DIAGNOSIS — R293 Abnormal posture: Secondary | ICD-10-CM | POA: Diagnosis present

## 2024-01-05 DIAGNOSIS — C50212 Malignant neoplasm of upper-inner quadrant of left female breast: Secondary | ICD-10-CM | POA: Diagnosis present

## 2024-01-05 NOTE — Patient Instructions (Addendum)
     [facility]  [address]  [place] [address]  [phone] [facility]  [address]  [place] [address]  [phone]  After Breast Cancer Class Video It is recommended you view the ABC class video to be educated on lymphedema risk reduction. This video lasts for about 30 minutes. It can be viewed on our website here: [email or web address]  Scar massage You can begin gentle scar massage to you incision sites. Gently place one hand on the incision and move the skin (without sliding on the skin) in various directions. Do this for a few minutes and then you can gently massage either coconut oil or vitamin E cream into the scars.  Compression garment You should continue wearing your compression bra until you feel like you no longer have swelling.  Home exercise Program Continue doing the exercises you were given until you feel like you can do them without feeling any tightness at the end.   Walking Program Studies show that 30 minutes of walking per day (fast enough to elevate your heart rate) can significantly reduce the risk of a cancer recurrence. If you can't walk due to other medical reasons, we encourage you to find another activity you could do (like a stationary bike or water exercise).  Posture After breast cancer surgery, people frequently sit with rounded shoulders posture because it puts their incisions on slack and feels better. If you sit like this and scar tissue forms in that position, you can become very tight and have pain sitting or standing with good posture. Try to be aware of your posture and sit and stand up tall to heal properly.  Follow up PT: It is recommended you return every 3 months for the first 3 years following surgery to be assessed on the SOZO machine for an L-Dex score. This helps prevent clinically significant  lymphedema in 95% of patients. These follow up screens are 10 minute appointments that you are not billed for.

## 2024-01-06 ENCOUNTER — Other Ambulatory Visit: Payer: Self-pay

## 2024-01-09 NOTE — Progress Notes (Signed)
 Pharmacist Chemotherapy Monitoring - Initial Assessment    Anticipated start date: 01/16/24   The following has been reviewed per standard work regarding the patient's treatment regimen: The patient's diagnosis, treatment plan and drug doses, and organ/hematologic function Lab orders and baseline tests specific to treatment regimen  The treatment plan start date, drug sequencing, and pre-medications Prior authorization status  Patient's documented medication list, including drug-drug interaction screen and prescriptions for anti-emetics and supportive care specific to the treatment regimen The drug concentrations, fluid compatibility, administration routes, and timing of the medications to be used The patient's access for treatment and lifetime cumulative dose history, if applicable  The patient's medication allergies and previous infusion related reactions, if applicable   Changes made to treatment plan:  N/A  Follow up needed:  F/U ECHO 01/12/24   Ebony Hail, Pharm.D., CPP 01/09/2024@2 :21 PM

## 2024-01-10 NOTE — Progress Notes (Signed)
 Apt moved

## 2024-01-11 ENCOUNTER — Ambulatory Visit
Admission: RE | Admit: 2024-01-11 | Discharge: 2024-01-11 | Disposition: A | Discharge status: Home / Self Care | Source: Ambulatory Visit | Attending: Radiation Oncology | Admitting: Radiation Oncology

## 2024-01-11 ENCOUNTER — Ambulatory Visit: Payer: HMO | Admitting: Hematology and Oncology

## 2024-01-11 ENCOUNTER — Ambulatory Visit: Payer: HMO

## 2024-01-11 ENCOUNTER — Ambulatory Visit: Payer: Self-pay

## 2024-01-11 ENCOUNTER — Other Ambulatory Visit: Payer: HMO

## 2024-01-11 ENCOUNTER — Ambulatory Visit

## 2024-01-11 DIAGNOSIS — R293 Abnormal posture: Secondary | ICD-10-CM

## 2024-01-11 DIAGNOSIS — M25612 Stiffness of left shoulder, not elsewhere classified: Secondary | ICD-10-CM

## 2024-01-11 DIAGNOSIS — Z171 Estrogen receptor negative status [ER-]: Secondary | ICD-10-CM

## 2024-01-11 DIAGNOSIS — C50212 Malignant neoplasm of upper-inner quadrant of left female breast: Secondary | ICD-10-CM | POA: Diagnosis not present

## 2024-01-11 NOTE — Progress Notes (Signed)
 Nursing interview for a diagnosis of LT breast cancer.   Patient identity verified x2.   Patient reports: Doing well.   Pain: None ROM (LT arm): Mildly limited Cardiac/Lungs: None   Breast surgery- Yes- LT chest healing well from total mastectomy on 12/12/2023, by Dr. [name].  All related body systems reviewed w/ patient.   Patient denies any related issues at this time.   Possible current pregnancy? No- Postmenopausal   Meaningful use complete.  Vitals- limited via phone : Ht 5\' 5"  (1.651 m)   Wt 189 lb (85.7 kg)   BMI 31.45 kg/m   This concludes the interaction.  Ruel Favors, LPN

## 2024-01-11 NOTE — Therapy (Signed)
 OUTPATIENT PHYSICAL THERAPY BREAST CANCER POST OP TREATMENT   Patient Name: Rebecca Steele MRN: [id number] DOB:[birth year], 73 y.o., female Today's Date: 01/11/2024  END OF SESSION:  PT End of Session - 01/11/24 1159     Visit Number 3    Number of Visits 8    Date for PT Re-Evaluation 01/26/24    PT Start Time 1159    PT Stop Time 1243    PT Time Calculation (min) 44 min    Activity Tolerance Patient tolerated treatment well    Behavior During Therapy [facility] for tasks assessed/performed             Past Medical History:  Diagnosis Date   Breast mass, right    Cancer (HCC) 10/2023   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 states she was given gabapentin and fentanyl with breast surgery and was diff to wake up   Depression    History of kidney stones    Hypothyroidism    Mitral valve prolapse    Sleep apnea    HAS MILD OSA, NO CPAP NEEDED   Past Surgical History:  Procedure Laterality Date   APPENDECTOMY     BREAST BIOPSY Right 09/16/2022   MM RT BREAST BX W LOC DEV 1ST LESION IMAGE BX SPEC STEREO GUIDE 09/16/2022 [facility]   BREAST BIOPSY  12/06/2022   MM RT RADIOACTIVE SEED LOC MAMMO GUIDE 12/06/2022 [facility]   BREAST BIOPSY Left 11/16/2023   [place] LT RADIOACTIVE SEED LOC 11/16/2023 [facility]   BREAST BIOPSY  11/16/2023   MM LT RADIOACTIVE SEED EA ADD LESION LOC MAMMO GUIDE 11/16/2023 [facility]   BREAST EXCISIONAL BIOPSY Right 2018   BREAST LUMPECTOMY WITH RADIOACTIVE SEED AND SENTINEL LYMPH NODE BIOPSY Left 11/20/2023   Procedure: LEFT BREAST SEED GUIDED LUMPECTOMY, LEFT AXILLARY SENTINEL NODE BIOPSY;  Surgeon: [name], MD;  Location: [facility];  Service: General;  Laterality: Left;  PEC BLOCK   CHOLECYSTECTOMY     CYSTOSCOPY W/ RETROGRADES     KYPHOPLASTY N/A 10/05/2021   Procedure: LUMBAR ONE  KYPHOPLASTY;  Surgeon: Jadene Pierini, MD;  Location: [facility];  Service: Neurosurgery;  Laterality: N/A;   NECK SURGERY     PORTACATH PLACEMENT Right 10/12/2023   Procedure: INSERTION PORT-A-CATH WITH GUIDED ULTRASOUND;  Surgeon: [name], MD;  Location: [facility];  Service: General;  Laterality: Right;   RADIOACTIVE SEED GUIDED AXILLARY SENTINEL LYMPH NODE Left 11/20/2023   Procedure: LEFT AXILLARY NODE SEED GUIDED EXCISION;  Surgeon: [name], MD;  Location: [facility];  Service: General;  Laterality: Left;   RADIOACTIVE SEED GUIDED EXCISIONAL BREAST BIOPSY Right 12/12/2017   Procedure: RIGHT RADIOACTIVE SEED GUIDED EXCISIONAL BREAST BIOPSY ERAS PATHWAY;  Surgeon: [name], MD;  Location: [facility];  Service: General;  Laterality: Right;  LMA   RADIOACTIVE SEED GUIDED EXCISIONAL BREAST BIOPSY Right 12/07/2022   Procedure: RADIOACTIVE SEED GUIDED EXCISIONAL RIGHT BREAST BIOPSY;  Surgeon: [name], MD;  Location: [facility];  Service: General;  Laterality: Right;   SHOULDER SURGERY     SIMPLE MASTECTOMY WITH AXILLARY SENTINEL NODE BIOPSY Left 12/12/2023   Procedure: LEFT  MASTECTOMY;  Surgeon: [name], MD;  Location: [facility];  Service: General;  Laterality: Left;   TONSILLECTOMY     TOTAL HIP  ARTHROPLASTY Left 06/04/2021   Procedure: TOTAL HIP ARTHROPLASTY ANTERIOR APPROACH;  Surgeon: Jodi Geralds, MD;  Location: [facility];  Service: Orthopedics;  Laterality: Left;   TRIGGER FINGER RELEASE Right 05/18/2015   Procedure: RIGHT  LONG FINGER TRIGGER RELEASE ;  Surgeon: Mack Hook, MD;  Location: [facility];  Service: Orthopedics;  Laterality: Right;   Patient Active Problem List   Diagnosis Date Noted   S/P left mastectomy 12/12/2023   Breast cancer, left breast (HCC) 11/20/2023   Port-A-Cath in place 10/18/2023   Malignant neoplasm of upper inner  quadrant of female breast (HCC) 10/09/2023   Closed compression fracture of body of L1 vertebra (HCC) 10/05/2021   Compression fracture of L1 lumbar vertebra (HCC) 10/04/2021   Fall at home, initial encounter 10/04/2021   Hypokalemia 10/04/2021   Scalp laceration 10/04/2021   Leukocytosis 10/04/2021   Primary osteoarthritis of left hip 06/04/2021   Atypical ductal hyperplasia of right breast 03/19/2018   Obtundation 12/12/2017    PCP:   REFERRING PROVIDER: [name], MD  REFERRING DIAG: Left Breast Cancer  THERAPY DIAG:  Malignant neoplasm of upper-inner quadrant of left breast in female, estrogen receptor negative (HCC)  Abnormal posture  Stiffness of left shoulder, not elsewhere classified  Rationale for Evaluation and Treatment: Rehabilitation  ONSET DATE: 09/22/2023  SUBJECTIVE:                                                                                                                                                                                            SUBJECTIVE STATEMENT: 01/11/2024 I think the incision area is about the same. I have been doing the exercises. I think my ROM is better.    01/05/2024 I saw my PCP for the left axillary redness and drainage and she put me on antibiotics. I was already on Bactrim for my toe and she put me on vibramycin on Wednesday.  It is improved some, but its hard to tell. I didn't do the exercises I was given but I have been moving my arm a lot. I am really sore at my chest.  PERTINENT HISTORY:  Patient was diagnosed on 09/22/23 with left grade 3 IDC. It measures at least 5mm and is located in the upper inner quadrant. It is ER/PR neg, HER2+ with 1 metastatic lymph node. Pt had  neoadjuvant chemotherapy TCHP regimen 1/17-1/20/2025. She had significant side effects to chemotheray and did not wish to proceed. She underwent left lumpectomy with SLNB on 11/20/2023 with macrometastases of 2 of 4 LN's. She underwent a left  Mastectomy on 12/12/2023. Saw MD 01/03/2024 with left arm pit redness and  possible drainage.Other hx: Cervical Fusion . She has radiation simulation on April 15 and starts chemo on the 15th for a year. PATIENT GOALS:  Reassess how my recovery is going related to arm function, pain, and swelling.   PAIN:  Are you having pain? No, just discomfort  PRECAUTIONS: Recent Surgery, left UE Lymphedema risk,   RED FLAGS: None   ACTIVITY LEVEL / LEISURE: music, handbell choir, plays piano and organ   OBJECTIVE:   PATIENT SURVEYS:  QUICK DASH: 27%  OBSERVATIONS: Mastectomy incision with scabs still present, axillary incision with thickened yellow area of prior discharge. Took a sterile Q tip to try and clean the area but it was not budging with gentle pressure. Observed the area while pt did her exercises with no opening of the area noted.  POSTURE:  Forward head and rounded shoulders posture   LYMPHEDEMA ASSESSMENT:  UPPER EXTREMITY AROM/PROM:   A/PROM RIGHT   eval   RIGHT  Shoulder extension 50   Shoulder flexion 145   Shoulder abduction 165   Shoulder internal rotation 75   Shoulder external rotation 80                           (Blank rows = not tested)   A/PROM LEFT   eval LEFT 01/05/2024 LEFT 01/11/2024  Shoulder extension 60 60   Shoulder flexion 150 140 152  Shoulder abduction 160 130 156  Shoulder internal rotation 75 60 73  Shoulder external rotation 75 95                           (Blank rows = not tested)   CERVICAL AROM:     Percent limited  Flexion    Extension 50%  Right lateral flexion    Left lateral flexion    Right rotation 50%  Left rotation        UPPER EXTREMITY STRENGTH:    5/5 Lt                                                             5/5 Rt except for ER at 4/5   LYMPHEDEMA ASSESSMENTS (in cm):    LANDMARK RIGHT   eval  10 cm proximal to olecranon process 32  Olecranon process 28  10 cm proximal to ulnar styloid process 20.9  Just  proximal to ulnar styloid process 17.2  Across hand at thumb web space 19.3  At base of 2nd digit 6.6  (Blank rows = not tested)   LANDMARK LEFT   eval LEFT 01/05/2024  10 cm proximal to olecranon process 31.8 31.4  Olecranon process 28.5 27.1  10 cm proximal to ulnar styloid process 21.4 21.4  Just proximal to ulnar styloid process 17.8 16.5  Across hand at thumb web space 19.5 18.1  At base of 2nd digit 6.6 6.7  (Blank rows = not tested)      Surgery type/Date: left lumpectomy with SLNB on 11/20/2023 with macrometastasess of 2 of 4 LN's. She underwent a left Mastectomy on 12/12/2023 Number of lymph nodes removed: 2/4 Current/past treatment (chemo, radiation, hormone therapy): pending radiation and chemotherapy starting April 15th Other symptoms:  Heaviness/tightness Yestight Pain Nodiscomfort Pitting edema No Infections Yes  being treated with antibiotic Decreased scar mobility Yes Stemmer sign No  TREATMENT TODAY 01/11/2024 Measured AROM Left shoulder Checked breast incision;scabs still present;not ready for scar massage yet Axilllary incison continues with mucous type plug; seeing MD on Tues;advised to bring to their attention Showed pt radiation position and showed her how to simulate at home to get her arm ready for simulation Instructed in supine wand scaption x4 reps, standing pectoral stretch in corner and in doorway(preferred single arm in doorway), and standing lat stretch at counter. Performed each x 3 reps. Updated HEP with illustrated and written HEP. Discussed radiation and continuing exercises atleast 1x daily for duration and a month or so after to be sure shoulder is not getting tight. Also discussed compression bra to prevent swelling as long as her skin tolerates it. Advised pt to contact me with questions or concerns. Doing well, ready for release to HEP   PATIENT EDUCATION: Access Code: [email or web address] URL: [email or web address] Date:  01/11/2024 Prepared by: Alvira Monday  Exercises - Supine Shoulder Flexion Extension AAROM with Dowel  - 2 x daily - 7 x weekly - 1 sets - 5 reps - 5-10 hold - Corner Pec Major Stretch  - 1 x daily - 7 x weekly - 1 sets - 3 reps - 20 hold - Standing 'L' Stretch at Counter  - 2 x daily - 7 x weekly - 1 sets - 3 reps - 20 hold - Single Arm Doorway Pec Stretch at 90 Degrees Abduction  - 2 x daily - 7 x weekly - 1 sets - 3 reps - 20 hold     (FA:OZHY) Education details: Scar massage when incisions healed and no scabs, reviewed 4 post op exercises and had pt perform while I observed left axilla, return to walking when toe healed, SOZO screens,compression bra prn for swelling Person educated: Patient Education method: Explanation and Handouts Education comprehension: verbalized understanding and returned demonstration  HOME EXERCISE PROGRAM: Reviewed previously given post op HEP. Pt performed each x 5 new handout given  ASSESSMENT:  CLINICAL IMPRESSION: Pt was seen for 1 additional visit after re-eval to recheck ROM and instruct several more exercises. She has done very well with HEP and ROM is within 2 to 4 degrees of her baseline evaluation. We reviewed scar massage again and she will start when scabs are off. She will have MD check the Left axillary area where there is a thick yellow drainage. She was given several additional exercises to help make radiation simulation easier for her. She will continue her SOZO screens every 3 mos for 2 years and 6 mos for 2 years. She was advised to call with any questions or concerns. There are no further needs identified at present,, She is ready for discharge. Pt will benefit from skilled therapeutic intervention to improve on the following deficits: Decreased knowledge of precautions, impaired UE functional use, pain, decreased ROM, postural dysfunction.   PT treatment/interventions: ADL/Self care home management, [phone]- PT Re-evaluation,  97110-Therapeutic exercises, 97530- Therapeutic activity, [id number]- Neuromuscular re-education, 97535- Self Care, [id number]- Manual therapy, and 97760- Orthotic Fit/training   GOALS: Goals reviewed with patient? Yes  LONG TERM GOALS:  (STG=LTG)  GOALS Name Target Date  Goal status  1 Pt will demonstrate she has regained full shoulder ROM and function post operatively compared to baselines.  Baseline: 01/26/2024 MET within 2-4 degrees     PLAN:  PT FREQUENCY/DURATION: no further needs identified  PLAN FOR NEXT SESSION: Pt is discharged PHYSICAL THERAPY DISCHARGE SUMMARY  Visits from Start of Care: 3  Current functional level related to goals / functional outcomes: Achieved goal. Doing well with ROM   Remaining deficits: Yellow thick drainage at left axilla. Will have MD check   Education / Equipment: HEP, pt watched video   Patient agrees to discharge. Patient goals were met. Patient is being discharged due to meeting the stated rehab goals.    [facility]  [address], [address]  [place] [address]  [phone]  After Breast Cancer Class Video It is recommended you view the ABC class video to be educated on lymphedema risk reduction. This video lasts for about 30 minutes. It can be viewed on our website here: [email or web address]  Scar massage You can begin gentle scar massage to you incision sites. Gently place one hand on the incision and move the skin (without sliding on the skin) in various directions. Do this for a few minutes and then you can gently massage either coconut oil or vitamin E cream into the scars.  Compression garment You should continue wearing your compression bra until you feel like you no longer have swelling.  Home exercise Program Continue doing the exercises you were given until you feel like you can do them without feeling any tightness at the end.    Walking Program Studies show that 30 minutes of walking per day (fast enough to elevate your heart rate) can significantly reduce the risk of a cancer recurrence. If you can't walk due to other medical reasons, we encourage you to find another activity you could do (like a stationary bike or water exercise).  Posture After breast cancer surgery, people frequently sit with rounded shoulders posture because it puts their incisions on slack and feels better. If you sit like this and scar tissue forms in that position, you can become very tight and have pain sitting or standing with good posture. Try to be aware of your posture and sit and stand up tall to heal properly.  Follow up PT: It is recommended you return every 3 months for the first 3 years following surgery to be assessed on the SOZO machine for an L-Dex score. This helps prevent clinically significant lymphedema in 95% of patients. These follow up screens are 10 minute appointments that you are not billed for.  Waynette Buttery, PT 01/11/2024, 12:45 PM

## 2024-01-12 ENCOUNTER — Encounter: Payer: Self-pay | Admitting: Radiology

## 2024-01-12 ENCOUNTER — Ambulatory Visit
Admission: RE | Admit: 2024-01-12 | Discharge: 2024-01-12 | Disposition: A | Discharge status: Home / Self Care | Source: Ambulatory Visit | Attending: Radiology | Admitting: Radiology

## 2024-01-12 ENCOUNTER — Ambulatory Visit (HOSPITAL_COMMUNITY)
Admission: RE | Admit: 2024-01-12 | Discharge: 2024-01-12 | Disposition: A | Discharge status: Home / Self Care | Payer: HMO | Source: Ambulatory Visit | Attending: Hematology and Oncology | Admitting: Hematology and Oncology

## 2024-01-12 VITALS — Ht 65.0 in | Wt 189.0 lb

## 2024-01-12 DIAGNOSIS — Z171 Estrogen receptor negative status [ER-]: Secondary | ICD-10-CM

## 2024-01-12 DIAGNOSIS — Z7969 Long term (current) use of other immunomodulators and immunosuppressants: Secondary | ICD-10-CM | POA: Insufficient documentation

## 2024-01-12 DIAGNOSIS — Z0189 Encounter for other specified special examinations: Secondary | ICD-10-CM

## 2024-01-12 DIAGNOSIS — C50212 Malignant neoplasm of upper-inner quadrant of left female breast: Secondary | ICD-10-CM

## 2024-01-12 DIAGNOSIS — Z08 Encounter for follow-up examination after completed treatment for malignant neoplasm: Secondary | ICD-10-CM | POA: Diagnosis present

## 2024-01-12 LAB — ECHOCARDIOGRAM COMPLETE
AR max vel: 1.95 cm2
AV Area VTI: 1.97 cm2
AV Area mean vel: 2.05 cm2
AV Mean grad: 4 mmHg
AV Peak grad: 8.2 mmHg
Ao pk vel: 1.43 m/s
Area-P 1/2: 3.68 cm2
S' Lateral: 2.6 cm

## 2024-01-12 NOTE — Progress Notes (Signed)
  Echocardiogram 2D Echocardiogram has been performed.  Rebecca Steele Lekesha Claw 01/12/2024, 10:52 AM

## 2024-01-12 NOTE — Progress Notes (Signed)
 Radiation Oncology         [phone] ________________________________  Name: Rebecca Steele        MRN: [id number]  Date of Service: 01/12/2024 DOB: 01/15/1951  WU:[id number], Lynford Humphrey, MD  Rachel Moulds, MD     Initial Outpatient Follow-up - Conducted via telephone at patient request.    REFERRING PHYSICIAN: Rachel Moulds, MD   DIAGNOSIS: The encounter diagnosis was Malignant neoplasm of upper-inner quadrant of left breast in female, estrogen receptor negative (HCC).   Cancer Staging  Malignant neoplasm of upper inner quadrant of female breast [facility]) Staging form: Breast, AJCC 8th Edition - Pathologic stage from 11/24/2023: ypT2, pN1, cM0, G3, ER-, PR-, HER2+ - Signed by Rachel Moulds, MD on 11/24/2023 Stage prefix: Post-therapy Histologic grading system: 3 grade system   HISTORY OF PRESENT ILLNESS: Rebecca Steele is a 73 y.o. female seen as a follow-up new for her left breast cancer. Since the time of her original consultation, she underwent mastectomy with under the care of Dr. Dwain Sarna on 12/12/2023.  Surgical pathology revealed high grade invasive ductal carcinoma, involving areas adjacent to the previous lumpectomy site and associated high-grade ductal carcinoma in situ.  All resection margins were negative for carcinoma. One sampled intramammary lymph node was negative for carcinoma.  Today the patient reports to be healing well from the surgery. She notes a small white crusted lesion on her incision, but states this has been evaluated by Dr. Dwain Sarna and she was told it's normal and consistent with sloughing tissue. She denies any pain, swelling, or issues with left shoulder range of motion.    HPI from consult on 12/05/2023: The patient has noted a palpable mass in her left breast and a diagnostic mammography on 09/22/23 showed a mass in the left breast.  There were also 2 abnormal appearing left axillary lymph nodes and further diagnostic workup with ultrasound  located the mass in the left breast at 12:00 5 cm from the nipple injuring 2.7 cm.  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  She relocated her care to the [facility] system, and underwent Port-A-Cath placement on 10/12/2023.  She began systemic chemotherapy on 10/20/2023 but significantly struggled with side effects related to this and decided to proceed with surgery.  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  The tumor measured 3.6 cm but this was present in the lateral and posterior margins, less than 1 mm from the medial margin less than 1 mm from the anterior margin, 2 mm from the inferior and 3 mm from the superior margin, margins were free however of DCIS, angiolymphatic invasion was noted and further posterior and lateral margins were noted to have carcinoma within them.  Out of the 4 lymph nodes examined, 2 nodes had metastases.  Rather than undergoing reexcision of her margins, the patient is scheduled for a left mastectomy on 12/12/2023.  She met back with Dr. Al Pimple on 11/30/2023 and rather than going back to traditional chemotherapy, is pursuing Kadcyla.  She is seen to discuss postmastectomy radiation at the appropriate time.    PREVIOUS RADIATION THERAPY: No   PAST MEDICAL HISTORY:  Past Medical History:  Diagnosis Date   Breast mass, right    Cancer (HCC) 10/2023   left breast IDC with mets  to lymphnodes   Complication of anesthesia    states she has had 2 neck surgeries and her neck is stiff, hard to wake. states she was given gabapentin and fentanyl with breast surgery and was diff to wake up   Depression    History of kidney stones    Hypothyroidism    Mitral valve prolapse    Sleep apnea    HAS MILD OSA, NO CPAP NEEDED        PAST SURGICAL HISTORY: Past Surgical History:  Procedure Laterality Date   APPENDECTOMY     BREAST BIOPSY Right 09/16/2022   MM RT BREAST BX W LOC DEV 1ST LESION IMAGE BX SPEC STEREO GUIDE 09/16/2022 [facility]   BREAST BIOPSY  12/06/2022   MM RT RADIOACTIVE SEED LOC MAMMO GUIDE 12/06/2022 [facility]   BREAST BIOPSY Left 11/16/2023   [place] LT RADIOACTIVE SEED LOC 11/16/2023 [facility]   BREAST BIOPSY  11/16/2023   MM LT RADIOACTIVE SEED EA ADD LESION LOC MAMMO GUIDE 11/16/2023 [facility]   BREAST EXCISIONAL BIOPSY Right 2018   BREAST LUMPECTOMY WITH RADIOACTIVE SEED AND SENTINEL LYMPH NODE BIOPSY Left 11/20/2023   Procedure: LEFT BREAST SEED GUIDED LUMPECTOMY, LEFT AXILLARY SENTINEL NODE BIOPSY;  Surgeon: [name], MD;  Location: [facility];  Service: General;  Laterality: Left;  PEC BLOCK   CHOLECYSTECTOMY     CYSTOSCOPY W/ RETROGRADES     KYPHOPLASTY N/A 10/05/2021   Procedure: LUMBAR ONE KYPHOPLASTY;  Surgeon: Jadene Pierini, MD;  Location: [facility];  Service: Neurosurgery;  Laterality: N/A;   NECK SURGERY     PORTACATH PLACEMENT Right 10/12/2023   Procedure: INSERTION PORT-A-CATH WITH GUIDED ULTRASOUND;  Surgeon: [name], MD;  Location: [facility];  Service: General;  Laterality: Right;   RADIOACTIVE SEED GUIDED AXILLARY SENTINEL LYMPH NODE Left 11/20/2023   Procedure: LEFT AXILLARY NODE SEED GUIDED EXCISION;  Surgeon: [name], MD;  Location: [facility];  Service: General;  Laterality: Left;   RADIOACTIVE SEED GUIDED EXCISIONAL BREAST BIOPSY Right 12/12/2017   Procedure: RIGHT RADIOACTIVE SEED GUIDED EXCISIONAL BREAST BIOPSY ERAS PATHWAY;  Surgeon: [name], MD;  Location: [facility];  Service: General;  Laterality: Right;  LMA   RADIOACTIVE SEED GUIDED EXCISIONAL BREAST BIOPSY Right 12/07/2022   Procedure: RADIOACTIVE SEED GUIDED EXCISIONAL RIGHT BREAST  BIOPSY;  Surgeon: [name], MD;  Location: [facility];  Service: General;  Laterality: Right;   SHOULDER SURGERY     SIMPLE MASTECTOMY WITH AXILLARY SENTINEL NODE BIOPSY Left 12/12/2023   Procedure: LEFT  MASTECTOMY;  Surgeon: [name], MD;  Location: [facility];  Service: General;  Laterality: Left;   TONSILLECTOMY     TOTAL HIP ARTHROPLASTY Left 06/04/2021   Procedure: TOTAL HIP ARTHROPLASTY ANTERIOR APPROACH;  Surgeon: Jodi Geralds, MD;  Location: [facility];  Service: Orthopedics;  Laterality: Left;   TRIGGER FINGER RELEASE Right 05/18/2015   Procedure: RIGHT  LONG FINGER TRIGGER RELEASE ;  Surgeon: Mack Hook, MD;  Location:  SURGERY CENTER;  Service: Orthopedics;  Laterality: Right;     FAMILY HISTORY:  Family History  Problem Relation Age of Onset   Breast cancer Maternal Aunt        44s     SOCIAL HISTORY:  reports that she has never smoked. She has never used smokeless tobacco. She reports that she does not drink alcohol and does not use drugs.  The patient is married and lives  in [facility].  She is retired from being a Research officer, political party at [employer]. She and her husband pick up their grandson at school.    ALLERGIES: Gadolinium derivatives, Other, Valium, Paxlovid [nirmatrelvir-ritonavir], Doxycycline, Fentanyl, Iohexol, Macrodantin, Red dye #40 (allura red), Rocephin [ceftriaxone], and Zithromax [azithromycin]   MEDICATIONS:  Current Outpatient Medications  Medication Sig Dispense Refill   butalbital-acetaminophen-caffeine (FIORICET) 50-325-40 MG tablet Take 1 tablet by mouth 2 (two) times daily as needed for headache or migraine.     cholecalciferol (VITAMIN D3) 25 MCG (1000 UNIT) tablet Take 1,000 Units by mouth daily.     Evolocumab (REPATHA [place]) Inject into the skin.     levothyroxine (SYNTHROID) 25 MCG tablet Take 25 mcg by mouth daily before breakfast.     loratadine (CLARITIN) 10 MG  tablet Take 10 mg by mouth daily.     methocarbamol (ROBAXIN) 500 MG tablet Take 1 tablet (500 mg total) by mouth every 8 (eight) hours as needed for muscle spasms. 30 tablet 0   montelukast (SINGULAIR) 10 MG tablet Take 10 mg by mouth at bedtime.     mupirocin ointment (BACTROBAN) 2 % Apply 1 Application topically 2 (two) times daily. 22 g 0   sertraline (ZOLOFT) 50 MG tablet Take 50 mg by mouth daily.     traMADol (ULTRAM) 50 MG tablet Take 1 tablet (50 mg total) by mouth every 6 (six) hours as needed (mild pain). 10 tablet 0   traMADol (ULTRAM) 50 MG tablet Take 1 tablet (50 mg total) by mouth every 6 (six) hours as needed (mild pain). 10 tablet 0   traZODone (DESYREL) 100 MG tablet Take 100 mg by mouth at bedtime.     No current facility-administered medications for this encounter.     REVIEW OF SYSTEMS: Notable for that above.      PHYSICAL EXAM:  Wt Readings from Last 3 Encounters:  01/12/24 189 lb (85.7 kg)  12/12/23 190 lb 7.6 oz (86.4 kg)  12/05/23 189 lb 2 oz (85.8 kg)   Temp Readings from Last 3 Encounters:  12/13/23 98.1 F (36.7 C)  12/05/23 (!) 97.1 F (36.2 C) (Temporal)  11/30/23 (!) 97.2 F (36.2 C) (Temporal)   BP Readings from Last 3 Encounters:  12/13/23 127/85  12/05/23 138/85  11/30/23 137/74   Pulse Readings from Last 3 Encounters:  12/13/23 (!) 54  12/05/23 73  11/30/23 80    Deferred, due to nature of telephone visit.     ECOG = 1  0 - Asymptomatic (Fully active, able to carry on all predisease activities without restriction)  1 - Symptomatic but completely ambulatory (Restricted in physically strenuous activity but ambulatory and able to carry out work of a light or sedentary nature. For example, light housework, office work)  2 - Symptomatic, <50% in bed during the day (Ambulatory and capable of all self care but unable to carry out any work activities. Up and about more than 50% of waking hours)  3 - Symptomatic, >50% in bed, but not  bedbound (Capable of only limited self-care, confined to bed or chair 50% or more of waking hours)  4 - Bedbound (Completely disabled. Cannot carry on any self-care. Totally confined to bed or chair)  5 - Death   Santiago Glad MM, Creech RH, Tormey DC, et al. [phone]). "Toxicity and response criteria of the [facility]". Am. Evlyn Clines. Oncol. 5 (6): 649-55    LABORATORY DATA:  Lab Results  Component Value Date  WBC 9.5 11/02/2023   HGB 14.0 11/02/2023   HCT 41.5 11/02/2023   MCV 92.4 11/02/2023   PLT 127 (L) 11/02/2023   Lab Results  Component Value Date   NA 141 11/02/2023   K 3.8 11/02/2023   CL 110 11/02/2023   CO2 25 11/02/2023   Lab Results  Component Value Date   ALT 26 11/02/2023   AST 18 11/02/2023   ALKPHOS 96 11/02/2023   BILITOT 0.5 11/02/2023      RADIOGRAPHY: ECHOCARDIOGRAM COMPLETE Result Date: 01/12/2024    ECHOCARDIOGRAM REPORT   Patient Name:   DAVIONNE MASTRANGELO Date of Exam: 01/12/2024 Medical Rec #:  [id number]         Height:       65.0 in Accession #:    [id number]        Weight:       190.5 lb Date of Birth:  Dec 27, 1950        BSA:          1.938 m Patient Age:    72 years          BP:           147/78 mmHg Patient Gender: F                 HR:           69 bpm. Exam Location:  Inpatient Procedure: 2D Echo, 3D Echo, Cardiac Doppler, Color Doppler and Strain Analysis            (Both Spectral and Color Flow Doppler were utilized during            procedure). Indications:    Chemo  History:        Patient has prior history of Echocardiogram examinations, most                 recent 10/16/2023.  Sonographer:    Karma Ganja Referring Phys: [id number] PRAVEENA IRUKU  Sonographer Comments: Global longitudinal strain was attempted. IMPRESSIONS  1. Left ventricular ejection fraction, by estimation, is 55 to 60%. Left ventricular ejection fraction by 3D volume is 58 %. The left ventricle has normal function. The left ventricle has no regional wall motion  abnormalities. Left ventricular diastolic  parameters are consistent with Grade I diastolic dysfunction (impaired relaxation). The average left ventricular global longitudinal strain is -15.4 %. The global longitudinal strain is abnormal but tracking of lateral wall appeared suboptimal.  2. Right ventricular systolic function is normal. The right ventricular size is normal. There is normal pulmonary artery systolic pressure. The estimated right ventricular systolic pressure is 32.2 mmHg.  3. The mitral valve is normal in structure. Trivial mitral valve regurgitation. No evidence of mitral stenosis.  4. The aortic valve is tricuspid. There is mild calcification of the aortic valve. Aortic valve regurgitation is not visualized. No aortic stenosis is present.  5. The inferior vena cava is dilated in size with >50% respiratory variability, suggesting right atrial pressure of 8 mmHg. FINDINGS  Left Ventricle: Left ventricular ejection fraction, by estimation, is 55 to 60%. Left ventricular ejection fraction by 3D volume is 58 %. The left ventricle has normal function. The left ventricle has no regional wall motion abnormalities. The average left ventricular global longitudinal strain is -15.4 %. Strain was performed and the global longitudinal strain is abnormal. The left ventricular internal cavity size was normal in size. There is no left ventricular hypertrophy. Left ventricular diastolic parameters are consistent with Grade  I diastolic dysfunction (impaired relaxation). Right Ventricle: The right ventricular size is normal. No increase in right ventricular wall thickness. Right ventricular systolic function is normal. There is normal pulmonary artery systolic pressure. The tricuspid regurgitant velocity is 2.46 m/s, and  with an assumed right atrial pressure of 8 mmHg, the estimated right ventricular systolic pressure is 32.2 mmHg. Left Atrium: Left atrial size was normal in size. Right Atrium: Right atrial size was  normal in size. Pericardium: There is no evidence of pericardial effusion. Mitral Valve: The mitral valve is normal in structure. Trivial mitral valve regurgitation. No evidence of mitral valve stenosis. Tricuspid Valve: The tricuspid valve is normal in structure. Tricuspid valve regurgitation is mild. Aortic Valve: The aortic valve is tricuspid. There is mild calcification of the aortic valve. Aortic valve regurgitation is not visualized. No aortic stenosis is present. Aortic valve mean gradient measures 4.0 mmHg. Aortic valve peak gradient measures 8.2 mmHg. Aortic valve area, by VTI measures 1.97 cm. Pulmonic Valve: The pulmonic valve was normal in structure. Pulmonic valve regurgitation is not visualized. Aorta: The aortic root is normal in size and structure. Venous: The inferior vena cava is dilated in size with greater than 50% respiratory variability, suggesting right atrial pressure of 8 mmHg. IAS/Shunts: No atrial level shunt detected by color flow Doppler. Additional Comments: 3D was performed not requiring image post processing on an independent workstation and was normal.  LEFT VENTRICLE PLAX 2D LVIDd:         4.20 cm         Diastology LVIDs:         2.60 cm         LV e' medial:    6.74 cm/s LV PW:         1.00 cm         LV E/e' medial:  10.7 LV IVS:        0.90 cm         LV e' lateral:   6.31 cm/s LVOT diam:     1.90 cm         LV E/e' lateral: 11.5 LV SV:         58 LV SV Index:   30              2D Longitudinal LVOT Area:     2.84 cm        Strain                                2D Strain GLS   -15.4 %                                Avg:                                 3D Volume EF                                LV 3D EF:    Left  ventricul                                             ar                                             ejection                                             fraction                                             by 3D                                              volume is                                             58 %.                                 3D Volume EF:                                3D EF:        58 %                                LV EDV:       80 ml                                LV ESV:       33 ml                                LV SV:        46 ml RIGHT VENTRICLE            IVC RV Basal diam:  3.90 cm    IVC diam: 2.10 cm RV S prime:     9.57 cm/s TAPSE (M-mode): 1.8 cm LEFT ATRIUM             Index        RIGHT ATRIUM           Index LA diam:        3.70 cm 1.91 cm/m   RA Area:     17.00 cm LA Vol (A2C):   41.3 ml 21.32 ml/m  RA Volume:   45.70 ml  23.59 ml/m LA Vol (A4C):   32.3 ml  16.67 ml/m LA Biplane Vol: 35.0 ml 18.06 ml/m  AORTIC VALVE AV Area (Vmax):    1.95 cm AV Area (Vmean):   2.05 cm AV Area (VTI):     1.97 cm AV Vmax:           143.00 cm/s AV Vmean:          90.900 cm/s AV VTI:            0.297 m AV Peak Grad:      8.2 mmHg AV Mean Grad:      4.0 mmHg LVOT Vmax:         98.10 cm/s LVOT Vmean:        65.600 cm/s LVOT VTI:          0.206 m LVOT/AV VTI ratio: 0.69  AORTA Ao Root diam: 3.10 cm Ao Asc diam:  3.20 cm MITRAL VALVE               TRICUSPID VALVE MV Area (PHT): 3.68 cm    TR Peak grad:   24.2 mmHg MV Decel Time: 206 msec    TR Vmax:        246.00 cm/s MV E velocity: 72.40 cm/s MV A velocity: 81.00 cm/s  SHUNTS MV E/A ratio:  0.89        Systemic VTI:  0.21 m                            Systemic Diam: 1.90 cm Dalton McleanMD Electronically signed by Wilfred Lacy Signature Date/Time: 01/12/2024/10:59:05 AM    Final         IMPRESSION/PLAN: I spoke with the patient to conduct this consult visit via telephone. The patient was notified in advance and was offered an in person or telemedicine meeting to allow for face to face communication but instead preferred to proceed with a telephone consult.    1. 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 Patient will begin her first cycle of Kadcyla on 01/16/2024. We again discussed the rationale for adjuvant radiation to the chest wall and regional lymph nodes to reduce risks of local recurrence in her situation.  We discussed the risks, benefits, short, and long term effects of radiotherapy, as well as the curative intent, and the patient is interested in proceeding. We again discussed the delivery and logistics of radiotherapy and Dr. Mitzi Hansen anticipates a course of 6 1/2 weeks of radiotherapy to the left chest wall and regional lymph nodes with deep inspiration breath-hold technique. We will see her back a few weeks after surgery to discuss the simulation process and anticipate we starting radiotherapy about 4-6 weeks after surgery if she is ready to proceed based on healing.   She is scheduled for CT simulation on 01/16/2024 with her first treatment on 01/23/2024. A consent form has been reviewed and will be signed when the patient comes for CT simulation.   We look forward to participating in this patient's care.   This encounter was conducted via telephone.  The patient has provided two factor identification and has given verbal consent for this type of encounter and has been advised to only accept a meeting of this type in a secure network environment.  The time spent during this encounter was 30 minutes including preparation, discussion, and coordination of the patient's care  The attendants for this meeting  include Bryan Lemma PA-C and patient. During the encounter, Bryan Lemma PA-C was located at [facility] Radiation Oncology Department.  Patient was located at home.  In a visit lasting 60 minutes, greater than 50% of the time was spent face to face reviewing her case, as well as in preparation of, discussing, and coordinating the patient's care.  The above documentation reflects my direct findings during this  shared patient visit. Please see the separate note by Dr. Mitzi Hansen on this date for the remainder of the patient's plan of care.    Bryan Lemma, PA-C    **Disclaimer: This note was dictated with voice recognition software. Similar sounding words can inadvertently be transcribed and this note may contain transcription errors which may not have been corrected upon publication of note.**

## 2024-01-13 ENCOUNTER — Ambulatory Visit: Payer: HMO

## 2024-01-16 ENCOUNTER — Inpatient Hospital Stay: Payer: HMO | Attending: Hematology and Oncology

## 2024-01-16 ENCOUNTER — Other Ambulatory Visit: Payer: Self-pay | Admitting: Hematology and Oncology

## 2024-01-16 ENCOUNTER — Ambulatory Visit
Admission: RE | Admit: 2024-01-16 | Discharge: 2024-01-16 | Disposition: A | Discharge status: Home / Self Care | Source: Ambulatory Visit | Attending: Radiation Oncology | Admitting: Radiation Oncology

## 2024-01-16 ENCOUNTER — Inpatient Hospital Stay: Payer: HMO

## 2024-01-16 ENCOUNTER — Encounter: Payer: Self-pay | Admitting: Hematology and Oncology

## 2024-01-16 ENCOUNTER — Inpatient Hospital Stay (HOSPITAL_BASED_OUTPATIENT_CLINIC_OR_DEPARTMENT_OTHER): Payer: HMO | Admitting: Hematology and Oncology

## 2024-01-16 VITALS — BP 147/79 | HR 79 | Temp 97.4°F | Resp 17 | Wt 193.7 lb

## 2024-01-16 VITALS — BP 133/73 | HR 82 | Temp 97.9°F | Resp 18

## 2024-01-16 DIAGNOSIS — C50212 Malignant neoplasm of upper-inner quadrant of left female breast: Secondary | ICD-10-CM

## 2024-01-16 DIAGNOSIS — Z171 Estrogen receptor negative status [ER-]: Secondary | ICD-10-CM

## 2024-01-16 DIAGNOSIS — C773 Secondary and unspecified malignant neoplasm of axilla and upper limb lymph nodes: Secondary | ICD-10-CM | POA: Insufficient documentation

## 2024-01-16 DIAGNOSIS — Z510 Encounter for antineoplastic radiation therapy: Secondary | ICD-10-CM | POA: Diagnosis present

## 2024-01-16 DIAGNOSIS — Z1731 Human epidermal growth factor receptor 2 positive status: Secondary | ICD-10-CM | POA: Insufficient documentation

## 2024-01-16 DIAGNOSIS — Z1722 Progesterone receptor negative status: Secondary | ICD-10-CM | POA: Insufficient documentation

## 2024-01-16 DIAGNOSIS — Z79899 Other long term (current) drug therapy: Secondary | ICD-10-CM | POA: Insufficient documentation

## 2024-01-16 DIAGNOSIS — Z5112 Encounter for antineoplastic immunotherapy: Secondary | ICD-10-CM | POA: Insufficient documentation

## 2024-01-16 DIAGNOSIS — Z95828 Presence of other vascular implants and grafts: Secondary | ICD-10-CM

## 2024-01-16 LAB — CMP (CANCER CENTER ONLY)
ALT: 20 U/L (ref 0–44)
AST: 17 U/L (ref 15–41)
Albumin: 4.1 g/dL (ref 3.5–5.0)
Alkaline Phosphatase: 86 U/L (ref 38–126)
Anion gap: 4 — ABNORMAL LOW (ref 5–15)
BUN: 13 mg/dL (ref 8–23)
CO2: 28 mmol/L (ref 22–32)
Calcium: 9.5 mg/dL (ref 8.9–10.3)
Chloride: 110 mmol/L (ref 98–111)
Creatinine: 0.9 mg/dL (ref 0.44–1.00)
GFR, Estimated: >60 mL/min (ref >60)
Glucose, Bld: 96 mg/dL (ref 70–99)
Potassium: 3.9 mmol/L (ref 3.5–5.1)
Sodium: 142 mmol/L (ref 135–145)
Total Bilirubin: 0.6 mg/dL (ref 0.0–1.2)
Total Protein: 6.3 g/dL — ABNORMAL LOW (ref 6.5–8.1)

## 2024-01-16 LAB — CBC WITH DIFFERENTIAL (CANCER CENTER ONLY)
Abs Immature Granulocytes: 0.02 10*3/uL (ref 0.00–0.07)
Basophils Absolute: 0.1 10*3/uL (ref 0.0–0.1)
Basophils Relative: 2 %
Eosinophils Absolute: 0.2 10*3/uL (ref 0.0–0.5)
Eosinophils Relative: 4 %
HCT: 42.2 % (ref 36.0–46.0)
Hemoglobin: 14.2 g/dL (ref 12.0–15.0)
Immature Granulocytes: 0 %
Lymphocytes Relative: 24 %
Lymphs Abs: 1.3 10*3/uL (ref 0.7–4.0)
MCH: 31.4 pg (ref 26.0–34.0)
MCHC: 33.6 g/dL (ref 30.0–36.0)
MCV: 93.4 fL (ref 80.0–100.0)
Monocytes Absolute: 0.5 10*3/uL (ref 0.1–1.0)
Monocytes Relative: 10 %
Neutro Abs: 3.3 10*3/uL (ref 1.7–7.7)
Neutrophils Relative %: 60 %
Platelet Count: 208 10*3/uL (ref 150–400)
RBC: 4.52 MIL/uL (ref 3.87–5.11)
RDW: 12.9 % (ref 11.5–15.5)
WBC Count: 5.5 10*3/uL (ref 4.0–10.5)
nRBC: 0 % (ref 0.0–0.2)

## 2024-01-16 MED ORDER — DIPHENHYDRAMINE HCL 25 MG PO CAPS: 25.0000 mg | ORAL_CAPSULE | Freq: Once | ORAL | Status: DC

## 2024-01-16 MED ORDER — PROCHLORPERAZINE MALEATE 10 MG PO TABS: 10.0000 mg | ORAL_TABLET | Freq: Four times a day (QID) | ORAL | 1 refills | Status: DC | PRN

## 2024-01-16 MED ORDER — HEPARIN SOD (PORK) LOCK FLUSH 100 UNIT/ML IV SOLN
500.0000 [IU] | Freq: Once | INTRAVENOUS | Status: AC | PRN
  Administered 2024-01-16: 500 [IU]

## 2024-01-16 MED ORDER — SODIUM CHLORIDE 0.9% FLUSH
10.0000 mL | INTRAVENOUS | Status: DC | PRN
  Administered 2024-01-16: 10 mL

## 2024-01-16 MED ORDER — CETIRIZINE HCL 10 MG PO TABS
10.0000 mg | ORAL_TABLET | Freq: Once | ORAL | Status: AC
  Administered 2024-01-16: 10 mg via ORAL
  Filled 2024-01-16: qty 1

## 2024-01-16 MED ORDER — ADO-TRASTUZUMAB EMTANSINE CHEMO INJECTION 160 MG
3.6000 mg/kg | Freq: Once | INTRAVENOUS | Status: AC
  Administered 2024-01-16: 320 mg via INTRAVENOUS
  Filled 2024-01-16: qty 16

## 2024-01-16 MED ORDER — ACETAMINOPHEN 325 MG PO TABS
650.0000 mg | ORAL_TABLET | Freq: Once | ORAL | Status: AC
  Administered 2024-01-16: 650 mg via ORAL
  Filled 2024-01-16: qty 2

## 2024-01-16 MED ORDER — SODIUM CHLORIDE 0.9% FLUSH
10.0000 mL | Freq: Once | INTRAVENOUS | Status: AC
  Administered 2024-01-16: 10 mL

## 2024-01-16 MED ORDER — ONDANSETRON HCL 8 MG PO TABS: 8.0000 mg | ORAL_TABLET | Freq: Three times a day (TID) | ORAL | 1 refills | Status: DC | PRN

## 2024-01-16 MED ORDER — LIDOCAINE-PRILOCAINE 2.5-2.5 % EX CREA: TOPICAL_CREAM | CUTANEOUS | 3 refills | Status: DC

## 2024-01-16 MED ORDER — SODIUM CHLORIDE 0.9 % IV SOLN: INTRAVENOUS | Status: DC

## 2024-01-16 NOTE — Progress Notes (Signed)
 Cetirizine subbed for diphenhydramine d/t pt allergy to red dye.  Heidi Lemay, PharmD, MBA

## 2024-01-16 NOTE — Assessment & Plan Note (Signed)
 This is a very pleasant 73 year old postmenopausal female patient with  Left breast IDC, ER PR neg, her 2 positive attempted neoadj TCHP, only could tolerate one cycle, then went on to surgery now on adj kadcyla.  Assessment and Plan Assessment & Plan Breast Cancer Today is first cycle of Kadcyla. 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 Explained importance of early symptom reporting for treatment adjustment. - Administer Kadcyla. - Monitor for neuropathy, especially in hands. - Instruct to report persistent numbness or tingling. - Consider Herceptin and pertuzumab if significant neuropathy develops. - Prescribe Compazine and Zofran for nausea as needed. - Ensure lidocaine cream for port site use.  Wound Healing Small area with yellow slough, no infection signs. Previous oozing improved with antibiotics. - Monitor for infection signs, such as foul-smelling discharge. - Ensure adequate nutrition, rest, and sleep.  Follow-up Scheduled follow-up appointments to monitor condition and treatment progress. - Schedule follow-up with oncologist on May 6th. - Schedule next treatment session on May 7th. - Plan to see Dr. Delane Fear in six months.

## 2024-01-16 NOTE — Patient Instructions (Signed)
 [facility] - A DEPT OF [facility]  Discharge Instructions: Thank you for choosing [facility] to provide your oncology and hematology care.   If you have a lab appointment with the Cancer Center, please go directly to the Cancer Center and check in at the registration area.   Wear comfortable clothing and clothing appropriate for easy access to any Portacath or PICC line.   We strive to give you quality time with your provider. You may need to reschedule your appointment if you arrive late (15 or more minutes).  Arriving late affects you and other patients whose appointments are after yours.  Also, if you miss three or more appointments without notifying the office, you may be dismissed from the clinic at the provider's discretion.      For prescription refill requests, have your pharmacy contact our office and allow 72 hours for refills to be completed.    Today you received the following chemotherapy and/or immunotherapy agents Ado-Trastuzumab Emtansine (Kadcyla)   To help prevent nausea and vomiting after your treatment, we encourage you to take your nausea medication as directed.  BELOW ARE SYMPTOMS THAT SHOULD BE REPORTED IMMEDIATELY: *FEVER GREATER THAN 100.4 F (38 C) OR HIGHER *CHILLS OR SWEATING *NAUSEA AND VOMITING THAT IS NOT CONTROLLED WITH YOUR NAUSEA MEDICATION *UNUSUAL SHORTNESS OF BREATH *UNUSUAL BRUISING OR BLEEDING *URINARY PROBLEMS (pain or burning when urinating, or frequent urination) *BOWEL PROBLEMS (unusual diarrhea, constipation, pain near the anus) TENDERNESS IN MOUTH AND THROAT WITH OR WITHOUT PRESENCE OF ULCERS (sore throat, sores in mouth, or a toothache) UNUSUAL RASH, SWELLING OR PAIN  UNUSUAL VAGINAL DISCHARGE OR ITCHING   Items with * indicate a potential emergency and should be followed up as soon as possible or go to the Emergency Department if any problems should occur.  Please show the CHEMOTHERAPY ALERT  CARD or IMMUNOTHERAPY ALERT CARD at check-in to the Emergency Department and triage nurse.  Should you have questions after your visit or need to cancel or reschedule your appointment, please contact [facility] - A DEPT OF [facility]  Dept: [phone]  and follow the prompts.  Office hours are 8:00 a.m. to 4:30 p.m. Monday - Friday. Please note that voicemails left after 4:00 p.m. may not be returned until the following business day.  We are closed weekends and major holidays. You have access to a nurse at all times for urgent questions. Please call the main number to the clinic Dept: [phone] and follow the prompts.   For any non-urgent questions, you may also contact your provider using MyChart. We now offer e-Visits for anyone 39 and older to request care online for non-urgent symptoms. For details visit [email or web address].   Also download the MyChart app! Go to the app store, search "MyChart", open the app, select [place], and log in with your MyChart username and password.   Ado-Trastuzumab Emtansine (Kadcyla) Injection What is this medication? ADO-TRASTUZUMAB EMTANSINE (ADD oh traz TOO zuh mab em TAN zine) treats breast cancer. It works by blocking a protein that causes cancer cells to grow and multiply. This helps to slow or stop the spread of cancer cells. This medicine may be used for other purposes; ask your health care provider or pharmacist if you have questions. COMMON BRAND NAME(S): Kadcyla What should I tell my care team before I take this medication? They need to know if you have any of these  conditions: Heart failure Liver disease Low platelet levels Lung disease Tingling of the fingers or toes or other nerve disorder An unusual or allergic reaction to ado-trastuzumab emtansine, other medications, foods, dyes, or preservatives Pregnant or trying to get pregnant Breast-feeding How should I use this medication? This medication  is infused into a vein. It is given by your care team in a hospital or clinic setting. Talk to your care team about the use of this medication in children. Special care may be needed. Overdosage: If you think you have taken too much of this medicine contact a poison control center or emergency room at once. NOTE: This medicine is only for you. Do not share this medicine with others. What if I miss a dose? Keep appointments for follow-up doses. It is important not to miss your dose. Call your care team if you are unable to keep an appointment. What may interact with this medication? Atazanavir Boceprevir Clarithromycin Dalfopristin; quinupristin Delavirdine Indinavir Isoniazid, INH Itraconazole Ketoconazole Nefazodone Nelfinavir Ritonavir Telaprevir Telithromycin Tipranavir Voriconazole This list may not describe all possible interactions. Give your health care provider a list of all the medicines, herbs, non-prescription drugs, or dietary supplements you use. Also tell them if you smoke, drink alcohol, or use illegal drugs. Some items may interact with your medicine. What should I watch for while using this medication? This medication may make you feel generally unwell. This is not uncommon, as chemotherapy can affect healthy cells as well as cancer cells. Report any side effects. Continue your course of treatment even though you feel ill unless your care team tells you to stop. You may need blood work while taking this medication. This medication may increase your risk to bruise or bleed. Call your care team if you notice any unusual bleeding. Be careful brushing or flossing your teeth or using a toothpick because you may get an infection or bleed more easily. If you have any dental work done, tell your dentist you are receiving this medication. Talk to your care team if you may be pregnant. Serious birth defects can occur if you take this medication during pregnancy and for 7 months  after the last dose. You will need a negative pregnancy test before starting this medication. Contraception is recommended while taking this medication and for 7 months after the last dose. Your care team can help you find the option that works for you. If your partner can get pregnant, use a condom during sex while taking this medication and for 4 months after the last dose. Do not breastfeed while taking this medication and for 7 months after the last dose. This medication may cause infertility. Talk to your care team if you are concerned with your fertility. What side effects may I notice from receiving this medication? Side effects that you should report to your care team as soon as possible: Allergic reactions--skin rash, itching, hives, swelling of the face, lips, tongue, or throat Bleeding--bloody or black, tar-like stools, vomiting blood or brown material that looks like coffee grounds, red or dark brown urine, small red or purple spots on skin, unusual bruising or bleeding Dry cough, shortness of breath or trouble breathing Heart failure--shortness of breath, swelling of the ankles, feet, or hands, sudden weight gain, unusual weakness or fatigue Infusion reactions--chest pain, shortness of breath or trouble breathing, feeling faint or lightheaded Liver injury--right upper belly pain, loss of appetite, nausea, light-colored stool, dark yellow or brown urine, yellowing skin or eyes, unusual weakness or fatigue Pain, tingling,  or numbness in the hands or feet Painful swelling, warmth, or redness of the skin, blisters or sores at the infusion site Side effects that usually do not require medical attention (report to your care team if they continue or are bothersome): Constipation Fatigue Headache Muscle pain Nausea This list may not describe all possible side effects. Call your doctor for medical advice about side effects. You may report side effects to FDA at 1-800-FDA-1088. Where should I  keep my medication? This medication is given in a hospital or clinic. It will not be stored at home. NOTE: This sheet is a summary. It may not cover all possible information. If you have questions about this medicine, talk to your doctor, pharmacist, or health care provider.  2024 Elsevier/Gold Standard (2022-02-04 00:00:00)

## 2024-01-16 NOTE — Progress Notes (Signed)
 [facility] CONSULT NOTE  Patient Care Team: Genelle Gather, MD as PCP - General (Internal Medicine) [name], MD as Consulting Physician (General Surgery) Harold Hedge, MD as Consulting Physician (Obstetrics and Gynecology) Dorena Cookey, MD (Inactive) as Consulting Physician (Gastroenterology) Rachel Moulds, MD as Consulting Physician (Hematology and Oncology) Pershing Proud, RN as Oncology Nurse Navigator Donnelly Angelica, RN as Oncology Nurse Navigator  CHIEF COMPLAINTS/PURPOSE OF CONSULTATION:  Newly diagnosed breast cancer  HISTORY OF PRESENTING ILLNESS:   Rebecca Steele 73 y.o. female is here because of recent diagnosis of left breast cancer  I reviewed her records extensively and collaborated the history with the patient.  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)  09/22/2023 Mammogram   Patient self palpated a breast mass in her left breast around November 2019 went for diagnostic mammogram, this showed there is a developing density on mammogram, 2 of the lower left axillary lymph nodes exhibit increased cortical thickness, ultrasound confirmed a 2.7 cm irregular hypoechoic mass in left breast at 12:00 5 cm from the nipple along with abnormal left axillary lymph   09/29/2023 Pathology Results   Left breast central superior needle core biopsy 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)   10/20/2023 - 10/23/2023 Chemotherapy   Patient is on Treatment Plan : BREAST  Docetaxel + Carboplatin + Trastuzumab + Pertuzumab  (TCHP) q21d      11/24/2023 Cancer Staging   Staging form: Breast, AJCC 8th Edition -  Pathologic stage from 11/24/2023: ypT2, pN1, cM0, G3, ER-, PR-, HER2+ - Signed by Rachel Moulds, MD on 11/24/2023 Stage prefix: Post-therapy Histologic grading system: 3 grade system   01/16/2024 -  Chemotherapy   Patient is on Treatment Plan : BREAST ADO-Trastuzumab Emtansine (Kadcyla) q21d       She had left breast lumpectomy which showed Invasive poorly differentiated adenocarcinoma, grade 3, focal high grade DCIS, solid type without necrosis. Tumor measures 3.6 cms in greatest dimension.Invasive tumor in lateral and posterior margins. Pos lateral margin, 2/4 LN with macromets, one with ITC. She is scheduled for re excision on 12/12/2023.  Discussed the use of AI scribe software for clinical note transcription with the patient, who gave verbal consent to proceed.  History of Present Illness    Discussed the use of AI scribe software for clinical note transcription with the patient, who gave verbal consent to proceed.  History of Present Illness [name] is a 73 year old female who presents for her first cycle of Kadcyla treatment.  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 She is aware that fatigue, diarrhea, and neuropathy are possible side effects.  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 She has full range of motion and has been attending physical therapy.  She has a history of chemotherapy, which previously caused significant illness. She is currently not on any new medications but has been prescribed Compazine and Zofran for nausea, to be used as needed.  She reports a small area on her skin, left  mastectomy that is still healing, which was previously oozing and was treated with antibiotics prescribed by her PCP. The area is not currently oozing but has some yellow  slough.  She is eating well and maintaining good nutrition, which she acknowledges is important for healing. No issues with bowel movements or urination, and her hair is starting to grow back slightly.   Rest of the pertinent 10 point ROS reviewed and neg.  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 states she was given gabapentin and fentanyl with breast surgery and was diff to wake up   Depression    History of kidney stones    Hypothyroidism    Mitral valve prolapse    Sleep apnea    HAS MILD OSA, NO CPAP NEEDED    SURGICAL HISTORY: Past Surgical History:  Procedure Laterality Date   APPENDECTOMY     BREAST BIOPSY Right 09/16/2022   MM RT BREAST BX W LOC DEV 1ST LESION IMAGE BX SPEC STEREO GUIDE 09/16/2022 [facility]   BREAST BIOPSY  12/06/2022   MM RT RADIOACTIVE SEED LOC MAMMO GUIDE 12/06/2022 [facility]   BREAST BIOPSY Left 11/16/2023   [place] LT RADIOACTIVE SEED LOC 11/16/2023 [facility]   BREAST BIOPSY  11/16/2023   MM LT RADIOACTIVE SEED EA ADD LESION LOC MAMMO GUIDE 11/16/2023 [facility]   BREAST EXCISIONAL BIOPSY Right 2018   BREAST LUMPECTOMY WITH RADIOACTIVE SEED AND SENTINEL LYMPH NODE BIOPSY Left 11/20/2023   Procedure: LEFT BREAST SEED GUIDED LUMPECTOMY, LEFT AXILLARY SENTINEL NODE BIOPSY;  Surgeon: [name], MD;  Location: [facility];  Service: General;  Laterality: Left;  PEC BLOCK   CHOLECYSTECTOMY     CYSTOSCOPY W/ RETROGRADES     KYPHOPLASTY N/A 10/05/2021   Procedure: LUMBAR ONE KYPHOPLASTY;  Surgeon: Jadene Pierini, MD;  Location: [facility];  Service: Neurosurgery;  Laterality: N/A;   NECK SURGERY     PORTACATH PLACEMENT Right 10/12/2023   Procedure: INSERTION PORT-A-CATH WITH GUIDED ULTRASOUND;  Surgeon:  [name], MD;  Location: [facility];  Service: General;  Laterality: Right;   RADIOACTIVE SEED GUIDED AXILLARY SENTINEL LYMPH NODE Left 11/20/2023   Procedure: LEFT AXILLARY NODE SEED GUIDED EXCISION;  Surgeon: [name], MD;  Location: [facility];  Service: General;  Laterality: Left;   RADIOACTIVE SEED GUIDED EXCISIONAL BREAST BIOPSY Right 12/12/2017   Procedure: RIGHT RADIOACTIVE SEED GUIDED EXCISIONAL BREAST BIOPSY ERAS PATHWAY;  Surgeon: [name], MD;  Location: [facility];  Service: General;  Laterality: Right;  LMA   RADIOACTIVE SEED GUIDED EXCISIONAL BREAST BIOPSY Right 12/07/2022   Procedure: RADIOACTIVE SEED GUIDED EXCISIONAL RIGHT BREAST BIOPSY;  Surgeon: [name], MD;  Location: [facility];  Service: General;  Laterality: Right;   SHOULDER SURGERY     SIMPLE MASTECTOMY WITH AXILLARY SENTINEL NODE BIOPSY Left 12/12/2023   Procedure: LEFT  MASTECTOMY;  Surgeon: [name], MD;  Location: [facility];  Service: General;  Laterality: Left;   TONSILLECTOMY     TOTAL HIP ARTHROPLASTY Left 06/04/2021   Procedure: TOTAL HIP ARTHROPLASTY ANTERIOR APPROACH;  Surgeon: Jodi Geralds, MD;  Location: [facility];  Service: Orthopedics;  Laterality: Left;   TRIGGER FINGER RELEASE  Right 05/18/2015   Procedure: RIGHT  LONG FINGER TRIGGER RELEASE ;  Surgeon: Rober Chimera, MD;  Location: [facility];  Service: Orthopedics;  Laterality: Right;    SOCIAL HISTORY: Social History   Socioeconomic History   Marital status: Married    Spouse name: Not on file   Number of children: Not on file   Years of education: Not on file   Highest education level: Not on file  Occupational History   Not on file  Tobacco Use   Smoking status: Never   Smokeless tobacco: Never  Vaping Use   Vaping status: Never Used  Substance and Sexual Activity   Alcohol use: No   Drug use: No    Sexual activity: Not Currently    Birth control/protection: Post-menopausal  Other Topics Concern   Not on file  Social History Narrative   Not on file   Social Drivers of Health   Financial Resource Strain: Low Risk  (01/01/2024)   Received from [facility]   Overall Financial Resource Strain (CARDIA)    Difficulty of Paying Living Expenses: Not hard at all  Food Insecurity: No Food Insecurity (01/12/2024)   Hunger Vital Sign    Worried About Running Out of Food in the Last Year: Never true    Ran Out of Food in the Last Year: Never true  Transportation Needs: No Transportation Needs (01/01/2024)   Received from [facility] - Transportation    Lack of Transportation (Medical): No    Lack of Transportation (Non-Medical): No  Physical Activity: Inactive (04/24/2023)   Received from [facility]   Exercise Vital Sign    Days of Exercise per Week: 0 days    Minutes of Exercise per Session: 10 min  Stress: No Stress Concern Present (04/24/2023)   Received from [facility] - Occupational Stress Questionnaire    Feeling of Stress : Not at all  Social Connections: Socially Integrated (04/24/2023)   Received from [facility]   Social Network    How would you rate your social network (family, work, friends)?: Good participation with social networks  Intimate Partner Violence: Not At Risk (12/05/2023)   Humiliation, Afraid, Rape, and Kick questionnaire    Fear of Current or Ex-Partner: No    Emotionally Abused: No    Physically Abused: No    Sexually Abused: No    FAMILY HISTORY: Family History  Problem Relation Age of Onset   Breast cancer Maternal Aunt        51s    ALLERGIES:  is allergic to gadolinium derivatives, other, valium, paxlovid [nirmatrelvir-ritonavir], doxycycline, fentanyl, iohexol, macrodantin, red dye #40 (allura red), rocephin [ceftriaxone], and zithromax [azithromycin].  MEDICATIONS:  Current  Outpatient Medications  Medication Sig Dispense Refill   butalbital-acetaminophen-caffeine (FIORICET) 50-325-40 MG tablet Take 1 tablet by mouth 2 (two) times daily as needed for headache or migraine.     levothyroxine (SYNTHROID) 25 MCG tablet Take 25 mcg by mouth daily before breakfast.     lidocaine-prilocaine (EMLA) cream Apply to affected area once 30 g 3   loratadine (CLARITIN) 10 MG tablet Take 10 mg by mouth daily.     methocarbamol (ROBAXIN) 500 MG tablet Take 1 tablet (500 mg total) by mouth every 8 (eight) hours as needed for muscle spasms. 30 tablet 0   montelukast (SINGULAIR) 10 MG tablet Take 10 mg by mouth at bedtime.     mupirocin ointment (BACTROBAN) 2 %  Apply 1 Application topically 2 (two) times daily. 22 g 0   ondansetron (ZOFRAN) 8 MG tablet Take 1 tablet (8 mg total) by mouth every 8 (eight) hours as needed for nausea or vomiting. 30 tablet 1   prochlorperazine (COMPAZINE) 10 MG tablet Take 1 tablet (10 mg total) by mouth every 6 (six) hours as needed for nausea or vomiting. 30 tablet 1   sertraline (ZOLOFT) 50 MG tablet Take 50 mg by mouth daily.     traMADol (ULTRAM) 50 MG tablet Take 1 tablet (50 mg total) by mouth every 6 (six) hours as needed (mild pain). 10 tablet 0   traMADol (ULTRAM) 50 MG tablet Take 1 tablet (50 mg total) by mouth every 6 (six) hours as needed (mild pain). 10 tablet 0   traZODone (DESYREL) 100 MG tablet Take 100 mg by mouth at bedtime.     No current facility-administered medications for this visit.   Facility-Administered Medications Ordered in Other Visits  Medication Dose Route Frequency Provider Last Rate Last Admin   0.9 %  sodium chloride infusion   Intravenous Continuous Treasa Bradshaw, MD 10 mL/hr at 01/16/24 1127 New Bag at 01/16/24 1127   ado-trastuzumab emtansine (KADCYLA) 320 mg in sodium chloride 0.9 % 250 mL chemo infusion  3.6 mg/kg (Treatment Plan Recorded) Intravenous Once Rachel Moulds, MD 177 mL/hr at 01/16/24 1221 320 mg at  01/16/24 1221   heparin lock flush 100 unit/mL  500 Units Intracatheter Once PRN Kesha Hurrell, Burnice Logan, MD       sodium chloride flush (NS) 0.9 % injection 10 mL  10 mL Intracatheter PRN Katara Griner, MD        REVIEW OF SYSTEMS:   Constitutional: Denies fevers, chills or abnormal night sweats Eyes: Denies blurriness of vision, double vision or watery eyes Ears, nose, mouth, throat, and face: Denies mucositis or sore throat Respiratory: Denies cough, dyspnea or wheezes Cardiovascular: Denies palpitation, chest discomfort or lower extremity swelling Gastrointestinal:  Denies nausea, heartburn or change in bowel habits Skin: Denies abnormal skin rashes Lymphatics: Denies new lymphadenopathy or easy bruising Neurological:Denies numbness, tingling or new weaknesses Behavioral/Psych: Mood is stable, no new changes  Breast: As mentioned above All other systems were reviewed with the patient and are negative.  PHYSICAL EXAMINATION: ECOG PERFORMANCE STATUS: 0 - Asymptomatic  Vitals:   01/16/24 1038  BP: (!) 147/79  Pulse: 79  Resp: 17  Temp: (!) 97.4 F (36.3 C)  SpO2: 98%     Filed Weights   01/16/24 1038  Weight: 193 lb 11.2 oz (87.9 kg)    GENERAL appears well. No acute distress. Left mastectomy site with some slough and minor wound dehiscence. No concern for infection  LABORATORY DATA:  I have reviewed the data as listed Lab Results  Component Value Date   WBC 5.5 01/16/2024   HGB 14.2 01/16/2024   HCT 42.2 01/16/2024   MCV 93.4 01/16/2024   PLT 208 01/16/2024   Lab Results  Component Value Date   NA 142 01/16/2024   K 3.9 01/16/2024   CL 110 01/16/2024   CO2 28 01/16/2024    RADIOGRAPHIC STUDIES: I have personally reviewed the radiological reports and agreed with the findings in the report.  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  Assessment and Plan Assessment & Plan Breast Cancer Today is first cycle of Kadcyla. 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 Explained importance of early symptom reporting for treatment adjustment. - Administer Kadcyla. - Monitor for neuropathy, especially in hands. - Instruct to report persistent numbness or tingling. - Consider Herceptin and pertuzumab if significant neuropathy develops. - Prescribe Compazine and Zofran for nausea as needed. - Ensure lidocaine cream for port site use.  Wound Healing Small area with yellow slough, no infection signs. Previous oozing improved with antibiotics. - Monitor for infection signs, such as foul-smelling discharge. - Ensure adequate nutrition, rest, and sleep.  Follow-up Scheduled follow-up appointments to monitor condition and treatment progress. - Schedule follow-up with oncologist on May 6th. - Schedule next treatment session on May 7th. - Plan to see Dr. Delane Fear in six months.    Total time spent: 30 min including history, exam, discussion of options, review of records, counseling and coordination of care  All questions were answered. The patient knows to call the clinic with any problems, questions or concerns.    Murleen Arms, MD 01/16/24

## 2024-01-17 ENCOUNTER — Telehealth: Payer: Self-pay

## 2024-01-17 NOTE — Telephone Encounter (Signed)
-----   Message from Nurse Brigido Canales sent at 01/16/2024  3:43 PM EDT ----- Regarding: Dr. Maryalice Smaller 1st tx f/u call Dr. Maryalice Smaller 1st tx f/u call - Kadcyla - tolerated well

## 2024-01-17 NOTE — Telephone Encounter (Signed)
 Rebecca Steele states that she is doing fine. She is eating, drinking, and urinating well. She knows to call the office at [phone] if  she has any questions or concerns.

## 2024-01-20 ENCOUNTER — Encounter: Payer: Self-pay | Admitting: Hematology and Oncology

## 2024-01-21 ENCOUNTER — Encounter (HOSPITAL_BASED_OUTPATIENT_CLINIC_OR_DEPARTMENT_OTHER): Payer: Self-pay

## 2024-01-21 ENCOUNTER — Other Ambulatory Visit: Payer: Self-pay

## 2024-01-21 ENCOUNTER — Emergency Department (HOSPITAL_BASED_OUTPATIENT_CLINIC_OR_DEPARTMENT_OTHER)
Admission: EM | Admit: 2024-01-21 | Discharge: 2024-01-21 | Disposition: A | Discharge status: Home / Self Care | Attending: Emergency Medicine | Admitting: Emergency Medicine

## 2024-01-21 DIAGNOSIS — E876 Hypokalemia: Secondary | ICD-10-CM | POA: Diagnosis not present

## 2024-01-21 DIAGNOSIS — Z853 Personal history of malignant neoplasm of breast: Secondary | ICD-10-CM | POA: Insufficient documentation

## 2024-01-21 DIAGNOSIS — L089 Local infection of the skin and subcutaneous tissue, unspecified: Secondary | ICD-10-CM | POA: Insufficient documentation

## 2024-01-21 LAB — CBC WITH DIFFERENTIAL/PLATELET
Abs Immature Granulocytes: 0.02 10*3/uL (ref 0.00–0.07)
Basophils Absolute: 0.1 10*3/uL (ref 0.0–0.1)
Basophils Relative: 1 %
Eosinophils Absolute: 0.2 10*3/uL (ref 0.0–0.5)
Eosinophils Relative: 3 %
HCT: 42.1 % (ref 36.0–46.0)
Hemoglobin: 14.2 g/dL (ref 12.0–15.0)
Immature Granulocytes: 0 %
Lymphocytes Relative: 24 %
Lymphs Abs: 1.6 10*3/uL (ref 0.7–4.0)
MCH: 31.3 pg (ref 26.0–34.0)
MCHC: 33.7 g/dL (ref 30.0–36.0)
MCV: 92.9 fL (ref 80.0–100.0)
Monocytes Absolute: 1.1 10*3/uL — ABNORMAL HIGH (ref 0.1–1.0)
Monocytes Relative: 17 %
Neutro Abs: 3.6 10*3/uL (ref 1.7–7.7)
Neutrophils Relative %: 55 %
Platelets: 64 10*3/uL — ABNORMAL LOW (ref 150–400)
RBC: 4.53 MIL/uL (ref 3.87–5.11)
RDW: 12.9 % (ref 11.5–15.5)
WBC: 6.7 10*3/uL (ref 4.0–10.5)
nRBC: 0 % (ref 0.0–0.2)

## 2024-01-21 LAB — BASIC METABOLIC PANEL WITH GFR
Anion gap: 9 (ref 5–15)
BUN: 10 mg/dL (ref 8–23)
CO2: 24 mmol/L (ref 22–32)
Calcium: 9.6 mg/dL (ref 8.9–10.3)
Chloride: 106 mmol/L (ref 98–111)
Creatinine, Ser: 0.92 mg/dL (ref 0.44–1.00)
GFR, Estimated: >60 mL/min (ref >60)
Glucose, Bld: 134 mg/dL — ABNORMAL HIGH (ref 70–99)
Potassium: 3.2 mmol/L — ABNORMAL LOW (ref 3.5–5.1)
Sodium: 139 mmol/L (ref 135–145)

## 2024-01-21 LAB — LACTIC ACID, PLASMA: Lactic Acid, Venous: 1.4 mmol/L (ref 0.5–1.9)

## 2024-01-21 MED ORDER — HEPARIN SOD (PORK) LOCK FLUSH 100 UNIT/ML IV SOLN
500.0000 [IU] | Freq: Once | INTRAVENOUS | Status: AC
Start: 2024-01-21 — End: 2024-01-21
  Administered 2024-01-21: 500 [IU]

## 2024-01-21 MED ORDER — POTASSIUM CHLORIDE CRYS ER 20 MEQ PO TBCR
40.0000 meq | EXTENDED_RELEASE_TABLET | Freq: Once | ORAL | Status: AC
  Administered 2024-01-21: 40 meq via ORAL
  Filled 2024-01-21: qty 2

## 2024-01-21 MED ORDER — SULFAMETHOXAZOLE-TRIMETHOPRIM 800-160 MG PO TABS: 1.0000 | ORAL_TABLET | Freq: Two times a day (BID) | ORAL | 0 refills | Status: AC

## 2024-01-21 MED ORDER — METHYLPREDNISOLONE SODIUM SUCC 40 MG IJ SOLR: 40.0000 mg | Freq: Once | INTRAMUSCULAR | Status: DC

## 2024-01-21 MED ORDER — POTASSIUM CHLORIDE ER 10 MEQ PO TBCR: 10.0000 meq | EXTENDED_RELEASE_TABLET | Freq: Every day | ORAL | 0 refills | Status: DC

## 2024-01-21 MED ORDER — DIPHENHYDRAMINE HCL 25 MG PO CAPS: 50.0000 mg | ORAL_CAPSULE | Freq: Once | ORAL | Status: DC

## 2024-01-21 MED ORDER — SULFAMETHOXAZOLE-TRIMETHOPRIM 800-160 MG PO TABS
1.0000 | ORAL_TABLET | Freq: Once | ORAL | Status: AC
  Administered 2024-01-21: 1 via ORAL
  Filled 2024-01-21: qty 1

## 2024-01-21 MED ORDER — DIPHENHYDRAMINE HCL 50 MG/ML IJ SOLN: 50.0000 mg | Freq: Once | INTRAMUSCULAR | Status: DC

## 2024-01-21 NOTE — ED Triage Notes (Addendum)
 Pt presents with complains of "open wound". Pt states she had a lumpectomy 2/17. Has open area at surgical site under left arm  Currently on chemo

## 2024-01-21 NOTE — ED Provider Notes (Signed)
 [place] [facility]ERGENCY DEPARTMENT AT [facility] Provider Note   CSN: [id number] Arrival date & time: 01/21/24  1639     History  Chief Complaint  Patient presents with   Wound Check    Rebecca Steele is a 73 y.o. female with history of breast cancer status postlumpectomy 11/20/2023 currently on chemo with last infusion being 01/16/2024 presented for wound to see notes.  Patient states that she first noticed on Tuesday after her chemo he was told to keep an eye on it.  Patient states last night she started to see pus in the area but denies any fevers pain or redness in the area.  Patient is not currently on antibiotics for this.  Patient's surgeon is Dr. Delane Fear.  Patient denies nausea vomiting, chest pain or shortness of breath, abdominal pain.  Home Medications Prior to Admission medications   Medication Sig Start Date End Date Taking? Authorizing Provider  sulfamethoxazole -trimethoprim  (BACTRIM  DS) 800-160 MG tablet Take 1 tablet by mouth 2 (two) times daily for 7 days. 01/21/24 01/28/24 Yes Rebecca Steele, Rebecca Benes, Rebecca Steele  butalbital -acetaminophen -caffeine  (FIORICET) 50-325-40 MG tablet Take 1 tablet by mouth 2 (two) times daily as needed for headache or migraine.    [provider]  levothyroxine  (SYNTHROID ) 25 MCG tablet Take 25 mcg by mouth daily before breakfast.    [provider]  lidocaine -prilocaine  (EMLA ) cream Apply to affected area once 01/16/24   Iruku, Praveena, MD  loratadine  (CLARITIN ) 10 MG tablet Take 10 mg by mouth daily.    [provider]  methocarbamol  (ROBAXIN ) 500 MG tablet Take 1 tablet (500 mg total) by mouth every 8 (eight) hours as needed for muscle spasms. 12/13/23   Enid Harry, MD  montelukast  (SINGULAIR ) 10 MG tablet Take 10 mg by mouth at bedtime.    [provider]  mupirocin  ointment (BACTROBAN ) 2 % Apply 1 Application topically 2 (two) times daily. 11/02/23   Iruku, Praveena, MD  ondansetron  (ZOFRAN ) 8 MG  tablet Take 1 tablet (8 mg total) by mouth every 8 (eight) hours as needed for nausea or vomiting. 01/16/24   Iruku, Praveena, MD  prochlorperazine  (COMPAZINE ) 10 MG tablet Take 1 tablet (10 mg total) by mouth every 6 (six) hours as needed for nausea or vomiting. 01/16/24   Iruku, Praveena, MD  sertraline  (ZOLOFT ) 50 MG tablet Take 50 mg by mouth daily.    [provider]  traMADol  (ULTRAM ) 50 MG tablet Take 1 tablet (50 mg total) by mouth every 6 (six) hours as needed (mild pain). 11/21/23   Enid Harry, MD  traMADol  (ULTRAM ) 50 MG tablet Take 1 tablet (50 mg total) by mouth every 6 (six) hours as needed (mild pain). 12/13/23   Enid Harry, MD  traZODone  (DESYREL ) 100 MG tablet Take 100 mg by mouth at bedtime.    [provider]      Allergies    Gadolinium derivatives, Other, Valium, Paxlovid [nirmatrelvir-ritonavir], Doxycycline , Fentanyl , Iohexol , Macrodantin, Red dye #40 (allura red), Rocephin [ceftriaxone], and Zithromax [azithromycin]    Review of Systems   Review of Systems  Physical Exam Updated Vital Signs BP (!) 156/88 (BP Location: Right Arm)   Pulse 92   Temp 98.7 F (37.1 C) (Oral)   Resp 16   SpO2 93%  Physical Exam Constitutional:      General: She is not in acute distress. Cardiovascular:     Rate and Rhythm: Normal rate and regular rhythm.     Pulses: Normal pulses.  Heart sounds: Normal heart sounds.  Pulmonary:     Effort: Pulmonary effort is normal. No respiratory distress.     Breath sounds: Normal breath sounds.  Skin:    Comments: And left axilla there does appear to be some purulent material with wound opening present No surrounding warmth erythema, unable to express out any purulent material but can clearly see purulent material within the wound No areas of fluctuance noted  Neurological:     Mental Status: She is alert.     ED Results / Procedures / Treatments   Labs (all labs ordered are listed, but only abnormal  results are displayed) Labs Reviewed  BASIC METABOLIC PANEL WITH GFR - Abnormal; Notable for the following components:      Result Value   Potassium 3.2 (*)    Glucose, Bld 134 (*)    All other components within normal limits  CULTURE, BLOOD (SINGLE)  LACTIC ACID, PLASMA  CBC WITH DIFFERENTIAL/PLATELET  CBC WITH DIFFERENTIAL/PLATELET    EKG None  Radiology No results found.  Procedures Procedures    Medications Ordered in ED Medications  potassium chloride  SA (KLOR-CON  M) CR tablet 40 mEq (40 mEq Oral Given 01/21/24 1833)  sulfamethoxazole -trimethoprim  (BACTRIM  DS) 800-160 MG per tablet 1 tablet (1 tablet Oral Given 01/21/24 1833)    ED Course/ Medical Decision Making/ A&P Clinical Course as of 01/21/24 1837  Sun Jan 21, 2024  1812 This is a 73 year old female presenting to the ED with concern for mild wound dehiscence under her left axilla at the site of lumpectomy performed approximately 2 months ago, and also some purulent drainage.  She has been on doxycycline  through her doctor earlier this month for similar concern for drainage near the site.  She denies fevers at home but reports that she occasionally will get "chills".  She is currently on chemotherapy.  She has a chest port as well.  On exam she is well-appearing, afebrile, mildly hypertensive.  She has very mild dehiscence approximately 2 cm of the lateral aspect of the left subaxillary incision site, with no underlying fluctuance or erythema.  She does report pain shooting into her left arm for the past 2 days with movement of the arm.  We will check basic blood work including lactate, CBC.  Consider CT imaging with contrast to evaluate for underlying or deep space infection.  The patient does have reported iodine  allergies although this is in the distant past, reported a "rash, and is agreeable to premedication here to obtain scan [MT]    Clinical Course User Index [MT] Trifan, Janalyn Me, MD                                  Medical Decision Making Amount and/or Complexity of Data Reviewed Labs: ordered. Radiology: ordered.  Risk Prescription drug management.   [name] 73 y.o. presented today for wound check. Working DDx that I considered at this time includes, but not limited to, infection, intrathoracic abscess, postop complication, sepsis.  R/o DDx: Pending  Review of prior external notes: 01/03/24 office visit  Unique Tests and My Independent Interpretation:  CBC: Pending BMP: Mild hypokalemia 3.2 Lactic acid: 1.4 Blood cultures: Pending CT chest with contrast:Pending  Social Determinants of Health: none  Discussion with Independent Historian:  husband  Discussion of Management of Tests:  Elvan Hamel, MD General Surgery  Risk: Medium: prescription drug management  Risk Stratification Score: None  Staffed  with Gordon Latus, MD  EMERGENCY DEPARTMENT  US  GUIDANCE EXAM Emergency Ultrasound:  US  Guidance for Needle Guidance  INDICATIONS: Difficult vascular access Linear probe used in real-time to visualize location of needle entry through skin.   PERFORMED BY: Myself IMAGES ARCHIVED?: Yes LIMITATIONS: None VIEWS USED: Transverse INTERPRETATION: Right arm   Plan: On exam patient was no acute distress stable vitals.  On exam patient does have a wound that has started to separate with some purulent material present.  Patient did not endorse any infectious symptoms with me however with the attending stated that she was having rigors and shooting pain in her left arm because of this.  Will get basic labs along with 1 set of blood cultures and consult general surgery for recommendations.  Patient unfortunately cannot have a CT scan without being pretreated with medicine as she has anaphylactic allergy.  Upon chart review patient did see her primary care provider at the beginning this month and was placed on Doxy and states that this did not help.  Now the patient has failed antibiotics  attending and I both feel the patient does need imaging of her chest to rule out an abscess or surgical complication.  Will pretreat patient with steroids and Benadryl  and order CT chest with contrast.  I spoke to the patient extensively about the pretreatment process and patient states that she has not been pretreated in the past however is in agreement with this.  I spoke with the general surgeon and he states that he can get the patient in tomorrow to the urgent office to be evaluated if the workup is negative and to give her Bactrim  in the meantime if we are to discharge her.  Ultrasound IV was placed with visualization of the IV within the vein however the line blew.  After discussion with the attending we will cancel the imaging and have patient follow-up pending labs as per the general surgeons plan with Bactrim .  Patient signed out to Gordon Latus, MD.  Please review their note for the continuation of patient's care.  The plan at this point is follow-up on labs if negative can discharge with Bactrim  with close follow-up tomorrow.  This chart was dictated using voice recognition software.  Despite best efforts to proofread,  errors can occur which can change the documentation meaning.         Final Clinical Impression(s) / ED Diagnoses Final diagnoses:  Wound infection  Hypokalemia    Rx / DC Orders ED Discharge Orders          Ordered    sulfamethoxazole -trimethoprim  (BACTRIM  DS) 800-160 MG tablet  2 times daily        01/21/24 1824              Elex Grimmer 01/21/24 1837    Arvilla Birmingham, MD 01/21/24 [phone]

## 2024-01-21 NOTE — Discharge Instructions (Addendum)
 Please go to your general surgeon tomorrow as they are expecting you.  You may have an infection and so we have placed you on Bactrim  as an antibiotic  Please take this medication as prescribed and if symptoms change or worsen in the meantime please return to the ER.

## 2024-01-22 NOTE — ED Notes (Addendum)
 Patient Port de accessed after Heparin  injected. (See MAR). Port site clean and intact.Covered port with gauze. Patient tolerated well.

## 2024-01-23 ENCOUNTER — Ambulatory Visit: Admitting: Radiation Oncology

## 2024-01-23 ENCOUNTER — Encounter: Payer: Self-pay | Admitting: *Deleted

## 2024-01-23 DIAGNOSIS — Z510 Encounter for antineoplastic radiation therapy: Secondary | ICD-10-CM | POA: Diagnosis not present

## 2024-01-24 ENCOUNTER — Ambulatory Visit

## 2024-01-25 ENCOUNTER — Ambulatory Visit

## 2024-01-26 ENCOUNTER — Ambulatory Visit

## 2024-01-26 LAB — CULTURE, BLOOD (SINGLE)
Culture: NO GROWTH
Special Requests: ADEQUATE

## 2024-01-29 ENCOUNTER — Ambulatory Visit

## 2024-01-29 ENCOUNTER — Telehealth: Payer: Self-pay | Admitting: *Deleted

## 2024-01-30 ENCOUNTER — Ambulatory Visit

## 2024-01-30 ENCOUNTER — Other Ambulatory Visit: Payer: Self-pay | Admitting: *Deleted

## 2024-01-30 ENCOUNTER — Inpatient Hospital Stay

## 2024-01-30 ENCOUNTER — Encounter: Payer: Self-pay | Admitting: Hematology and Oncology

## 2024-01-30 ENCOUNTER — Ambulatory Visit
Admission: RE | Admit: 2024-01-30 | Discharge: 2024-01-30 | Disposition: A | Discharge status: Home / Self Care | Source: Ambulatory Visit | Attending: Radiation Oncology | Admitting: Radiation Oncology

## 2024-01-30 DIAGNOSIS — Z1731 Human epidermal growth factor receptor 2 positive status: Secondary | ICD-10-CM | POA: Insufficient documentation

## 2024-01-30 DIAGNOSIS — Z79899 Other long term (current) drug therapy: Secondary | ICD-10-CM | POA: Diagnosis not present

## 2024-01-30 DIAGNOSIS — C773 Secondary and unspecified malignant neoplasm of axilla and upper limb lymph nodes: Secondary | ICD-10-CM | POA: Insufficient documentation

## 2024-01-30 DIAGNOSIS — Z171 Estrogen receptor negative status [ER-]: Secondary | ICD-10-CM

## 2024-01-30 DIAGNOSIS — Z5112 Encounter for antineoplastic immunotherapy: Secondary | ICD-10-CM | POA: Insufficient documentation

## 2024-01-30 DIAGNOSIS — Z1722 Progesterone receptor negative status: Secondary | ICD-10-CM | POA: Insufficient documentation

## 2024-01-30 DIAGNOSIS — C50212 Malignant neoplasm of upper-inner quadrant of left female breast: Secondary | ICD-10-CM | POA: Insufficient documentation

## 2024-01-30 DIAGNOSIS — Z95828 Presence of other vascular implants and grafts: Secondary | ICD-10-CM

## 2024-01-30 LAB — CBC WITH DIFFERENTIAL (CANCER CENTER ONLY)
Abs Immature Granulocytes: 0.01 10*3/uL (ref 0.00–0.07)
Basophils Absolute: 0.1 10*3/uL (ref 0.0–0.1)
Basophils Relative: 2 %
Eosinophils Absolute: 0.2 10*3/uL (ref 0.0–0.5)
Eosinophils Relative: 4 %
HCT: 41.3 % (ref 36.0–46.0)
Hemoglobin: 14 g/dL (ref 12.0–15.0)
Immature Granulocytes: 0 %
Lymphocytes Relative: 26 %
Lymphs Abs: 1.3 10*3/uL (ref 0.7–4.0)
MCH: 31.2 pg (ref 26.0–34.0)
MCHC: 33.9 g/dL (ref 30.0–36.0)
MCV: 92 fL (ref 80.0–100.0)
Monocytes Absolute: 0.6 10*3/uL (ref 0.1–1.0)
Monocytes Relative: 11 %
Neutro Abs: 2.8 10*3/uL (ref 1.7–7.7)
Neutrophils Relative %: 57 %
Platelet Count: 217 10*3/uL (ref 150–400)
RBC: 4.49 MIL/uL (ref 3.87–5.11)
RDW: 13.1 % (ref 11.5–15.5)
WBC Count: 4.9 10*3/uL (ref 4.0–10.5)
nRBC: 0 % (ref 0.0–0.2)

## 2024-01-30 LAB — CMP (CANCER CENTER ONLY)
ALT: 25 U/L (ref 0–44)
AST: 29 U/L (ref 15–41)
Albumin: 4.1 g/dL (ref 3.5–5.0)
Alkaline Phosphatase: 84 U/L (ref 38–126)
Anion gap: 8 (ref 5–15)
BUN: 11 mg/dL (ref 8–23)
CO2: 27 mmol/L (ref 22–32)
Calcium: 9.6 mg/dL (ref 8.9–10.3)
Chloride: 107 mmol/L (ref 98–111)
Creatinine: 1.01 mg/dL — ABNORMAL HIGH (ref 0.44–1.00)
GFR, Estimated: 59 mL/min — ABNORMAL LOW (ref >60)
Glucose, Bld: 139 mg/dL — ABNORMAL HIGH (ref 70–99)
Potassium: 3.5 mmol/L (ref 3.5–5.1)
Sodium: 142 mmol/L (ref 135–145)
Total Bilirubin: 0.6 mg/dL (ref 0.0–1.2)
Total Protein: 6.5 g/dL (ref 6.5–8.1)

## 2024-01-30 MED ORDER — SODIUM CHLORIDE 0.9% FLUSH
10.0000 mL | Freq: Once | INTRAVENOUS | Status: AC
  Administered 2024-01-30: 10 mL

## 2024-01-30 MED ORDER — HEPARIN SOD (PORK) LOCK FLUSH 100 UNIT/ML IV SOLN
500.0000 [IU] | Freq: Once | INTRAVENOUS | Status: AC
  Administered 2024-01-30: 500 [IU]

## 2024-01-30 NOTE — Progress Notes (Signed)
 I saw the patient today and evaluated her left mastectomy scar at the level of the axilla. There is about 1 cm of separation and evidence of local treatment with silver nitrate. No erythema is noted. We discussed the rationale to hold radiation until the site has healed a bit more and we will move her start date to 02/12/24, but she will let us  know if she's made more progress in healing prior to then or if alternatively that the site hasn't improved by that time.

## 2024-01-30 NOTE — Telephone Encounter (Signed)
 No entry

## 2024-01-31 ENCOUNTER — Ambulatory Visit

## 2024-01-31 ENCOUNTER — Other Ambulatory Visit: Payer: Self-pay

## 2024-02-01 ENCOUNTER — Ambulatory Visit

## 2024-02-02 ENCOUNTER — Ambulatory Visit

## 2024-02-05 ENCOUNTER — Ambulatory Visit

## 2024-02-06 ENCOUNTER — Ambulatory Visit

## 2024-02-06 ENCOUNTER — Inpatient Hospital Stay (HOSPITAL_BASED_OUTPATIENT_CLINIC_OR_DEPARTMENT_OTHER): Payer: HMO | Admitting: Hematology and Oncology

## 2024-02-06 ENCOUNTER — Inpatient Hospital Stay: Payer: HMO

## 2024-02-06 ENCOUNTER — Inpatient Hospital Stay: Payer: HMO | Attending: Hematology and Oncology

## 2024-02-06 VITALS — BP 150/68 | HR 84 | Resp 16

## 2024-02-06 VITALS — BP 147/71 | HR 86 | Temp 97.2°F | Resp 16 | Wt 192.2 lb

## 2024-02-06 DIAGNOSIS — Z79899 Other long term (current) drug therapy: Secondary | ICD-10-CM | POA: Diagnosis not present

## 2024-02-06 DIAGNOSIS — C50212 Malignant neoplasm of upper-inner quadrant of left female breast: Secondary | ICD-10-CM

## 2024-02-06 DIAGNOSIS — Z95828 Presence of other vascular implants and grafts: Secondary | ICD-10-CM

## 2024-02-06 DIAGNOSIS — E876 Hypokalemia: Secondary | ICD-10-CM

## 2024-02-06 DIAGNOSIS — Z1722 Progesterone receptor negative status: Secondary | ICD-10-CM | POA: Diagnosis not present

## 2024-02-06 DIAGNOSIS — Z1731 Human epidermal growth factor receptor 2 positive status: Secondary | ICD-10-CM | POA: Diagnosis not present

## 2024-02-06 DIAGNOSIS — C773 Secondary and unspecified malignant neoplasm of axilla and upper limb lymph nodes: Secondary | ICD-10-CM | POA: Insufficient documentation

## 2024-02-06 DIAGNOSIS — S32010A Wedge compression fracture of first lumbar vertebra, initial encounter for closed fracture: Secondary | ICD-10-CM

## 2024-02-06 DIAGNOSIS — Z171 Estrogen receptor negative status [ER-]: Secondary | ICD-10-CM | POA: Diagnosis not present

## 2024-02-06 DIAGNOSIS — D72829 Elevated white blood cell count, unspecified: Secondary | ICD-10-CM

## 2024-02-06 DIAGNOSIS — N6091 Unspecified benign mammary dysplasia of right breast: Secondary | ICD-10-CM

## 2024-02-06 DIAGNOSIS — R401 Stupor: Secondary | ICD-10-CM

## 2024-02-06 DIAGNOSIS — Z5112 Encounter for antineoplastic immunotherapy: Secondary | ICD-10-CM | POA: Insufficient documentation

## 2024-02-06 DIAGNOSIS — Y92009 Unspecified place in unspecified non-institutional (private) residence as the place of occurrence of the external cause: Secondary | ICD-10-CM

## 2024-02-06 DIAGNOSIS — M1612 Unilateral primary osteoarthritis, left hip: Secondary | ICD-10-CM

## 2024-02-06 LAB — CBC WITH DIFFERENTIAL (CANCER CENTER ONLY)
Abs Immature Granulocytes: 0.01 10*3/uL (ref 0.00–0.07)
Basophils Absolute: 0.1 10*3/uL (ref 0.0–0.1)
Basophils Relative: 2 %
Eosinophils Absolute: 0.2 10*3/uL (ref 0.0–0.5)
Eosinophils Relative: 4 %
HCT: 41.9 % (ref 36.0–46.0)
Hemoglobin: 14.2 g/dL (ref 12.0–15.0)
Immature Granulocytes: 0 %
Lymphocytes Relative: 25 %
Lymphs Abs: 1.3 10*3/uL (ref 0.7–4.0)
MCH: 31 pg (ref 26.0–34.0)
MCHC: 33.9 g/dL (ref 30.0–36.0)
MCV: 91.5 fL (ref 80.0–100.0)
Monocytes Absolute: 0.6 10*3/uL (ref 0.1–1.0)
Monocytes Relative: 11 %
Neutro Abs: 3 10*3/uL (ref 1.7–7.7)
Neutrophils Relative %: 58 %
Platelet Count: 258 10*3/uL (ref 150–400)
RBC: 4.58 MIL/uL (ref 3.87–5.11)
RDW: 13.1 % (ref 11.5–15.5)
WBC Count: 5.1 10*3/uL (ref 4.0–10.5)
nRBC: 0 % (ref 0.0–0.2)

## 2024-02-06 LAB — CMP (CANCER CENTER ONLY)
ALT: 23 U/L (ref 0–44)
AST: 23 U/L (ref 15–41)
Albumin: 4.1 g/dL (ref 3.5–5.0)
Alkaline Phosphatase: 82 U/L (ref 38–126)
Anion gap: 3 — ABNORMAL LOW (ref 5–15)
BUN: 14 mg/dL (ref 8–23)
CO2: 30 mmol/L (ref 22–32)
Calcium: 9.7 mg/dL (ref 8.9–10.3)
Chloride: 109 mmol/L (ref 98–111)
Creatinine: 0.83 mg/dL (ref 0.44–1.00)
GFR, Estimated: >60 mL/min (ref >60)
Glucose, Bld: 89 mg/dL (ref 70–99)
Potassium: 3.7 mmol/L (ref 3.5–5.1)
Sodium: 142 mmol/L (ref 135–145)
Total Bilirubin: 0.6 mg/dL (ref 0.0–1.2)
Total Protein: 6.4 g/dL — ABNORMAL LOW (ref 6.5–8.1)

## 2024-02-06 MED ORDER — SODIUM CHLORIDE 0.9% FLUSH
10.0000 mL | INTRAVENOUS | Status: DC | PRN
  Administered 2024-02-06: 10 mL

## 2024-02-06 MED ORDER — SODIUM CHLORIDE 0.9% FLUSH
10.0000 mL | Freq: Once | INTRAVENOUS | Status: AC
Start: 2024-02-06 — End: 2024-02-06
  Administered 2024-02-06: 10 mL

## 2024-02-06 MED ORDER — CETIRIZINE HCL 10 MG PO TABS
10.0000 mg | ORAL_TABLET | Freq: Once | ORAL | Status: AC
  Administered 2024-02-06: 10 mg via ORAL
  Filled 2024-02-06: qty 1

## 2024-02-06 MED ORDER — SODIUM CHLORIDE 0.9 % IV SOLN
3.6000 mg/kg | Freq: Once | INTRAVENOUS | Status: AC
  Administered 2024-02-06: 320 mg via INTRAVENOUS
  Filled 2024-02-06: qty 16

## 2024-02-06 MED ORDER — ACETAMINOPHEN 325 MG PO TABS
650.0000 mg | ORAL_TABLET | Freq: Once | ORAL | Status: AC
  Administered 2024-02-06: 650 mg via ORAL
  Filled 2024-02-06: qty 2

## 2024-02-06 MED ORDER — SODIUM CHLORIDE 0.9 % IV SOLN: INTRAVENOUS | Status: DC

## 2024-02-06 MED ORDER — HEPARIN SOD (PORK) LOCK FLUSH 100 UNIT/ML IV SOLN
500.0000 [IU] | Freq: Once | INTRAVENOUS | Status: AC | PRN
  Administered 2024-02-06: 500 [IU]

## 2024-02-06 NOTE — Progress Notes (Signed)
 Pt observed for 30 minutes post Kadcyla infusion. Pt tolerated Tx well w/out incident. VSS at discharge.  Ambulatory to lobby.

## 2024-02-06 NOTE — Patient Instructions (Signed)
 [facility] - A DEPT OF [facility]  Discharge Instructions: Thank you for choosing [facility] to provide your oncology and hematology care.   If you have a lab appointment with the Cancer Center, please go directly to the Cancer Center and check in at the registration area.   Wear comfortable clothing and clothing appropriate for easy access to any Portacath or PICC line.   We strive to give you quality time with your provider. You may need to reschedule your appointment if you arrive late (15 or more minutes).  Arriving late affects you and other patients whose appointments are after yours.  Also, if you miss three or more appointments without notifying the office, you may be dismissed from the clinic at the provider's discretion.      For prescription refill requests, have your pharmacy contact our office and allow 72 hours for refills to be completed.    Today you received the following chemotherapy and/or immunotherapy agents: Kadcyla      To help prevent nausea and vomiting after your treatment, we encourage you to take your nausea medication as directed.  BELOW ARE SYMPTOMS THAT SHOULD BE REPORTED IMMEDIATELY: *FEVER GREATER THAN 100.4 F (38 C) OR HIGHER *CHILLS OR SWEATING *NAUSEA AND VOMITING THAT IS NOT CONTROLLED WITH YOUR NAUSEA MEDICATION *UNUSUAL SHORTNESS OF BREATH *UNUSUAL BRUISING OR BLEEDING *URINARY PROBLEMS (pain or burning when urinating, or frequent urination) *BOWEL PROBLEMS (unusual diarrhea, constipation, pain near the anus) TENDERNESS IN MOUTH AND THROAT WITH OR WITHOUT PRESENCE OF ULCERS (sore throat, sores in mouth, or a toothache) UNUSUAL RASH, SWELLING OR PAIN  UNUSUAL VAGINAL DISCHARGE OR ITCHING   Items with * indicate a potential emergency and should be followed up as soon as possible or go to the Emergency Department if any problems should occur.  Please show the CHEMOTHERAPY ALERT CARD or IMMUNOTHERAPY  ALERT CARD at check-in to the Emergency Department and triage nurse.  Should you have questions after your visit or need to cancel or reschedule your appointment, please contact [facility] - A DEPT OF [facility]  Dept: [phone]  and follow the prompts.  Office hours are 8:00 a.m. to 4:30 p.m. Monday - Friday. Please note that voicemails left after 4:00 p.m. may not be returned until the following business day.  We are closed weekends and major holidays. You have access to a nurse at all times for urgent questions. Please call the main number to the clinic Dept: [phone] and follow the prompts.   For any non-urgent questions, you may also contact your provider using MyChart. We now offer e-Visits for anyone 29 and older to request care online for non-urgent symptoms. For details visit [email or web address].   Also download the MyChart app! Go to the app store, search "MyChart", open the app, select , and log in with your MyChart username and password.

## 2024-02-06 NOTE — Progress Notes (Signed)
 [facility] CONSULT NOTE  Patient Care Team: Abran Jon CROME, MD as PCP - General (Internal Medicine) Ebbie Cough, MD as Consulting Physician (General Surgery) Curlene Agent, MD as Consulting Physician (Obstetrics and Gynecology) Dyane Rush, MD (Inactive) as Consulting Physician (Gastroenterology) Loretha Ash, MD as Consulting Physician (Hematology and Oncology) Glean Stephane BROCKS, RN as Oncology Nurse Navigator Tyree Nanetta SAILOR, RN as Oncology Nurse Navigator  CHIEF COMPLAINTS/PURPOSE OF CONSULTATION:  Newly diagnosed breast cancer  HISTORY OF PRESENTING ILLNESS:   Rebecca Steele 73 y.o. female is here because of recent diagnosis of left breast cancer  I reviewed her records extensively and collaborated the history with the patient.  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)  09/22/2023 Mammogram   Patient self palpated a breast mass in her left breast around November 2019 went for diagnostic mammogram, this showed there is a developing density on mammogram, 2 of the lower left axillary lymph nodes exhibit increased cortical thickness, ultrasound confirmed a 2.7 cm irregular hypoechoic mass in left breast at 12:00 5 cm from the nipple along with abnormal left axillary lymph   09/29/2023 Pathology Results   Left breast central superior needle core biopsy 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)   10/20/2023 - 10/23/2023 Chemotherapy   Patient is on Treatment Plan : BREAST  Docetaxel  + Carboplatin  + Trastuzumab  + Pertuzumab   (TCHP) q21d      11/24/2023 Cancer Staging   Staging form: Breast, AJCC 8th Edition -  Pathologic stage from 11/24/2023: ypT2, pN1, cM0, G3, ER-, PR-, HER2+ - Signed by Loretha Ash, MD on 11/24/2023 Stage prefix: Post-therapy Histologic grading system: 3 grade system   01/16/2024 -  Chemotherapy   Patient is on Treatment Plan : BREAST ADO-Trastuzumab Emtansine  (Kadcyla ) q21d       She had left breast lumpectomy which showed Invasive poorly differentiated adenocarcinoma, grade 3, focal high grade DCIS, solid type without necrosis. Tumor measures 3.6 cms in greatest dimension.Invasive tumor in lateral and posterior margins. Pos lateral margin, 2/4 LN with macromets, one with ITC. She is scheduled for re excision on 12/12/2023.  Discussed the use of AI scribe software for clinical note transcription with the patient, who gave verbal consent to proceed.  History of Present Illness    History of Present Illness  Rebecca Steele is a 73 year old female who presents with a non-healing wound currently on adj kadcyla .  She has a non-healing wound with a one centimeter separation and no erythema. The wound has not healed despite the use of silver nitrate and is located in an area affected by movement, which may contribute to the delayed healing. Radiation therapy has been postponed until the wound heals further.  She is undergoing treatment with Kadcyla  (trastuzumab  emtansine), with the last dose administered on January 16, 2024. The wound has not worsened since starting Kadcyla , but there has been no significant improvement either. She experiences fatigue and taste distortion as side effects, noting she can taste orange juice and chocolate but not other foods. No tingling or numbness in her hands or feet.  Physical therapy exercises seem to exacerbate the wound, causing it to open more,  leading to inconsistency in performing them. She experiences tightness in the area, which she attributes to not doing the exercises.  She maintains her weight and continues to participate in church  activities, including directing music, although she is cautious about using her left hand due to the wound. She experiences episodes of fatigue that come in waves and has not been taking a multivitamin.   Rest of the pertinent 10 point ROS reviewed and neg.  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 states she was given gabapentin  and fentanyl  with breast surgery and was diff to wake up   Depression    History of kidney stones    Hypothyroidism    Mitral valve prolapse    Sleep apnea    HAS MILD OSA, NO CPAP NEEDED    SURGICAL HISTORY: Past Surgical History:  Procedure Laterality Date   APPENDECTOMY     BREAST BIOPSY Right 09/16/2022   MM RT BREAST BX W LOC DEV 1ST LESION IMAGE BX SPEC STEREO GUIDE 09/16/2022 [facility]   BREAST BIOPSY  12/06/2022   MM RT RADIOACTIVE SEED LOC MAMMO GUIDE 12/06/2022 [facility]   BREAST BIOPSY Left 11/16/2023   US  LT RADIOACTIVE SEED LOC 11/16/2023 [facility]   BREAST BIOPSY  11/16/2023   MM LT RADIOACTIVE SEED EA ADD LESION LOC MAMMO GUIDE 11/16/2023 [facility]   BREAST EXCISIONAL BIOPSY Right 2018   BREAST LUMPECTOMY WITH RADIOACTIVE SEED AND SENTINEL LYMPH NODE BIOPSY Left 11/20/2023   Procedure: LEFT BREAST SEED GUIDED LUMPECTOMY, LEFT AXILLARY SENTINEL NODE BIOPSY;  Surgeon: Ebbie Cough, MD;  Location: [facility];  Service: General;  Laterality: Left;  PEC BLOCK   CHOLECYSTECTOMY     CYSTOSCOPY W/ RETROGRADES     KYPHOPLASTY N/A 10/05/2021   Procedure: LUMBAR ONE KYPHOPLASTY;  Surgeon: Cheryle Debby LABOR, MD;  Location: [facility];  Service: Neurosurgery;  Laterality: N/A;   NECK SURGERY     PORTACATH PLACEMENT Right 10/12/2023   Procedure: INSERTION PORT-A-CATH WITH GUIDED ULTRASOUND;  Surgeon:  Ebbie Cough, MD;  Location: [facility];  Service: General;  Laterality: Right;   RADIOACTIVE SEED GUIDED AXILLARY SENTINEL LYMPH NODE Left 11/20/2023   Procedure: LEFT AXILLARY NODE SEED GUIDED EXCISION;  Surgeon: Ebbie Cough, MD;  Location: [facility];  Service: General;  Laterality: Left;   RADIOACTIVE SEED GUIDED EXCISIONAL BREAST BIOPSY Right 12/12/2017   Procedure: RIGHT RADIOACTIVE SEED GUIDED EXCISIONAL BREAST BIOPSY ERAS PATHWAY;  Surgeon: Ebbie Cough, MD;  Location: [facility];  Service: General;  Laterality: Right;  LMA   RADIOACTIVE SEED GUIDED EXCISIONAL BREAST BIOPSY Right 12/07/2022   Procedure: RADIOACTIVE SEED GUIDED EXCISIONAL RIGHT BREAST BIOPSY;  Surgeon: Ebbie Cough, MD;  Location: [facility];  Service: General;  Laterality: Right;   SHOULDER SURGERY     SIMPLE MASTECTOMY WITH AXILLARY SENTINEL NODE BIOPSY Left 12/12/2023   Procedure: LEFT  MASTECTOMY;  Surgeon: Ebbie Cough, MD;  Location: [facility];  Service: General;  Laterality: Left;   TONSILLECTOMY     TOTAL HIP ARTHROPLASTY Left 06/04/2021   Procedure: TOTAL HIP ARTHROPLASTY ANTERIOR APPROACH;  Surgeon: Yvone Rush, MD;  Location: [facility];  Service: Orthopedics;  Laterality: Left;  TRIGGER FINGER RELEASE Right 05/18/2015   Procedure: RIGHT  LONG FINGER TRIGGER RELEASE ;  Surgeon: Alm Hummer, MD;  Location: [facility];  Service: Orthopedics;  Laterality: Right;    SOCIAL HISTORY: Social History   Socioeconomic History   Marital status: Married    Spouse name: Not on file   Number of children: Not on file   Years of education: Not on file   Highest education level: Not on file  Occupational History   Not on file  Tobacco Use   Smoking status: Never   Smokeless tobacco: Never  Vaping Use   Vaping status: Never Used  Substance and Sexual Activity   Alcohol  use: No   Drug use: No    Sexual activity: Not Currently    Birth control/protection: Post-menopausal  Other Topics Concern   Not on file  Social History Narrative   Not on file   Social Drivers of Health   Financial Resource Strain: Low Risk  (01/01/2024)   Received from [facility]   Overall Financial Resource Strain (CARDIA)    Difficulty of Paying Living Expenses: Not hard at all  Food Insecurity: No Food Insecurity (01/12/2024)   Hunger Vital Sign    Worried About Running Out of Food in the Last Year: Never true    Ran Out of Food in the Last Year: Never true  Transportation Needs: No Transportation Needs (01/01/2024)   Received from [facility] - Transportation    Lack of Transportation (Medical): No    Lack of Transportation (Non-Medical): No  Physical Activity: Inactive (04/24/2023)   Received from [facility]   Exercise Vital Sign    Days of Exercise per Week: 0 days    Minutes of Exercise per Session: 10 min  Stress: No Stress Concern Present (04/24/2023)   Received from [facility] - Occupational Stress Questionnaire    Feeling of Stress : Not at all  Social Connections: Socially Integrated (04/24/2023)   Received from [facility]   Social Network    How would you rate your social network (family, work, friends)?: Good participation with social networks  Intimate Partner Violence: Not At Risk (12/05/2023)   Humiliation, Afraid, Rape, and Kick questionnaire    Fear of Current or Ex-Partner: No    Emotionally Abused: No    Physically Abused: No    Sexually Abused: No    FAMILY HISTORY: Family History  Problem Relation Age of Onset   Breast cancer Maternal Aunt        55s    ALLERGIES:  is allergic to gadolinium derivatives, other, valium, paxlovid [nirmatrelvir-ritonavir], doxycycline , fentanyl , iohexol , macrodantin, red dye #40 (allura red), rocephin [ceftriaxone], and zithromax [azithromycin].  MEDICATIONS:  Current  Outpatient Medications  Medication Sig Dispense Refill   butalbital -acetaminophen -caffeine  (FIORICET) 50-325-40 MG tablet Take 1 tablet by mouth 2 (two) times daily as needed for headache or migraine.     levothyroxine  (SYNTHROID ) 25 MCG tablet Take 25 mcg by mouth daily before breakfast.     lidocaine -prilocaine  (EMLA ) cream Apply to affected area once 30 g 3   loratadine  (CLARITIN ) 10 MG tablet Take 10 mg by mouth daily.     methocarbamol  (ROBAXIN ) 500 MG tablet Take 1 tablet (500 mg total) by mouth every 8 (eight) hours as needed for muscle spasms. 30 tablet 0   montelukast  (SINGULAIR ) 10 MG tablet Take 10 mg by mouth at bedtime.     mupirocin  ointment (  BACTROBAN ) 2 % Apply 1 Application topically 2 (two) times daily. 22 g 0   ondansetron  (ZOFRAN ) 8 MG tablet Take 1 tablet (8 mg total) by mouth every 8 (eight) hours as needed for nausea or vomiting. 30 tablet 1   potassium chloride  (KLOR-CON ) 10 MEQ tablet Take 1 tablet (10 mEq total) by mouth daily for 10 days. 10 tablet 0   prochlorperazine  (COMPAZINE ) 10 MG tablet Take 1 tablet (10 mg total) by mouth every 6 (six) hours as needed for nausea or vomiting. 30 tablet 1   sertraline  (ZOLOFT ) 50 MG tablet Take 50 mg by mouth daily.     traMADol  (ULTRAM ) 50 MG tablet Take 1 tablet (50 mg total) by mouth every 6 (six) hours as needed (mild pain). 10 tablet 0   traMADol  (ULTRAM ) 50 MG tablet Take 1 tablet (50 mg total) by mouth every 6 (six) hours as needed (mild pain). 10 tablet 0   traZODone  (DESYREL ) 100 MG tablet Take 100 mg by mouth at bedtime.     No current facility-administered medications for this visit.    REVIEW OF SYSTEMS:   Constitutional: Denies fevers, chills or abnormal night sweats Eyes: Denies blurriness of vision, double vision or watery eyes Ears, nose, mouth, throat, and face: Denies mucositis or sore throat Respiratory: Denies cough, dyspnea or wheezes Cardiovascular: Denies palpitation, chest discomfort or lower extremity  swelling Gastrointestinal:  Denies nausea, heartburn or change in bowel habits Skin: Denies abnormal skin rashes Lymphatics: Denies new lymphadenopathy or easy bruising Neurological:Denies numbness, tingling or new weaknesses Behavioral/Psych: Mood is stable, no new changes  Breast: As mentioned above All other systems were reviewed with the patient and are negative.  PHYSICAL EXAMINATION: ECOG PERFORMANCE STATUS: 0 - Asymptomatic  Vitals:   02/06/24 1056  BP: (!) 147/71  Pulse: 86  Resp: 16  Temp: (!) 97.2 F (36.2 C)  SpO2: 99%     Filed Weights   02/06/24 1056  Weight: 192 lb 3.2 oz (87.2 kg)    GENERAL appears well. No acute distress. Left mastectomy site with minor wound dehiscence. No concern for infection CTA bilaterally RRR No LE edema.  LABORATORY DATA:  I have reviewed the data as listed Lab Results  Component Value Date   WBC 5.1 02/06/2024   HGB 14.2 02/06/2024   HCT 41.9 02/06/2024   MCV 91.5 02/06/2024   PLT 258 02/06/2024   Lab Results  Component Value Date   NA 142 02/06/2024   K 3.7 02/06/2024   CL 109 02/06/2024   CO2 30 02/06/2024    RADIOGRAPHIC STUDIES: I have personally reviewed the radiological reports and agreed with the findings in the report.  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  Assessment and Plan Assessment & Plan Open wound with delayed healing Wound separation in a high-movement area delaying healing. No infection. Silver nitrate ineffective. Radiation therapy postponed.  Kadcyla  prioritized due to systemic recurrence risk. - Hold physical therapy exercises. - Avoid overuse of the left hand. - Communicate with Dr. Ebbie and Dr. Dewey regarding wound  status.  Distortion of taste due to Kadcyla  Persistent taste distortion expected to fluctuate with treatment cycles. No other significant side effects except fatigue.  Fatigue due to Kadcyla  Intermittent  fatigue with prolonged sleep episodes. Blood counts stable, no infection or other causes identified. - Monitor fatigue levels and report changes. - Consider multivitamin for nutritional support.   Total time spent: 30 min including history, exam, discussion of options, review of records, counseling and coordination of care  All questions were answered. The patient knows to call the clinic with any problems, questions or concerns.    Amber Stalls, MD 02/07/24

## 2024-02-07 ENCOUNTER — Ambulatory Visit

## 2024-02-07 ENCOUNTER — Encounter: Payer: Self-pay | Admitting: Hematology and Oncology

## 2024-02-07 NOTE — Assessment & Plan Note (Signed)
 This is a very pleasant 73 year old postmenopausal female patient with  Left breast IDC, ER PR neg, her 2 positive attempted neoadj TCHP, only could tolerate one cycle, then went on to surgery now on adj kadcyla .  Assessment and Plan Assessment & Plan Open wound with delayed healing Wound separation in a high-movement area delaying healing. No infection. Silver nitrate ineffective. Radiation therapy postponed.  Kadcyla  prioritized due to systemic recurrence risk. - Hold physical therapy exercises. - Avoid overuse of the left hand. - Communicate with Dr. Delane Fear and Dr. Jeryl Moris regarding wound status.  Distortion of taste due to Kadcyla  Persistent taste distortion expected to fluctuate with treatment cycles. No other significant side effects except fatigue.  Fatigue due to Kadcyla  Intermittent fatigue with prolonged sleep episodes. Blood counts stable, no infection or other causes identified. - Monitor fatigue levels and report changes. - Consider multivitamin for nutritional support.

## 2024-02-08 ENCOUNTER — Ambulatory Visit

## 2024-02-09 ENCOUNTER — Ambulatory Visit

## 2024-02-10 ENCOUNTER — Ambulatory Visit

## 2024-02-12 ENCOUNTER — Ambulatory Visit: Admitting: Radiation Oncology

## 2024-02-13 ENCOUNTER — Other Ambulatory Visit: Payer: Self-pay

## 2024-02-13 ENCOUNTER — Ambulatory Visit

## 2024-02-14 ENCOUNTER — Ambulatory Visit

## 2024-02-15 ENCOUNTER — Encounter: Payer: Self-pay | Admitting: Dietician

## 2024-02-15 ENCOUNTER — Ambulatory Visit

## 2024-02-15 NOTE — Progress Notes (Signed)
 Patient participated in the Nutrition and Cancer webinar. AICR guidelines were reviewed.  [facility] nutrition resources for survivors were encouraged (FYNN, cooking classes, individual MNT with outpatient RDs in weight management and DM management).  Contact information provided.  Carleen Chary, RDN, LDN Registered Dietitian, [facility] Part Time Remote (Usual office hours: Tuesday-Thursday) Mobile: [phone] Remote Office: [phone]

## 2024-02-16 ENCOUNTER — Ambulatory Visit

## 2024-02-17 ENCOUNTER — Ambulatory Visit

## 2024-02-18 ENCOUNTER — Ambulatory Visit

## 2024-02-19 ENCOUNTER — Ambulatory Visit
Admission: RE | Admit: 2024-02-19 | Discharge: 2024-02-19 | Disposition: A | Discharge status: Home / Self Care | Source: Ambulatory Visit | Attending: Radiation Oncology | Admitting: Radiation Oncology

## 2024-02-19 ENCOUNTER — Other Ambulatory Visit: Payer: Self-pay

## 2024-02-19 DIAGNOSIS — Z171 Estrogen receptor negative status [ER-]: Secondary | ICD-10-CM | POA: Insufficient documentation

## 2024-02-19 DIAGNOSIS — C50212 Malignant neoplasm of upper-inner quadrant of left female breast: Secondary | ICD-10-CM | POA: Insufficient documentation

## 2024-02-19 DIAGNOSIS — Z5112 Encounter for antineoplastic immunotherapy: Secondary | ICD-10-CM | POA: Diagnosis not present

## 2024-02-19 LAB — RAD ONC ARIA SESSION SUMMARY

## 2024-02-20 ENCOUNTER — Other Ambulatory Visit: Payer: Self-pay

## 2024-02-20 ENCOUNTER — Ambulatory Visit
Admission: RE | Admit: 2024-02-20 | Discharge: 2024-02-20 | Disposition: A | Discharge status: Home / Self Care | Source: Ambulatory Visit | Attending: Radiation Oncology | Admitting: Radiation Oncology

## 2024-02-20 DIAGNOSIS — Z5112 Encounter for antineoplastic immunotherapy: Secondary | ICD-10-CM | POA: Diagnosis not present

## 2024-02-20 LAB — RAD ONC ARIA SESSION SUMMARY

## 2024-02-21 ENCOUNTER — Other Ambulatory Visit: Payer: Self-pay

## 2024-02-21 ENCOUNTER — Ambulatory Visit
Admission: RE | Admit: 2024-02-21 | Discharge: 2024-02-21 | Disposition: A | Discharge status: Home / Self Care | Source: Ambulatory Visit | Attending: Radiation Oncology | Admitting: Radiation Oncology

## 2024-02-21 DIAGNOSIS — Z5112 Encounter for antineoplastic immunotherapy: Secondary | ICD-10-CM | POA: Diagnosis not present

## 2024-02-21 LAB — RAD ONC ARIA SESSION SUMMARY
Course Elapsed Days: 2
Plan Fractions Treated to Date: 2
Plan Fractions Treated to Date: 3
Plan Prescribed Dose Per Fraction: 1.8 Gy
Plan Prescribed Dose Per Fraction: 1.8 Gy
Plan Total Fractions Prescribed: 14
Plan Total Fractions Prescribed: 28
Plan Total Prescribed Dose: 25.2 Gy
Plan Total Prescribed Dose: 50.4 Gy
Reference Point Dosage Given to Date: 3.6 Gy
Reference Point Dosage Given to Date: 5.4 Gy
Reference Point Session Dosage Given: 1.8 Gy
Reference Point Session Dosage Given: 1.8 Gy
Session Number: 3

## 2024-02-22 ENCOUNTER — Other Ambulatory Visit: Payer: Self-pay

## 2024-02-22 ENCOUNTER — Ambulatory Visit
Admission: RE | Admit: 2024-02-22 | Discharge: 2024-02-22 | Disposition: A | Discharge status: Home / Self Care | Source: Ambulatory Visit | Attending: Radiation Oncology | Admitting: Radiation Oncology

## 2024-02-22 DIAGNOSIS — Z5112 Encounter for antineoplastic immunotherapy: Secondary | ICD-10-CM | POA: Diagnosis not present

## 2024-02-22 LAB — RAD ONC ARIA SESSION SUMMARY
Course Elapsed Days: 3
Plan Fractions Treated to Date: 2
Plan Fractions Treated to Date: 4
Plan Prescribed Dose Per Fraction: 1.8 Gy
Plan Prescribed Dose Per Fraction: 1.8 Gy
Plan Total Fractions Prescribed: 14
Plan Total Fractions Prescribed: 28
Plan Total Prescribed Dose: 25.2 Gy
Plan Total Prescribed Dose: 50.4 Gy
Reference Point Dosage Given to Date: 3.6 Gy
Reference Point Dosage Given to Date: 7.2 Gy
Reference Point Session Dosage Given: 1.8 Gy
Reference Point Session Dosage Given: 1.8 Gy
Session Number: 4

## 2024-02-23 ENCOUNTER — Other Ambulatory Visit: Payer: Self-pay

## 2024-02-23 ENCOUNTER — Ambulatory Visit: Admitting: Radiation Oncology

## 2024-02-23 ENCOUNTER — Ambulatory Visit
Admission: RE | Admit: 2024-02-23 | Discharge: 2024-02-23 | Disposition: A | Discharge status: Home / Self Care | Source: Ambulatory Visit | Attending: Radiation Oncology | Admitting: Radiation Oncology

## 2024-02-23 ENCOUNTER — Telehealth: Payer: Self-pay

## 2024-02-23 DIAGNOSIS — C50212 Malignant neoplasm of upper-inner quadrant of left female breast: Secondary | ICD-10-CM

## 2024-02-23 DIAGNOSIS — Z5112 Encounter for antineoplastic immunotherapy: Secondary | ICD-10-CM | POA: Diagnosis not present

## 2024-02-23 LAB — RAD ONC ARIA SESSION SUMMARY
Course Elapsed Days: 4
Plan Fractions Treated to Date: 3
Plan Fractions Treated to Date: 5
Plan Prescribed Dose Per Fraction: 1.8 Gy
Plan Prescribed Dose Per Fraction: 1.8 Gy
Plan Total Fractions Prescribed: 14
Plan Total Fractions Prescribed: 28
Plan Total Prescribed Dose: 25.2 Gy
Plan Total Prescribed Dose: 50.4 Gy
Reference Point Dosage Given to Date: 5.4 Gy
Reference Point Dosage Given to Date: 9 Gy
Reference Point Session Dosage Given: 1.8 Gy
Reference Point Session Dosage Given: 1.8 Gy
Session Number: 5

## 2024-02-23 MED ORDER — ALRA NON-METALLIC DEODORANT (RAD-ONC)
1.0000 | Freq: Once | TOPICAL | Status: AC
  Administered 2024-02-23: 1 via TOPICAL

## 2024-02-23 MED ORDER — RADIAPLEXRX EX GEL: Freq: Once | CUTANEOUS | Status: AC

## 2024-02-23 NOTE — Telephone Encounter (Signed)
Pt verbally confirmed appt

## 2024-02-24 ENCOUNTER — Ambulatory Visit

## 2024-02-25 ENCOUNTER — Ambulatory Visit

## 2024-02-27 ENCOUNTER — Inpatient Hospital Stay: Payer: HMO | Admitting: Hematology and Oncology

## 2024-02-27 ENCOUNTER — Inpatient Hospital Stay: Payer: HMO

## 2024-02-27 ENCOUNTER — Ambulatory Visit
Admission: RE | Admit: 2024-02-27 | Discharge: 2024-02-27 | Disposition: A | Discharge status: Home / Self Care | Source: Ambulatory Visit | Attending: Radiation Oncology

## 2024-02-27 ENCOUNTER — Other Ambulatory Visit: Payer: Self-pay

## 2024-02-27 VITALS — BP 138/80 | HR 80 | Temp 97.5°F | Resp 18 | Wt 190.0 lb

## 2024-02-27 DIAGNOSIS — Z95828 Presence of other vascular implants and grafts: Secondary | ICD-10-CM

## 2024-02-27 DIAGNOSIS — C50212 Malignant neoplasm of upper-inner quadrant of left female breast: Secondary | ICD-10-CM | POA: Diagnosis not present

## 2024-02-27 DIAGNOSIS — Z5112 Encounter for antineoplastic immunotherapy: Secondary | ICD-10-CM | POA: Diagnosis not present

## 2024-02-27 DIAGNOSIS — Z171 Estrogen receptor negative status [ER-]: Secondary | ICD-10-CM | POA: Diagnosis not present

## 2024-02-27 LAB — CBC WITH DIFFERENTIAL (CANCER CENTER ONLY)
Abs Immature Granulocytes: 0.01 10*3/uL (ref 0.00–0.07)
Basophils Absolute: 0.1 10*3/uL (ref 0.0–0.1)
Basophils Relative: 3 %
Eosinophils Absolute: 0.2 10*3/uL (ref 0.0–0.5)
Eosinophils Relative: 5 %
HCT: 42.5 % (ref 36.0–46.0)
Hemoglobin: 14.5 g/dL (ref 12.0–15.0)
Immature Granulocytes: 0 %
Lymphocytes Relative: 30 %
Lymphs Abs: 1.1 10*3/uL (ref 0.7–4.0)
MCH: 30.9 pg (ref 26.0–34.0)
MCHC: 34.1 g/dL (ref 30.0–36.0)
MCV: 90.4 fL (ref 80.0–100.0)
Monocytes Absolute: 0.5 10*3/uL (ref 0.1–1.0)
Monocytes Relative: 14 %
Neutro Abs: 1.8 10*3/uL (ref 1.7–7.7)
Neutrophils Relative %: 48 %
Platelet Count: 213 10*3/uL (ref 150–400)
RBC: 4.7 MIL/uL (ref 3.87–5.11)
RDW: 13.2 % (ref 11.5–15.5)
WBC Count: 3.6 10*3/uL — ABNORMAL LOW (ref 4.0–10.5)
nRBC: 0 % (ref 0.0–0.2)

## 2024-02-27 LAB — CMP (CANCER CENTER ONLY)
ALT: 24 U/L (ref 0–44)
AST: 26 U/L (ref 15–41)
Albumin: 4.1 g/dL (ref 3.5–5.0)
Alkaline Phosphatase: 85 U/L (ref 38–126)
Anion gap: 7 (ref 5–15)
BUN: 10 mg/dL (ref 8–23)
CO2: 27 mmol/L (ref 22–32)
Calcium: 9.6 mg/dL (ref 8.9–10.3)
Chloride: 107 mmol/L (ref 98–111)
Creatinine: 0.84 mg/dL (ref 0.44–1.00)
GFR, Estimated: >60 mL/min (ref >60)
Glucose, Bld: 130 mg/dL — ABNORMAL HIGH (ref 70–99)
Potassium: 3.4 mmol/L — ABNORMAL LOW (ref 3.5–5.1)
Sodium: 141 mmol/L (ref 135–145)
Total Bilirubin: 0.6 mg/dL (ref 0.0–1.2)
Total Protein: 6.5 g/dL (ref 6.5–8.1)

## 2024-02-27 LAB — RAD ONC ARIA SESSION SUMMARY
Course Elapsed Days: 8
Plan Fractions Treated to Date: 3
Plan Fractions Treated to Date: 6
Plan Prescribed Dose Per Fraction: 1.8 Gy
Plan Prescribed Dose Per Fraction: 1.8 Gy
Plan Total Fractions Prescribed: 14
Plan Total Fractions Prescribed: 28
Plan Total Prescribed Dose: 25.2 Gy
Plan Total Prescribed Dose: 50.4 Gy
Reference Point Dosage Given to Date: 10.8 Gy
Reference Point Dosage Given to Date: 5.4 Gy
Reference Point Session Dosage Given: 1.8 Gy
Reference Point Session Dosage Given: 1.8 Gy
Session Number: 6

## 2024-02-27 MED ORDER — SODIUM CHLORIDE 0.9% FLUSH
10.0000 mL | Freq: Once | INTRAVENOUS | Status: AC
  Administered 2024-02-27: 10 mL

## 2024-02-27 MED ORDER — ACETAMINOPHEN 325 MG PO TABS
650.0000 mg | ORAL_TABLET | Freq: Once | ORAL | Status: AC
  Administered 2024-02-27: 650 mg via ORAL
  Filled 2024-02-27: qty 2

## 2024-02-27 MED ORDER — SODIUM CHLORIDE 0.9 % IV SOLN: INTRAVENOUS | Status: DC

## 2024-02-27 MED ORDER — CETIRIZINE HCL 10 MG PO TABS
10.0000 mg | ORAL_TABLET | Freq: Once | ORAL | Status: AC
  Administered 2024-02-27: 10 mg via ORAL
  Filled 2024-02-27: qty 1

## 2024-02-27 MED ORDER — SODIUM CHLORIDE 0.9 % IV SOLN
3.6000 mg/kg | Freq: Once | INTRAVENOUS | Status: AC
  Administered 2024-02-27: 320 mg via INTRAVENOUS
  Filled 2024-02-27: qty 16

## 2024-02-27 NOTE — Patient Instructions (Signed)
 [facility] - A DEPT OF [facility]  Discharge Instructions: Thank you for choosing [facility] to provide your oncology and hematology care.   If you have a lab appointment with the Cancer Center, please go directly to the Cancer Center and check in at the registration area.   Wear comfortable clothing and clothing appropriate for easy access to any Portacath or PICC line.   We strive to give you quality time with your provider. You may need to reschedule your appointment if you arrive late (15 or more minutes).  Arriving late affects you and other patients whose appointments are after yours.  Also, if you miss three or more appointments without notifying the office, you may be dismissed from the clinic at the provider's discretion.      For prescription refill requests, have your pharmacy contact our office and allow 72 hours for refills to be completed.    Today you received the following chemotherapy and/or immunotherapy agents: Kadcyla      To help prevent nausea and vomiting after your treatment, we encourage you to take your nausea medication as directed.  BELOW ARE SYMPTOMS THAT SHOULD BE REPORTED IMMEDIATELY: *FEVER GREATER THAN 100.4 F (38 C) OR HIGHER *CHILLS OR SWEATING *NAUSEA AND VOMITING THAT IS NOT CONTROLLED WITH YOUR NAUSEA MEDICATION *UNUSUAL SHORTNESS OF BREATH *UNUSUAL BRUISING OR BLEEDING *URINARY PROBLEMS (pain or burning when urinating, or frequent urination) *BOWEL PROBLEMS (unusual diarrhea, constipation, pain near the anus) TENDERNESS IN MOUTH AND THROAT WITH OR WITHOUT PRESENCE OF ULCERS (sore throat, sores in mouth, or a toothache) UNUSUAL RASH, SWELLING OR PAIN  UNUSUAL VAGINAL DISCHARGE OR ITCHING   Items with * indicate a potential emergency and should be followed up as soon as possible or go to the Emergency Department if any problems should occur.  Please show the CHEMOTHERAPY ALERT CARD or IMMUNOTHERAPY  ALERT CARD at check-in to the Emergency Department and triage nurse.  Should you have questions after your visit or need to cancel or reschedule your appointment, please contact [facility] - A DEPT OF [facility]  Dept: [phone]  and follow the prompts.  Office hours are 8:00 a.m. to 4:30 p.m. Monday - Friday. Please note that voicemails left after 4:00 p.m. may not be returned until the following business day.  We are closed weekends and major holidays. You have access to a nurse at all times for urgent questions. Please call the main number to the clinic Dept: [phone] and follow the prompts.   For any non-urgent questions, you may also contact your provider using MyChart. We now offer e-Visits for anyone 29 and older to request care online for non-urgent symptoms. For details visit [email or web address].   Also download the MyChart app! Go to the app store, search "MyChart", open the app, select , and log in with your MyChart username and password.

## 2024-02-27 NOTE — Assessment & Plan Note (Signed)
 This is a very pleasant 73 year old postmenopausal female patient with  Left breast IDC, ER PR neg, her 2 positive attempted neoadj TCHP, only could tolerate one cycle, then went on to surgery now on adj kadcyla .  Assessment and Plan Assessment & Plan On Adjuvant kadcyla  Experiencing headaches and tearing of the right eye post-treatment. No extremity symptoms. Distorted taste noted. - Report recurrence of eye symptoms. - ok to continue adj kadcyla  as planned.  Distorted taste Likely a side effect of ongoing radiation therapy.  Fatigue Possibly related to recent cessation of Synthroid  and ongoing radiation therapy. Sleep disturbances noted. - Resume Synthroid  to prevent exacerbation of fatigue. - Advise on sleep hygiene to improve rest.  Back pain with sciatica Back pain with leg radiation suggestive of sciatica. Managed with Tylenol  and heating pad. No liver function abnormalities. - Recommend extra strength Tylenol  500-650 mg up to three times a day for one week. - Advise rest to aid recovery.  Hypothyroidism Stopped Synthroid  due to morning routine issues. Importance of continuation emphasized. Thyroid  function not checked today. - Resume Synthroid  until next visit. - Check thyroid  function at next visit.

## 2024-02-27 NOTE — Progress Notes (Signed)
 [facility] CONSULT NOTE  Patient Care Team: Louis Row, MD as PCP - General (Internal Medicine) Enid Harry, MD as Consulting Physician (General Surgery) Thora Flint, MD as Consulting Physician (Obstetrics and Gynecology) Delilah Fend, MD (Inactive) as Consulting Physician (Gastroenterology) Murleen Arms, MD as Consulting Physician (Hematology and Oncology) Auther Bo, RN as Oncology Nurse Navigator Alane Hsu, RN as Oncology Nurse Navigator  CHIEF COMPLAINTS/PURPOSE OF CONSULTATION:  Breast cancer follow up.  HISTORY OF PRESENTING ILLNESS:   Rebecca Steele 73 y.o. female is here because of recent diagnosis of left breast cancer  I reviewed her records extensively and collaborated the history with the patient.  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)  09/22/2023 Mammogram   Patient self palpated a breast mass in her left breast around November 2019 went for diagnostic mammogram, this showed there is a developing density on mammogram, 2 of the lower left axillary lymph nodes exhibit increased cortical thickness, ultrasound confirmed a 2.7 cm irregular hypoechoic mass in left breast at 12:00 5 cm from the nipple along with abnormal left axillary lymph   09/29/2023 Pathology Results   Left breast central superior needle core biopsy 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)   10/20/2023 - 10/23/2023 Chemotherapy   Patient is on Treatment Plan : BREAST  Docetaxel  + Carboplatin  + Trastuzumab  + Pertuzumab   (TCHP) q21d      11/24/2023 Cancer Staging   Staging form: Breast, AJCC 8th Edition -  Pathologic stage from 11/24/2023: ypT2, pN1, cM0, G3, ER-, PR-, HER2+ - Signed by Murleen Arms, MD on 11/24/2023 Stage prefix: Post-therapy Histologic grading system: 3 grade system   01/16/2024 -  Chemotherapy   Patient is on Treatment Plan : BREAST ADO-Trastuzumab Emtansine  (Kadcyla ) q21d       She had left breast lumpectomy which showed Invasive poorly differentiated adenocarcinoma, grade 3, focal high grade DCIS, solid type without necrosis. Tumor measures 3.6 cms in greatest dimension.Invasive tumor in lateral and posterior margins. Pos lateral margin, 2/4 LN with macromets, one with ITC. She had re-excision on 12/12/2023.   History of Present Illness    History of Present Illness  Rebecca Steele is a 73 year old female who is here for follow up on adj kadcyla .   Rebecca Steele is a 73 year old female who presents for follow-up regarding her ongoing treatment and medication management.  Her wound is improving since the last visit. She experiences fatigue and notes inadequate sleep the previous night. She has back pain radiating down her leg, suspecting sciatic nerve involvement. Tylenol  and a heating pad provide some relief.  She has discontinued several medications, including Synthroid , potassium, mupirocin , Zofran , and Compazine . Currently, she takes Zoloft  and trazodone  for sleep. She mistakenly mentioned taking tramadol  for sleep but clarified she is not using it. She stopped Synthroid  due to difficulty taking it with water  in the morning, despite long-term use prescribed by her previous primary care physician.  She experienced a headache after kadcyla  treatment, lasting four days, with pain behind her right eye and tearing. No tingling or numbness in her hands or feet. Her appetite is okay, though she  reports a distorted taste. Bowel movements are not great but manageable.  Her husband recently fell, resulting in significant ankle swelling and an emergency room visit where a  heart conduction issue was discovered. His blood pressure was high, and he is on several antihypertensive medications. She is concerned about his condition and the swelling in his ankles.  She feels tightness in the area affected by radiation and has resumed some physical therapy exercises to alleviate this. No mouth sores, and her port is functioning well.  Rest of the pertinent 10 point ROS reviewed and neg.  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 states she was given gabapentin  and fentanyl  with breast surgery and was diff to wake up   Depression    History of kidney stones    Hypothyroidism    Mitral valve prolapse    Sleep apnea    HAS MILD OSA, NO CPAP NEEDED    SURGICAL HISTORY: Past Surgical History:  Procedure Laterality Date   APPENDECTOMY     BREAST BIOPSY Right 09/16/2022   MM RT BREAST BX W LOC DEV 1ST LESION IMAGE BX SPEC STEREO GUIDE 09/16/2022 [facility]   BREAST BIOPSY  12/06/2022   MM RT RADIOACTIVE SEED LOC MAMMO GUIDE 12/06/2022 [facility]   BREAST BIOPSY Left 11/16/2023   US  LT RADIOACTIVE SEED LOC 11/16/2023 [facility]   BREAST BIOPSY  11/16/2023   MM LT RADIOACTIVE SEED EA ADD LESION LOC MAMMO GUIDE 11/16/2023 [facility]   BREAST EXCISIONAL BIOPSY Right 2018   BREAST LUMPECTOMY WITH RADIOACTIVE SEED AND SENTINEL LYMPH NODE BIOPSY Left 11/20/2023   Procedure: LEFT BREAST SEED GUIDED LUMPECTOMY, LEFT AXILLARY SENTINEL NODE BIOPSY;  Surgeon: Enid Harry, MD;  Location: [facility];  Service: General;  Laterality: Left;  PEC BLOCK   CHOLECYSTECTOMY     CYSTOSCOPY W/ RETROGRADES     KYPHOPLASTY N/A 10/05/2021   Procedure: LUMBAR ONE KYPHOPLASTY;  Surgeon: Cannon Champion, MD;  Location: [facility];  Service:  Neurosurgery;  Laterality: N/A;   NECK SURGERY     PORTACATH PLACEMENT Right 10/12/2023   Procedure: INSERTION PORT-A-CATH WITH GUIDED ULTRASOUND;  Surgeon: Enid Harry, MD;  Location: [facility];  Service: General;  Laterality: Right;   RADIOACTIVE SEED GUIDED AXILLARY SENTINEL LYMPH NODE Left 11/20/2023   Procedure: LEFT AXILLARY NODE SEED GUIDED EXCISION;  Surgeon: Enid Harry, MD;  Location: [facility];  Service: General;  Laterality: Left;   RADIOACTIVE SEED GUIDED EXCISIONAL BREAST BIOPSY Right 12/12/2017   Procedure: RIGHT RADIOACTIVE SEED GUIDED EXCISIONAL BREAST BIOPSY ERAS PATHWAY;  Surgeon: Enid Harry, MD;  Location: [facility];  Service: General;  Laterality: Right;  LMA   RADIOACTIVE SEED GUIDED EXCISIONAL BREAST BIOPSY Right 12/07/2022   Procedure: RADIOACTIVE SEED GUIDED EXCISIONAL RIGHT BREAST BIOPSY;  Surgeon: Enid Harry, MD;  Location: [facility];  Service: General;  Laterality: Right;   SHOULDER SURGERY     SIMPLE MASTECTOMY WITH AXILLARY SENTINEL NODE BIOPSY Left 12/12/2023   Procedure: LEFT  MASTECTOMY;  Surgeon: Enid Harry, MD;  Location: [facility];  Service: General;  Laterality: Left;   TONSILLECTOMY     TOTAL HIP ARTHROPLASTY Left 06/04/2021   Procedure: TOTAL  HIP ARTHROPLASTY ANTERIOR APPROACH;  Surgeon: Neil Balls, MD;  Location: [facility];  Service: Orthopedics;  Laterality: Left;   TRIGGER FINGER RELEASE Right 05/18/2015   Procedure: RIGHT  LONG FINGER TRIGGER RELEASE ;  Surgeon: Rober Chimera, MD;  Location: [facility];  Service: Orthopedics;  Laterality: Right;    SOCIAL HISTORY: Social History   Socioeconomic History   Marital status: Married    Spouse name: Not on file   Number of children: Not on file   Years of education: Not on file   Highest education level: Not on file  Occupational History   Not on file  Tobacco Use   Smoking  status: Never   Smokeless tobacco: Never  Vaping Use   Vaping status: Never Used  Substance and Sexual Activity   Alcohol  use: No   Drug use: No   Sexual activity: Not Currently    Birth control/protection: Post-menopausal  Other Topics Concern   Not on file  Social History Narrative   Not on file   Social Drivers of Health   Financial Resource Strain: Low Risk  (01/01/2024)   Received from [facility]   Overall Financial Resource Strain (CARDIA)    Difficulty of Paying Living Expenses: Not hard at all  Food Insecurity: No Food Insecurity (01/12/2024)   Hunger Vital Sign    Worried About Running Out of Food in the Last Year: Never true    Ran Out of Food in the Last Year: Never true  Transportation Needs: No Transportation Needs (01/01/2024)   Received [facility] - Transportation    Lack of Transportation (Medical): No    Lack of Transportation (Non-Medical): No  Physical Activity: Inactive (04/24/2023)   Received from [facility]   Exercise Vital Sign    Days of Exercise per Week: 0 days    Minutes of Exercise per Session: 10 min  Stress: No Stress Concern Present (04/24/2023)   Received from [facility] - Occupational Stress Questionnaire    Feeling of Stress : Not at all  Social Connections: Socially Integrated (04/24/2023)   Received from [facility]   Social Network    How would you rate your social network (family, work, friends)?: Good participation with social networks  Intimate Partner Violence: Not At Risk (12/05/2023)   Humiliation, Afraid, Rape, and Kick questionnaire    Fear of Current or Ex-Partner: No    Emotionally Abused: No    Physically Abused: No    Sexually Abused: No    FAMILY HISTORY: Family History  Problem Relation Age of Onset   Breast cancer Maternal Aunt        80s    ALLERGIES:  is allergic to gadolinium derivatives, other, valium, paxlovid [nirmatrelvir-ritonavir],  doxycycline , fentanyl , iohexol , macrodantin, red dye #40 (allura red), rocephin [ceftriaxone], and zithromax [azithromycin].  MEDICATIONS:  Current Outpatient Medications  Medication Sig Dispense Refill   butalbital -acetaminophen -caffeine  (FIORICET) 50-325-40 MG tablet Take 1 tablet by mouth 2 (two) times daily as needed for headache or migraine.     levothyroxine  (SYNTHROID ) 25 MCG tablet Take 25 mcg by mouth daily before breakfast.     lidocaine -prilocaine  (EMLA ) cream Apply to affected area once 30 g 3   loratadine  (CLARITIN ) 10 MG tablet Take 10 mg by mouth daily.     methocarbamol  (ROBAXIN ) 500 MG tablet Take 1 tablet (500 mg total) by mouth every 8 (eight) hours as needed for muscle spasms. 30 tablet  0   montelukast  (SINGULAIR ) 10 MG tablet Take 10 mg by mouth at bedtime.     prochlorperazine  (COMPAZINE ) 10 MG tablet Take 1 tablet (10 mg total) by mouth every 6 (six) hours as needed for nausea or vomiting. 30 tablet 1   sertraline  (ZOLOFT ) 50 MG tablet Take 50 mg by mouth daily.     traZODone  (DESYREL ) 100 MG tablet Take 100 mg by mouth at bedtime.     No current facility-administered medications for this visit.    REVIEW OF SYSTEMS:   Constitutional: Denies fevers, chills or abnormal night sweats Eyes: Denies blurriness of vision, double vision or watery eyes Ears, nose, mouth, throat, and face: Denies mucositis or sore throat Respiratory: Denies cough, dyspnea or wheezes Cardiovascular: Denies palpitation, chest discomfort or lower extremity swelling Gastrointestinal:  Denies nausea, heartburn or change in bowel habits Skin: Denies abnormal skin rashes Lymphatics: Denies new lymphadenopathy or easy bruising Neurological:Denies numbness, tingling or new weaknesses Behavioral/Psych: Mood is stable, no new changes  Breast: As mentioned above All other systems were reviewed with the patient and are negative.  PHYSICAL EXAMINATION: ECOG PERFORMANCE STATUS: 0 -  Asymptomatic  Vitals:   02/27/24 1038  BP: 138/80  Pulse: 80  Resp: 18  Temp: (!) 97.5 F (36.4 C)  SpO2: 99%     Filed Weights   02/27/24 1038  Weight: 190 lb (86.2 kg)    GENERAL appears well. No acute distress. Left mastectomy site with near healed wound. No concern for infection CTA bilaterally RRR No LE edema.  LABORATORY DATA:  I have reviewed the data as listed Lab Results  Component Value Date   WBC 3.6 (L) 02/27/2024   HGB 14.5 02/27/2024   HCT 42.5 02/27/2024   MCV 90.4 02/27/2024   PLT 213 02/27/2024   Lab Results  Component Value Date   NA 141 02/27/2024   K 3.4 (L) 02/27/2024   CL 107 02/27/2024   CO2 27 02/27/2024    RADIOGRAPHIC STUDIES: I have personally reviewed the radiological reports and agreed with the findings in the report.  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  Assessment and Plan Assessment & Plan On Adjuvant kadcyla  Experiencing headaches and tearing of the right eye post-treatment. No extremity symptoms. Distorted taste noted. - Report recurrence of eye symptoms. - ok to continue adj kadcyla  as planned.  Distorted taste Likely a side effect of ongoing radiation therapy.  Fatigue Possibly related to recent cessation of Synthroid  and ongoing radiation therapy. Sleep disturbances noted. - Resume Synthroid  to prevent exacerbation of fatigue. - Advise on sleep hygiene to improve rest.  Back pain with sciatica Back pain with leg radiation suggestive of sciatica. Managed with Tylenol  and heating pad. No liver function abnormalities. - Recommend extra strength Tylenol  500-650 mg up to three times a day for one week. - Advise rest to aid  recovery.  Hypothyroidism Stopped Synthroid  due to morning routine issues. Importance of continuation emphasized. Thyroid  function not checked today. - Resume Synthroid  until next visit. - Check thyroid  function at next visit.     Total time spent: 30 min including history, exam, discussion of options, review of records, counseling and coordination of care  All questions were answered. The patient knows  to call the clinic with any problems, questions or concerns.    Murleen Arms, MD 02/27/24

## 2024-02-28 ENCOUNTER — Other Ambulatory Visit: Payer: Self-pay

## 2024-02-28 ENCOUNTER — Ambulatory Visit
Admission: RE | Admit: 2024-02-28 | Discharge: 2024-02-28 | Disposition: A | Discharge status: Home / Self Care | Source: Ambulatory Visit | Attending: Radiation Oncology | Admitting: Radiation Oncology

## 2024-02-28 DIAGNOSIS — Z5112 Encounter for antineoplastic immunotherapy: Secondary | ICD-10-CM | POA: Diagnosis not present

## 2024-02-28 LAB — RAD ONC ARIA SESSION SUMMARY
Course Elapsed Days: 9
Plan Fractions Treated to Date: 4
Plan Fractions Treated to Date: 7
Plan Prescribed Dose Per Fraction: 1.8 Gy
Plan Prescribed Dose Per Fraction: 1.8 Gy
Plan Total Fractions Prescribed: 14
Plan Total Fractions Prescribed: 28
Plan Total Prescribed Dose: 25.2 Gy
Plan Total Prescribed Dose: 50.4 Gy
Reference Point Dosage Given to Date: 12.6 Gy
Reference Point Dosage Given to Date: 7.2 Gy
Reference Point Session Dosage Given: 1.8 Gy
Reference Point Session Dosage Given: 1.8 Gy
Session Number: 7

## 2024-02-29 ENCOUNTER — Other Ambulatory Visit: Payer: Self-pay

## 2024-02-29 ENCOUNTER — Ambulatory Visit
Admission: RE | Admit: 2024-02-29 | Discharge: 2024-02-29 | Disposition: A | Discharge status: Home / Self Care | Source: Ambulatory Visit | Attending: Radiation Oncology

## 2024-02-29 DIAGNOSIS — Z5112 Encounter for antineoplastic immunotherapy: Secondary | ICD-10-CM | POA: Diagnosis not present

## 2024-02-29 LAB — RAD ONC ARIA SESSION SUMMARY
Course Elapsed Days: 10
Plan Fractions Treated to Date: 4
Plan Fractions Treated to Date: 8
Plan Prescribed Dose Per Fraction: 1.8 Gy
Plan Prescribed Dose Per Fraction: 1.8 Gy
Plan Total Fractions Prescribed: 14
Plan Total Fractions Prescribed: 28
Plan Total Prescribed Dose: 25.2 Gy
Plan Total Prescribed Dose: 50.4 Gy
Reference Point Dosage Given to Date: 14.4 Gy
Reference Point Dosage Given to Date: 7.2 Gy
Reference Point Session Dosage Given: 1.8 Gy
Reference Point Session Dosage Given: 1.8 Gy
Session Number: 8

## 2024-03-01 ENCOUNTER — Ambulatory Visit
Admission: RE | Admit: 2024-03-01 | Discharge: 2024-03-01 | Disposition: A | Discharge status: Home / Self Care | Source: Ambulatory Visit | Attending: Radiation Oncology | Admitting: Radiation Oncology

## 2024-03-01 ENCOUNTER — Encounter: Payer: Self-pay | Admitting: *Deleted

## 2024-03-01 ENCOUNTER — Other Ambulatory Visit: Payer: Self-pay

## 2024-03-01 DIAGNOSIS — Z5112 Encounter for antineoplastic immunotherapy: Secondary | ICD-10-CM | POA: Diagnosis not present

## 2024-03-01 LAB — RAD ONC ARIA SESSION SUMMARY
Course Elapsed Days: 11
Plan Fractions Treated to Date: 5
Plan Fractions Treated to Date: 9
Plan Prescribed Dose Per Fraction: 1.8 Gy
Plan Prescribed Dose Per Fraction: 1.8 Gy
Plan Total Fractions Prescribed: 14
Plan Total Fractions Prescribed: 28
Plan Total Prescribed Dose: 25.2 Gy
Plan Total Prescribed Dose: 50.4 Gy
Reference Point Dosage Given to Date: 16.2 Gy
Reference Point Dosage Given to Date: 9 Gy
Reference Point Session Dosage Given: 1.8 Gy
Reference Point Session Dosage Given: 1.8 Gy
Session Number: 9

## 2024-03-04 ENCOUNTER — Ambulatory Visit

## 2024-03-05 ENCOUNTER — Other Ambulatory Visit: Payer: Self-pay

## 2024-03-05 ENCOUNTER — Ambulatory Visit

## 2024-03-05 ENCOUNTER — Ambulatory Visit
Admission: RE | Admit: 2024-03-05 | Discharge: 2024-03-05 | Disposition: A | Discharge status: Home / Self Care | Source: Ambulatory Visit | Attending: Radiation Oncology | Admitting: Radiation Oncology

## 2024-03-05 DIAGNOSIS — N6091 Unspecified benign mammary dysplasia of right breast: Secondary | ICD-10-CM | POA: Insufficient documentation

## 2024-03-05 DIAGNOSIS — C50912 Malignant neoplasm of unspecified site of left female breast: Secondary | ICD-10-CM | POA: Insufficient documentation

## 2024-03-05 DIAGNOSIS — Z171 Estrogen receptor negative status [ER-]: Secondary | ICD-10-CM | POA: Insufficient documentation

## 2024-03-05 DIAGNOSIS — Z79899 Other long term (current) drug therapy: Secondary | ICD-10-CM | POA: Insufficient documentation

## 2024-03-05 LAB — RAD ONC ARIA SESSION SUMMARY
Course Elapsed Days: 15
Plan Fractions Treated to Date: 10
Plan Fractions Treated to Date: 5
Plan Prescribed Dose Per Fraction: 1.8 Gy
Plan Prescribed Dose Per Fraction: 1.8 Gy
Plan Total Fractions Prescribed: 14
Plan Total Fractions Prescribed: 28
Plan Total Prescribed Dose: 25.2 Gy
Plan Total Prescribed Dose: 50.4 Gy
Reference Point Dosage Given to Date: 18 Gy
Reference Point Dosage Given to Date: 9 Gy
Reference Point Session Dosage Given: 1.8 Gy
Reference Point Session Dosage Given: 1.8 Gy
Session Number: 10

## 2024-03-06 ENCOUNTER — Other Ambulatory Visit: Payer: Self-pay

## 2024-03-06 ENCOUNTER — Telehealth: Payer: Self-pay | Admitting: *Deleted

## 2024-03-06 ENCOUNTER — Ambulatory Visit
Admission: RE | Admit: 2024-03-06 | Discharge: 2024-03-06 | Disposition: A | Discharge status: Home / Self Care | Source: Ambulatory Visit | Attending: Radiation Oncology

## 2024-03-06 ENCOUNTER — Ambulatory Visit

## 2024-03-06 DIAGNOSIS — C50912 Malignant neoplasm of unspecified site of left female breast: Secondary | ICD-10-CM | POA: Diagnosis not present

## 2024-03-06 LAB — RAD ONC ARIA SESSION SUMMARY
Course Elapsed Days: 16
Plan Fractions Treated to Date: 11
Plan Fractions Treated to Date: 6
Plan Prescribed Dose Per Fraction: 1.8 Gy
Plan Prescribed Dose Per Fraction: 1.8 Gy
Plan Total Fractions Prescribed: 14
Plan Total Fractions Prescribed: 28
Plan Total Prescribed Dose: 25.2 Gy
Plan Total Prescribed Dose: 50.4 Gy
Reference Point Dosage Given to Date: 10.8 Gy
Reference Point Dosage Given to Date: 19.8 Gy
Reference Point Session Dosage Given: 1.8 Gy
Reference Point Session Dosage Given: 1.8 Gy
Session Number: 11

## 2024-03-06 NOTE — Telephone Encounter (Signed)
 This RN spoke with pt per her call stating she has developed " blurred vision " yesterday with improvement today - Note this symptom occurred with prior cycle - which lasted longer ( 4 days ).  She states today she is having aches in her lower arms bilaterally and her lower back.  She denies chills or fever.  She states she " has no taste or appetite "  No issues with bowels or bladder.  This RN reviewed pt's chart and discussed use of low dose ibuprofen for benefit as well as overall need primarily is hydration.  Rebecca Steele states she feels she can hydrate well and included fluids such as soups and sugar based liquids.  This note will be forwarded to MD

## 2024-03-07 ENCOUNTER — Encounter: Payer: Self-pay | Admitting: Hematology and Oncology

## 2024-03-07 ENCOUNTER — Other Ambulatory Visit: Payer: Self-pay | Admitting: *Deleted

## 2024-03-07 ENCOUNTER — Ambulatory Visit
Admission: RE | Admit: 2024-03-07 | Discharge: 2024-03-07 | Disposition: A | Discharge status: Home / Self Care | Source: Ambulatory Visit | Attending: Radiation Oncology | Admitting: Radiation Oncology

## 2024-03-07 ENCOUNTER — Other Ambulatory Visit: Payer: Self-pay

## 2024-03-07 ENCOUNTER — Ambulatory Visit

## 2024-03-07 DIAGNOSIS — R112 Nausea with vomiting, unspecified: Secondary | ICD-10-CM | POA: Diagnosis not present

## 2024-03-07 DIAGNOSIS — K529 Noninfective gastroenteritis and colitis, unspecified: Secondary | ICD-10-CM | POA: Diagnosis not present

## 2024-03-07 LAB — RAD ONC ARIA SESSION SUMMARY
Course Elapsed Days: 17
Plan Fractions Treated to Date: 12
Plan Fractions Treated to Date: 6
Plan Prescribed Dose Per Fraction: 1.8 Gy
Plan Prescribed Dose Per Fraction: 1.8 Gy
Plan Total Fractions Prescribed: 14
Plan Total Fractions Prescribed: 28
Plan Total Prescribed Dose: 25.2 Gy
Plan Total Prescribed Dose: 50.4 Gy
Reference Point Dosage Given to Date: 10.8 Gy
Reference Point Dosage Given to Date: 21.6 Gy
Reference Point Session Dosage Given: 1.8 Gy
Reference Point Session Dosage Given: 1.8 Gy
Session Number: 12

## 2024-03-08 ENCOUNTER — Ambulatory Visit
Admission: RE | Admit: 2024-03-08 | Discharge: 2024-03-08 | Disposition: A | Discharge status: Home / Self Care | Source: Ambulatory Visit | Attending: Radiation Oncology | Admitting: Radiation Oncology

## 2024-03-08 ENCOUNTER — Encounter (HOSPITAL_COMMUNITY): Payer: Self-pay

## 2024-03-08 ENCOUNTER — Other Ambulatory Visit: Payer: Self-pay

## 2024-03-08 ENCOUNTER — Emergency Department (HOSPITAL_COMMUNITY)

## 2024-03-08 ENCOUNTER — Inpatient Hospital Stay

## 2024-03-08 ENCOUNTER — Emergency Department (HOSPITAL_COMMUNITY)
Admission: EM | Admit: 2024-03-08 | Discharge: 2024-03-08 | Disposition: A | Discharge status: Home / Self Care | Source: Home / Self Care | Attending: Emergency Medicine | Admitting: Emergency Medicine

## 2024-03-08 ENCOUNTER — Ambulatory Visit: Admitting: Radiation Oncology

## 2024-03-08 ENCOUNTER — Ambulatory Visit: Admitting: Physician Assistant

## 2024-03-08 ENCOUNTER — Other Ambulatory Visit: Payer: Self-pay | Admitting: Radiation Oncology

## 2024-03-08 ENCOUNTER — Ambulatory Visit

## 2024-03-08 VITALS — BP 151/92 | HR 91 | Temp 98.1°F | Resp 18 | Wt 184.0 lb

## 2024-03-08 VITALS — BP 151/92 | HR 91 | Temp 98.1°F | Resp 18

## 2024-03-08 DIAGNOSIS — R112 Nausea with vomiting, unspecified: Secondary | ICD-10-CM

## 2024-03-08 DIAGNOSIS — R109 Unspecified abdominal pain: Secondary | ICD-10-CM

## 2024-03-08 DIAGNOSIS — Z1721 Progesterone receptor positive status: Secondary | ICD-10-CM | POA: Insufficient documentation

## 2024-03-08 DIAGNOSIS — E860 Dehydration: Secondary | ICD-10-CM | POA: Insufficient documentation

## 2024-03-08 DIAGNOSIS — Z79899 Other long term (current) drug therapy: Secondary | ICD-10-CM | POA: Insufficient documentation

## 2024-03-08 DIAGNOSIS — R1033 Periumbilical pain: Secondary | ICD-10-CM | POA: Insufficient documentation

## 2024-03-08 DIAGNOSIS — E876 Hypokalemia: Secondary | ICD-10-CM | POA: Diagnosis not present

## 2024-03-08 DIAGNOSIS — Z1731 Human epidermal growth factor receptor 2 positive status: Secondary | ICD-10-CM | POA: Insufficient documentation

## 2024-03-08 DIAGNOSIS — C50919 Malignant neoplasm of unspecified site of unspecified female breast: Secondary | ICD-10-CM | POA: Insufficient documentation

## 2024-03-08 DIAGNOSIS — Z5112 Encounter for antineoplastic immunotherapy: Secondary | ICD-10-CM | POA: Insufficient documentation

## 2024-03-08 DIAGNOSIS — C773 Secondary and unspecified malignant neoplasm of axilla and upper limb lymph nodes: Secondary | ICD-10-CM | POA: Insufficient documentation

## 2024-03-08 DIAGNOSIS — T451X5A Adverse effect of antineoplastic and immunosuppressive drugs, initial encounter: Secondary | ICD-10-CM | POA: Insufficient documentation

## 2024-03-08 DIAGNOSIS — Z171 Estrogen receptor negative status [ER-]: Secondary | ICD-10-CM

## 2024-03-08 DIAGNOSIS — L03313 Cellulitis of chest wall: Secondary | ICD-10-CM | POA: Insufficient documentation

## 2024-03-08 DIAGNOSIS — H532 Diplopia: Secondary | ICD-10-CM | POA: Insufficient documentation

## 2024-03-08 DIAGNOSIS — C50212 Malignant neoplasm of upper-inner quadrant of left female breast: Secondary | ICD-10-CM

## 2024-03-08 DIAGNOSIS — K529 Noninfective gastroenteritis and colitis, unspecified: Secondary | ICD-10-CM | POA: Insufficient documentation

## 2024-03-08 LAB — COMPREHENSIVE METABOLIC PANEL WITH GFR
ALT: 23 U/L (ref 0–44)
AST: 35 U/L (ref 15–41)
Albumin: 3.7 g/dL (ref 3.5–5.0)
Alkaline Phosphatase: 95 U/L (ref 38–126)
Anion gap: 10 (ref 5–15)
BUN: 10 mg/dL (ref 8–23)
CO2: 23 mmol/L (ref 22–32)
Calcium: 9.3 mg/dL (ref 8.9–10.3)
Chloride: 103 mmol/L (ref 98–111)
Creatinine, Ser: 0.86 mg/dL (ref 0.44–1.00)
GFR, Estimated: >60 mL/min (ref >60)
Glucose, Bld: 120 mg/dL — ABNORMAL HIGH (ref 70–99)
Potassium: 3.6 mmol/L (ref 3.5–5.1)
Sodium: 136 mmol/L (ref 135–145)
Total Bilirubin: 1.3 mg/dL — ABNORMAL HIGH (ref 0.0–1.2)
Total Protein: 6.9 g/dL (ref 6.5–8.1)

## 2024-03-08 LAB — CMP (CANCER CENTER ONLY)
ALT: 22 U/L (ref 0–44)
AST: 34 U/L (ref 15–41)
Albumin: 4.6 g/dL (ref 3.5–5.0)
Alkaline Phosphatase: 113 U/L (ref 38–126)
Anion gap: 11 (ref 5–15)
BUN: 10 mg/dL (ref 8–23)
CO2: 26 mmol/L (ref 22–32)
Calcium: 10.4 mg/dL — ABNORMAL HIGH (ref 8.9–10.3)
Chloride: 101 mmol/L (ref 98–111)
Creatinine: 0.83 mg/dL (ref 0.44–1.00)
GFR, Estimated: >60 mL/min (ref >60)
Glucose, Bld: 118 mg/dL — ABNORMAL HIGH (ref 70–99)
Potassium: 3.3 mmol/L — ABNORMAL LOW (ref 3.5–5.1)
Sodium: 138 mmol/L (ref 135–145)
Total Bilirubin: 1.4 mg/dL — ABNORMAL HIGH (ref 0.0–1.2)
Total Protein: 7.6 g/dL (ref 6.5–8.1)

## 2024-03-08 LAB — CBC WITH DIFFERENTIAL (CANCER CENTER ONLY)
Abs Immature Granulocytes: 0.02 10*3/uL (ref 0.00–0.07)
Basophils Absolute: 0.1 10*3/uL (ref 0.0–0.1)
Basophils Relative: 1 %
Eosinophils Absolute: 0 10*3/uL (ref 0.0–0.5)
Eosinophils Relative: 0 %
HCT: 45.5 % (ref 36.0–46.0)
Hemoglobin: 16.2 g/dL — ABNORMAL HIGH (ref 12.0–15.0)
Immature Granulocytes: 0 %
Lymphocytes Relative: 13 %
Lymphs Abs: 1 10*3/uL (ref 0.7–4.0)
MCH: 30.7 pg (ref 26.0–34.0)
MCHC: 35.6 g/dL (ref 30.0–36.0)
MCV: 86.2 fL (ref 80.0–100.0)
Monocytes Absolute: 1.2 10*3/uL — ABNORMAL HIGH (ref 0.1–1.0)
Monocytes Relative: 16 %
Neutro Abs: 5.2 10*3/uL (ref 1.7–7.7)
Neutrophils Relative %: 70 %
Platelet Count: 199 10*3/uL (ref 150–400)
RBC: 5.28 MIL/uL — ABNORMAL HIGH (ref 3.87–5.11)
RDW: 13.6 % (ref 11.5–15.5)
WBC Count: 7.4 10*3/uL (ref 4.0–10.5)
nRBC: 0 % (ref 0.0–0.2)

## 2024-03-08 LAB — RAD ONC ARIA SESSION SUMMARY
Course Elapsed Days: 18
Plan Fractions Treated to Date: 13
Plan Fractions Treated to Date: 7
Plan Prescribed Dose Per Fraction: 1.8 Gy
Plan Prescribed Dose Per Fraction: 1.8 Gy
Plan Total Fractions Prescribed: 14
Plan Total Fractions Prescribed: 28
Plan Total Prescribed Dose: 25.2 Gy
Plan Total Prescribed Dose: 50.4 Gy
Reference Point Dosage Given to Date: 12.6 Gy
Reference Point Dosage Given to Date: 23.4 Gy
Reference Point Session Dosage Given: 1.8 Gy
Reference Point Session Dosage Given: 1.8 Gy
Session Number: 13

## 2024-03-08 LAB — CBC WITH DIFFERENTIAL/PLATELET
Abs Immature Granulocytes: 0.03 10*3/uL (ref 0.00–0.07)
Basophils Absolute: 0.1 10*3/uL (ref 0.0–0.1)
Basophils Relative: 1 %
Eosinophils Absolute: 0 10*3/uL (ref 0.0–0.5)
Eosinophils Relative: 0 %
HCT: 44.9 % (ref 36.0–46.0)
Hemoglobin: 15.3 g/dL — ABNORMAL HIGH (ref 12.0–15.0)
Immature Granulocytes: 0 %
Lymphocytes Relative: 10 %
Lymphs Abs: 0.7 10*3/uL (ref 0.7–4.0)
MCH: 30.7 pg (ref 26.0–34.0)
MCHC: 34.1 g/dL (ref 30.0–36.0)
MCV: 90.2 fL (ref 80.0–100.0)
Monocytes Absolute: 1 10*3/uL (ref 0.1–1.0)
Monocytes Relative: 13 %
Neutro Abs: 5.5 10*3/uL (ref 1.7–7.7)
Neutrophils Relative %: 76 %
Platelets: 171 10*3/uL (ref 150–400)
RBC: 4.98 MIL/uL (ref 3.87–5.11)
RDW: 13.5 % (ref 11.5–15.5)
WBC: 7.3 10*3/uL (ref 4.0–10.5)
nRBC: 0 % (ref 0.0–0.2)

## 2024-03-08 LAB — MAGNESIUM: Magnesium: 1.9 mg/dL (ref 1.7–2.4)

## 2024-03-08 LAB — LIPASE, BLOOD: Lipase: 35 U/L (ref 11–51)

## 2024-03-08 MED ORDER — METOCLOPRAMIDE HCL 5 MG/ML IJ SOLN
10.0000 mg | Freq: Once | INTRAMUSCULAR | Status: AC
  Administered 2024-03-08: 10 mg via INTRAVENOUS
  Filled 2024-03-08: qty 2

## 2024-03-08 MED ORDER — SODIUM CHLORIDE 0.9 % IV SOLN
12.5000 mg | Freq: Once | INTRAVENOUS | Status: AC
  Administered 2024-03-08: 12.5 mg via INTRAVENOUS
  Filled 2024-03-08: qty 0.5

## 2024-03-08 MED ORDER — FAMOTIDINE IN NACL 20-0.9 MG/50ML-% IV SOLN
20.0000 mg | Freq: Once | INTRAVENOUS | Status: AC
  Administered 2024-03-08: 20 mg via INTRAVENOUS
  Filled 2024-03-08: qty 50

## 2024-03-08 MED ORDER — SODIUM CHLORIDE 0.9 % IV BOLUS
1000.0000 mL | Freq: Once | INTRAVENOUS | Status: AC
  Administered 2024-03-08: 1000 mL via INTRAVENOUS

## 2024-03-08 MED ORDER — HYDROMORPHONE HCL 1 MG/ML IJ SOLN
1.0000 mg | Freq: Once | INTRAMUSCULAR | Status: AC
  Administered 2024-03-08: 1 mg via INTRAVENOUS
  Filled 2024-03-08: qty 1

## 2024-03-08 MED ORDER — PROCHLORPERAZINE EDISYLATE 10 MG/2ML IJ SOLN
10.0000 mg | Freq: Once | INTRAMUSCULAR | Status: AC
  Administered 2024-03-08: 10 mg via INTRAVENOUS
  Filled 2024-03-08: qty 2

## 2024-03-08 MED ORDER — HEPARIN SOD (PORK) LOCK FLUSH 100 UNIT/ML IV SOLN
500.0000 [IU] | Freq: Once | INTRAVENOUS | Status: AC
  Administered 2024-03-08: 500 [IU]
  Filled 2024-03-08: qty 5

## 2024-03-08 MED ORDER — ONDANSETRON HCL 4 MG/2ML IJ SOLN
8.0000 mg | Freq: Once | INTRAMUSCULAR | Status: AC
  Administered 2024-03-08: 8 mg via INTRAVENOUS
  Filled 2024-03-08: qty 4

## 2024-03-08 MED ORDER — DIPHENHYDRAMINE HCL 50 MG/ML IJ SOLN
25.0000 mg | Freq: Once | INTRAMUSCULAR | Status: AC
  Administered 2024-03-08: 25 mg via INTRAVENOUS
  Filled 2024-03-08: qty 1

## 2024-03-08 MED ORDER — METOCLOPRAMIDE HCL 10 MG PO TABS: 10.0000 mg | ORAL_TABLET | Freq: Four times a day (QID) | ORAL | 0 refills | Status: DC | PRN

## 2024-03-08 MED ORDER — ONDANSETRON 8 MG PO TBDP: 8.0000 mg | ORAL_TABLET | Freq: Three times a day (TID) | ORAL | 1 refills | Status: DC | PRN

## 2024-03-08 MED ORDER — POTASSIUM CHLORIDE 10 MEQ/100ML IV SOLN
10.0000 meq | Freq: Once | INTRAVENOUS | Status: AC
  Administered 2024-03-08: 10 meq via INTRAVENOUS
  Filled 2024-03-08: qty 100

## 2024-03-08 MED ORDER — SODIUM CHLORIDE 0.9 % IV SOLN: Freq: Once | INTRAVENOUS | Status: AC

## 2024-03-08 NOTE — Patient Instructions (Signed)
 Nausea and Vomiting, Adult Nausea is feeling that you have an upset stomach and that you are about to vomit. Vomiting is when food in your stomach forcefully comes out of your mouth. Vomiting can make you feel weak. If you vomit, or if you are not able to drink enough fluids, you may not have enough water in your body (get dehydrated). If you do not have enough water in your body, you may: Feel tired. Feel thirsty. Have a dry mouth. Have cracked lips. Pee (urinate) less often. Older adults and people with other diseases or a weak body defense system (immune system) are at higher risk for not having enough water in the body. If you feel like you may vomit or you vomit, it is important to follow instructions from your doctor about how to take care of yourself. Follow these instructions at home: Watch your symptoms for any changes. Tell your doctor about them. Eating and drinking     Take an ORS (oral rehydration solution). This is a drink that is sold at pharmacies and stores. Drink clear fluids in small amounts as you are able, such as: Water. Ice chips. Fruit juice that has water added (diluted fruit juice). Low-calorie sports drinks. Eat bland, easy-to-digest foods in small amounts as you are able, such as: Bananas. Applesauce. Rice. Low-fat (lean) meats. Toast. Crackers. Avoid drinking fluids that have a lot of sugar or caffeine in them. This includes energy drinks, sports drinks, and soda. Avoid alcohol. Avoid spicy or fatty foods. General instructions Take over-the-counter and prescription medicines only as told by your doctor. Drink enough fluid to keep your pee (urine) pale yellow. Wash your hands often with soap and water for at least 20 seconds. If you cannot use soap and water, use hand sanitizer. Make sure that everyone in your home washes their hands well and often. Rest at home until you feel better. Watch your condition for any changes. Take slow and deep breaths  when you feel like you may vomit. Keep all follow-up visits. Contact a doctor if: Your symptoms get worse. You have new symptoms. You have a fever. You cannot drink fluids without vomiting. You feel like you may vomit for more than 2 days. You feel light-headed or dizzy. You have a headache. You have muscle cramps. You have a rash. You have pain while peeing. Get help right away if: You have pain in your chest, neck, arm, or jaw. You feel very weak or you faint. You vomit again and again. You have vomit that is bright red or looks like black coffee grounds. You have bloody or black poop (stools) or poop that looks like tar. You have a very bad headache, a stiff neck, or both. You have very bad pain, cramping, or bloating in your belly (abdomen). You have trouble breathing. You are breathing very quickly. Your heart is beating very quickly. Your skin feels cold and clammy. You feel confused. You have signs of losing too much water in your body, such as: Dark pee, very little pee, or no pee. Cracked lips. Dry mouth. Sunken eyes. Sleepiness. Weakness. These symptoms may be an emergency. Get help right away. Call 911. Do not wait to see if the symptoms will go away. Do not drive yourself to the hospital. Summary Nausea is feeling that you have an upset stomach and that you are about to vomit. Vomiting is when food in your stomach comes out of your mouth. Follow instructions from your doctor about eating and drinking.  Take over-the-counter and prescription medicines only as told by your doctor. Contact your doctor if your symptoms get worse or you have new symptoms. Keep all follow-up visits. This information is not intended to replace advice given to you by your health care provider. Make sure you discuss any questions you have with your health care provider. Document Revised: 03/26/2021 Document Reviewed: 03/26/2021 Elsevier Patient Education  2024 [employer].

## 2024-03-08 NOTE — Discharge Instructions (Addendum)
 As we discussed, your labs have improved  Please continue taking Compazine  as prescribed and I have added Reglan 10 mg every 6 hours as needed  Please stay hydrated  As we discussed, you may need IV fluids occasionally if you get dehydrated  Please follow-up with your oncologist next week  Return to ER if you have severe abdominal pain or vomiting or fever

## 2024-03-08 NOTE — ED Notes (Signed)
 Patient taken to CT.

## 2024-03-08 NOTE — ED Notes (Signed)
 Patient given water for PO challenge.

## 2024-03-08 NOTE — ED Triage Notes (Addendum)
 Patient sent from cancer center. Has had generalized abdominal pain for 3 days. Nausea and vomiting. No diarrhea. Cancer center took labs, began 1 bag of fluid, potassium IV, phenergan  IV, 8mg  zofran , pepcid . No change in symptoms after receiving medications. Getting chemo currently. Port accessed on arrival.

## 2024-03-08 NOTE — Progress Notes (Signed)
 Pt arrived to [facility] lobby to check in for an infusion appt for labs, IVF and antiemetics. She is accompanied by her husband. No appt found on pt's appt desk other than scheduled xrt. She is visibly weak, pale, reports nausea despite taking Zofran , states she hasn't eaten in 3 days, unable to keep PO fluids down. States she was advised by Tarri Farm, RN to come in today for IVF. She reports if she is not able to get fluids at [facility] she will go to ED. S/w [facility] PA and Infusion charge, RN who state pt can be seen in infusion at 1430 d/t a cancellation. Pt was transported to infusion via w/c d/t weakness and she had an episode of vomiting.

## 2024-03-08 NOTE — Progress Notes (Addendum)
 Symptom Management Consult Note [facility]    Patient Care Team: Louis Row, MD as PCP - General (Internal Medicine) Enid Harry, MD as Consulting Physician (General Surgery) Thora Flint, MD as Consulting Physician (Obstetrics and Gynecology) Delilah Fend, MD (Inactive) as Consulting Physician (Gastroenterology) Murleen Arms, MD as Consulting Physician (Hematology and Oncology) Auther Bo, RN as Oncology Nurse Navigator Alane Hsu, RN as Oncology Nurse Navigator    Name / MRN / DOB: Rebecca Steele  [id number]  October 24, 1950   Date of visit: 03/08/2024   Chief Complaint/Reason for visit: nausea and vomiting   Current Therapy: Kadcyla  with concurrent radiation  Last treatment:  Day 1   Cycle 3 on 02/27/24    ASSESSMENT AND PLAN Patient is a 73 y.o. 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 Arno Bibles.  I have viewed most recent oncology note and lab work.  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 - Patient looks to not feel well although is non toxic appearing. Diffuse abdominal tenderness. VSS. - Patient received 1L NS with 8mg  IV zofran  , 20 mg IV pepcid , and  12.5 mg IV phenergan  for symptom management. After multiple reassessments patient denies symptom improvement. - CBC shows WBC WNL. CMP shows mild hypokalemia 3.3. Patient received 1 run of IV potassium for replacement. No other significant electrolyte derangement or renal insufficiency.  - Engaged in shared decision making with patient. With how poorly she is feeling she does not feel she can manage her symptoms at home over the weekend. Patient will be taken to the ED for further evaluation. Report given to accepting RN.  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)   10/20/2023 - 10/23/2023 Chemotherapy   Patient is on Treatment Plan : BREAST  Docetaxel  + Carboplatin  + Trastuzumab  + Pertuzumab   (TCHP) q21d      11/24/2023 Cancer Staging   Staging form: Breast, AJCC 8th Edition - Pathologic stage from 11/24/2023: ypT2, pN1, cM0, G3, ER-, PR-, HER2+ - Signed by Murleen Arms, MD on 11/24/2023 Stage prefix: Post-therapy Histologic grading system: 3 grade system   01/16/2024 -  Chemotherapy   Patient is on Treatment Plan : BREAST ADO-Trastuzumab Emtansine  (Kadcyla ) q21d         INTERVAL HISTORY  Discussed the use of AI scribe software for clinical note transcription with the patient, who gave verbal consent to proceed.    Rebecca Steele is a 73 y.o. female with oncologic history as  above presenting to [facility] today with chief complaint of with nausea and vomiting. Patient presents unaccompanied to visit today.  She has experienced significant nausea and vomiting, with six episodes of vomiting since yesterday after smelling [employer]. Her last meal was x 3 days ago, consisting of half a hamburger, and she has not eaten or drunk much  since then. She also admits to feeling weak.  She attempted to manage her nausea with Compazine  this morning, but it did not stay down. Approximately 45 minutes before the visit, she took Zofran , possibly 8 mg however immediately vomited. She has not taken any other Zofran  today. She denies diarrhea and states bowel movements have been normal.  She reports a constant 'heart band' feeling in her stomach for the past couple of days.  She admits to chills since symptom onset, denies fever.. She has not been able to keep any fluids down at home. Denies symptoms of a UTI. Abdominal surgical history includes cholecystectomy and appendectomy.  She experiences cramps in her back, not associated with vomiting or specific movements, and her mouth is very dry. She is currently in her third week of radiation therapy and wonders if getting on and off the table for treatment today caused the cramping.       ROS  All other systems are reviewed and are negative for acute change except as noted in the HPI.    Allergies  Allergen Reactions   Gadolinium Derivatives Anaphylaxis and Shortness Of Breath    Pt was given 17ml multihance  for MRI. After about 71min30 seconds she squeezed the ball saying she felt like her throat was closing up. Exam was DC'd and radiologist gave 75mg  benadryl  IV.    Other     Pt states she was given Gabapentin  and Fentanyl  for anesthesia during recent surgery and was unable to wake up for several hours.  Pt states she does not want to have sedation with those medications in the future.   Valium Anaphylaxis   Paxlovid [Nirmatrelvir-Ritonavir] Hives   Doxycycline  Swelling    Facial swelling Pt has since had without problem   Fentanyl  Other (See Comments)    Slept the whole time for few days   Iohexol  Itching     Desc: Pt. stated she had iv contrast around 25 yrs ago and had itching on her neck.  she took benadryl  and it went away.  The rad recommended premeds and be done tomorrow  but the pt.did not want to do that so we did her w/o iv contrast today., Onset Date: [id number]    Macrodantin Nausea And Vomiting   Red Dye #40 (Allura Red) Swelling    Facial swelling (cannot take pink or red tablets or capsules)   Rocephin [Ceftriaxone] Other (See Comments)    Joint aches   Zithromax [Azithromycin] Other (See Comments)    Weakness, cold/clammy, legs trembling     Past Medical History:  Diagnosis Date   Breast mass, right    Cancer (HCC) 10/2023   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 states she was given gabapentin  and fentanyl  with breast surgery and was diff to wake up   Depression    History of kidney stones    Hypothyroidism    Mitral valve prolapse    Sleep apnea    HAS MILD OSA, NO CPAP NEEDED     Past  Surgical History:  Procedure Laterality Date   APPENDECTOMY     BREAST BIOPSY Right 09/16/2022   MM RT BREAST BX W LOC DEV 1ST LESION IMAGE BX SPEC STEREO GUIDE 09/16/2022 [facility]   BREAST BIOPSY  12/06/2022   MM RT RADIOACTIVE SEED LOC MAMMO GUIDE 12/06/2022 [facility]   BREAST BIOPSY Left 11/16/2023   US  LT RADIOACTIVE SEED LOC 11/16/2023 [facility]   BREAST BIOPSY  11/16/2023   MM LT RADIOACTIVE SEED EA ADD LESION LOC MAMMO GUIDE 11/16/2023 [facility]   BREAST EXCISIONAL BIOPSY Right 2018   BREAST LUMPECTOMY WITH RADIOACTIVE SEED AND SENTINEL LYMPH NODE BIOPSY Left 11/20/2023   Procedure: LEFT BREAST SEED GUIDED LUMPECTOMY, LEFT AXILLARY SENTINEL NODE BIOPSY;  Surgeon: Enid Harry, MD;  Location: [facility];  Service: General;  Laterality: Left;  PEC BLOCK   CHOLECYSTECTOMY     CYSTOSCOPY W/ RETROGRADES     KYPHOPLASTY N/A 10/05/2021   Procedure: LUMBAR ONE KYPHOPLASTY;  Surgeon: Cannon Champion, MD;  Location: [facility];  Service: Neurosurgery;  Laterality: N/A;   NECK SURGERY     PORTACATH  PLACEMENT Right 10/12/2023   Procedure: INSERTION PORT-A-CATH WITH GUIDED ULTRASOUND;  Surgeon: Enid Harry, MD;  Location: [facility];  Service: General;  Laterality: Right;   RADIOACTIVE SEED GUIDED AXILLARY SENTINEL LYMPH NODE Left 11/20/2023   Procedure: LEFT AXILLARY NODE SEED GUIDED EXCISION;  Surgeon: Enid Harry, MD;  Location: [facility];  Service: General;  Laterality: Left;   RADIOACTIVE SEED GUIDED EXCISIONAL BREAST BIOPSY Right 12/12/2017   Procedure: RIGHT RADIOACTIVE SEED GUIDED EXCISIONAL BREAST BIOPSY ERAS PATHWAY;  Surgeon: Enid Harry, MD;  Location: [facility];  Service: General;  Laterality: Right;  LMA   RADIOACTIVE SEED GUIDED EXCISIONAL BREAST BIOPSY Right 12/07/2022   Procedure: RADIOACTIVE SEED GUIDED EXCISIONAL RIGHT BREAST BIOPSY;  Surgeon: Enid Harry, MD;  Location: [facility];  Service: General;  Laterality: Right;   SHOULDER SURGERY     SIMPLE MASTECTOMY WITH AXILLARY SENTINEL NODE BIOPSY Left 12/12/2023   Procedure: LEFT  MASTECTOMY;  Surgeon: Enid Harry, MD;  Location: [facility];  Service: General;  Laterality: Left;   TONSILLECTOMY     TOTAL HIP ARTHROPLASTY Left 06/04/2021   Procedure: TOTAL HIP ARTHROPLASTY ANTERIOR APPROACH;  Surgeon: Neil Balls, MD;  Location: [facility];  Service: Orthopedics;  Laterality: Left;   TRIGGER FINGER RELEASE Right 05/18/2015   Procedure: RIGHT  LONG FINGER TRIGGER RELEASE ;  Surgeon: Rober Chimera, MD;  Location:  SUR[facility]ERY C[facility];  Service: Orthopedics;  Laterality: Right;    Social History   Socioeconomic History   Marital status: Married    Spouse name: Not on file   Number of children: Not on file   Years of education: Not on file   Highest education level: Not on file  Occupational History   Not on file  Tobacco Use   Smoking status: Never   Smokeless tobacco: Never  Vaping Use   Vaping status: Never  Used  Substance and Sexual Activity   Alcohol  use: No   Drug use: No   Sexual activity: Not Currently    Birth control/protection: Post-menopausal  Other Topics Concern   Not on file  Social History Narrative   Not on file   Social Drivers of Health   Financial Resource Strain: Low Risk  (01/01/2024)   Received from [facility]   Overall Financial Resource Strain (CARDIA)    Difficulty  of Paying Living Expenses: Not hard at all  Food Insecurity: No Food Insecurity (01/12/2024)   Hunger Vital Sign    Worried About Running Out of Food in the Last Year: Never true    Ran Out of Food in the Last Year: Never true  Transportation Needs: No Transportation Needs (01/01/2024)   Received from [facility] - Transportation    Lack of Transportation (Medical): No    Lack of Transportation (Non-Medical): No  Physical Activity: Inactive (04/24/2023)   Received from [facility]   Exercise Vital Sign    Days of Exercise per Week: 0 days    Minutes of Exercise per Session: 10 min  Stress: No Stress Concern Present (04/24/2023)   Received from [facility] - Occupational Stress Questionnaire    Feeling of Stress : Not at all  Social Connections: Socially Integrated (04/24/2023)   Received from [facility]   Social Network    How would you rate your social network (family, work, friends)?: Good participation with social networks  Intimate Partner Violence: Not At Risk (12/05/2023)   Humiliation, Afraid, Rape, and Kick questionnaire    Fear of Current or Ex-Partner: No    Emotionally Abused: No    Physically Abused: No    Sexually Abused: No    Family History  Problem Relation Age of Onset   Breast cancer Maternal Aunt        80s     Current Outpatient Medications:    butalbital -acetaminophen -caffeine  (FIORICET) 50-325-40 MG tablet, Take 1 tablet by mouth 2 (two) times daily as needed for headache or migraine., Disp: , Rfl:     levothyroxine  (SYNTHROID ) 25 MCG tablet, Take 25 mcg by mouth daily before breakfast., Disp: , Rfl:    lidocaine -prilocaine  (EMLA ) cream, Apply to affected area once, Disp: 30 g, Rfl: 3   loratadine  (CLARITIN ) 10 MG tablet, Take 10 mg by mouth daily., Disp: , Rfl:    methocarbamol  (ROBAXIN ) 500 MG tablet, Take 1 tablet (500 mg total) by mouth every 8 (eight) hours as needed for muscle spasms., Disp: 30 tablet, Rfl: 0   montelukast  (SINGULAIR ) 10 MG tablet, Take 10 mg by mouth at bedtime., Disp: , Rfl:    ondansetron  (ZOFRAN -ODT) 8 MG disintegrating tablet, Take 1 tablet (8 mg total) by mouth every 8 (eight) hours as needed for nausea or vomiting., Disp: 30 tablet, Rfl: 1   prochlorperazine  (COMPAZINE ) 10 MG tablet, Take 1 tablet (10 mg total) by mouth every 6 (six) hours as needed for nausea or vomiting., Disp: 30 tablet, Rfl: 1   sertraline  (ZOLOFT ) 50 MG tablet, Take 50 mg by mouth daily., Disp: , Rfl:    traZODone  (DESYREL ) 100 MG tablet, Take 100 mg by mouth at bedtime., Disp: , Rfl:  No current facility-administered medications for this visit.  Facility-Administered Medications Ordered in Other Visits:    0.9 %  sodium chloride  infusion, , Intravenous, Once, Walisiewicz, Vinson Tietze E, PA-C, Last Rate: 500 mL/hr at 03/08/24 1540, Infusion Verify at 03/08/24 1540   potassium chloride  10 mEq in 100 mL IVPB, 10 mEq, Intravenous, Once, Walisiewicz, Timmey Lamba E, PA-C, Last Rate: 100 mL/hr at 03/08/24 1619, 10 mEq at 03/08/24 1619  PHYSICAL EXAM ECOG FS:1 - Symptomatic but completely ambulatory    Vitals:   03/08/24 1454  BP: (!) 151/92  Pulse: 91  Resp: 18  Temp: 98.1 F (36.7 C)  TempSrc: Oral  SpO2: 98%   Physical Exam Vitals  and nursing note reviewed.  Constitutional:      General: She is not in acute distress.    Appearance: She is not ill-appearing or toxic-appearing.     Comments: Looks to not feel well  HENT:     Head: Normocephalic.     Mouth/Throat:     Mouth: Mucous  membranes are dry.  Eyes:     Conjunctiva/sclera: Conjunctivae normal.  Cardiovascular:     Rate and Rhythm: Normal rate and regular rhythm.     Pulses: Normal pulses.     Heart sounds: Normal heart sounds.  Pulmonary:     Effort: Pulmonary effort is normal.     Breath sounds: Normal breath sounds.  Abdominal:     General: There is no distension.     Palpations: Abdomen is soft.     Tenderness: There is abdominal tenderness (diffuse). There is guarding. There is no right CVA tenderness, left CVA tenderness or rebound.  Musculoskeletal:     Cervical back: Normal range of motion.     Right lower leg: No edema.     Left lower leg: No edema.  Skin:    General: Skin is warm and dry.  Neurological:     Mental Status: She is alert.        LABORATORY DATA I have reviewed the data as listed    Latest Ref Rng & Units 03/08/2024    2:32 PM 02/27/2024    9:55 AM 02/06/2024   10:08 AM  CBC  WBC 4.0 - 10.5 K/uL 7.4  3.6  5.1   Hemoglobin 12.0 - 15.0 g/dL 08.6  57.8  46.9   Hematocrit 36.0 - 46.0 % 45.5  42.5  41.9   Platelets 150 - 400 K/uL 199  213  258         Latest Ref Rng & Units 03/08/2024    2:32 PM 02/27/2024    9:55 AM 02/06/2024   10:08 AM  CMP  Glucose 70 - 99 mg/dL 629  528  89   BUN 8 - 23 mg/dL 10  10  14    Creatinine 0.44 - 1.00 mg/dL 4.13  2.44  0.10   Sodium 135 - 145 mmol/L 138  141  142   Potassium 3.5 - 5.1 mmol/L 3.3  3.4  3.7   Chloride 98 - 111 mmol/L 101  107  109   CO2 22 - 32 mmol/L 26  27  30    Calcium  8.9 - 10.3 mg/dL 27.2  9.6  9.7   Total Protein 6.5 - 8.1 g/dL 7.6  6.5  6.4   Total Bilirubin 0.0 - 1.2 mg/dL 1.4  0.6  0.6   Alkaline Phos 38 - 126 U/L 113  85  82   AST 15 - 41 U/L 34  26  23   ALT 0 - 44 U/L 22  24  23         RADIOGRAPHIC STUDIES (from last 24 hours if applicable) I have personally reviewed the radiological images as listed and agreed with the findings in the report. No results found.      Visit Diagnosis: 1. Malignant  neoplasm of upper-inner quadrant of left breast in female, estrogen receptor negative (HCC)   2. Nausea and vomiting, unspecified vomiting type   3. Hypokalemia   4. Acute abdominal pain      No orders of the defined types were placed in this encounter.   All questions were answered. The patient knows to call the  clinic with any problems, questions or concerns. No barriers to learning was detected.  A total of more than 40 minutes were spent on this encounter with face-to-face time and non-face-to-face time, including preparing to see the patient, ordering tests and/or medications, counseling the patient and coordination of care as outlined above.    Thank you for allowing me to participate in the care of this patient.    Sya Nestler E  Walisiewicz, PA-C Department of Hematology/Oncology [facility] at [facility] Phone: [phone]  [phone]    03/08/2024 4:24 PM

## 2024-03-08 NOTE — ED Provider Notes (Signed)
 [place] [facility] Provider Note   CSN: [id number] Arrival date & time: 03/08/24  1654     History  Chief Complaint  Patient presents with   Abdominal Pain    Rebecca Steele is a 73 y.o. female history of breast cancer on chemo and radiation, here presenting with vomiting and abdominal pain.  Patient is currently getting daily radiation.  Patient was at the oncology office today and was complaining abdominal pain and vomiting.  Patient has labs checked and potassium was slightly low at 3.3.  Patient received Pepcid  and potassium and Zofran  and Phenergan  but still not feeling great.  Patient states that she still nauseated.  Also noticed some abdominal distention.  Patient was sent in from oncology clinic for further evaluation.  The history is provided by the patient.       Home Medications Prior to Admission medications   Medication Sig Start Date End Date Taking? Authorizing Provider  butalbital -acetaminophen -caffeine  (FIORICET) 50-325-40 MG tablet Take 1 tablet by mouth 2 (two) times daily as needed for headache or migraine.    [provider]  levothyroxine  (SYNTHROID ) 25 MCG tablet Take 25 mcg by mouth daily before breakfast.    [provider]  lidocaine -prilocaine  (EMLA ) cream Apply to affected area once 01/16/24   Iruku, Praveena, MD  loratadine  (CLARITIN ) 10 MG tablet Take 10 mg by mouth daily.    [provider]  methocarbamol  (ROBAXIN ) 500 MG tablet Take 1 tablet (500 mg total) by mouth every 8 (eight) hours as needed for muscle spasms. 12/13/23   Enid Harry, MD  montelukast  (SINGULAIR ) 10 MG tablet Take 10 mg by mouth at bedtime.    [provider]  ondansetron  (ZOFRAN -ODT) 8 MG disintegrating tablet Take 1 tablet (8 mg total) by mouth every 8 (eight) hours as needed for nausea or vomiting. 03/08/24   Johna Myers, MD  prochlorperazine  (COMPAZINE ) 10 MG tablet Take 1 tablet (10 mg total) by  mouth every 6 (six) hours as needed for nausea or vomiting. 01/16/24   Iruku, Praveena, MD  sertraline  (ZOLOFT ) 50 MG tablet Take 50 mg by mouth daily.    [provider]  traZODone  (DESYREL ) 100 MG tablet Take 100 mg by mouth at bedtime.    [provider]      Allergies    Gadolinium derivatives, Other, Valium, Paxlovid [nirmatrelvir-ritonavir], Doxycycline , Fentanyl , Iohexol , Macrodantin, Red dye #40 (allura red), Rocephin [ceftriaxone], and Zithromax [azithromycin]    Review of Systems   Review of Systems  Gastrointestinal:  Positive for abdominal pain and vomiting.  All other systems reviewed and are negative.   Physical Exam Updated Vital Signs BP (!) 164/78 (BP Location: Right Arm)   Pulse 86   Temp 98.3 F (36.8 C) (Oral)   Resp 18   Ht 5\' 5"  (1.651 m)   Wt 83.5 kg   SpO2 94%   BMI 30.62 kg/m  Physical Exam Vitals and nursing note reviewed.  Constitutional:      Comments: Currently ill and dehydrated  HENT:     Head: Normocephalic.     Mouth/Throat:     Pharynx: Oropharynx is clear.  Eyes:     Extraocular Movements: Extraocular movements intact.     Pupils: Pupils are equal, round, and reactive to light.  Cardiovascular:     Rate and Rhythm: Normal rate and regular rhythm.     Heart sounds: Normal heart sounds.  Pulmonary:     Effort: Pulmonary effort is  normal.     Breath sounds: Normal breath sounds.  Abdominal:     Comments: Mild periumbilical tenderness.  Skin:    General: Skin is warm.     Capillary Refill: Capillary refill takes less than 2 seconds.  Neurological:     General: No focal deficit present.     ED Results / Procedures / Treatments   Labs (all labs ordered are listed, but only abnormal results are displayed) Labs Reviewed  CBC WITH DIFFERENTIAL/PLATELET - Abnormal; Notable for the following components:      Result Value   Hemoglobin 15.3 (*)    All other components within normal limits  COMPREHENSIVE METABOLIC  PANEL WITH GFR - Abnormal; Notable for the following components:   Glucose, Bld 120 (*)    Total Bilirubin 1.3 (*)    All other components within normal limits  LIPASE, BLOOD    EKG None  Radiology CT CHEST ABDOMEN PELVIS WO CONTRAST Result Date: 03/08/2024 CLINICAL DATA:  Sepsis.  Abdominal pain.  Nausea and vomiting EXAM: CT CHEST, ABDOMEN AND PELVIS WITHOUT CONTRAST TECHNIQUE: Multidetector CT imaging of the chest, abdomen and pelvis was performed following the standard protocol without IV contrast. RADIATION DOSE REDUCTION: This exam was performed according to the departmental dose-optimization program which includes automated exposure control, adjustment of the mA and/or kV according to patient size and/or use of iterative reconstruction technique. COMPARISON:  Contrast enhanced 1CT 10/02/2017 FINDINGS: CT CHEST FINDINGS Cardiovascular: Accessed right chest port in place. The heart is upper normal in size. No pericardial effusion. Aortic atherosclerosis and tortuosity. Mediastinum/Nodes: No enlarged mediastinal lymph nodes. Hilar assessment is limited in the absence of IV contrast. No axillary adenopathy. Decompressed esophagus. Lungs/Pleura: Mild emphysema and diffuse bronchial thickening. Occasional mucoid impaction in the subsegmental lower lobes. No focal airspace disease. No pleural fluid. The trachea and central airways are clear. Musculoskeletal: Slight coarsened trabecula within T6 spinous process left mastectomy. Question of 14 mm inferior right breast nodule, series 2, image 44. CT ABDOMEN PELVIS FINDINGS Hepatobiliary: Diffuse hepatic steatosis. No evidence of focal liver lesion allowing for lack of contrast. There is more focal fatty infiltration adjacent to the gallbladder fossa. Clips in the gallbladder fossa postcholecystectomy. No biliary dilatation. Pancreas: No ductal dilatation or inflammation. Spleen: Normal in size without focal abnormality. Adrenals/Urinary Tract: No adrenal  nodule. No hydronephrosis. Mild symmetric perinephric edema. No renal calculi. Multiple bilateral low-density renal lesions are too small to characterize but likely cysts. No specific imaging follow-up is needed. Urinary bladder is near completely empty. Stomach/Bowel: The stomach is nondistended. Single prominent loop of fluid-filled small bowel in the left abdomen, series 2, image 81. No associated wall thickening. No obstruction. The appendix is not confidently visualized, there is no evidence of appendicitis. Small to moderate volume of formed stool in the colon. There is slight engorgement of the mesenteric vasculature and Vasa recta. Vascular/Lymphatic: Aortic atherosclerosis. No aneurysm. No enlarged lymph nodes in the abdomen or pelvis. Reproductive: Uterus and bilateral adnexa are unremarkable. Other: No ascites. No free air. No focal fluid collection. Small to moderate-sized fat containing umbilical hernia. No omental thickening. Musculoskeletal: Vertebroplasty within L1 compression fracture. Left hip arthroplasty. There are no acute or suspicious osseous abnormalities. IMPRESSION: 1. Single prominent loop of fluid-filled small bowel in the left abdomen, nonspecific. No associated wall thickening or obstruction. There is slight engorgement of the mesenteric vasculature and vasa recta, suspect nonspecific enteritis. 2. Slight trabecular thickening within the spinous process of T6, nonspecific. Consider bone scan or MRI  if there is clinical concern for metastasis. 3. Hepatic steatosis. 4. Question of 14 mm inferior right breast nodule. Follow-up per oncologic protocol in this patient with breast cancer. 5. Small to moderate-sized fat containing umbilical hernia. Aortic Atherosclerosis (ICD10-I70.0) and Emphysema (ICD10-J43.9). Electronically Signed   By: Chadwick Colonel M.D.   On: 03/08/2024 18:04    Procedures Procedures    Medications Ordered in ED Medications  sodium chloride  0.9 % bolus 1,000  mL (0 mLs Intravenous Stopped 03/08/24 1827)  prochlorperazine  (COMPAZINE ) injection 10 mg (10 mg Intravenous Given 03/08/24 1722)  metoCLOPramide (REGLAN) injection 10 mg (10 mg Intravenous Given 03/08/24 1724)  diphenhydrAMINE  (BENADRYL ) injection 25 mg (25 mg Intravenous Given 03/08/24 1719)  HYDROmorphone  (DILAUDID ) injection 1 mg (1 mg Intravenous Given 03/08/24 1721)    ED Course/ Medical Decision Making/ A&P                                 Medical Decision Making Rebecca Steele is a 73 y.o. female here presenting with abdominal pain and vomiting.  Patient has a history of breast cancer.  Consider worsening cancer versus small bowel obstruction versus gastroenteritis.  Plan to get CBC and CMP and CT chest abdomen pelvis.  7:21 PM I reviewed patient's labs and they were unremarkable.  In particular potassium went up after potassium supplementation.  CT abdomen pelvis showed nonspecific loop of bowel with no obvious obstruction.  Patient is able to tolerate p.o.  I think it is likely side effect of radiation versus viral gastroenteritis.  Patient is already on Compazine .  Will add Reglan as needed  Problems Addressed: Abdominal pain with vomiting: acute illness or injury  Amount and/or Complexity of Data Reviewed Labs: ordered. Radiology: ordered.  Risk Prescription drug management.   Final Clinical Impression(s) / ED Diagnoses Final diagnoses:  None    Rx / DC Orders ED Discharge Orders     None         Dalene Duck, MD 03/08/24 Larita Pluck

## 2024-03-08 NOTE — Progress Notes (Signed)
 Pt was transferred to ED per [facility]. Pt has had persistent vomiting and nausea. At time of transfer pt was receiving normal saline and potassium 10meq. Prior to transfer pt received pepcid  20mg , zofran  8mg  and phenergan  12.5mg . Pt verbalized understanding.

## 2024-03-09 ENCOUNTER — Inpatient Hospital Stay (HOSPITAL_BASED_OUTPATIENT_CLINIC_OR_DEPARTMENT_OTHER)
Admission: EM | Admit: 2024-03-09 | Discharge: 2024-03-14 | DRG: 392 | Disposition: A | Discharge status: Home / Self Care | Attending: Internal Medicine | Admitting: Internal Medicine

## 2024-03-09 ENCOUNTER — Other Ambulatory Visit: Payer: Self-pay

## 2024-03-09 ENCOUNTER — Encounter (HOSPITAL_BASED_OUTPATIENT_CLINIC_OR_DEPARTMENT_OTHER): Payer: Self-pay | Admitting: Emergency Medicine

## 2024-03-09 DIAGNOSIS — Z79899 Other long term (current) drug therapy: Secondary | ICD-10-CM

## 2024-03-09 DIAGNOSIS — F39 Unspecified mood [affective] disorder: Secondary | ICD-10-CM | POA: Diagnosis present

## 2024-03-09 DIAGNOSIS — G4733 Obstructive sleep apnea (adult) (pediatric): Secondary | ICD-10-CM | POA: Diagnosis present

## 2024-03-09 DIAGNOSIS — E039 Hypothyroidism, unspecified: Secondary | ICD-10-CM | POA: Diagnosis present

## 2024-03-09 DIAGNOSIS — Z9049 Acquired absence of other specified parts of digestive tract: Secondary | ICD-10-CM

## 2024-03-09 DIAGNOSIS — Z87442 Personal history of urinary calculi: Secondary | ICD-10-CM

## 2024-03-09 DIAGNOSIS — Z803 Family history of malignant neoplasm of breast: Secondary | ICD-10-CM

## 2024-03-09 DIAGNOSIS — I5032 Chronic diastolic (congestive) heart failure: Secondary | ICD-10-CM | POA: Diagnosis present

## 2024-03-09 DIAGNOSIS — K5900 Constipation, unspecified: Secondary | ICD-10-CM | POA: Diagnosis present

## 2024-03-09 DIAGNOSIS — Z888 Allergy status to other drugs, medicaments and biological substances status: Secondary | ICD-10-CM

## 2024-03-09 DIAGNOSIS — R112 Nausea with vomiting, unspecified: Principal | ICD-10-CM | POA: Diagnosis present

## 2024-03-09 DIAGNOSIS — Z6830 Body mass index (BMI) 30.0-30.9, adult: Secondary | ICD-10-CM

## 2024-03-09 DIAGNOSIS — Z79890 Hormone replacement therapy (postmenopausal): Secondary | ICD-10-CM

## 2024-03-09 DIAGNOSIS — T451X5A Adverse effect of antineoplastic and immunosuppressive drugs, initial encounter: Secondary | ICD-10-CM | POA: Diagnosis present

## 2024-03-09 DIAGNOSIS — E876 Hypokalemia: Secondary | ICD-10-CM | POA: Diagnosis present

## 2024-03-09 DIAGNOSIS — Z96642 Presence of left artificial hip joint: Secondary | ICD-10-CM | POA: Diagnosis present

## 2024-03-09 DIAGNOSIS — Z9012 Acquired absence of left breast and nipple: Secondary | ICD-10-CM

## 2024-03-09 DIAGNOSIS — C50912 Malignant neoplasm of unspecified site of left female breast: Secondary | ICD-10-CM | POA: Diagnosis present

## 2024-03-09 DIAGNOSIS — X58XXXA Exposure to other specified factors, initial encounter: Secondary | ICD-10-CM | POA: Diagnosis present

## 2024-03-09 DIAGNOSIS — Z1731 Human epidermal growth factor receptor 2 positive status: Secondary | ICD-10-CM

## 2024-03-09 DIAGNOSIS — E66811 Obesity, class 1: Secondary | ICD-10-CM | POA: Diagnosis present

## 2024-03-09 DIAGNOSIS — R000 Tachycardia, unspecified: Secondary | ICD-10-CM | POA: Diagnosis present

## 2024-03-09 DIAGNOSIS — R1110 Vomiting, unspecified: Principal | ICD-10-CM

## 2024-03-09 DIAGNOSIS — C779 Secondary and unspecified malignant neoplasm of lymph node, unspecified: Secondary | ICD-10-CM | POA: Diagnosis present

## 2024-03-09 DIAGNOSIS — E878 Other disorders of electrolyte and fluid balance, not elsewhere classified: Secondary | ICD-10-CM | POA: Diagnosis present

## 2024-03-09 DIAGNOSIS — I130 Hypertensive heart and chronic kidney disease with heart failure and stage 1 through stage 4 chronic kidney disease, or unspecified chronic kidney disease: Secondary | ICD-10-CM | POA: Diagnosis present

## 2024-03-09 DIAGNOSIS — Z881 Allergy status to other antibiotic agents status: Secondary | ICD-10-CM

## 2024-03-09 DIAGNOSIS — F32A Depression, unspecified: Secondary | ICD-10-CM | POA: Diagnosis present

## 2024-03-09 DIAGNOSIS — K760 Fatty (change of) liver, not elsewhere classified: Secondary | ICD-10-CM | POA: Diagnosis present

## 2024-03-09 DIAGNOSIS — N1831 Chronic kidney disease, stage 3a: Secondary | ICD-10-CM | POA: Diagnosis present

## 2024-03-09 DIAGNOSIS — K529 Noninfective gastroenteritis and colitis, unspecified: Secondary | ICD-10-CM | POA: Diagnosis present

## 2024-03-09 LAB — CBC WITH DIFFERENTIAL/PLATELET
Abs Immature Granulocytes: 0.05 10*3/uL (ref 0.00–0.07)
Basophils Absolute: 0.1 10*3/uL (ref 0.0–0.1)
Basophils Relative: 1 %
Eosinophils Absolute: 0 10*3/uL (ref 0.0–0.5)
Eosinophils Relative: 0 %
HCT: 46.7 % — ABNORMAL HIGH (ref 36.0–46.0)
Hemoglobin: 16.2 g/dL — ABNORMAL HIGH (ref 12.0–15.0)
Immature Granulocytes: 1 %
Lymphocytes Relative: 12 %
Lymphs Abs: 1.2 10*3/uL (ref 0.7–4.0)
MCH: 30.1 pg (ref 26.0–34.0)
MCHC: 34.7 g/dL (ref 30.0–36.0)
MCV: 86.8 fL (ref 80.0–100.0)
Monocytes Absolute: 1.7 10*3/uL — ABNORMAL HIGH (ref 0.1–1.0)
Monocytes Relative: 17 %
Neutro Abs: 7 10*3/uL (ref 1.7–7.7)
Neutrophils Relative %: 69 %
Platelets: 234 10*3/uL (ref 150–400)
RBC: 5.38 MIL/uL — ABNORMAL HIGH (ref 3.87–5.11)
RDW: 13.5 % (ref 11.5–15.5)
WBC: 10 10*3/uL (ref 4.0–10.5)
nRBC: 0 % (ref 0.0–0.2)

## 2024-03-09 LAB — COMPREHENSIVE METABOLIC PANEL WITH GFR
ALT: 26 U/L (ref 0–44)
AST: 45 U/L — ABNORMAL HIGH (ref 15–41)
Albumin: 4.6 g/dL (ref 3.5–5.0)
Alkaline Phosphatase: 123 U/L (ref 38–126)
Anion gap: 17 — ABNORMAL HIGH (ref 5–15)
BUN: 9 mg/dL (ref 8–23)
CO2: 23 mmol/L (ref 22–32)
Calcium: 10.7 mg/dL — ABNORMAL HIGH (ref 8.9–10.3)
Chloride: 98 mmol/L (ref 98–111)
Creatinine, Ser: 0.92 mg/dL (ref 0.44–1.00)
GFR, Estimated: >60 mL/min (ref >60)
Glucose, Bld: 115 mg/dL — ABNORMAL HIGH (ref 70–99)
Potassium: 3.4 mmol/L — ABNORMAL LOW (ref 3.5–5.1)
Sodium: 138 mmol/L (ref 135–145)
Total Bilirubin: 1.1 mg/dL (ref 0.0–1.2)
Total Protein: 7.6 g/dL (ref 6.5–8.1)

## 2024-03-09 LAB — URINALYSIS, ROUTINE W REFLEX MICROSCOPIC
Bilirubin Urine: NEGATIVE
Glucose, UA: NEGATIVE mg/dL
Ketones, ur: 15 mg/dL — AB
Nitrite: NEGATIVE
Protein, ur: NEGATIVE mg/dL
Specific Gravity, Urine: 1.015 (ref 1.005–1.030)
pH: 6.5 (ref 5.0–8.0)

## 2024-03-09 LAB — URINALYSIS, MICROSCOPIC (REFLEX)

## 2024-03-09 MED ORDER — ONDANSETRON HCL 4 MG/2ML IJ SOLN
4.0000 mg | Freq: Once | INTRAMUSCULAR | Status: AC
  Administered 2024-03-09: 4 mg via INTRAVENOUS
  Filled 2024-03-09: qty 2

## 2024-03-09 MED ORDER — HYDROMORPHONE HCL 1 MG/ML IJ SOLN
0.5000 mg | Freq: Once | INTRAMUSCULAR | Status: AC
  Administered 2024-03-09: 0.5 mg via INTRAVENOUS
  Filled 2024-03-09: qty 1

## 2024-03-09 MED ORDER — METOCLOPRAMIDE HCL 5 MG/ML IJ SOLN
10.0000 mg | Freq: Once | INTRAMUSCULAR | Status: AC
  Administered 2024-03-09: 10 mg via INTRAVENOUS
  Filled 2024-03-09: qty 2

## 2024-03-09 MED ORDER — SODIUM CHLORIDE 0.9 % IV BOLUS
1000.0000 mL | Freq: Once | INTRAVENOUS | Status: AC
  Administered 2024-03-09: 1000 mL via INTRAVENOUS

## 2024-03-09 NOTE — ED Triage Notes (Signed)
 Pt here for continued abd pain (general lower) and NV; was treated at [facility] yesterday post radiation tx; sts she cannot keep anything down

## 2024-03-09 NOTE — ED Provider Notes (Signed)
 [place] [facility] AT [facility] Provider Note   CSN: [id number] Arrival date & time: 03/09/24  1737     History  Chief Complaint  Patient presents with   Abdominal Pain    Rebecca Steele is a 73 y.o. female.  Patient currently being treated with chemo and daily radiation for cancer of the left breast. She has had progressively worsening abdominal pain for the past 4-5 days. She has been unable to eat anything, and is drinking very little. No fever. Seen in the ED after getting radiation yesterday for same symptoms, had reassuring work up, including CT chest/abd/pel, and was ultimately discharged home. She has compazine , reglan , zofran  at home but no pain medications.   The history is provided by the patient and a relative. No language interpreter was used.  Abdominal Pain      Home Medications Prior to Admission medications   Medication Sig Start Date End Date Taking? Authorizing Provider  butalbital -acetaminophen -caffeine  (FIORICET) 50-325-40 MG tablet Take 1 tablet by mouth 2 (two) times daily as needed for headache or migraine.    [provider]  levothyroxine  (SYNTHROID ) 25 MCG tablet Take 25 mcg by mouth daily before breakfast.    [provider]  lidocaine -prilocaine  (EMLA ) cream Apply to affected area once 01/16/24   Iruku, Praveena, MD  loratadine  (CLARITIN ) 10 MG tablet Take 10 mg by mouth daily.    [provider]  methocarbamol  (ROBAXIN ) 500 MG tablet Take 1 tablet (500 mg total) by mouth every 8 (eight) hours as needed for muscle spasms. 12/13/23   Enid Harry, MD  metoCLOPramide  (REGLAN ) 10 MG tablet Take 1 tablet (10 mg total) by mouth every 6 (six) hours as needed for nausea (nausea/headache). 03/08/24   Dalene Duck, MD  montelukast  (SINGULAIR ) 10 MG tablet Take 10 mg by mouth at bedtime.    [provider]  ondansetron  (ZOFRAN -ODT) 8 MG disintegrating tablet Take 1 tablet (8 mg total) by  mouth every 8 (eight) hours as needed for nausea or vomiting. 03/08/24   Johna Myers, MD  prochlorperazine  (COMPAZINE ) 10 MG tablet Take 1 tablet (10 mg total) by mouth every 6 (six) hours as needed for nausea or vomiting. 01/16/24   Iruku, Praveena, MD  sertraline  (ZOLOFT ) 50 MG tablet Take 50 mg by mouth daily.    [provider]  traZODone  (DESYREL ) 100 MG tablet Take 100 mg by mouth at bedtime.    [provider]      Allergies    Gadolinium derivatives, Other, Valium, Paxlovid [nirmatrelvir-ritonavir], Doxycycline , Fentanyl , Iohexol , Macrodantin, Red dye #40 (allura red), Rocephin [ceftriaxone], and Zithromax [azithromycin]    Review of Systems   Review of Systems  Gastrointestinal:  Positive for abdominal pain.    Physical Exam Updated Vital Signs BP (!) 186/87   Pulse 91   Temp 98.4 F (36.9 C) (Oral)   Resp 15   Ht 5\' 5"  (1.651 m)   Wt 83.5 kg   SpO2 94%   BMI 30.62 kg/m  Physical Exam Vitals and nursing note reviewed.  Constitutional:      Appearance: She is well-developed.  HENT:     Head: Normocephalic.  Cardiovascular:     Rate and Rhythm: Normal rate and regular rhythm.     Heart sounds: No murmur heard. Pulmonary:     Effort: Pulmonary effort is normal.     Breath sounds: Normal breath sounds. No wheezing, rhonchi or rales.  Abdominal:     General: Bowel  sounds are normal. There is no distension.     Palpations: Abdomen is soft.     Tenderness: There is no abdominal tenderness (Lower abdomen not reproducibly tender.). There is no guarding or rebound.  Musculoskeletal:        General: Normal range of motion.     Cervical back: Normal range of motion and neck supple.  Skin:    General: Skin is warm and dry.  Neurological:     General: No focal deficit present.     Mental Status: She is alert and oriented to person, place, and time.     ED Results / Procedures / Treatments   Labs (all labs ordered are listed, but only abnormal results  are displayed) Labs Reviewed  CBC WITH DIFFERENTIAL/PLATELET - Abnormal; Notable for the following components:      Result Value   RBC 5.38 (*)    Hemoglobin 16.2 (*)    HCT 46.7 (*)    Monocytes Absolute 1.7 (*)    All other components within normal limits  COMPREHENSIVE METABOLIC PANEL WITH GFR - Abnormal; Notable for the following components:   Potassium 3.4 (*)    Glucose, Bld 115 (*)    Calcium  10.7 (*)    AST 45 (*)    Anion gap 17 (*)    All other components within normal limits  URINALYSIS, ROUTINE W REFLEX MICROSCOPIC - Abnormal; Notable for the following components:   Hgb urine dipstick TRACE (*)    Ketones, ur 15 (*)    Leukocytes,Ua TRACE (*)    All other components within normal limits  URINALYSIS, MICROSCOPIC (REFLEX) - Abnormal; Notable for the following components:   Bacteria, UA RARE (*)    All other components within normal limits   Results for orders placed or performed during the hospital encounter of 03/09/24  CBC with Differential   Collection Time: 03/09/24  6:17 PM  Result Value Ref Range   WBC 10.0 4.0 - 10.5 K/uL   RBC 5.38 (H) 3.87 - 5.11 MIL/uL   Hemoglobin 16.2 (H) 12.0 - 15.0 g/dL   HCT 16.1 (H) 09.6 - 04.5 %   MCV 86.8 80.0 - 100.0 fL   MCH 30.1 26.0 - 34.0 pg   MCHC 34.7 30.0 - 36.0 g/dL   RDW 40.9 81.1 - 91.4 %   Platelets 234 150 - 400 K/uL   nRBC 0.0 0.0 - 0.2 %   Neutrophils Relative % 69 %   Neutro Abs 7.0 1.7 - 7.7 K/uL   Lymphocytes Relative 12 %   Lymphs Abs 1.2 0.7 - 4.0 K/uL   Monocytes Relative 17 %   Monocytes Absolute 1.7 (H) 0.1 - 1.0 K/uL   Eosinophils Relative 0 %   Eosinophils Absolute 0.0 0.0 - 0.5 K/uL   Basophils Relative 1 %   Basophils Absolute 0.1 0.0 - 0.1 K/uL   Immature Granulocytes 1 %   Abs Immature Granulocytes 0.05 0.00 - 0.07 K/uL  Comprehensive metabolic panel   Collection Time: 03/09/24  6:17 PM  Result Value Ref Range   Sodium 138 135 - 145 mmol/L   Potassium 3.4 (L) 3.5 - 5.1 mmol/L   Chloride  98 98 - 111 mmol/L   CO2 23 22 - 32 mmol/L   Glucose, Bld 115 (H) 70 - 99 mg/dL   BUN 9 8 - 23 mg/dL   Creatinine, Ser 7.82 0.44 - 1.00 mg/dL   Calcium  10.7 (H) 8.9 - 10.3 mg/dL   Total Protein 7.6 6.5 -  8.1 g/dL   Albumin  4.6 3.5 - 5.0 g/dL   AST 45 (H) 15 - 41 U/L   ALT 26 0 - 44 U/L   Alkaline Phosphatase 123 38 - 126 U/L   Total Bilirubin 1.1 0.0 - 1.2 mg/dL   GFR, Estimated >16 >10 mL/min   Anion gap 17 (H) 5 - 15  Urinalysis, Routine w reflex microscopic -Urine, Clean Catch   Collection Time: 03/09/24  6:17 PM  Result Value Ref Range   Color, Urine YELLOW YELLOW   APPearance CLEAR CLEAR   Specific Gravity, Urine 1.015 1.005 - 1.030   pH 6.5 5.0 - 8.0   Glucose, UA NEGATIVE NEGATIVE mg/dL   Hgb urine dipstick TRACE (A) NEGATIVE   Bilirubin Urine NEGATIVE NEGATIVE   Ketones, ur 15 (A) NEGATIVE mg/dL   Protein, ur NEGATIVE NEGATIVE mg/dL   Nitrite NEGATIVE NEGATIVE   Leukocytes,Ua TRACE (A) NEGATIVE  Urinalysis, Microscopic (reflex)   Collection Time: 03/09/24  6:17 PM  Result Value Ref Range   RBC / HPF 0-5 0 - 5 RBC/hpf   WBC, UA 0-5 0 - 5 WBC/hpf   Bacteria, UA RARE (A) NONE SEEN   Squamous Epithelial / HPF 0-5 0 - 5 /HPF    EKG None  Radiology CT CHEST ABDOMEN PELVIS WO CONTRAST Result Date: 03/08/2024 CLINICAL DATA:  Sepsis.  Abdominal pain.  Nausea and vomiting EXAM: CT CHEST, ABDOMEN AND PELVIS WITHOUT CONTRAST TECHNIQUE: Multidetector CT imaging of the chest, abdomen and pelvis was performed following the standard protocol without IV contrast. RADIATION DOSE REDUCTION: This exam was performed according to the departmental dose-optimization program which includes automated exposure control, adjustment of the mA and/or kV according to patient size and/or use of iterative reconstruction technique. COMPARISON:  Contrast enhanced 1CT 10/02/2017 FINDINGS: CT CHEST FINDINGS Cardiovascular: Accessed right chest port in place. The heart is upper normal in size. No  pericardial effusion. Aortic atherosclerosis and tortuosity. Mediastinum/Nodes: No enlarged mediastinal lymph nodes. Hilar assessment is limited in the absence of IV contrast. No axillary adenopathy. Decompressed esophagus. Lungs/Pleura: Mild emphysema and diffuse bronchial thickening. Occasional mucoid impaction in the subsegmental lower lobes. No focal airspace disease. No pleural fluid. The trachea and central airways are clear. Musculoskeletal: Slight coarsened trabecula within T6 spinous process left mastectomy. Question of 14 mm inferior right breast nodule, series 2, image 44. CT ABDOMEN PELVIS FINDINGS Hepatobiliary: Diffuse hepatic steatosis. No evidence of focal liver lesion allowing for lack of contrast. There is more focal fatty infiltration adjacent to the gallbladder fossa. Clips in the gallbladder fossa postcholecystectomy. No biliary dilatation. Pancreas: No ductal dilatation or inflammation. Spleen: Normal in size without focal abnormality. Adrenals/Urinary Tract: No adrenal nodule. No hydronephrosis. Mild symmetric perinephric edema. No renal calculi. Multiple bilateral low-density renal lesions are too small to characterize but likely cysts. No specific imaging follow-up is needed. Urinary bladder is near completely empty. Stomach/Bowel: The stomach is nondistended. Single prominent loop of fluid-filled small bowel in the left abdomen, series 2, image 81. No associated wall thickening. No obstruction. The appendix is not confidently visualized, there is no evidence of appendicitis. Small to moderate volume of formed stool in the colon. There is slight engorgement of the mesenteric vasculature and Vasa recta. Vascular/Lymphatic: Aortic atherosclerosis. No aneurysm. No enlarged lymph nodes in the abdomen or pelvis. Reproductive: Uterus and bilateral adnexa are unremarkable. Other: No ascites. No free air. No focal fluid collection. Small to moderate-sized fat containing umbilical hernia. No omental  thickening. Musculoskeletal: Vertebroplasty within L1  compression fracture. Left hip arthroplasty. There are no acute or suspicious osseous abnormalities. IMPRESSION: 1. Single prominent loop of fluid-filled small bowel in the left abdomen, nonspecific. No associated wall thickening or obstruction. There is slight engorgement of the mesenteric vasculature and vasa recta, suspect nonspecific enteritis. 2. Slight trabecular thickening within the spinous process of T6, nonspecific. Consider bone scan or MRI if there is clinical concern for metastasis. 3. Hepatic steatosis. 4. Question of 14 mm inferior right breast nodule. Follow-up per oncologic protocol in this patient with breast cancer. 5. Small to moderate-sized fat containing umbilical hernia. Aortic Atherosclerosis (ICD10-I70.0) and Emphysema (ICD10-J43.9). Electronically Signed   By: Chadwick Colonel M.D.   On: 03/08/2024 18:04    Procedures Procedures    Medications Ordered in ED Medications  sodium chloride  0.9 % bolus 1,000 mL (0 mLs Intravenous Stopped 03/09/24 2038)  HYDROmorphone  (DILAUDID ) injection 0.5 mg (0.5 mg Intravenous Given 03/09/24 1857)  ondansetron  (ZOFRAN ) injection 4 mg (4 mg Intravenous Given 03/09/24 1854)  sodium chloride  0.9 % bolus 1,000 mL (0 mLs Intravenous Stopped 03/09/24 2055)  HYDROmorphone  (DILAUDID ) injection 0.5 mg (0.5 mg Intravenous Given 03/09/24 2251)  metoCLOPramide  (REGLAN ) injection 10 mg (10 mg Intravenous Given 03/09/24 2323)    ED Course/ Medical Decision Making/ A&P                                 Medical Decision Making This patient presents to the ED for concern of lower abdominal pain, this involves an extensive number of treatment options, and is a complaint that carries with it a high risk of complications and morbidity.  The differential diagnosis includes UTI, SBO/LBO, colitis, diverticulitis, perforation.    Co morbidities that complicate the patient evaluation  Breast CA currently on chemo and  radiation, hypothyroidism, kidney stones   Additional history obtained:  Additional history and/or information obtained from chart review, notable for CT Chest/abd/pel from yesterday in ED:  IMPRESSION: 1. Single prominent loop of fluid-filled small bowel in the left abdomen, nonspecific. No associated wall thickening or obstruction. There is slight engorgement of the mesenteric vasculature and vasa recta, suspect nonspecific enteritis. 2. Slight trabecular thickening within the spinous process of T6, nonspecific. Consider bone scan or MRI if there is clinical concern for metastasis. 3. Hepatic steatosis. 4. Question of 14 mm inferior right breast nodule. Follow-up per oncologic protocol in this patient with breast cancer. 5. Small to moderate-sized fat containing umbilical hernia.    Lab Tests:  I Ordered, and personally interpreted labs.  The pertinent results include:   UA - sp gr 1.015, rare bacteria,  Cmet: glucose 115, K+ 3.4, AST 45, otherwise normal values CBC: WBC 10.0, Hgb 16.2, plts 234   Imaging Studies ordered:  I ordered imaging studies including not repeated I independently visualized and interpreted imaging which showed n/a I agree with the radiologist interpretation   Cardiac Monitoring:  The patient was maintained on a cardiac monitor.  I personally viewed and interpreted the cardiac monitored which showed an underlying rhythm of: n/a   Medicines ordered and prescription drug management:  I ordered medication including Dilaudid   for pain Reevaluation of the patient after these medicines showed that the patient improved I have reviewed the patients home medicines and have made adjustments as needed   Test Considered:  N/a   Critical Interventions:  N/a   Consultations Obtained:  I requested consultation with the n/a,  and discussed lab  and imaging findings as well as pertinent plan - they recommend: n/a   Problem List / ED  Course:  Here for 2nd ED visit in 2 days for severe lower abdominal pain. No fever, vomited x 3 today (improvement from yesterday) Labs reassuring CT chest/abd/pel from visit yesterday c/w enteritis Patient has required repeated dosing of Dilaudid  - not taking anything at home for pain If pain controlled and can tolerate PO, can discharge to home  PO challenge: Persistent vomiting after multiple doses of nausea and pain medications. VSS. No evidence new infection. Will admit for intractable vomiting and abdominal pain.   Reevaluation:  After the interventions noted above, I reevaluated the patient and found that they have :worsened   Social Determinants of Health:  Never a smoker   Disposition:  After consideration of the diagnostic results and the patients response to treatment, I feel that the patient would benefit from admit.   Amount and/or Complexity of Data Reviewed Labs: ordered.  Risk Prescription drug management.  n/a         Final Clinical Impression(s) / ED Diagnoses Final diagnoses:  Intractable vomiting  Enteritis    Rx / DC Orders ED Discharge Orders     None         Mandy Second, PA-C 03/09/24 2357

## 2024-03-10 ENCOUNTER — Encounter (HOSPITAL_BASED_OUTPATIENT_CLINIC_OR_DEPARTMENT_OTHER): Payer: Self-pay | Admitting: Internal Medicine

## 2024-03-10 ENCOUNTER — Observation Stay (HOSPITAL_COMMUNITY)

## 2024-03-10 DIAGNOSIS — Z171 Estrogen receptor negative status [ER-]: Secondary | ICD-10-CM

## 2024-03-10 DIAGNOSIS — T451X5A Adverse effect of antineoplastic and immunosuppressive drugs, initial encounter: Secondary | ICD-10-CM | POA: Diagnosis present

## 2024-03-10 DIAGNOSIS — Z881 Allergy status to other antibiotic agents status: Secondary | ICD-10-CM | POA: Diagnosis not present

## 2024-03-10 DIAGNOSIS — E039 Hypothyroidism, unspecified: Secondary | ICD-10-CM | POA: Diagnosis present

## 2024-03-10 DIAGNOSIS — Z96642 Presence of left artificial hip joint: Secondary | ICD-10-CM | POA: Diagnosis present

## 2024-03-10 DIAGNOSIS — C50912 Malignant neoplasm of unspecified site of left female breast: Secondary | ICD-10-CM | POA: Diagnosis not present

## 2024-03-10 DIAGNOSIS — I5032 Chronic diastolic (congestive) heart failure: Secondary | ICD-10-CM | POA: Diagnosis present

## 2024-03-10 DIAGNOSIS — G4733 Obstructive sleep apnea (adult) (pediatric): Secondary | ICD-10-CM | POA: Diagnosis present

## 2024-03-10 DIAGNOSIS — Z6830 Body mass index (BMI) 30.0-30.9, adult: Secondary | ICD-10-CM | POA: Diagnosis not present

## 2024-03-10 DIAGNOSIS — R112 Nausea with vomiting, unspecified: Secondary | ICD-10-CM | POA: Diagnosis present

## 2024-03-10 DIAGNOSIS — N1831 Chronic kidney disease, stage 3a: Secondary | ICD-10-CM | POA: Diagnosis present

## 2024-03-10 DIAGNOSIS — F32A Depression, unspecified: Secondary | ICD-10-CM | POA: Diagnosis present

## 2024-03-10 DIAGNOSIS — I130 Hypertensive heart and chronic kidney disease with heart failure and stage 1 through stage 4 chronic kidney disease, or unspecified chronic kidney disease: Secondary | ICD-10-CM | POA: Diagnosis present

## 2024-03-10 DIAGNOSIS — Z79890 Hormone replacement therapy (postmenopausal): Secondary | ICD-10-CM | POA: Diagnosis not present

## 2024-03-10 DIAGNOSIS — E876 Hypokalemia: Secondary | ICD-10-CM | POA: Diagnosis present

## 2024-03-10 DIAGNOSIS — K529 Noninfective gastroenteritis and colitis, unspecified: Secondary | ICD-10-CM | POA: Diagnosis present

## 2024-03-10 DIAGNOSIS — Z888 Allergy status to other drugs, medicaments and biological substances status: Secondary | ICD-10-CM | POA: Diagnosis not present

## 2024-03-10 DIAGNOSIS — Z79899 Other long term (current) drug therapy: Secondary | ICD-10-CM | POA: Diagnosis not present

## 2024-03-10 DIAGNOSIS — Z1731 Human epidermal growth factor receptor 2 positive status: Secondary | ICD-10-CM | POA: Diagnosis not present

## 2024-03-10 DIAGNOSIS — C779 Secondary and unspecified malignant neoplasm of lymph node, unspecified: Secondary | ICD-10-CM | POA: Diagnosis present

## 2024-03-10 DIAGNOSIS — X58XXXA Exposure to other specified factors, initial encounter: Secondary | ICD-10-CM | POA: Diagnosis present

## 2024-03-10 DIAGNOSIS — K5900 Constipation, unspecified: Secondary | ICD-10-CM | POA: Diagnosis present

## 2024-03-10 DIAGNOSIS — Z87442 Personal history of urinary calculi: Secondary | ICD-10-CM | POA: Diagnosis not present

## 2024-03-10 DIAGNOSIS — Z9012 Acquired absence of left breast and nipple: Secondary | ICD-10-CM | POA: Diagnosis not present

## 2024-03-10 DIAGNOSIS — F39 Unspecified mood [affective] disorder: Secondary | ICD-10-CM | POA: Diagnosis present

## 2024-03-10 DIAGNOSIS — K760 Fatty (change of) liver, not elsewhere classified: Secondary | ICD-10-CM | POA: Diagnosis present

## 2024-03-10 DIAGNOSIS — E66811 Obesity, class 1: Secondary | ICD-10-CM | POA: Diagnosis present

## 2024-03-10 HISTORY — DX: Nausea with vomiting, unspecified: R11.2

## 2024-03-10 LAB — CBC
HCT: 42.8 % (ref 36.0–46.0)
Hemoglobin: 14.3 g/dL (ref 12.0–15.0)
MCH: 30.5 pg (ref 26.0–34.0)
MCHC: 33.4 g/dL (ref 30.0–36.0)
MCV: 91.3 fL (ref 80.0–100.0)
Platelets: 178 10*3/uL (ref 150–400)
RBC: 4.69 MIL/uL (ref 3.87–5.11)
RDW: 13.9 % (ref 11.5–15.5)
WBC: 7.8 10*3/uL (ref 4.0–10.5)
nRBC: 0 % (ref 0.0–0.2)

## 2024-03-10 LAB — BASIC METABOLIC PANEL WITH GFR
Anion gap: 9 (ref 5–15)
BUN: 11 mg/dL (ref 8–23)
CO2: 25 mmol/L (ref 22–32)
Calcium: 9.3 mg/dL (ref 8.9–10.3)
Chloride: 102 mmol/L (ref 98–111)
Creatinine, Ser: 0.83 mg/dL (ref 0.44–1.00)
GFR, Estimated: >60 mL/min (ref >60)
Glucose, Bld: 119 mg/dL — ABNORMAL HIGH (ref 70–99)
Potassium: 3.1 mmol/L — ABNORMAL LOW (ref 3.5–5.1)
Sodium: 136 mmol/L (ref 135–145)

## 2024-03-10 LAB — MAGNESIUM: Magnesium: 2 mg/dL (ref 1.7–2.4)

## 2024-03-10 LAB — LACTIC ACID, PLASMA: Lactic Acid, Venous: 1 mmol/L (ref 0.5–1.9)

## 2024-03-10 LAB — PHOSPHORUS: Phosphorus: 3.3 mg/dL (ref 2.5–4.6)

## 2024-03-10 LAB — BETA-HYDROXYBUTYRIC ACID: Beta-Hydroxybutyric Acid: 0.78 mmol/L — ABNORMAL HIGH (ref 0.05–0.27)

## 2024-03-10 MED ORDER — ACETAMINOPHEN 500 MG PO TABS
1000.0000 mg | ORAL_TABLET | Freq: Four times a day (QID) | ORAL | Status: DC | PRN
  Administered 2024-03-11 – 2024-03-12 (×2): 1000 mg via ORAL
  Filled 2024-03-10 (×2): qty 2

## 2024-03-10 MED ORDER — POTASSIUM CHLORIDE 10 MEQ/100ML IV SOLN: 10.0000 meq | INTRAVENOUS | Status: DC

## 2024-03-10 MED ORDER — LEVOTHYROXINE SODIUM 75 MCG PO TABS
75.0000 ug | ORAL_TABLET | Freq: Every day | ORAL | Status: DC
  Administered 2024-03-10 – 2024-03-14 (×5): 75 ug via ORAL
  Filled 2024-03-10 (×5): qty 1

## 2024-03-10 MED ORDER — POTASSIUM CHLORIDE CRYS ER 20 MEQ PO TBCR
40.0000 meq | EXTENDED_RELEASE_TABLET | Freq: Two times a day (BID) | ORAL | Status: AC
  Administered 2024-03-10 – 2024-03-11 (×3): 40 meq via ORAL
  Filled 2024-03-10 (×4): qty 2

## 2024-03-10 MED ORDER — TRAZODONE HCL 50 MG PO TABS
100.0000 mg | ORAL_TABLET | Freq: Every day | ORAL | Status: DC
  Administered 2024-03-10 – 2024-03-13 (×4): 100 mg via ORAL
  Filled 2024-03-10 (×4): qty 2

## 2024-03-10 MED ORDER — HYDROMORPHONE HCL 1 MG/ML IJ SOLN
0.5000 mg | INTRAMUSCULAR | Status: DC | PRN
  Administered 2024-03-10 – 2024-03-11 (×3): 0.5 mg via INTRAVENOUS
  Filled 2024-03-10 (×3): qty 0.5

## 2024-03-10 MED ORDER — OXYCODONE HCL 5 MG PO TABS
2.5000 mg | ORAL_TABLET | ORAL | Status: DC | PRN
  Administered 2024-03-10 – 2024-03-12 (×2): 2.5 mg via ORAL
  Filled 2024-03-10 (×2): qty 1

## 2024-03-10 MED ORDER — OXYCODONE HCL 5 MG PO TABS
5.0000 mg | ORAL_TABLET | ORAL | Status: DC | PRN
  Administered 2024-03-11: 5 mg via ORAL
  Filled 2024-03-10: qty 1

## 2024-03-10 MED ORDER — SENNA 8.6 MG PO TABS
2.0000 | ORAL_TABLET | Freq: Every day | ORAL | Status: DC
  Administered 2024-03-10 – 2024-03-11 (×2): 17.2 mg via ORAL
  Filled 2024-03-10 (×4): qty 2

## 2024-03-10 MED ORDER — ALUM & MAG HYDROXIDE-SIMETH 200-200-20 MG/5ML PO SUSP
30.0000 mL | ORAL | Status: DC | PRN
  Administered 2024-03-11: 30 mL via ORAL
  Filled 2024-03-10: qty 30

## 2024-03-10 MED ORDER — POTASSIUM CHLORIDE 10 MEQ/100ML IV SOLN
10.0000 meq | INTRAVENOUS | Status: AC
  Administered 2024-03-10 (×4): 10 meq via INTRAVENOUS
  Filled 2024-03-10 (×4): qty 100

## 2024-03-10 MED ORDER — DEXTROSE-SODIUM CHLORIDE 5-0.9 % IV SOLN: INTRAVENOUS | Status: AC

## 2024-03-10 MED ORDER — BISACODYL 10 MG RE SUPP: 10.0000 mg | Freq: Every day | RECTAL | Status: DC | PRN

## 2024-03-10 MED ORDER — MONTELUKAST SODIUM 10 MG PO TABS
10.0000 mg | ORAL_TABLET | Freq: Every day | ORAL | Status: DC
  Administered 2024-03-10 – 2024-03-13 (×4): 10 mg via ORAL
  Filled 2024-03-10 (×4): qty 1

## 2024-03-10 MED ORDER — POLYETHYLENE GLYCOL 3350 17 G PO PACK
17.0000 g | PACK | Freq: Two times a day (BID) | ORAL | Status: DC
  Administered 2024-03-10 – 2024-03-11 (×4): 17 g via ORAL
  Filled 2024-03-10 (×4): qty 1

## 2024-03-10 MED ORDER — SODIUM CHLORIDE 0.9 % IV SOLN
8.0000 mg | Freq: Three times a day (TID) | INTRAVENOUS | Status: DC | PRN
  Administered 2024-03-10 (×2): 8 mg via INTRAVENOUS
  Filled 2024-03-10 (×4): qty 4

## 2024-03-10 MED ORDER — ALBUTEROL SULFATE (2.5 MG/3ML) 0.083% IN NEBU: 2.5000 mg | INHALATION_SOLUTION | RESPIRATORY_TRACT | Status: DC | PRN

## 2024-03-10 MED ORDER — ENOXAPARIN SODIUM 40 MG/0.4ML IJ SOSY
40.0000 mg | PREFILLED_SYRINGE | INTRAMUSCULAR | Status: DC
  Administered 2024-03-10 – 2024-03-14 (×5): 40 mg via SUBCUTANEOUS
  Filled 2024-03-10 (×5): qty 0.4

## 2024-03-10 MED ORDER — PROCHLORPERAZINE EDISYLATE 10 MG/2ML IJ SOLN
10.0000 mg | Freq: Four times a day (QID) | INTRAMUSCULAR | Status: DC | PRN
  Administered 2024-03-10 – 2024-03-11 (×4): 10 mg via INTRAVENOUS
  Filled 2024-03-10 (×4): qty 2

## 2024-03-10 MED ORDER — SERTRALINE HCL 50 MG PO TABS
50.0000 mg | ORAL_TABLET | Freq: Every day | ORAL | Status: DC
  Administered 2024-03-10 – 2024-03-14 (×5): 50 mg via ORAL
  Filled 2024-03-10 (×5): qty 1

## 2024-03-10 MED ORDER — SODIUM CHLORIDE 0.9% FLUSH
3.0000 mL | Freq: Two times a day (BID) | INTRAVENOUS | Status: DC
  Administered 2024-03-10 – 2024-03-14 (×7): 3 mL via INTRAVENOUS

## 2024-03-10 MED ORDER — MELATONIN 3 MG PO TABS: 6.0000 mg | ORAL_TABLET | Freq: Every evening | ORAL | Status: DC | PRN

## 2024-03-10 NOTE — ED Notes (Signed)
 [facility] called for transport.

## 2024-03-10 NOTE — Progress Notes (Signed)
 Mobility Specialist - Progress Note   03/10/24 1121  Oxygen Therapy  O2 Device Nasal Cannula  O2 Flow Rate (L/min) 2 L/min  Mobility  Activity Ambulated independently in hallway  Level of Assistance Independent  Assistive Device None  Distance Ambulated (ft) 400 ft  Activity Response Tolerated well  Mobility Referral Yes  Mobility visit 1 Mobility  Mobility Specialist Start Time (ACUTE ONLY) 1107  Mobility Specialist Stop Time (ACUTE ONLY) 1120  Mobility Specialist Time Calculation (min) (ACUTE ONLY) 13 min   Pt received in bed and agreeable to mobility. Unable to check SpO2 during session. Pt took x1 standing rest break d/t SOB. Pt to bed after session with all needs met.    [facility]

## 2024-03-10 NOTE — H&P (Signed)
 History and Physical    Rebecca Steele WGN:[id number] DOB: [birth year] DOA: 03/09/2024  PCP: Louis Row, MD   Patient coming from:[place] from [facility] ED    Chief Complaint:  Chief Complaint  Patient presents with   Abdominal Pain    HPI:  Rebecca Steele is a 73 y.o. female with hx of pathologic stage IIa breast cancer, HER2 positive, s/p left mastectomy, currently on Kadcyla  chemo, undergoing radiation, additional history and hypertension, CKD 3A, HFpEF, MVP, OSA not on CPAP, mood disorder, who is transferred from [facility] ED with intractable nausea and vomiting.  Reports that she last had chemotherapy on 5/27.  On 6/2-3 developed lower abdominal burning which has been persistent along with intractable nausea and vomiting.  Intolerance of p.o. since this time.  Emesis described as yellow/bilious, without blood.  Has been constipated for the past 3 to 4 days.  Reports sensitivity to smell.  Has Zofran  and Compazine  at home but has no relief with use and vomiting afterwards, has not tried ODT tablets at home yet.  Associated chills.  No fevers.  Has not had similar episodes with prior cycles of chemo.  Was seen in the ED on 6/6 treated with IV fluids and antiemetics, CT abdomen pelvis with nonspecific fluid-filled dilated small bowel loop and associated vascular engorgement, suggestive of enteritis, With no other acute findings and ultimately discharged.  Returned to [facility] ED with persistent symptoms and transferred to [facility] for further care   Review of Systems:  ROS complete and negative except as marked above   Allergies  Allergen Reactions   Gadolinium Derivatives Anaphylaxis and Shortness Of Breath    Pt was given 17ml multihance  for MRI. After about 84min30 seconds she squeezed the ball saying she felt like her throat was closing up. Exam was DC'd and radiologist gave 75mg  benadryl  IV.    Other     Pt states she was given Gabapentin  and Fentanyl  for anesthesia during recent surgery and was  unable to wake up for several hours.  Pt states she does not want to have sedation with those medications in the future.   Valium Anaphylaxis   Paxlovid [Nirmatrelvir-Ritonavir] Hives   Doxycycline  Swelling    Facial swelling Pt has since had without problem   Fentanyl  Other (See Comments)    Slept the whole time for few days   Iohexol  Itching     Desc: Pt. stated she had iv contrast around 25 yrs ago and had itching on her neck.  she took benadryl  and it went away.  The rad recommended premeds and be done tomorrow but the pt.did not want to do that so we did her w/o iv contrast today., Onset Date: [id number]    Macrodantin Nausea And Vomiting   Red Dye #40 (Allura Red) Swelling    Facial swelling (cannot take pink or red tablets or capsules)   Rocephin [Ceftriaxone] Other (See Comments)    Joint aches   Zithromax [Azithromycin] Other (See Comments)    Weakness, cold/clammy, legs trembling    Prior to Admission medications   Medication Sig Start Date End Date Taking? Authorizing Provider  butalbital -acetaminophen -caffeine  (FIORICET) 50-325-40 MG tablet Take 1 tablet by mouth 2 (two) times daily as needed for headache or migraine.    [provider]  levothyroxine  (SYNTHROID ) 25 MCG tablet Take 25 mcg by mouth daily before breakfast.    [provider]  lidocaine -prilocaine  (EMLA ) cream Apply to affected area once 01/16/24   Iruku, Praveena, MD  loratadine  (CLARITIN ) 10 MG tablet Take 10 mg by mouth daily.    [provider]  methocarbamol  (ROBAXIN ) 500 MG tablet Take 1 tablet (500 mg total) by mouth every 8 (eight) hours as needed for muscle spasms. 12/13/23   Enid Harry, MD  metoCLOPramide  (REGLAN ) 10 MG tablet Take 1 tablet (10 mg total) by mouth every 6 (six) hours as needed for nausea (nausea/headache). 03/08/24   Dalene Duck, MD  montelukast  (SINGULAIR ) 10 MG tablet Take 10 mg by mouth at bedtime.    [provider]  ondansetron   (ZOFRAN -ODT) 8 MG disintegrating tablet Take 1 tablet (8 mg total) by mouth every 8 (eight) hours as needed for nausea or vomiting. 03/08/24   Johna Myers, MD  prochlorperazine  (COMPAZINE ) 10 MG tablet Take 1 tablet (10 mg total) by mouth every 6 (six) hours as needed for nausea or vomiting. 01/16/24   Iruku, Praveena, MD  sertraline  (ZOLOFT ) 50 MG tablet Take 50 mg by mouth daily.    [provider]  traZODone  (DESYREL ) 100 MG tablet Take 100 mg by mouth at bedtime.    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 states she was given gabapentin  and fentanyl  with breast surgery and was diff to wake up   Depression    History of kidney stones    Hypothyroidism    Mitral valve prolapse    Nausea & vomiting 03/10/2024   Sleep apnea    HAS MILD OSA, NO CPAP NEEDED    Past Surgical History:  Procedure Laterality Date   APPENDECTOMY     BREAST BIOPSY Right 09/16/2022   MM RT BREAST BX W LOC DEV 1ST LESION IMAGE BX SPEC STEREO GUIDE 09/16/2022 [facility]   BREAST BIOPSY  12/06/2022   MM RT RADIOACTIVE SEED LOC MAMMO GUIDE 12/06/2022 [facility]   BREAST BIOPSY Left 11/16/2023   US  LT RADIOACTIVE SEED LOC 11/16/2023 [facility]   BREAST BIOPSY  11/16/2023   MM LT RADIOACTIVE SEED EA ADD LESION LOC MAMMO GUIDE 11/16/2023 [facility]   BREAST EXCISIONAL BIOPSY Right 2018   BREAST LUMPECTOMY WITH RADIOACTIVE SEED AND SENTINEL LYMPH NODE BIOPSY Left 11/20/2023   Procedure: LEFT BREAST SEED GUIDED LUMPECTOMY, LEFT AXILLARY SENTINEL NODE BIOPSY;  Surgeon: Enid Harry, MD;  Location: [facility];  Service: General;  Laterality: Left;  PEC BLOCK   CHOLECYSTECTOMY     CYSTOSCOPY W/ RETROGRADES     KYPHOPLASTY N/A 10/05/2021   Procedure:  LUMBAR ONE KYPHOPLASTY;  Surgeon: Cannon Champion, MD;  Location: [facility];  Service: Neurosurgery;  Laterality: N/A;   NECK SURGERY     PORTACATH PLACEMENT Right 10/12/2023   Procedure: INSERTION PORT-A-CATH WITH GUIDED ULTRASOUND;  Surgeon: Enid Harry, MD;  Location: [facility];  Service: General;  Laterality: Right;   RADIOACTIVE SEED GUIDED AXILLARY SENTINEL LYMPH NODE Left 11/20/2023   Procedure: LEFT AXILLARY NODE SEED GUIDED EXCISION;  Surgeon: Enid Harry, MD;  Location: [facility];  Service: General;  Laterality: Left;   RADIOACTIVE SEED GUIDED EXCISIONAL BREAST BIOPSY Right 12/12/2017   Procedure: RIGHT RADIOACTIVE SEED GUIDED EXCISIONAL BREAST BIOPSY ERAS PATHWAY;  Surgeon: Enid Harry, MD;  Location: [facility];  Service:  General;  Laterality: Right;  LMA   RADIOACTIVE SEED GUIDED EXCISIONAL BREAST BIOPSY Right 12/07/2022   Procedure: RADIOACTIVE SEED GUIDED EXCISIONAL RIGHT BREAST BIOPSY;  Surgeon: Enid Harry, MD;  Location: [facility];  Service: General;  Laterality: Right;   SHOULDER SURGERY     SIMPLE MASTECTOMY WITH AXILLARY SENTINEL NODE BIOPSY Left 12/12/2023   Procedure: LEFT  MASTECTOMY;  Surgeon: Enid Harry, MD;  Location: [facility];  Service: General;  Laterality: Left;   TONSILLECTOMY     TOTAL HIP ARTHROPLASTY Left 06/04/2021   Procedure: TOTAL HIP ARTHROPLASTY ANTERIOR APPROACH;  Surgeon: Neil Balls, MD;  Location: [facility];  Service: Orthopedics;  Laterality: Left;   TRIGGER FINGER RELEASE Right 05/18/2015   Procedure: RIGHT  LONG FINGER TRIGGER RELEASE ;  Surgeon: Rober Chimera, MD;  Location: [facility];  Service: Orthopedics;  Laterality: Right;     reports that she has never smoked. She has never used smokeless tobacco. She reports that she does not drink alcohol  and does not use drugs.  Family History  Problem Relation Age of Onset    Breast cancer Maternal Aunt        60s     Physical Exam: Vitals:   03/10/24 0100 03/10/24 0130 03/10/24 0149 03/10/24 0239  BP: (!) 176/82 (!) 176/89  (!) 175/88  Pulse: 78 79  85  Resp: 15 18  14   Temp:   98.1 F (36.7 C) 98.7 F (37.1 C)  TempSrc:   Oral Oral  SpO2: 97% 98%  93%  Weight:    83.2 kg  Height:        Gen: Awake, alert, chronically ill-appearing CV: Regular, normal S1, S2, 1/6 SEM.  Port right chest Resp: Normal WOB, CTAB  Abd: Round, nondistended, hypoactive, moderate tenderness in the right mid abdomen, no rebound, guarding, rigidity MSK: Symmetric, no edema  Skin: No rashes or lesions to exposed skin  Neuro: Alert and interactive  Psych: euthymic, appropriate    Data review:   Labs reviewed, notable for:   K3.4, bicarb 23, anion gap 17 Hemoglobin 16 up from prior UA positive ketone   Micro:  Results for orders placed or performed during the hospital encounter of 01/21/24  Culture, blood (single)     Status: None   Collection Time: 01/21/24  6:26 PM   Specimen: BLOOD LEFT FOREARM  Result Value Ref Range Status   Specimen Description   Final    BLOOD LEFT FOREARM Performed at [facility], [address] [place], [place] [address]    Special Requests   Final    BOTTLES DRAWN AEROBIC AND ANAEROBIC Blood Culture adequate volume Performed at [facility], [address] [place], [place] [address]    Culture   Final    NO GROWTH 5 DAYS Performed at [facility], [address], [place], [place] [address]    Report Status 01/26/2024 FINAL  Final    Imaging reviewed:  CT CHEST ABDOMEN PELVIS WO CONTRAST Result Date: 03/08/2024 CLINICAL DATA:  Sepsis.  Abdominal pain.  Nausea and vomiting EXAM: CT CHEST, ABDOMEN AND PELVIS WITHOUT CONTRAST TECHNIQUE: Multidetector CT imaging of the chest, abdomen and pelvis was performed following the standard protocol without IV contrast. RADIATION DOSE REDUCTION: This exam was  performed according to the departmental dose-optimization program which includes automated exposure control, adjustment of the mA and/or kV according to patient size and/or use of iterative reconstruction technique. COMPARISON:  Contrast enhanced 1CT 10/02/2017 FINDINGS: CT CHEST FINDINGS Cardiovascular: Accessed right chest port in place. The heart is upper normal in size. No pericardial effusion. Aortic atherosclerosis and tortuosity. Mediastinum/Nodes: No enlarged mediastinal lymph nodes. Hilar assessment is limited in the absence of IV contrast. No axillary adenopathy. Decompressed esophagus. Lungs/Pleura: Mild emphysema and diffuse bronchial thickening. Occasional mucoid impaction in the subsegmental lower lobes. No focal airspace disease. No pleural fluid. The trachea and central airways are clear. Musculoskeletal: Slight coarsened trabecula within T6 spinous process left mastectomy. Question of 14 mm inferior right breast nodule, series 2, image 44. CT ABDOMEN PELVIS FINDINGS Hepatobiliary: Diffuse hepatic steatosis. No evidence of focal liver lesion allowing for lack of contrast. There is more focal fatty infiltration adjacent to the gallbladder fossa. Clips in the gallbladder fossa postcholecystectomy. No biliary dilatation. Pancreas: No ductal dilatation or inflammation. Spleen: Normal in size without focal abnormality. Adrenals/Urinary Tract: No adrenal nodule. No hydronephrosis. Mild symmetric perinephric edema. No renal calculi. Multiple bilateral low-density renal lesions are too small to characterize but likely cysts. No specific imaging follow-up is needed. Urinary bladder is near completely empty. Stomach/Bowel: The stomach is nondistended. Single prominent loop of fluid-filled small bowel in the left abdomen, series 2, image 81. No associated wall thickening. No obstruction. The appendix is not confidently visualized, there is no evidence of appendicitis. Small to moderate volume of formed stool in  the colon. There is slight engorgement of the mesenteric vasculature and Vasa recta. Vascular/Lymphatic: Aortic atherosclerosis. No aneurysm. No enlarged lymph nodes in the abdomen or pelvis. Reproductive: Uterus and bilateral adnexa are unremarkable. Other: No ascites. No free air. No focal fluid collection. Small to moderate-sized fat containing umbilical hernia. No omental thickening. Musculoskeletal: Vertebroplasty within L1 compression fracture. Left hip arthroplasty. There are no acute or suspicious osseous abnormalities. IMPRESSION: 1. Single prominent loop of fluid-filled small bowel in the left abdomen, nonspecific. No associated wall thickening or obstruction. There is slight engorgement of the mesenteric vasculature and vasa recta, suspect nonspecific enteritis. 2. Slight trabecular thickening within the spinous process of T6, nonspecific. Consider bone scan or MRI if there is clinical concern for metastasis. 3. Hepatic steatosis. 4. Question of 14 mm inferior right breast nodule. Follow-up per oncologic protocol in this patient with breast cancer. 5. Small to moderate-sized fat containing umbilical hernia. Aortic Atherosclerosis (ICD10-I70.0) and Emphysema (ICD10-J43.9). Electronically Signed   By: Chadwick Colonel M.D.   On: 03/08/2024 18:04   Personally reviewed KUB Nonobstructive bowel gas pattern.   Outside ED Course:  Treated with 2 L IV fluid, Zofran , Reglan , Dilaudid .  Transferred from [facility] ED for further care   Assessment/Plan:  73 y.o. female with hx pathologic stage IIa breast cancer, HER2 positive, s/p left mastectomy, currently on Kadcyla  chemo, undergoing radiation, additional history and hypertension, CKD 3A, HFpEF, MVP, OSA not on CPAP, mood disorder, who is transferred from [facility] ED with intractable nausea and vomiting.  Intractable nausea and vomiting, suspect chemo adverse effect  Suspect starvation ketosis Onset ~ 6/2 intolerance of PO and N/V, constipation. Last chemo  5/27. Also undergoing daily XRT. Initial tachycardia in low 100s resolved. Abd exam without peritoneal signs. Labs with notable K 3.4, bicarb 23, but AG of 17 with UA + Ketones, likely hemoconcentration with rise in Hb. Overall suggesting dehydration and likely starvation ketosis likely. CT A/P yesterday with nonspecific fluid filled SB loop in L mid abd without signs of obstruction, vascular engorgement, findings suggestive of enteritis. KUB today on my review with nonobstructive bowel  gas pattern. Etiology of symptoms likely chemotherapy related with timing of onset and frequency of GI AE related to Kadcyla . Other consideration would be viral enteritis.  - S/p 2 L IV fluid at outside ED.  Continue D5 NS at 75 cc an hour - Clear liquid diet, advance as tolerates - Symptomatic management Zofran /Compazine  as needed for nausea.  Bowel regimen.  Pain control with oxycodone  2.5/5 mg q4hr as needed for moderate/severe, Dilaudid  0.5 mg IV as needed for breakthrough. - Consider sending with additional antiemetic ODT options or suppository for home use.  Hypokalemia Repleted  Incidental findings: 1.4 cm right breast nodule T6 trabecular thickening, malignancy not excluded Hepatic steatosis  Chronic medical problems: pathologic stage IIa breast cancer, HER2 positive:  s/p left mastectomy, currently on Kadcyla  chemo, undergoing radiation.  Follows with Dr. Arno Bibles.  Hypertension: Currently uncontrolled suspect secondary to pain, nausea, management of these issues.  If remains hypertensive consider starting on antihypertensive medication.  Not on medication at time of admission CKD II: Baseline creatinine around 0.8-9 HFpEF: Last TTE 4/'25 LVEF 55-60%, Gr 1 diastolic dysfunction. without acute exacerbation, not on diuretics. MVP: Noted, not seen on last echo  OSA not on CPAP mood disorder: Resume home sertraline   Body mass index is 30.52 kg/m.  Obesity class I  DVT prophylaxis:  Lovenox  Code Status:   Full Code Diet:  Diet Orders (From admission, onward)     Start     Ordered   03/10/24 0348  Diet clear liquid Room service appropriate? Yes; Fluid consistency: Thin  Diet effective now       Question Answer Comment  Room service appropriate? Yes   Fluid consistency: Thin      03/10/24 0351           Family Communication: Yes discussed with daughter at bedside Consults: None Admission status:   Observation, Med-Surg  Severity of Illness: The appropriate patient status for this patient is OBSERVATION. Observation status is judged to be reasonable and necessary in order to provide the required intensity of service to ensure the patient's safety. The patient's presenting symptoms, physical exam findings, and initial radiographic and laboratory data in the context of their medical condition is felt to place them at decreased risk for further clinical deterioration. Furthermore, it is anticipated that the patient will be medically stable for discharge from the hospital within 2 midnights of admission.    Arnulfo Larch, MD [facility]  How to contact the [facility] Attending or Consulting provider 7A - 7P or covering provider during after hours 7P -7A, for this patient.  Check the care team in [facility] and look for a) attending/consulting [facility] provider listed and b) the [facility] team listed Log into [email or web address] and use [facility] [place]'s universal password to access. If you do not have the password, please contact the hospital operator. Locate the [facility] provider you are looking for under [facility] and page to a number that you can be directly reached. If you still have difficulty reaching the provider, please page the [facility] (Director on Call) for the Hospitalists listed on amion for assistance.  03/10/2024, 4:57 AM

## 2024-03-10 NOTE — Plan of Care (Signed)

## 2024-03-10 NOTE — Progress Notes (Signed)
 Patient seen and examined.  Admitted early morning hours by nighttime hospitalist.  See history physical exam assessment plan for details.  In brief, 73 year old with breast cancer status post left mastectomy currently on chemotherapy, additional history of hypertension, CKD, sleep apnea admitted with intractable nausea vomiting that started about 4 days ago.  Last chemotherapy was 5/27.  Intolerance to oral intake.  Intractable nausea vomiting due to chemotherapy: Multiple electrolyte abnormalities.  Patient still with persistent symptoms.  Continue clear liquid diet.  Continue maintenance IV fluids.  Adequate nausea medications. Replete electrolytes IV and oral.  Recheck levels tomorrow morning. Gradual advancement of diet. Need to stay in the hospital as inpatient until patient can tolerate reasonable oral intake.   Total time spent: 35 minutes.  Same-day admit.  No charge visit.

## 2024-03-10 NOTE — ED Notes (Signed)
 6E was notified that [facility] was transporting pt to [facility] at this time.

## 2024-03-11 ENCOUNTER — Ambulatory Visit
Admission: RE | Admit: 2024-03-11 | Discharge: 2024-03-11 | Disposition: A | Discharge status: Home / Self Care | Source: Ambulatory Visit | Attending: Radiation Oncology

## 2024-03-11 ENCOUNTER — Inpatient Hospital Stay

## 2024-03-11 ENCOUNTER — Other Ambulatory Visit: Payer: Self-pay

## 2024-03-11 ENCOUNTER — Ambulatory Visit

## 2024-03-11 ENCOUNTER — Inpatient Hospital Stay: Admitting: Physician Assistant

## 2024-03-11 DIAGNOSIS — T451X5A Adverse effect of antineoplastic and immunosuppressive drugs, initial encounter: Secondary | ICD-10-CM | POA: Diagnosis not present

## 2024-03-11 DIAGNOSIS — R112 Nausea with vomiting, unspecified: Secondary | ICD-10-CM | POA: Diagnosis not present

## 2024-03-11 LAB — CBC WITH DIFFERENTIAL/PLATELET
Abs Immature Granulocytes: 0.02 10*3/uL (ref 0.00–0.07)
Basophils Absolute: 0.1 10*3/uL (ref 0.0–0.1)
Basophils Relative: 1 %
Eosinophils Absolute: 0.1 10*3/uL (ref 0.0–0.5)
Eosinophils Relative: 2 %
HCT: 43.2 % (ref 36.0–46.0)
Hemoglobin: 14.3 g/dL (ref 12.0–15.0)
Immature Granulocytes: 0 %
Lymphocytes Relative: 12 %
Lymphs Abs: 0.7 10*3/uL (ref 0.7–4.0)
MCH: 30.4 pg (ref 26.0–34.0)
MCHC: 33.1 g/dL (ref 30.0–36.0)
MCV: 91.7 fL (ref 80.0–100.0)
Monocytes Absolute: 1.1 10*3/uL — ABNORMAL HIGH (ref 0.1–1.0)
Monocytes Relative: 19 %
Neutro Abs: 3.8 10*3/uL (ref 1.7–7.7)
Neutrophils Relative %: 66 %
Platelets: 167 10*3/uL (ref 150–400)
RBC: 4.71 MIL/uL (ref 3.87–5.11)
RDW: 13.6 % (ref 11.5–15.5)
WBC: 5.7 10*3/uL (ref 4.0–10.5)
nRBC: 0 % (ref 0.0–0.2)

## 2024-03-11 LAB — RAD ONC ARIA SESSION SUMMARY
Course Elapsed Days: 21
Plan Fractions Treated to Date: 14
Plan Fractions Treated to Date: 7
Plan Prescribed Dose Per Fraction: 1.8 Gy
Plan Prescribed Dose Per Fraction: 1.8 Gy
Plan Total Fractions Prescribed: 14
Plan Total Fractions Prescribed: 28
Plan Total Prescribed Dose: 25.2 Gy
Plan Total Prescribed Dose: 50.4 Gy
Reference Point Dosage Given to Date: 12.6 Gy
Reference Point Dosage Given to Date: 25.2 Gy
Reference Point Session Dosage Given: 1.8 Gy
Reference Point Session Dosage Given: 1.8 Gy
Session Number: 14

## 2024-03-11 LAB — COMPREHENSIVE METABOLIC PANEL WITH GFR
ALT: 23 U/L (ref 0–44)
AST: 35 U/L (ref 15–41)
Albumin: 3.5 g/dL (ref 3.5–5.0)
Alkaline Phosphatase: 92 U/L (ref 38–126)
Anion gap: 8 (ref 5–15)
BUN: 7 mg/dL — ABNORMAL LOW (ref 8–23)
CO2: 26 mmol/L (ref 22–32)
Calcium: 9.2 mg/dL (ref 8.9–10.3)
Chloride: 100 mmol/L (ref 98–111)
Creatinine, Ser: 0.82 mg/dL (ref 0.44–1.00)
GFR, Estimated: >60 mL/min (ref >60)
Glucose, Bld: 133 mg/dL — ABNORMAL HIGH (ref 70–99)
Potassium: 3 mmol/L — ABNORMAL LOW (ref 3.5–5.1)
Sodium: 134 mmol/L — ABNORMAL LOW (ref 135–145)
Total Bilirubin: 1 mg/dL (ref 0.0–1.2)
Total Protein: 6.7 g/dL (ref 6.5–8.1)

## 2024-03-11 LAB — MAGNESIUM: Magnesium: 2 mg/dL (ref 1.7–2.4)

## 2024-03-11 LAB — PHOSPHORUS: Phosphorus: 2.9 mg/dL (ref 2.5–4.6)

## 2024-03-11 MED ORDER — SODIUM CHLORIDE 0.9% FLUSH
10.0000 mL | Freq: Two times a day (BID) | INTRAVENOUS | Status: DC
  Administered 2024-03-11 – 2024-03-14 (×5): 10 mL

## 2024-03-11 MED ORDER — DEXTROSE-SODIUM CHLORIDE 5-0.9 % IV SOLN: INTRAVENOUS | Status: AC

## 2024-03-11 MED ORDER — CHLORHEXIDINE GLUCONATE CLOTH 2 % EX PADS
6.0000 | MEDICATED_PAD | Freq: Every day | CUTANEOUS | Status: DC
  Administered 2024-03-12 – 2024-03-14 (×3): 6 via TOPICAL

## 2024-03-11 MED ORDER — LORATADINE 10 MG PO TABS
10.0000 mg | ORAL_TABLET | Freq: Every day | ORAL | Status: DC
  Administered 2024-03-11 – 2024-03-13 (×3): 10 mg via ORAL
  Filled 2024-03-11 (×3): qty 1

## 2024-03-11 MED ORDER — ONDANSETRON HCL 4 MG/2ML IJ SOLN
4.0000 mg | Freq: Three times a day (TID) | INTRAMUSCULAR | Status: DC | PRN
  Administered 2024-03-11: 4 mg via INTRAVENOUS
  Filled 2024-03-11: qty 2

## 2024-03-11 MED ORDER — FAMOTIDINE 20 MG PO TABS: 20.0000 mg | ORAL_TABLET | Freq: Two times a day (BID) | ORAL | Status: DC

## 2024-03-11 MED ORDER — SODIUM CHLORIDE 0.9% FLUSH: 10.0000 mL | INTRAVENOUS | Status: DC | PRN

## 2024-03-11 MED ORDER — ENSURE PLUS HIGH PROTEIN PO LIQD
237.0000 mL | Freq: Two times a day (BID) | ORAL | Status: DC
  Administered 2024-03-12 – 2024-03-13 (×2): 237 mL via ORAL

## 2024-03-11 MED ORDER — PANTOPRAZOLE SODIUM 40 MG PO TBEC
40.0000 mg | DELAYED_RELEASE_TABLET | Freq: Every day | ORAL | Status: DC
  Administered 2024-03-11 – 2024-03-14 (×4): 40 mg via ORAL
  Filled 2024-03-11 (×4): qty 1

## 2024-03-11 NOTE — Progress Notes (Signed)
 PROGRESS NOTE    Rebecca Steele  WJX:[id number] DOB: 11/21/1950 DOA: 03/09/2024 PCP: Louis Row, MD    Brief Narrative:  73 year old with breast cancer status post left mastectomy currently on chemotherapy, additional history of hypertension, CKD, sleep apnea admitted with intractable nausea vomiting that started about 4 days ago. Last chemotherapy was 5/27. Intolerance to oral intake.  Patient hemodynamically stable in the ER.  Admitted due to significant symptoms.  Subjective: Patient seen and examined.  Husband at the bedside.  She has occasional epigastric discomfort and reflux-like symptoms.  She was able to take liquid diet.  Occasional nausea but she wants to try to take more fluid and soup today. Assessment & Plan:   Intractable nausea vomiting due to chemotherapy: Multiple electrolyte abnormalities.  Patient still with persistent symptoms.  Challenge with full liquid diet today.  Continue maintenance IV fluids.  Adequate nausea medications.  Will add Protonix 40 mg daily. Adequate pain medications. Laxative regimen.  Hypokalemia: Replace.  Magnesium and phosphorus adequate.  Chronic medical issues Breast cancer stage IIa, currently on chemotherapy and undergoing daily radiation.  Continue. Essential hypertension: Blood pressures stable today.  Not on home medications.  Not treating occasional discomfort associated hypertension. CKD stage II: At about baseline. Chronic diastolic heart failure: Without evidence of any fluid overload.  Monitor on gentle IV fluids. Mood disorder: Continue sertraline .   DVT prophylaxis: enoxaparin  (LOVENOX ) injection 40 mg Start: 03/10/24 1000   Code Status: Full code Family Communication: Husband at the bedside Disposition Plan: Status is: Inpatient Remains inpatient appropriate because: Persistent pain and discomfort     Consultants:  None  Procedures:  Radiation therapy  Antimicrobials:  None     Objective: Vitals:    03/10/24 1106 03/10/24 1418 03/10/24 2058 03/11/24 0436  BP: (!) 183/87 (!) 156/82 (!) 162/79 (!) 152/77  Pulse: 80 84 83 77  Resp: 16 18 14 14   Temp:  (!) 97.5 F (36.4 C) 98.7 F (37.1 C) 98.9 F (37.2 C)  TempSrc:  Oral Oral Oral  SpO2: 98% 97% 92% 91%  Weight:      Height:        Intake/Output Summary (Last 24 hours) at 03/11/2024 1337 Last data filed at 03/11/2024 0322 Gross per 24 hour  Intake 1224.93 ml  Output --  Net 1224.93 ml   Filed Weights   03/09/24 1756 03/10/24 0239  Weight: 83.5 kg 83.2 kg    Examination:  General exam: Appears calm and comfortable, slightly anxious Respiratory system: Clear to auscultation. Respiratory effort normal. Cardiovascular system: S1 & S2 heard, RRR. No JVD, murmurs, rubs, gallops or clicks. No pedal edema. Gastrointestinal system: Soft.  Mild tenderness on deep palpation along the upper abdomen.  Bowel sound present.  No rigidity or guarding. Central nervous system: Alert and oriented. No focal neurological deficits. Extremities: Symmetric 5 x 5 power. Skin: No rashes, lesions or ulcers Psychiatry: Judgement and insight appear normal. Mood & affect appropriate.     Data Reviewed: I have personally reviewed following labs and imaging studies  CBC: Recent Labs  Lab 03/08/24 1432 03/08/24 1709 03/09/24 1817 03/10/24 0500 03/11/24 0602  WBC 7.4 7.3 10.0 7.8 5.7  NEUTROABS 5.2 5.5 7.0  --  3.8  HGB 16.2* 15.3* 16.2* 14.3 14.3  HCT 45.5 44.9 46.7* 42.8 43.2  MCV 86.2 90.2 86.8 91.3 91.7  PLT 199 171 234 178 167   Basic Metabolic Panel: Recent Labs  Lab 03/08/24 1432 03/08/24 1709 03/09/24 1817 03/10/24 0500 03/11/24  0602  NA 138 136 138 136 134*  K 3.3* 3.6 3.4* 3.1* 3.0*  CL 101 103 98 102 100  CO2 26 23 23 25 26   GLUCOSE 118* 120* 115* 119* 133*  BUN 10 10 9 11  7*  CREATININE 0.83 0.86 0.92 0.83 0.82  CALCIUM  10.4* 9.3 10.7* 9.3 9.2  MG 1.9  --   --  2.0 2.0  PHOS  --   --   --  3.3 2.9    GFR: Estimated Creatinine Clearance: 66.1 mL/min (by C-G formula based on SCr of 0.82 mg/dL). Liver Function Tests: Recent Labs  Lab 03/08/24 1432 03/08/24 1709 03/09/24 1817 03/11/24 0602  AST 34 35 45* 35  ALT 22 23 26 23   ALKPHOS 113 95 123 92  BILITOT 1.4* 1.3* 1.1 1.0  PROT 7.6 6.9 7.6 6.7  ALBUMIN  4.6 3.7 4.6 3.5   Recent Labs  Lab 03/08/24 1709  LIPASE 35   No results for input(s): "AMMONIA " in the last 168 hours. Coagulation Profile: No results for input(s): "INR", "PROTIME" in the last 168 hours. Cardiac Enzymes: No results for input(s): "CKTOTAL", "CKMB", "CKMBINDEX", "TROPONINI" in the last 168 hours. BNP (last 3 results) No results for input(s): "PROBNP" in the last 8760 hours. HbA1C: No results for input(s): "HGBA1C" in the last 72 hours. CBG: No results for input(s): "GLUCAP" in the last 168 hours. Lipid Profile: No results for input(s): "CHOL", "HDL", "LDLCALC", "TRIG", "CHOLHDL", "LDLDIRECT" in the last 72 hours. Thyroid  Function Tests: No results for input(s): "TSH", "T4TOTAL", "FREET4", "T3FREE", "THYROIDAB" in the last 72 hours. Anemia Panel: No results for input(s): "VITAMINB12", "FOLATE", "FERRITIN", "TIBC", "IRON", "RETICCTPCT" in the last 72 hours. Sepsis Labs: Recent Labs  Lab 03/10/24 0500  LATICACIDVEN 1.0    No results found for this or any previous visit (from the past 240 hours).       Radiology Studies: DG Abd 1 View Result Date: 03/10/2024 CLINICAL DATA:  Intractable nausea and vomiting. EXAM: ABDOMEN - 1 VIEW COMPARISON:  CT 03/08/2024 FINDINGS: The bowel gas pattern is nonspecific. There is no dilated loops of small or large bowel identified. Surgical clips noted in the right upper quadrant of the abdomen. No acute osseous findings. Signs vertebroplasty noted at the L1 level. Previous left hip arthroplasty. Sclerotic lesion within the intertrochanteric portions of the proximal right femur noted. This is compatible with a benign  bone island. IMPRESSION: Nonspecific bowel gas pattern.  No evidence for bowel obstruction. Electronically Signed   By: Kimberley Penman M.D.   On: 03/10/2024 06:11        Scheduled Meds:  enoxaparin  (LOVENOX ) injection  40 mg Subcutaneous Q24H   levothyroxine   75 mcg Oral Q0600   montelukast   10 mg Oral QHS   pantoprazole  40 mg Oral Daily   polyethylene glycol  17 g Oral BID   potassium chloride   40 mEq Oral BID   senna  2 tablet Oral QHS   sertraline   50 mg Oral Daily   sodium chloride  flush  3 mL Intravenous Q12H   traZODone   100 mg Oral QHS   Continuous Infusions:  dextrose  5 % and 0.9 % NaCl     ondansetron  (ZOFRAN ) IV Stopped (03/10/24 2037)     LOS: 1 day    Time spent: 52 minutes    Vada Garibaldi, MD [facility]

## 2024-03-11 NOTE — Plan of Care (Signed)
  Problem: Education: Goal: Knowledge of General Education information will improve Description: Including pain rating scale, medication(s)/side effects and non-pharmacologic comfort measures Outcome: Progressing   Problem: Clinical Measurements: Goal: Will remain free from infection Outcome: Progressing   Problem: Nutrition: Goal: Adequate nutrition will be maintained Outcome: Progressing   Problem: Pain Managment: Goal: General experience of comfort will improve and/or be controlled Outcome: Progressing   Problem: Safety: Goal: Ability to remain free from injury will improve Outcome: Progressing

## 2024-03-12 ENCOUNTER — Ambulatory Visit

## 2024-03-12 ENCOUNTER — Ambulatory Visit
Admission: RE | Admit: 2024-03-12 | Discharge: 2024-03-12 | Disposition: A | Discharge status: Home / Self Care | Source: Ambulatory Visit | Attending: Radiation Oncology | Admitting: Radiation Oncology

## 2024-03-12 ENCOUNTER — Other Ambulatory Visit: Payer: Self-pay

## 2024-03-12 DIAGNOSIS — T451X5A Adverse effect of antineoplastic and immunosuppressive drugs, initial encounter: Secondary | ICD-10-CM | POA: Diagnosis not present

## 2024-03-12 DIAGNOSIS — R112 Nausea with vomiting, unspecified: Secondary | ICD-10-CM | POA: Diagnosis not present

## 2024-03-12 LAB — RAD ONC ARIA SESSION SUMMARY
Course Elapsed Days: 22
Plan Fractions Treated to Date: 15
Plan Fractions Treated to Date: 8
Plan Prescribed Dose Per Fraction: 1.8 Gy
Plan Prescribed Dose Per Fraction: 1.8 Gy
Plan Total Fractions Prescribed: 14
Plan Total Fractions Prescribed: 28
Plan Total Prescribed Dose: 25.2 Gy
Plan Total Prescribed Dose: 50.4 Gy
Reference Point Dosage Given to Date: 14.4 Gy
Reference Point Dosage Given to Date: 27 Gy
Reference Point Session Dosage Given: 1.8 Gy
Reference Point Session Dosage Given: 1.8 Gy
Session Number: 15

## 2024-03-12 MED ORDER — LACTULOSE 10 GM/15ML PO SOLN
20.0000 g | Freq: Two times a day (BID) | ORAL | Status: DC
  Administered 2024-03-12 – 2024-03-13 (×2): 20 g via ORAL
  Filled 2024-03-12 (×5): qty 30

## 2024-03-12 MED ORDER — SUCRALFATE 1 GM/10ML PO SUSP
1.0000 g | Freq: Three times a day (TID) | ORAL | Status: DC
  Administered 2024-03-12 – 2024-03-14 (×5): 1 g via ORAL
  Filled 2024-03-12 (×5): qty 10

## 2024-03-12 MED ORDER — BISACODYL 10 MG RE SUPP
10.0000 mg | Freq: Once | RECTAL | Status: AC
  Administered 2024-03-12: 10 mg via RECTAL
  Filled 2024-03-12: qty 1

## 2024-03-12 NOTE — Plan of Care (Signed)
  Problem: Clinical Measurements: Goal: Respiratory complications will improve Outcome: Progressing Goal: Cardiovascular complication will be avoided Outcome: Progressing   Problem: Coping: Goal: Level of anxiety will decrease Outcome: Progressing   Problem: Elimination: Goal: Will not experience complications related to urinary retention Outcome: Progressing   Problem: Pain Managment: Goal: General experience of comfort will improve and/or be controlled Outcome: Progressing

## 2024-03-12 NOTE — Progress Notes (Signed)
 PROGRESS NOTE    LEMMIE STEINHAUS  ZOX:[id number] DOB: [birth year] DOA: 03/09/2024 PCP: Louis Row, MD    Brief Narrative:  73 year old with breast cancer status post left mastectomy currently on chemotherapy, additional history of hypertension, CKD, sleep apnea admitted with intractable nausea vomiting that started about 4 days ago. Last chemotherapy was 5/27. Intolerance to oral intake.  Patient hemodynamically stable in the ER.  Admitted due to significant symptoms.  Subjective: Patient seen and examined.  No bowel movements yet.  Continues to have epigastric discomfort sometimes goes to retrosternal area.  Doing liquids okay.  Wants to see if she can do soft diet.  Assessment & Plan:   Intractable nausea vomiting due to chemotherapy: Multiple electrolyte abnormalities.  Patient still with significant symptoms.  Will advance to soft diet so she can choose that is easily digestible.  Continue maintenance IV fluids.  Adequate nausea medications.   Added Protonix 40 mg daily. Add sucralfate 1 g 4 times daily.  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 Adequate pain medications. Laxative regimen.  Hypokalemia: Replaced.  Magnesium and phosphorus adequate.  Chronic medical issues Breast cancer stage IIa, currently on chemotherapy and undergoing daily radiation.  Continue. Essential hypertension: Blood pressures stable today.  Not on home medications.  Not treating occasional discomfort associated hypertension. CKD stage II: At about baseline. Chronic diastolic heart failure: Without evidence of any fluid overload.  Monitor on gentle IV fluids. Mood disorder: Continue sertraline .   DVT prophylaxis: enoxaparin  (LOVENOX ) injection 40 mg Start: 03/10/24 1000   Code Status: Full code Family Communication: None today. Disposition Plan: Status is: Inpatient Remains inpatient appropriate because: Persistent pain and discomfort.  Intolerance to diet.     Consultants:   None  Procedures:  Radiation therapy  Antimicrobials:  None     Objective: Vitals:   03/11/24 0436 03/11/24 1341 03/11/24 2056 03/12/24 0602  BP: (!) 152/77 (!) 167/78 (!) 177/80 (!) 165/78  Pulse: 77 81 85 82  Resp: 14 16 16 16   Temp: 98.9 F (37.2 C) 98.6 F (37 C) 99.1 F (37.3 C) 98.7 F (37.1 C)  TempSrc: Oral Oral Oral Oral  SpO2: 91% 92% 93% 94%  Weight:      Height:        Intake/Output Summary (Last 24 hours) at 03/12/2024 1400 Last data filed at 03/12/2024 0900 Gross per 24 hour  Intake 1399.2 ml  Output 100 ml  Net 1299.2 ml   Filed Weights   03/09/24 1756 03/10/24 0239  Weight: 83.5 kg 83.2 kg    Examination:  General exam: Appears slightly anxious.  Not in any distress.  On room air. Respiratory system: Clear to auscultation. Respiratory effort normal. Cardiovascular system: S1 & S2 heard, RRR. No JVD, murmurs, rubs, gallops or clicks. No pedal edema. Gastrointestinal system: Soft.  Mild tenderness on deep palpation along the upper abdomen.  Bowel sound present.  No rigidity or guarding. Central nervous system: Alert and oriented. No focal neurological deficits. Extremities: Symmetric 5 x 5 power. Skin: No rashes, lesions or ulcers Psychiatry: Judgement and insight appear normal. Mood & affect appropriate.     Data Reviewed: I have personally reviewed following labs and imaging studies  CBC: Recent Labs  Lab 03/08/24 1432 03/08/24 1709 03/09/24 1817 03/10/24 0500 03/11/24 0602  WBC 7.4 7.3 10.0 7.8 5.7  NEUTROABS 5.2 5.5 7.0  --  3.8  HGB 16.2* 15.3* 16.2* 14.3 14.3  HCT 45.5 44.9 46.7* 42.8 43.2  MCV 86.2 90.2 86.8 91.3 91.7  PLT 199 171 234 178 167   Basic Metabolic Panel: Recent Labs  Lab 03/08/24 1432 03/08/24 1709 03/09/24 1817 03/10/24 0500 03/11/24 0602  NA 138 136 138 136 134*  K 3.3* 3.6 3.4* 3.1* 3.0*  CL 101 103 98 102 100  CO2 26 23 23 25 26   GLUCOSE 118* 120* 115* 119* 133*  BUN 10 10 9 11  7*  CREATININE  0.83 0.86 0.92 0.83 0.82  CALCIUM  10.4* 9.3 10.7* 9.3 9.2  MG 1.9  --   --  2.0 2.0  PHOS  --   --   --  3.3 2.9   GFR: Estimated Creatinine Clearance: 66.1 mL/min (by C-G formula based on SCr of 0.82 mg/dL). Liver Function Tests: Recent Labs  Lab 03/08/24 1432 03/08/24 1709 03/09/24 1817 03/11/24 0602  AST 34 35 45* 35  ALT 22 23 26 23   ALKPHOS 113 95 123 92  BILITOT 1.4* 1.3* 1.1 1.0  PROT 7.6 6.9 7.6 6.7  ALBUMIN  4.6 3.7 4.6 3.5   Recent Labs  Lab 03/08/24 1709  LIPASE 35   No results for input(s): "AMMONIA " in the last 168 hours. Coagulation Profile: No results for input(s): "INR", "PROTIME" in the last 168 hours. Cardiac Enzymes: No results for input(s): "CKTOTAL", "CKMB", "CKMBINDEX", "TROPONINI" in the last 168 hours. BNP (last 3 results) No results for input(s): "PROBNP" in the last 8760 hours. HbA1C: No results for input(s): "HGBA1C" in the last 72 hours. CBG: No results for input(s): "GLUCAP" in the last 168 hours. Lipid Profile: No results for input(s): "CHOL", "HDL", "LDLCALC", "TRIG", "CHOLHDL", "LDLDIRECT" in the last 72 hours. Thyroid  Function Tests: No results for input(s): "TSH", "T4TOTAL", "FREET4", "T3FREE", "THYROIDAB" in the last 72 hours. Anemia Panel: No results for input(s): "VITAMINB12", "FOLATE", "FERRITIN", "TIBC", "IRON", "RETICCTPCT" in the last 72 hours. Sepsis Labs: Recent Labs  Lab 03/10/24 0500  LATICACIDVEN 1.0    No results found for this or any previous visit (from the past 240 hours).       Radiology Studies: No results found.       Scheduled Meds:  Chlorhexidine  Gluconate Cloth  6 each Topical Daily   enoxaparin  (LOVENOX ) injection  40 mg Subcutaneous Q24H   feeding supplement  237 mL Oral BID BM   lactulose  20 g Oral BID   levothyroxine   75 mcg Oral Q0600   loratadine   10 mg Oral Daily   montelukast   10 mg Oral QHS   pantoprazole  40 mg Oral Daily   senna  2 tablet Oral QHS   sertraline   50 mg Oral Daily    sodium chloride  flush  10-40 mL Intracatheter Q12H   sodium chloride  flush  3 mL Intravenous Q12H   sucralfate  1 g Oral TID WC & HS   traZODone   100 mg Oral QHS   Continuous Infusions:  dextrose  5 % and 0.9 % NaCl 75 mL/hr at 03/12/24 0407     LOS: 2 days    Time spent: 52 minutes    Vada Garibaldi, MD [facility]

## 2024-03-12 NOTE — Progress Notes (Signed)
   03/12/24 1421  TOC Brief Assessment  Insurance and Status Reviewed  Patient has primary care physician Yes Elvin Hammer, Alric Jensen, MD)  Home environment has been reviewed yes from home  Prior level of function: Independent  Prior/Current Home Services No current home services  Social Drivers of Health Review SDOH reviewed no interventions necessary  Readmission risk has been reviewed Yes  Transition of care needs no transition of care needs at this time

## 2024-03-12 NOTE — Progress Notes (Signed)
 Mobility Specialist - Progress Note   03/12/24 0939  Mobility  Activity Ambulated independently in hallway  Level of Assistance Independent  Assistive Device None  Distance Ambulated (ft) 480 ft  Activity Response Tolerated well  Mobility Referral Yes  Mobility visit 1 Mobility  Mobility Specialist Start Time (ACUTE ONLY) [id number]  Mobility Specialist Stop Time (ACUTE ONLY) [id number]  Mobility Specialist Time Calculation (min) (ACUTE ONLY) 10 min   Pt received in bed and agreeable to mobility. No complaints during session. Pt to bed after session with all needs met.    [facility]

## 2024-03-13 ENCOUNTER — Ambulatory Visit

## 2024-03-13 ENCOUNTER — Ambulatory Visit
Admission: RE | Admit: 2024-03-13 | Discharge: 2024-03-13 | Disposition: A | Discharge status: Home / Self Care | Source: Ambulatory Visit | Attending: Radiation Oncology | Admitting: Radiation Oncology

## 2024-03-13 ENCOUNTER — Other Ambulatory Visit: Payer: Self-pay

## 2024-03-13 LAB — RAD ONC ARIA SESSION SUMMARY
Course Elapsed Days: 23
Plan Fractions Treated to Date: 16
Plan Fractions Treated to Date: 8
Plan Prescribed Dose Per Fraction: 1.8 Gy
Plan Prescribed Dose Per Fraction: 1.8 Gy
Plan Total Fractions Prescribed: 14
Plan Total Fractions Prescribed: 28
Plan Total Prescribed Dose: 25.2 Gy
Plan Total Prescribed Dose: 50.4 Gy
Reference Point Dosage Given to Date: 14.4 Gy
Reference Point Dosage Given to Date: 28.8 Gy
Reference Point Session Dosage Given: 1.8 Gy
Reference Point Session Dosage Given: 1.8 Gy
Session Number: 16

## 2024-03-13 LAB — BASIC METABOLIC PANEL WITH GFR
Anion gap: 9 (ref 5–15)
BUN: 9 mg/dL (ref 8–23)
CO2: 25 mmol/L (ref 22–32)
Calcium: 9.7 mg/dL (ref 8.9–10.3)
Chloride: 100 mmol/L (ref 98–111)
Creatinine, Ser: 0.86 mg/dL (ref 0.44–1.00)
GFR, Estimated: >60 mL/min (ref >60)
Glucose, Bld: 107 mg/dL — ABNORMAL HIGH (ref 70–99)
Potassium: 3.1 mmol/L — ABNORMAL LOW (ref 3.5–5.1)
Sodium: 134 mmol/L — ABNORMAL LOW (ref 135–145)

## 2024-03-13 MED ORDER — POTASSIUM CHLORIDE 10 MEQ/100ML IV SOLN
10.0000 meq | INTRAVENOUS | Status: AC
  Administered 2024-03-13 (×4): 10 meq via INTRAVENOUS
  Filled 2024-03-13 (×3): qty 100

## 2024-03-13 MED ORDER — POTASSIUM CHLORIDE CRYS ER 20 MEQ PO TBCR
40.0000 meq | EXTENDED_RELEASE_TABLET | Freq: Once | ORAL | Status: DC
  Filled 2024-03-13: qty 2

## 2024-03-13 MED ORDER — POTASSIUM CHLORIDE 20 MEQ PO PACK: 40.0000 meq | PACK | Freq: Two times a day (BID) | ORAL | Status: DC

## 2024-03-13 NOTE — Plan of Care (Signed)
  Problem: Clinical Measurements: Goal: Will remain free from infection Outcome: Progressing   Problem: Elimination: Goal: Will not experience complications related to bowel motility Outcome: Progressing Goal: Will not experience complications related to urinary retention Outcome: Progressing   Problem: Pain Managment: Goal: General experience of comfort will improve and/or be controlled Outcome: Progressing   Problem: Safety: Goal: Ability to remain free from injury will improve Outcome: Progressing

## 2024-03-13 NOTE — Progress Notes (Signed)
 PROGRESS NOTE  Rebecca Steele ZOX:[id number] DOB: 1951-06-23 DOA: 03/09/2024 PCP: Louis Row, MD   LOS: 3 days   Brief narrative:  73 year old with breast cancer status post left mastectomy currently on chemotherapy, essential hypertension, CKD, sleep apnea admitted with intractable nausea vomiting that started about 4 days ago. Last chemotherapy was 5/27. Intolerance to oral intake. Patient hemodynamically stable in the ER.  Patient presented to the hospital for further evaluation and treatment.   Assessment/Plan: Principal Problem:   Nausea & vomiting Active Problems:   Hypokalemia   Breast cancer, left breast (HCC)   Chemotherapy induced nausea and vomiting  Intractable nausea vomiting due to chemotherapy: Continue supportive care including IV fluids antiemetics Protonix sucralfate.  Does not feel at her baseline yet.  Wants to try to eat more.   Hypokalemia: Replenished but no latest BMP available will recheck.  Will give 40 mEq of IV potassium today.  Repeat potassium again at 3.1.  Will give additional 40 mEq iv and po today.  Check levels in AM.  Breast cancer stage IIa, currently on chemotherapy and undergoing daily radiation.   Essential hypertension: Not on medications at home.  Blood pressure marginally high.  Add IV hydralazine.  CKD stage II:  At about baseline.  Creatinine of 0.8  Chronic diastolic heart failure: Compensated.  Required gentle IV fluids.    Mood disorder: Continue sertraline .   DVT prophylaxis: enoxaparin  (LOVENOX ) injection 40 mg Start: 03/10/24 1000   Disposition: Likely home 03/14/2024  Status is: Inpatient Remains inpatient appropriate because: Pending clinical improvement, electrolyte imbalance, poor oral tolerance    Code Status:     Code Status: Full Code  Family Communication: None at bedside  Consultants: None  Procedures: Radiation treatment  Anti-infectives:  None  Anti-infectives (From admission, onward)     None        Subjective: Today, patient was seen and examined at bedside.  Was able to keep things down but still does not have good appetite.  Potassium noted to be very low.  Objective: Vitals:   03/12/24 2108 03/13/24 0502  BP: (!) 176/81 (!) 155/83  Pulse: 84 86  Resp: 14 14  Temp: 99 F (37.2 C) 98.2 F (36.8 C)  SpO2: 91% 95%   No intake or output data in the 24 hours ending 03/13/24 1055 Filed Weights   03/09/24 1756 03/10/24 0239  Weight: 83.5 kg 83.2 kg   Body mass index is 30.52 kg/m.   Physical Exam:  GENERAL: Patient is alert awake and oriented. Not in obvious distress.  Obese built HENT: No scleral pallor or icterus. Pupils equally reactive to light. Oral mucosa is moist NECK: is supple, no gross swelling noted. CHEST: Clear to auscultation. No crackles or wheezes.   CVS: S1 and S2 heard, no murmur. Regular rate and rhythm.  ABDOMEN: Soft, non-tender, bowel sounds are present. EXTREMITIES: No edema. CNS: Cranial nerves are intact. No focal motor deficits. SKIN: warm and dry without rashes.  Data Review: I have personally reviewed the following laboratory data and studies,  CBC: Recent Labs  Lab 03/08/24 1432 03/08/24 1709 03/09/24 1817 03/10/24 0500 03/11/24 0602  WBC 7.4 7.3 10.0 7.8 5.7  NEUTROABS 5.2 5.5 7.0  --  3.8  HGB 16.2* 15.3* 16.2* 14.3 14.3  HCT 45.5 44.9 46.7* 42.8 43.2  MCV 86.2 90.2 86.8 91.3 91.7  PLT 199 171 234 178 167   Basic Metabolic Panel: Recent Labs  Lab 03/08/24 1432 03/08/24 1709 03/09/24 1817 03/10/24  0500 03/11/24 0602  NA 138 136 138 136 134*  K 3.3* 3.6 3.4* 3.1* 3.0*  CL 101 103 98 102 100  CO2 26 23 23 25 26   GLUCOSE 118* 120* 115* 119* 133*  BUN 10 10 9 11  7*  CREATININE 0.83 0.86 0.92 0.83 0.82  CALCIUM  10.4* 9.3 10.7* 9.3 9.2  MG 1.9  --   --  2.0 2.0  PHOS  --   --   --  3.3 2.9   Liver Function Tests: Recent Labs  Lab 03/08/24 1432 03/08/24 1709 03/09/24 1817 03/11/24 0602  AST 34 35  45* 35  ALT 22 23 26 23   ALKPHOS 113 95 123 92  BILITOT 1.4* 1.3* 1.1 1.0  PROT 7.6 6.9 7.6 6.7  ALBUMIN  4.6 3.7 4.6 3.5   Recent Labs  Lab 03/08/24 1709  LIPASE 35   No results for input(s): AMMONIA  in the last 168 hours. Cardiac Enzymes: No results for input(s): CKTOTAL, CKMB, CKMBINDEX, TROPONINI in the last 168 hours. BNP (last 3 results) No results for input(s): BNP in the last 8760 hours.  ProBNP (last 3 results) No results for input(s): PROBNP in the last 8760 hours.  CBG: No results for input(s): GLUCAP in the last 168 hours. No results found for this or any previous visit (from the past 240 hours).   Studies: No results found.    Preslea Rhodus, MD  [facility] 03/13/2024  If 7PM-7AM, please contact night-coverage

## 2024-03-14 ENCOUNTER — Other Ambulatory Visit: Payer: Self-pay

## 2024-03-14 ENCOUNTER — Ambulatory Visit

## 2024-03-14 ENCOUNTER — Ambulatory Visit
Admission: RE | Admit: 2024-03-14 | Discharge: 2024-03-14 | Disposition: A | Discharge status: Home / Self Care | Source: Ambulatory Visit | Attending: Radiation Oncology | Admitting: Radiation Oncology

## 2024-03-14 LAB — RAD ONC ARIA SESSION SUMMARY
Course Elapsed Days: 24
Plan Fractions Treated to Date: 17
Plan Fractions Treated to Date: 9
Plan Prescribed Dose Per Fraction: 1.8 Gy
Plan Prescribed Dose Per Fraction: 1.8 Gy
Plan Total Fractions Prescribed: 14
Plan Total Fractions Prescribed: 28
Plan Total Prescribed Dose: 25.2 Gy
Plan Total Prescribed Dose: 50.4 Gy
Reference Point Dosage Given to Date: 16.2 Gy
Reference Point Dosage Given to Date: 30.6 Gy
Reference Point Session Dosage Given: 1.8 Gy
Reference Point Session Dosage Given: 1.8 Gy
Session Number: 17

## 2024-03-14 LAB — BASIC METABOLIC PANEL WITH GFR
Anion gap: 10 (ref 5–15)
BUN: 8 mg/dL (ref 8–23)
CO2: 27 mmol/L (ref 22–32)
Calcium: 9.8 mg/dL (ref 8.9–10.3)
Chloride: 99 mmol/L (ref 98–111)
Creatinine, Ser: 0.83 mg/dL (ref 0.44–1.00)
GFR, Estimated: >60 mL/min (ref >60)
Glucose, Bld: 103 mg/dL — ABNORMAL HIGH (ref 70–99)
Potassium: 3.4 mmol/L — ABNORMAL LOW (ref 3.5–5.1)
Sodium: 136 mmol/L (ref 135–145)

## 2024-03-14 LAB — CBC
HCT: 44.4 % (ref 36.0–46.0)
Hemoglobin: 14.7 g/dL (ref 12.0–15.0)
MCH: 29.9 pg (ref 26.0–34.0)
MCHC: 33.1 g/dL (ref 30.0–36.0)
MCV: 90.4 fL (ref 80.0–100.0)
Platelets: 174 10*3/uL (ref 150–400)
RBC: 4.91 MIL/uL (ref 3.87–5.11)
RDW: 13.9 % (ref 11.5–15.5)
WBC: 5.6 10*3/uL (ref 4.0–10.5)
nRBC: 0 % (ref 0.0–0.2)

## 2024-03-14 LAB — MAGNESIUM: Magnesium: 2.1 mg/dL (ref 1.7–2.4)

## 2024-03-14 MED ORDER — POTASSIUM CHLORIDE 20 MEQ PO PACK: 20.0000 meq | PACK | Freq: Every day | ORAL | 0 refills | Status: DC

## 2024-03-14 MED ORDER — SUCRALFATE 1 GM/10ML PO SUSP: 1.0000 g | Freq: Three times a day (TID) | ORAL | 0 refills | Status: AC

## 2024-03-14 MED ORDER — HEPARIN SOD (PORK) LOCK FLUSH 100 UNIT/ML IV SOLN
500.0000 [IU] | Freq: Once | INTRAVENOUS | Status: AC
  Administered 2024-03-14: 500 [IU] via INTRAVENOUS
  Filled 2024-03-14: qty 5

## 2024-03-14 MED ORDER — PANTOPRAZOLE SODIUM 40 MG PO TBEC: 40.0000 mg | DELAYED_RELEASE_TABLET | Freq: Every day | ORAL | 2 refills | Status: DC

## 2024-03-14 MED ORDER — POTASSIUM CHLORIDE 20 MEQ PO PACK
40.0000 meq | PACK | Freq: Once | ORAL | Status: AC
  Administered 2024-03-14: 40 meq via ORAL
  Filled 2024-03-14: qty 2

## 2024-03-14 NOTE — Care Management Important Message (Signed)
 Important Message  Patient Details IM Letter given. Name: Rebecca Steele MRN: [id number] Date of Birth: 1951-04-05   Important Message Given:  Yes - Medicare IM     [name] 03/14/2024, 11:57 AM

## 2024-03-14 NOTE — Discharge Summary (Addendum)
 Physician Discharge Summary  Rebecca Steele ZOX:[id number] DOB: Jan 16, 1951 DOA: 03/09/2024  PCP: Louis Row, MD  Admit date: 03/09/2024 Discharge date: 03/14/2024  Admitted From: Home  Discharge disposition: Home   Recommendations for Outpatient Follow-Up:   Follow up with your primary care provider in one week.  Check CBC, BMP, magnesium in the next visit   Discharge Diagnosis:   Principal Problem:   Nausea & vomiting Active Problems:   Hypokalemia   Breast cancer, left breast (HCC)   Chemotherapy induced nausea and vomiting    Discharge Condition: Improved.  Diet recommendation: soft  Wound care: None.  Code status: Full.   History of Present Illness:   73 year old with breast cancer status post left mastectomy currently on chemotherapy, essential hypertension, CKD, sleep apnea admitted with intractable nausea vomiting that started about 4 days ago. Last chemotherapy was 5/27. Intolerance to oral intake. Patient hemodynamically stable in the ER. Patient presented to the hospital for further evaluation and treatment.    Hospital Course:   Following conditions were addressed during hospitalization as listed below,  Intractable nausea vomiting due to chemotherapy: Improved at this time.  During hospitalization patient received supportive care including IV fluids antiemetics Protonix sucralfate.     Hypokalemia: Replenished   Breast cancer stage IIa, currently on chemotherapy and undergoing daily radiation.  Follow-up as outpatient.   Essential hypertension: Not on medications at home.     CKD stage II:  At about baseline.  Creatinine of 0.8   Chronic diastolic heart failure: Compensated.  Initially required IV fluids.   Mood disorder: Continue sertraline .   Disposition.  At this time, patient is stable for disposition home with outpatient PCP and follow-up  Medical Consultants:   None.  Procedures:    Radiation treatment Subjective:    Today, patient was seen and examined at bedside.  Denies any nausea, vomiting, shortness of breath, dyspnea or chest pain.  Feels okay about going home.  Discharge Exam:   Vitals:   03/13/24 2124 03/14/24 0517  BP: (!) 162/80 (!) 158/73  Pulse: 82 82  Resp: 14 14  Temp: 99.4 F (37.4 C) 98.3 F (36.8 C)  SpO2: 93% 92%   Vitals:   03/13/24 0502 03/13/24 1400 03/13/24 2124 03/14/24 0517  BP: (!) 155/83 (!) 149/75 (!) 162/80 (!) 158/73  Pulse: 86 81 82 82  Resp: 14 16 14 14   Temp: 98.2 F (36.8 C) (!) 97.3 F (36.3 C) 99.4 F (37.4 C) 98.3 F (36.8 C)  TempSrc: Oral Oral Oral Oral  SpO2: 95% 93% 93% 92%  Weight:      Height:        General: Alert awake, not in obvious distress, obese HENT: pupils equally reacting to light,  No scleral pallor or icterus noted. Oral mucosa is moist.  Chest:  Clear breath sounds.    Right chest wall port. CVS: S1 &S2 heard. No murmur.  Regular rate and rhythm. Abdomen: Soft, nontender, nondistended.  Bowel sounds are heard.   Extremities: No cyanosis, clubbing or edema.  Peripheral pulses are palpable. Psych: Alert, awake and oriented, normal mood CNS:  No cranial nerve deficits.  Power equal in all extremities.   Skin: Warm and dry.  No rashes noted.  The results of significant diagnostics from this hospitalization (including imaging, microbiology, ancillary and laboratory) are listed below for reference.     Diagnostic Studies:   No results found.   Labs:   Basic Metabolic Panel: Recent Labs  Lab  03/08/24 1432 03/08/24 1709 03/09/24 1817 03/10/24 0500 03/11/24 0602 03/13/24 1057 03/14/24 0500  NA 138   < > 138 136 134* 134* 136  K 3.3*   < > 3.4* 3.1* 3.0* 3.1* 3.4*  CL 101   < > 98 102 100 100 99  CO2 26   < > 23 25 26 25 27   GLUCOSE 118*   < > 115* 119* 133* 107* 103*  BUN 10   < > 9 11 7* 9 8  CREATININE 0.83   < > 0.92 0.83 0.82 0.86 0.83  CALCIUM  10.4*   < > 10.7* 9.3 9.2 9.7 9.8  MG 1.9  --   --  2.0 2.0  --   2.1  PHOS  --   --   --  3.3 2.9  --   --    < > = values in this interval not displayed.   GFR Estimated Creatinine Clearance: 65.3 mL/min (by C-G formula based on SCr of 0.83 mg/dL). Liver Function Tests: Recent Labs  Lab 03/08/24 1432 03/08/24 1709 03/09/24 1817 03/11/24 0602  AST 34 35 45* 35  ALT 22 23 26 23   ALKPHOS 113 95 123 92  BILITOT 1.4* 1.3* 1.1 1.0  PROT 7.6 6.9 7.6 6.7  ALBUMIN  4.6 3.7 4.6 3.5   Recent Labs  Lab 03/08/24 1709  LIPASE 35   No results for input(s): AMMONIA  in the last 168 hours. Coagulation profile No results for input(s): INR, PROTIME in the last 168 hours.  CBC: Recent Labs  Lab 03/08/24 1432 03/08/24 1709 03/09/24 1817 03/10/24 0500 03/11/24 0602 03/14/24 0500  WBC 7.4 7.3 10.0 7.8 5.7 5.6  NEUTROABS 5.2 5.5 7.0  --  3.8  --   HGB 16.2* 15.3* 16.2* 14.3 14.3 14.7  HCT 45.5 44.9 46.7* 42.8 43.2 44.4  MCV 86.2 90.2 86.8 91.3 91.7 90.4  PLT 199 171 234 178 167 174   Cardiac Enzymes: No results for input(s): CKTOTAL, CKMB, CKMBINDEX, TROPONINI in the last 168 hours. BNP: Invalid input(s): POCBNP CBG: No results for input(s): GLUCAP in the last 168 hours. D-Dimer No results for input(s): DDIMER in the last 72 hours. Hgb A1c No results for input(s): HGBA1C in the last 72 hours. Lipid Profile No results for input(s): CHOL, HDL, LDLCALC, TRIG, CHOLHDL, LDLDIRECT in the last 72 hours. Thyroid  function studies No results for input(s): TSH, T4TOTAL, T3FREE, THYROIDAB in the last 72 hours.  Invalid input(s): FREET3 Anemia work up No results for input(s): VITAMINB12, FOLATE, FERRITIN, TIBC, IRON, RETICCTPCT in the last 72 hours. Microbiology No results found for this or any previous visit (from the past 240 hours).   Discharge Instructions:   Discharge Instructions     Diet - low sodium heart healthy   Complete by: As directed    Discharge instructions   Complete by: As  directed    Follow-up with your primary care provider in 1 week and check blood work at that time.  Seek medical attention for worsening symptoms. Follow up with oncology as scheduled by clinic or you.   Increase activity slowly   Complete by: As directed       Allergies as of 03/14/2024       Reactions   Gadolinium Derivatives Anaphylaxis, Shortness Of Breath   Pt was given 17ml multihance  for MRI. After about 16min30 seconds she squeezed the ball saying she felt like her throat was closing up. Exam was DC'd and radiologist gave 75mg  benadryl  IV.  Other    Pt states she was given Gabapentin  and Fentanyl  for anesthesia during recent surgery and was unable to wake up for several hours.  Pt states she does not want to have sedation with those medications in the future.   Valium Anaphylaxis   Paxlovid [nirmatrelvir-ritonavir] Hives   Doxycycline  Swelling   Facial swelling Pt has since had without problem   Fentanyl  Other (See Comments)   Slept the whole time for few days   Iohexol  Itching    Desc: Pt. stated she had iv contrast around 25 yrs ago and had itching on her neck.  she took benadryl  and it went away.  The rad recommended premeds and be done tomorrow but the pt.did not want to do that so we did her w/o iv contrast today., Onset Date: [id number]   Macrodantin Nausea And Vomiting   Red Dye #40 (allura Red) Swelling   Facial swelling (cannot take pink or red tablets or capsules)   Rocephin [ceftriaxone] Other (See Comments)   Joint aches   Zithromax [azithromycin] Other (See Comments)   Weakness, cold/clammy, legs trembling        Medication List     TAKE these medications    butalbital -acetaminophen -caffeine  50-325-40 MG tablet Commonly known as: FIORICET Take 1 tablet by mouth 2 (two) times daily as needed for headache or migraine.   levothyroxine  75 MCG tablet Commonly known as: SYNTHROID  Take 75 mcg by mouth daily before breakfast.   lidocaine -prilocaine   cream Commonly known as: EMLA  Apply to affected area once   loratadine  10 MG tablet Commonly known as: CLARITIN  Take 10 mg by mouth daily.   metoCLOPramide  10 MG tablet Commonly known as: Reglan  Take 1 tablet (10 mg total) by mouth every 6 (six) hours as needed for nausea (nausea/headache).   montelukast  10 MG tablet Commonly known as: SINGULAIR  Take 10 mg by mouth at bedtime.   ondansetron  8 MG disintegrating tablet Commonly known as: ZOFRAN -ODT Take 1 tablet (8 mg total) by mouth every 8 (eight) hours as needed for nausea or vomiting.   pantoprazole 40 MG tablet Commonly known as: PROTONIX Take 1 tablet (40 mg total) by mouth daily. Start taking on: March 15, 2024   potassium chloride  20 MEQ packet Commonly known as: KLOR-CON  Take 20 mEq by mouth daily for 5 days.   prochlorperazine  10 MG tablet Commonly known as: COMPAZINE  Take 1 tablet (10 mg total) by mouth every 6 (six) hours as needed for nausea or vomiting.   sertraline  50 MG tablet Commonly known as: ZOLOFT  Take 50 mg by mouth daily.   sucralfate 1 GM/10ML suspension Commonly known as: CARAFATE Take 10 mLs (1 g total) by mouth 4 (four) times daily -  with meals and at bedtime.   traZODone  100 MG tablet Commonly known as: DESYREL  Take 100 mg by mouth at bedtime.        Follow-up Information     Louis Row, MD Follow up.   Specialty: Internal Medicine Contact information: [address] [place] [address] [phone]                  Time coordinating discharge: 39 minutes  Signed:  Ellwyn Ergle  [facility] 03/14/2024, 5:40 PM

## 2024-03-14 NOTE — Plan of Care (Signed)
  Problem: Clinical Measurements: Goal: Will remain free from infection Outcome: Progressing   Problem: Activity: Goal: Risk for activity intolerance will decrease Outcome: Progressing   Problem: Nutrition: Goal: Adequate nutrition will be maintained Outcome: Progressing   Problem: Elimination: Goal: Will not experience complications related to bowel motility Outcome: Progressing Goal: Will not experience complications related to urinary retention Outcome: Progressing   Problem: Pain Managment: Goal: General experience of comfort will improve and/or be controlled Outcome: Progressing   Problem: Safety: Goal: Ability to remain free from injury will improve Outcome: Progressing   Problem: Skin Integrity: Goal: Risk for impaired skin integrity will decrease Outcome: Progressing

## 2024-03-14 NOTE — Plan of Care (Addendum)
 Patient is alert and oriented X4, husband at the bedside. Patient discharge orders placed, all appointments, medications, and discharge instructions went over with pt and husband. Patient awaiting radiation txt & will be d/c home after. No further needs  Problem: Education: Goal: Knowledge of General Education information will improve Description: Including pain rating scale, medication(s)/side effects and non-pharmacologic comfort measures 03/14/2024 1118 by Mozqueda Jaramillo, Hadia Minier Alondra, RN Outcome: Adequate for Discharge 03/14/2024 1118 by Mozqueda Jaramillo, Noam Franzen Alondra, RN Outcome: Adequate for Discharge   Problem: Health Behavior/Discharge Planning: Goal: Ability to manage health-related needs will improve 03/14/2024 1118 by Mozqueda Jaramillo, Moody Appl, RN Outcome: Adequate for Discharge 03/14/2024 1118 by Mozqueda Jaramillo, Fredy Gladu Alondra, RN Outcome: Adequate for Discharge   Problem: Clinical Measurements: Goal: Ability to maintain clinical measurements within normal limits will improve 03/14/2024 1118 by Uvaldo Garfinkel, Moody Appl, RN Outcome: Adequate for Discharge 03/14/2024 1118 by Mozqueda Jaramillo, Cassidee Deats Alondra, RN Outcome: Adequate for Discharge Goal: Will remain free from infection 03/14/2024 1118 by Mozqueda Jaramillo, Moody Appl, RN Outcome: Adequate for Discharge 03/14/2024 1118 by Mozqueda Jaramillo, Ngai Parcell Alondra, RN Outcome: Adequate for Discharge Goal: Diagnostic test results will improve 03/14/2024 1118 by Mozqueda Jaramillo, Moody Appl, RN Outcome: Adequate for Discharge 03/14/2024 1118 by Mozqueda Jaramillo, Katlin Bortner Alondra, RN Outcome: Adequate for Discharge Goal: Respiratory complications will improve 03/14/2024 1118 by Mozqueda Jaramillo, Trevor Duty Alondra, RN Outcome: Adequate for Discharge 03/14/2024 1118 by Mozqueda Jaramillo, Daishaun Ayre Alondra, RN Outcome: Adequate for Discharge Goal: Cardiovascular complication will be  avoided 03/14/2024 1118 by Mozqueda Jaramillo, Moody Appl, RN Outcome: Adequate for Discharge 03/14/2024 1118 by Mozqueda Jaramillo, Kylar Leonhardt Alondra, RN Outcome: Adequate for Discharge   Problem: Activity: Goal: Risk for activity intolerance will decrease 03/14/2024 1118 by Mozqueda Jaramillo, Moody Appl, RN Outcome: Adequate for Discharge 03/14/2024 1118 by Mozqueda Jaramillo, Nekita Pita Alondra, RN Outcome: Adequate for Discharge   Problem: Nutrition: Goal: Adequate nutrition will be maintained 03/14/2024 1118 by Mozqueda Jaramillo, Rashawna Scoles Alondra, RN Outcome: Adequate for Discharge 03/14/2024 1118 by Mozqueda Jaramillo, Daijon Wenke Alondra, RN Outcome: Adequate for Discharge   Problem: Coping: Goal: Level of anxiety will decrease 03/14/2024 1118 by Mozqueda Jaramillo, Moody Appl, RN Outcome: Adequate for Discharge 03/14/2024 1118 by Mozqueda Jaramillo, Shavon Ashmore Alondra, RN Outcome: Adequate for Discharge   Problem: Elimination: Goal: Will not experience complications related to bowel motility 03/14/2024 1118 by Mozqueda Jaramillo, Jaramiah Bossard Alondra, RN Outcome: Adequate for Discharge 03/14/2024 1118 by Mozqueda Jaramillo, Antolin Belsito Alondra, RN Outcome: Adequate for Discharge Goal: Will not experience complications related to urinary retention 03/14/2024 1118 by Mozqueda Jaramillo, Moody Appl, RN Outcome: Adequate for Discharge 03/14/2024 1118 by Mozqueda Jaramillo, Aivah Putman Alondra, RN Outcome: Adequate for Discharge   Problem: Pain Managment: Goal: General experience of comfort will improve and/or be controlled 03/14/2024 1118 by Mozqueda Jaramillo, Moody Appl, RN Outcome: Adequate for Discharge 03/14/2024 1118 by Mozqueda Jaramillo, Cornelio Parkerson Alondra, RN Outcome: Adequate for Discharge   Problem: Safety: Goal: Ability to remain free from injury will improve 03/14/2024 1118 by Uvaldo Garfinkel, Moody Appl, RN Outcome: Adequate for Discharge 03/14/2024 1118 by Mozqueda  Jaramillo, Reha Martinovich Alondra, RN Outcome: Adequate for Discharge   Problem: Skin Integrity: Goal: Risk for impaired skin integrity will decrease 03/14/2024 1118 by Mozqueda Jaramillo, Cloteal Isaacson Alondra, RN Outcome: Adequate for Discharge 03/14/2024 1118 by Mozqueda Jaramillo, Melonee Gerstel Alondra, RN Outcome: Adequate for Discharge

## 2024-03-15 ENCOUNTER — Ambulatory Visit

## 2024-03-15 ENCOUNTER — Ambulatory Visit
Admission: RE | Admit: 2024-03-15 | Discharge: 2024-03-15 | Disposition: A | Discharge status: Home / Self Care | Source: Ambulatory Visit | Attending: Radiation Oncology

## 2024-03-15 ENCOUNTER — Other Ambulatory Visit: Payer: Self-pay

## 2024-03-15 ENCOUNTER — Ambulatory Visit
Admission: RE | Admit: 2024-03-15 | Discharge: 2024-03-15 | Disposition: A | Discharge status: Home / Self Care | Source: Ambulatory Visit | Attending: Radiation Oncology | Admitting: Radiation Oncology

## 2024-03-15 ENCOUNTER — Ambulatory Visit: Admission: RE | Admit: 2024-03-15 | Source: Ambulatory Visit | Admitting: Radiation Oncology

## 2024-03-15 ENCOUNTER — Other Ambulatory Visit: Payer: Self-pay | Admitting: Radiation Oncology

## 2024-03-15 DIAGNOSIS — Z79899 Other long term (current) drug therapy: Secondary | ICD-10-CM | POA: Diagnosis not present

## 2024-03-15 DIAGNOSIS — C50912 Malignant neoplasm of unspecified site of left female breast: Secondary | ICD-10-CM | POA: Diagnosis present

## 2024-03-15 DIAGNOSIS — Z171 Estrogen receptor negative status [ER-]: Secondary | ICD-10-CM | POA: Diagnosis present

## 2024-03-15 DIAGNOSIS — N6091 Unspecified benign mammary dysplasia of right breast: Secondary | ICD-10-CM | POA: Diagnosis present

## 2024-03-15 LAB — RAD ONC ARIA SESSION SUMMARY
Course Elapsed Days: 25
Plan Fractions Treated to Date: 18
Plan Fractions Treated to Date: 9
Plan Prescribed Dose Per Fraction: 1.8 Gy
Plan Prescribed Dose Per Fraction: 1.8 Gy
Plan Total Fractions Prescribed: 14
Plan Total Fractions Prescribed: 28
Plan Total Prescribed Dose: 25.2 Gy
Plan Total Prescribed Dose: 50.4 Gy
Reference Point Dosage Given to Date: 16.2 Gy
Reference Point Dosage Given to Date: 32.4 Gy
Reference Point Session Dosage Given: 1.8 Gy
Reference Point Session Dosage Given: 1.8 Gy
Session Number: 18

## 2024-03-15 MED ORDER — POTASSIUM CHLORIDE CRYS ER 10 MEQ PO TBCR: 10.0000 meq | EXTENDED_RELEASE_TABLET | Freq: Two times a day (BID) | ORAL | 0 refills | Status: DC

## 2024-03-18 ENCOUNTER — Ambulatory Visit
Admission: RE | Admit: 2024-03-18 | Discharge: 2024-03-18 | Disposition: A | Discharge status: Home / Self Care | Source: Ambulatory Visit | Attending: Radiation Oncology

## 2024-03-18 ENCOUNTER — Telehealth: Payer: Self-pay

## 2024-03-18 ENCOUNTER — Other Ambulatory Visit: Payer: Self-pay

## 2024-03-18 DIAGNOSIS — N6091 Unspecified benign mammary dysplasia of right breast: Secondary | ICD-10-CM

## 2024-03-18 DIAGNOSIS — C50912 Malignant neoplasm of unspecified site of left female breast: Secondary | ICD-10-CM | POA: Diagnosis not present

## 2024-03-18 LAB — RAD ONC ARIA SESSION SUMMARY
Course Elapsed Days: 28
Plan Fractions Treated to Date: 10
Plan Fractions Treated to Date: 19
Plan Prescribed Dose Per Fraction: 1.8 Gy
Plan Prescribed Dose Per Fraction: 1.8 Gy
Plan Total Fractions Prescribed: 14
Plan Total Fractions Prescribed: 28
Plan Total Prescribed Dose: 25.2 Gy
Plan Total Prescribed Dose: 50.4 Gy
Reference Point Dosage Given to Date: 18 Gy
Reference Point Dosage Given to Date: 34.2 Gy
Reference Point Session Dosage Given: 1.8 Gy
Reference Point Session Dosage Given: 1.8 Gy
Session Number: 19

## 2024-03-18 MED ORDER — RADIAPLEXRX EX GEL
Freq: Once | CUTANEOUS | Status: AC
  Administered 2024-03-18: 1 via TOPICAL

## 2024-03-18 NOTE — Telephone Encounter (Signed)
 Verbally confirmed appts for 6/17

## 2024-03-19 ENCOUNTER — Inpatient Hospital Stay: Payer: HMO

## 2024-03-19 ENCOUNTER — Inpatient Hospital Stay (HOSPITAL_BASED_OUTPATIENT_CLINIC_OR_DEPARTMENT_OTHER): Payer: HMO | Admitting: Hematology and Oncology

## 2024-03-19 ENCOUNTER — Telehealth: Payer: Self-pay

## 2024-03-19 ENCOUNTER — Other Ambulatory Visit: Payer: Self-pay

## 2024-03-19 ENCOUNTER — Ambulatory Visit
Admission: RE | Admit: 2024-03-19 | Discharge: 2024-03-19 | Disposition: A | Discharge status: Home / Self Care | Source: Ambulatory Visit | Attending: Radiation Oncology | Admitting: Radiation Oncology

## 2024-03-19 VITALS — BP 125/68 | HR 85 | Temp 97.6°F | Resp 17 | Wt 179.0 lb

## 2024-03-19 DIAGNOSIS — Z171 Estrogen receptor negative status [ER-]: Secondary | ICD-10-CM | POA: Diagnosis not present

## 2024-03-19 DIAGNOSIS — C50912 Malignant neoplasm of unspecified site of left female breast: Secondary | ICD-10-CM | POA: Diagnosis not present

## 2024-03-19 DIAGNOSIS — C50212 Malignant neoplasm of upper-inner quadrant of left female breast: Secondary | ICD-10-CM

## 2024-03-19 DIAGNOSIS — Z95828 Presence of other vascular implants and grafts: Secondary | ICD-10-CM

## 2024-03-19 LAB — CMP (CANCER CENTER ONLY)
ALT: 28 U/L (ref 0–44)
AST: 34 U/L (ref 15–41)
Albumin: 4.2 g/dL (ref 3.5–5.0)
Alkaline Phosphatase: 77 U/L (ref 38–126)
Anion gap: 7 (ref 5–15)
BUN: 8 mg/dL (ref 8–23)
CO2: 27 mmol/L (ref 22–32)
Calcium: 10.1 mg/dL (ref 8.9–10.3)
Chloride: 105 mmol/L (ref 98–111)
Creatinine: 0.96 mg/dL (ref 0.44–1.00)
GFR, Estimated: >60 mL/min (ref >60)
Glucose, Bld: 100 mg/dL — ABNORMAL HIGH (ref 70–99)
Potassium: 3.9 mmol/L (ref 3.5–5.1)
Sodium: 139 mmol/L (ref 135–145)
Total Bilirubin: 1 mg/dL (ref 0.0–1.2)
Total Protein: 6.9 g/dL (ref 6.5–8.1)

## 2024-03-19 LAB — CBC WITH DIFFERENTIAL (CANCER CENTER ONLY)
Abs Immature Granulocytes: 0.03 10*3/uL (ref 0.00–0.07)
Basophils Absolute: 0.1 10*3/uL (ref 0.0–0.1)
Basophils Relative: 2 %
Eosinophils Absolute: 0.2 10*3/uL (ref 0.0–0.5)
Eosinophils Relative: 3 %
HCT: 43.4 % (ref 36.0–46.0)
Hemoglobin: 14.6 g/dL (ref 12.0–15.0)
Immature Granulocytes: 1 %
Lymphocytes Relative: 14 %
Lymphs Abs: 0.8 10*3/uL (ref 0.7–4.0)
MCH: 30.2 pg (ref 26.0–34.0)
MCHC: 33.6 g/dL (ref 30.0–36.0)
MCV: 89.7 fL (ref 80.0–100.0)
Monocytes Absolute: 0.8 10*3/uL (ref 0.1–1.0)
Monocytes Relative: 14 %
Neutro Abs: 3.7 10*3/uL (ref 1.7–7.7)
Neutrophils Relative %: 66 %
Platelet Count: 223 10*3/uL (ref 150–400)
RBC: 4.84 MIL/uL (ref 3.87–5.11)
RDW: 14.1 % (ref 11.5–15.5)
WBC Count: 5.6 10*3/uL (ref 4.0–10.5)
nRBC: 0 % (ref 0.0–0.2)

## 2024-03-19 LAB — RAD ONC ARIA SESSION SUMMARY
Course Elapsed Days: 29
Plan Fractions Treated to Date: 10
Plan Fractions Treated to Date: 20
Plan Prescribed Dose Per Fraction: 1.8 Gy
Plan Prescribed Dose Per Fraction: 1.8 Gy
Plan Total Fractions Prescribed: 14
Plan Total Fractions Prescribed: 28
Plan Total Prescribed Dose: 25.2 Gy
Plan Total Prescribed Dose: 50.4 Gy
Reference Point Dosage Given to Date: 18 Gy
Reference Point Dosage Given to Date: 36 Gy
Reference Point Session Dosage Given: 1.8 Gy
Reference Point Session Dosage Given: 1.8 Gy
Session Number: 20

## 2024-03-19 LAB — TSH: TSH: 2.47 u[IU]/mL (ref 0.350–4.500)

## 2024-03-19 MED ORDER — ACETAMINOPHEN 325 MG PO TABS
650.0000 mg | ORAL_TABLET | Freq: Once | ORAL | Status: AC
  Administered 2024-03-19: 650 mg via ORAL
  Filled 2024-03-19: qty 2

## 2024-03-19 MED ORDER — CETIRIZINE HCL 10 MG PO TABS
10.0000 mg | ORAL_TABLET | Freq: Once | ORAL | Status: AC
  Administered 2024-03-19: 10 mg via ORAL
  Filled 2024-03-19: qty 1

## 2024-03-19 MED ORDER — HEPARIN SOD (PORK) LOCK FLUSH 100 UNIT/ML IV SOLN
500.0000 [IU] | Freq: Once | INTRAVENOUS | Status: AC | PRN
  Administered 2024-03-19: 500 [IU]

## 2024-03-19 MED ORDER — SODIUM CHLORIDE 0.9 % IV SOLN: INTRAVENOUS | Status: DC

## 2024-03-19 MED ORDER — SODIUM CHLORIDE 0.9 % IV SOLN
3.0000 mg/kg | Freq: Once | INTRAVENOUS | Status: AC
  Administered 2024-03-19: 260 mg via INTRAVENOUS
  Filled 2024-03-19: qty 8

## 2024-03-19 MED ORDER — SODIUM CHLORIDE 0.9% FLUSH
10.0000 mL | INTRAVENOUS | Status: DC | PRN
  Administered 2024-03-19: 10 mL

## 2024-03-19 MED ORDER — SODIUM CHLORIDE 0.9% FLUSH
10.0000 mL | Freq: Once | INTRAVENOUS | Status: AC
  Administered 2024-03-19: 10 mL

## 2024-03-19 MED ORDER — DOXYCYCLINE HYCLATE 100 MG PO TABS: 100.0000 mg | ORAL_TABLET | Freq: Two times a day (BID) | ORAL | 0 refills | Status: DC

## 2024-03-19 NOTE — Assessment & Plan Note (Signed)
 Assessment and Plan Assessment & Plan Chemotherapy-induced nausea and vomiting Persistent nausea and vomiting with abdominal and back pain post-chemotherapy. CT showed enteritis, likely chemotherapy-related. - Administer fluids on Friday after radiation. - Reduce kadcyla  to 3mg /kg, given toxicity.  Vision changes Blurred and double vision, particularly in the right eye, since starting chemotherapy. Ophthalmology consultation scheduled. - Attend ophthalmology appointment on Friday.  Right breast nodule CT scan indicated a nodule in the right breast. Previous biopsy four years ago. Mammogram planned for further assessment. - Order mammogram for the right breast.  T6 trabecular thickening CT scan showed trabecular thickening at T6. No current pain. Possibly related to previous spine issues. - Consider outpatient PET scan after completion of radiation therapy in July.  Cellulitis of left chest wall with non purulent drainage. No systemic symptoms - Prescribe doxycycline  for infection. Rebecca Steele has taken this before and has no allergy to doxycycline .

## 2024-03-19 NOTE — Telephone Encounter (Signed)
 Notified the pt daughter regarding her FMLA forms being completed. Pt will pick up her forms tomorrow 03/20/2024. No questions or concerns at this time.

## 2024-03-19 NOTE — Progress Notes (Signed)
 Pt observed for 30 minutes post Kadcyla  infusion. Pt tolerated Tx well w/out incident. Ambulatory to lobby.

## 2024-03-19 NOTE — Progress Notes (Signed)
 [facility] CONSULT NOTE  Patient Care Team: Louis Row, MD as PCP - General (Internal Medicine) Enid Harry, MD as Consulting Physician (General Surgery) Thora Flint, MD as Consulting Physician (Obstetrics and Gynecology) Delilah Fend, MD (Inactive) as Consulting Physician (Gastroenterology) Murleen Arms, MD as Consulting Physician (Hematology and Oncology) Auther Bo, RN (Inactive) as Oncology Nurse Navigator Alane Hsu, RN as Oncology Nurse Navigator  CHIEF COMPLAINTS/PURPOSE OF CONSULTATION:  Breast cancer follow up.  HISTORY OF PRESENTING ILLNESS:   Rebecca Steele 73 y.o. female is here because of recent diagnosis of left breast cancer  I reviewed her records extensively and collaborated the history with the patient.  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)  09/22/2023 Mammogram   Patient self palpated a breast mass in her left breast around November 2019 went for diagnostic mammogram, this showed there is a developing density on mammogram, 2 of the lower left axillary lymph nodes exhibit increased cortical thickness, ultrasound confirmed a 2.7 cm irregular hypoechoic mass in left breast at 12:00 5 cm from the nipple along with abnormal left axillary lymph   09/29/2023 Pathology Results   Left breast central superior needle core biopsy 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)   10/20/2023 - 10/23/2023 Chemotherapy   Patient is on Treatment Plan : BREAST  Docetaxel  + Carboplatin  + Trastuzumab  + Pertuzumab   (TCHP) q21d      11/24/2023 Cancer Staging   Staging form: Breast, AJCC 8th  Edition - Pathologic stage from 11/24/2023: ypT2, pN1, cM0, G3, ER-, PR-, HER2+ - Signed by Murleen Arms, MD on 11/24/2023 Stage prefix: Post-therapy Histologic grading system: 3 grade system   01/16/2024 -  Chemotherapy   Patient is on Treatment Plan : BREAST ADO-Trastuzumab Emtansine  (Kadcyla ) q21d       She had left breast lumpectomy which showed Invasive poorly differentiated adenocarcinoma, grade 3, focal high grade DCIS, solid type without necrosis. Tumor measures 3.6 cms in greatest dimension.Invasive tumor in lateral and posterior margins. Pos lateral margin, 2/4 LN with macromets, one with ITC. She had re-excision on 12/12/2023.  History of Present Illness  Rebecca Steele is a 73 year old female with breast cancer  on adj kadcyla  who presents with vision changes and abdominal pain.  She has been experiencing vision changes, including blurred and double vision, which began suddenly and have progressively worsened. Her regular glasses are no longer effective, prompting her to use older glasses that are not suitable for reading. The vision issue is more pronounced in her right eye, which has been problematic since the start of her chemotherapy.  She describes experiencing body aches and vomiting starting on June 5th, with no available appointment for fluids initially. The symptoms persisted, leading to extreme abdominal and back pain by June 6th, necessitating a visit to the emergency department. There, she received Dilaudid  for pain management. A CT scan of the chest, abdomen, and pelvis without contrast revealed enteritis and a spot on the thoracic spine, as well as a nodule in the contralateral breast.  She has been experiencing low potassium levels, described as 'critical' during her recent hospital visit, and has been prescribed potassium  supplements. She also reports a warm, 'angry' area under her arm. No specific pain in the upper mid back area but does experience muscle spasms in  her back.  Rest of the pertinent 10 point ROS reviewed and neg.  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 states she was given gabapentin  and fentanyl  with breast surgery and was diff to wake up   Depression    History of kidney stones    Hypothyroidism    Mitral valve prolapse    Nausea & vomiting 03/10/2024   Sleep apnea    HAS MILD OSA, NO CPAP NEEDED    SURGICAL HISTORY: Past Surgical History:  Procedure Laterality Date   APPENDECTOMY     BREAST BIOPSY Right 09/16/2022   MM RT BREAST BX W LOC DEV 1ST LESION IMAGE BX SPEC STEREO GUIDE 09/16/2022 [facility]   BREAST BIOPSY  12/06/2022   MM RT RADIOACTIVE SEED LOC MAMMO GUIDE 12/06/2022 [facility]   BREAST BIOPSY Left 11/16/2023   US  LT RADIOACTIVE SEED LOC 11/16/2023 [facility]   BREAST BIOPSY  11/16/2023   MM LT RADIOACTIVE SEED EA ADD LESION LOC MAMMO GUIDE 11/16/2023 [facility]   BREAST EXCISIONAL BIOPSY Right 2018   BREAST LUMPECTOMY WITH RADIOACTIVE SEED AND SENTINEL LYMPH NODE BIOPSY Left 11/20/2023   Procedure: LEFT BREAST SEED GUIDED LUMPECTOMY, LEFT AXILLARY SENTINEL NODE BIOPSY;  Surgeon: Enid Harry, MD;  Location: [facility];  Service: General;  Laterality: Left;  PEC BLOCK   CHOLECYSTECTOMY     CYSTOSCOPY W/ RETROGRADES     KYPHOPLASTY N/A 10/05/2021   Procedure: LUMBAR ONE KYPHOPLASTY;  Surgeon: Cannon Champion, MD;  Location: [facility];  Service: Neurosurgery;  Laterality: N/A;   NECK SURGERY     PORTACATH PLACEMENT Right 10/12/2023   Procedure: INSERTION PORT-A-CATH WITH GUIDED ULTRASOUND;  Surgeon: Enid Harry, MD;  Location: [facility];  Service: General;  Laterality: Right;   RADIOACTIVE SEED GUIDED AXILLARY SENTINEL LYMPH NODE Left  11/20/2023   Procedure: LEFT AXILLARY NODE SEED GUIDED EXCISION;  Surgeon: Enid Harry, MD;  Location: [facility];  Service: General;  Laterality: Left;   RADIOACTIVE SEED GUIDED EXCISIONAL BREAST BIOPSY Right 12/12/2017   Procedure: RIGHT RADIOACTIVE SEED GUIDED EXCISIONAL BREAST BIOPSY ERAS PATHWAY;  Surgeon: Enid Harry, MD;  Location: [facility];  Service: General;  Laterality: Right;  LMA   RADIOACTIVE SEED GUIDED EXCISIONAL BREAST BIOPSY Right 12/07/2022   Procedure: RADIOACTIVE SEED GUIDED EXCISIONAL RIGHT BREAST BIOPSY;  Surgeon: Enid Harry, MD;  Location: [facility];  Service: General;  Laterality: Right;   SHOULDER SURGERY     SIMPLE MASTECTOMY WITH AXILLARY SENTINEL NODE BIOPSY Left 12/12/2023   Procedure: LEFT  MASTECTOMY;  Surgeon: Enid Harry, MD;  Location: [facility];  Service: General;  Laterality: Left;   TONSILLECTOMY     TOTAL HIP ARTHROPLASTY Left 06/04/2021   Procedure: TOTAL HIP ARTHROPLASTY ANTERIOR APPROACH;  Surgeon: Neil Balls, MD;  Location: [facility];  Service: Orthopedics;  Laterality: Left;   TRIGGER FINGER RELEASE Right 05/18/2015   Procedure: RIGHT  LONG FINGER TRIGGER RELEASE ;  Surgeon: Rober Chimera, MD;  Location: [facility];  Service: Orthopedics;  Laterality:  Right;    SOCIAL HISTORY: Social History   Socioeconomic History   Marital status: Married    Spouse name: Not on file   Number of children: Not on file   Years of education: Not on file   Highest education level: Not on file  Occupational History   Not on file  Tobacco Use   Smoking status: Never   Smokeless tobacco: Never  Vaping Use   Vaping status: Never Used  Substance and Sexual Activity   Alcohol  use: No   Drug use: No   Sexual activity: Not Currently    Birth control/protection: Post-menopausal  Other Topics Concern   Not on file  Social History Narrative   Not on file    Social Drivers of Health   Financial Resource Strain: Low Risk  (01/01/2024)   Received from [facility]   Overall Financial Resource Strain (CARDIA)    Difficulty of Paying Living Expenses: Not hard at all  Food Insecurity: No Food Insecurity (03/10/2024)   Hunger Vital Sign    Worried About Running Out of Food in the Last Year: Never true    Ran Out of Food in the Last Year: Never true  Transportation Needs: No Transportation Needs (03/10/2024)   PRAPARE - Administrator, Civil Service (Medical): No    Lack of Transportation (Non-Medical): No  Physical Activity: Inactive (04/24/2023)   Received from [facility]   Exercise Vital Sign    On average, how many days per week do you engage in moderate to strenuous exercise (like a brisk walk)?: 0 days    On average, how many minutes do you engage in exercise at this level?: 10 min  Stress: No Stress Concern Present (04/24/2023)   Received from [facility] - Occupational Stress Questionnaire    Feeling of Stress : Not at all  Social Connections: Socially Integrated (03/10/2024)   Social Connection and Isolation Panel    Frequency of Communication with Friends and Family: More than three times a week    Frequency of Social Gatherings with Friends and Family: More than three times a week    Attends Religious Services: More than 4 times per year    Active Member of [employer] or Organizations: Yes    Attends Banker Meetings: More than 4 times per year    Marital Status: Married  Catering manager Violence: Not At Risk (03/10/2024)   Humiliation, Afraid, Rape, and Kick questionnaire    Fear of Current or Ex-Partner: No    Emotionally Abused: No    Physically Abused: No    Sexually Abused: No    FAMILY HISTORY: Family History  Problem Relation Age of Onset   Breast cancer Maternal Aunt        49s    ALLERGIES:  is allergic to gadolinium derivatives, other, valium,  paxlovid [nirmatrelvir-ritonavir], doxycycline , fentanyl , iohexol , macrodantin, red dye #40 (allura red), rocephin [ceftriaxone], and zithromax [azithromycin].  MEDICATIONS:  Current Outpatient Medications  Medication Sig Dispense Refill   doxycycline  (VIBRA -TABS) 100 MG tablet Take 1 tablet (100 mg total) by mouth 2 (two) times daily. 14 tablet 0   butalbital -acetaminophen -caffeine  (FIORICET) 50-325-40 MG tablet Take 1 tablet by mouth 2 (two) times daily as needed for headache or migraine.     levothyroxine  (SYNTHROID ) 75 MCG tablet Take 75 mcg by mouth daily before breakfast.     lidocaine -prilocaine  (EMLA ) cream Apply to affected area once 30 g 3  loratadine  (CLARITIN ) 10 MG tablet Take 10 mg by mouth daily.     metoCLOPramide  (REGLAN ) 10 MG tablet Take 1 tablet (10 mg total) by mouth every 6 (six) hours as needed for nausea (nausea/headache). 10 tablet 0   montelukast  (SINGULAIR ) 10 MG tablet Take 10 mg by mouth at bedtime.     ondansetron  (ZOFRAN -ODT) 8 MG disintegrating tablet Take 1 tablet (8 mg total) by mouth every 8 (eight) hours as needed for nausea or vomiting. 30 tablet 1   pantoprazole  (PROTONIX ) 40 MG tablet Take 1 tablet (40 mg total) by mouth daily. 30 tablet 2   potassium chloride  (KLOR-CON  M) 10 MEQ tablet Take 1 tablet (10 mEq total) by mouth 2 (two) times daily. 10 tablet 0   prochlorperazine  (COMPAZINE ) 10 MG tablet Take 1 tablet (10 mg total) by mouth every 6 (six) hours as needed for nausea or vomiting. 30 tablet 1   sertraline  (ZOLOFT ) 50 MG tablet Take 50 mg by mouth daily.     sucralfate  (CARAFATE ) 1 GM/10ML suspension Take 10 mLs (1 g total) by mouth 4 (four) times daily -  with meals and at bedtime. 414 mL 0   traZODone  (DESYREL ) 100 MG tablet Take 100 mg by mouth at bedtime.     No current facility-administered medications for this visit.   Facility-Administered Medications Ordered in Other Visits  Medication Dose Route Frequency Provider Last Rate Last Admin    0.9 %  sodium chloride  infusion   Intravenous Continuous Landry Lookingbill, MD   Stopped at 03/19/24 1351   sodium chloride  flush (NS) 0.9 % injection 10 mL  10 mL Intracatheter PRN Marayah Higdon, MD   10 mL at 03/19/24 1352    REVIEW OF SYSTEMS:   Constitutional: Denies fevers, chills or abnormal night sweats Eyes: Denies blurriness of vision, double vision or watery eyes Ears, nose, mouth, throat, and face: Denies mucositis or sore throat Respiratory: Denies cough, dyspnea or wheezes Cardiovascular: Denies palpitation, chest discomfort or lower extremity swelling Gastrointestinal:  Denies nausea, heartburn or change in bowel habits Skin: Denies abnormal skin rashes Lymphatics: Denies new lymphadenopathy or easy bruising Neurological:Denies numbness, tingling or new weaknesses Behavioral/Psych: Mood is stable, no new changes  Breast: As mentioned above All other systems were reviewed with the patient and are negative.  PHYSICAL EXAMINATION: ECOG PERFORMANCE STATUS: 0 - Asymptomatic  Vitals:   03/19/24 1036 03/19/24 1037  BP: (!) 151/81 125/68  Pulse: 85   Resp: 17   Temp: 97.6 F (36.4 C)   SpO2: 98%      Filed Weights   03/19/24 1036  Weight: 179 lb (81.2 kg)    GENERAL appears well. No acute distress. Left mastectomy site with near healed wound. No concern for infection CTA bilaterally RRR No LE edema.  LABORATORY DATA:  I have reviewed the data as listed Lab Results  Component Value Date   WBC 5.6 03/19/2024   HGB 14.6 03/19/2024   HCT 43.4 03/19/2024   MCV 89.7 03/19/2024   PLT 223 03/19/2024   Lab Results  Component Value Date   NA 139 03/19/2024   K 3.9 03/19/2024   CL 105 03/19/2024   CO2 27 03/19/2024    RADIOGRAPHIC STUDIES: I have personally reviewed the radiological reports and agreed with the findings in the report.  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 CT showed enteritis, likely chemotherapy-related. - Administer fluids on Friday after radiation. - Reduce kadcyla  to 3mg /kg, given toxicity.  Vision changes Blurred and double vision, particularly in the right eye, since starting chemotherapy. Ophthalmology consultation scheduled. - Attend ophthalmology appointment on Friday.  Right breast nodule CT scan indicated a nodule in the right breast. Previous biopsy four years ago. Mammogram planned for further assessment. - Order mammogram for the right breast.  T6 trabecular thickening CT scan showed trabecular thickening at T6. No current pain. Possibly related to previous spine issues. - Consider outpatient PET scan after completion of radiation therapy in July.  Cellulitis of left chest wall with non purulent drainage. No systemic symptoms - Prescribe doxycycline  for infection. She has taken this before and has no allergy to doxycycline .   Total time spent: 30 min including history, exam, discussion of options, review of records, counseling and coordination of care  All questions were answered. The patient knows to call the clinic with any problems, questions or concerns.    Murleen Arms, MD 03/19/24

## 2024-03-19 NOTE — Patient Instructions (Signed)
 [facility] - A DEPT OF [facility]  Discharge Instructions: Thank you for choosing [facility] to provide your oncology and hematology care.   If you have a lab appointment with the Cancer Center, please go directly to the Cancer Center and check in at the registration area.   Wear comfortable clothing and clothing appropriate for easy access to any Portacath or PICC line.   We strive to give you quality time with your provider. You may need to reschedule your appointment if you arrive late (15 or more minutes).  Arriving late affects you and other patients whose appointments are after yours.  Also, if you miss three or more appointments without notifying the office, you may be dismissed from the clinic at the provider's discretion.      For prescription refill requests, have your pharmacy contact our office and allow 72 hours for refills to be completed.    Today you received the following chemotherapy and/or immunotherapy agents: Kadcyla      To help prevent nausea and vomiting after your treatment, we encourage you to take your nausea medication as directed.  BELOW ARE SYMPTOMS THAT SHOULD BE REPORTED IMMEDIATELY: *FEVER GREATER THAN 100.4 F (38 C) OR HIGHER *CHILLS OR SWEATING *NAUSEA AND VOMITING THAT IS NOT CONTROLLED WITH YOUR NAUSEA MEDICATION *UNUSUAL SHORTNESS OF BREATH *UNUSUAL BRUISING OR BLEEDING *URINARY PROBLEMS (pain or burning when urinating, or frequent urination) *BOWEL PROBLEMS (unusual diarrhea, constipation, pain near the anus) TENDERNESS IN MOUTH AND THROAT WITH OR WITHOUT PRESENCE OF ULCERS (sore throat, sores in mouth, or a toothache) UNUSUAL RASH, SWELLING OR PAIN  UNUSUAL VAGINAL DISCHARGE OR ITCHING   Items with * indicate a potential emergency and should be followed up as soon as possible or go to the Emergency Department if any problems should occur.  Please show the CHEMOTHERAPY ALERT CARD or IMMUNOTHERAPY  ALERT CARD at check-in to the Emergency Department and triage nurse.  Should you have questions after your visit or need to cancel or reschedule your appointment, please contact [facility] - A DEPT OF [facility]  Dept: [phone]  and follow the prompts.  Office hours are 8:00 a.m. to 4:30 p.m. Monday - Friday. Please note that voicemails left after 4:00 p.m. may not be returned until the following business day.  We are closed weekends and major holidays. You have access to a nurse at all times for urgent questions. Please call the main number to the clinic Dept: [phone] and follow the prompts.   For any non-urgent questions, you may also contact your provider using MyChart. We now offer e-Visits for anyone 29 and older to request care online for non-urgent symptoms. For details visit [email or web address].   Also download the MyChart app! Go to the app store, search "MyChart", open the app, select , and log in with your MyChart username and password.

## 2024-03-20 ENCOUNTER — Ambulatory Visit
Admission: RE | Admit: 2024-03-20 | Discharge: 2024-03-20 | Disposition: A | Discharge status: Home / Self Care | Source: Ambulatory Visit | Attending: Radiation Oncology | Admitting: Radiation Oncology

## 2024-03-20 ENCOUNTER — Encounter: Payer: Self-pay | Admitting: Hematology and Oncology

## 2024-03-20 ENCOUNTER — Other Ambulatory Visit: Payer: Self-pay

## 2024-03-20 DIAGNOSIS — C50912 Malignant neoplasm of unspecified site of left female breast: Secondary | ICD-10-CM | POA: Diagnosis not present

## 2024-03-20 LAB — RAD ONC ARIA SESSION SUMMARY
Course Elapsed Days: 30
Plan Fractions Treated to Date: 11
Plan Fractions Treated to Date: 21
Plan Prescribed Dose Per Fraction: 1.8 Gy
Plan Prescribed Dose Per Fraction: 1.8 Gy
Plan Total Fractions Prescribed: 14
Plan Total Fractions Prescribed: 28
Plan Total Prescribed Dose: 25.2 Gy
Plan Total Prescribed Dose: 50.4 Gy
Reference Point Dosage Given to Date: 19.8 Gy
Reference Point Dosage Given to Date: 37.8 Gy
Reference Point Session Dosage Given: 1.8 Gy
Reference Point Session Dosage Given: 1.8 Gy
Session Number: 21

## 2024-03-21 ENCOUNTER — Ambulatory Visit

## 2024-03-22 ENCOUNTER — Ambulatory Visit
Admission: RE | Admit: 2024-03-22 | Discharge: 2024-03-22 | Disposition: A | Discharge status: Home / Self Care | Source: Ambulatory Visit | Attending: Radiation Oncology | Admitting: Radiation Oncology

## 2024-03-22 ENCOUNTER — Other Ambulatory Visit: Payer: Self-pay

## 2024-03-22 ENCOUNTER — Other Ambulatory Visit: Payer: Self-pay | Admitting: Hematology and Oncology

## 2024-03-22 ENCOUNTER — Ambulatory Visit

## 2024-03-22 ENCOUNTER — Ambulatory Visit: Admitting: Radiation Oncology

## 2024-03-22 ENCOUNTER — Inpatient Hospital Stay

## 2024-03-22 VITALS — BP 132/69 | HR 83 | Temp 98.3°F | Resp 18

## 2024-03-22 DIAGNOSIS — R112 Nausea with vomiting, unspecified: Secondary | ICD-10-CM

## 2024-03-22 DIAGNOSIS — R232 Flushing: Secondary | ICD-10-CM

## 2024-03-22 DIAGNOSIS — E860 Dehydration: Secondary | ICD-10-CM

## 2024-03-22 DIAGNOSIS — C50912 Malignant neoplasm of unspecified site of left female breast: Secondary | ICD-10-CM | POA: Diagnosis not present

## 2024-03-22 LAB — RAD ONC ARIA SESSION SUMMARY
Course Elapsed Days: 32
Plan Fractions Treated to Date: 11
Plan Fractions Treated to Date: 22
Plan Prescribed Dose Per Fraction: 1.8 Gy
Plan Prescribed Dose Per Fraction: 1.8 Gy
Plan Total Fractions Prescribed: 14
Plan Total Fractions Prescribed: 28
Plan Total Prescribed Dose: 25.2 Gy
Plan Total Prescribed Dose: 50.4 Gy
Reference Point Dosage Given to Date: 19.8 Gy
Reference Point Dosage Given to Date: 39.6 Gy
Reference Point Session Dosage Given: 1.8 Gy
Reference Point Session Dosage Given: 1.8 Gy
Session Number: 22

## 2024-03-22 MED ORDER — SODIUM CHLORIDE 0.9% FLUSH
10.0000 mL | Freq: Once | INTRAVENOUS | Status: AC
  Administered 2024-03-22: 10 mL

## 2024-03-22 MED ORDER — SODIUM CHLORIDE 0.9 % IV SOLN: INTRAVENOUS | Status: DC

## 2024-03-22 MED ORDER — HEPARIN SOD (PORK) LOCK FLUSH 100 UNIT/ML IV SOLN
500.0000 [IU] | Freq: Once | INTRAVENOUS | Status: AC
  Administered 2024-03-22: 500 [IU] via INTRAVENOUS

## 2024-03-22 NOTE — Patient Instructions (Signed)
 [facility] - A DEPT OF [facility]  Discharge Instructions: Thank you for choosing [facility] to provide your oncology and hematology care.   If you have a lab appointment with the Cancer Center, please go directly to the Cancer Center and check in at the registration area.  Wear comfortable clothing and clothing appropriate for easy access to any Portacath or PICC line.   We strive to give you quality time with your provider. You may need to reschedule your appointment if you arrive late (15 or more minutes).  Arriving late affects you and other patients whose appointments are after yours.  Also, if you miss three or more appointments without notifying the office, you may be dismissed from the clinic at the provider's discretion.      For prescription refill requests, have your pharmacy contact our office and allow 72 hours for refills to be completed.    IV fluids      To help prevent nausea and vomiting after your treatment, we encourage you to take your nausea medication as directed.  BELOW ARE SYMPTOMS THAT SHOULD BE REPORTED IMMEDIATELY: *FEVER GREATER THAN 100.4 F (38 C) OR HIGHER *CHILLS OR SWEATING *NAUSEA AND VOMITING THAT IS NOT CONTROLLED WITH YOUR NAUSEA MEDICATION *UNUSUAL SHORTNESS OF BREATH *UNUSUAL BRUISING OR BLEEDING *URINARY PROBLEMS (pain or burning when urinating, or frequent urination) *BOWEL PROBLEMS (unusual diarrhea, constipation, pain near the anus) TENDERNESS IN MOUTH AND THROAT WITH OR WITHOUT PRESENCE OF ULCERS (sore throat, sores in mouth, or a toothache) UNUSUAL RASH, SWELLING OR PAIN  UNUSUAL VAGINAL DISCHARGE OR ITCHING   Items with * indicate a potential emergency and should be followed up as soon as possible or go to the Emergency Department if any problems should occur.  Please show the CHEMOTHERAPY ALERT CARD or IMMUNOTHERAPY ALERT CARD at check-in to the Emergency Department and triage nurse. Should  you have questions after your visit or need to cancel or reschedule your appointment, please contact [facility] - A DEPT OF [facility]  [phone] and follow the prompts.  Office hours are 8:00 a.m. to 4:30 p.m. Monday - Friday. Please note that voicemails left after 4:00 p.m. may not be returned until the following business day.  We are closed weekends and major holidays. You have access to a nurse at all times for urgent questions. Please call the main number to the clinic [phone] and follow the prompts.  For any non-urgent questions, you may also contact your provider using MyChart. We now offer e-Visits for anyone 8 and older to request care online for non-urgent symptoms. For details visit [email or web address].   Also download the MyChart app! Go to the app store, search MyChart, open the app, select [facility], and log in with your MyChart username and password.

## 2024-03-22 NOTE — Progress Notes (Signed)
 Pt. arrived for IVF. No orders. Discussed with Dr. Maria Shiner. IVF order obtained for today. Denied any n/v today. Last episode of vomiting was about 2 weeks ago but has not been doing well with hydration. Education done. Encouraged oral intake. Juice given in clinic.

## 2024-03-25 ENCOUNTER — Ambulatory Visit
Admission: RE | Admit: 2024-03-25 | Discharge: 2024-03-25 | Disposition: A | Discharge status: Home / Self Care | Source: Ambulatory Visit | Attending: Radiation Oncology | Admitting: Radiation Oncology

## 2024-03-25 ENCOUNTER — Other Ambulatory Visit: Payer: Self-pay

## 2024-03-25 ENCOUNTER — Ambulatory Visit: Attending: General Surgery

## 2024-03-25 ENCOUNTER — Ambulatory Visit

## 2024-03-25 VITALS — Wt 177.0 lb

## 2024-03-25 DIAGNOSIS — C50912 Malignant neoplasm of unspecified site of left female breast: Secondary | ICD-10-CM | POA: Diagnosis not present

## 2024-03-25 DIAGNOSIS — Z483 Aftercare following surgery for neoplasm: Secondary | ICD-10-CM | POA: Insufficient documentation

## 2024-03-25 LAB — RAD ONC ARIA SESSION SUMMARY
Course Elapsed Days: 35
Plan Fractions Treated to Date: 12
Plan Fractions Treated to Date: 23
Plan Prescribed Dose Per Fraction: 1.8 Gy
Plan Prescribed Dose Per Fraction: 1.8 Gy
Plan Total Fractions Prescribed: 14
Plan Total Fractions Prescribed: 28
Plan Total Prescribed Dose: 25.2 Gy
Plan Total Prescribed Dose: 50.4 Gy
Reference Point Dosage Given to Date: 21.6 Gy
Reference Point Dosage Given to Date: 41.4 Gy
Reference Point Session Dosage Given: 1.8 Gy
Reference Point Session Dosage Given: 1.8 Gy
Session Number: 23

## 2024-03-25 NOTE — Therapy (Signed)
 OUTPATIENT PHYSICAL THERAPY SOZO SCREENING NOTE   Patient Name: Rebecca Steele MRN: [id number] DOB:[birth year], 73 y.o., female Today's Date: 03/25/2024  PCP: Abran Jon CROME, MD REFERRING PROVIDER: Abran Jon CROME, MD   PT End of Session - 03/25/24 1615     Visit Number 3   # unchanged due to screen only   PT Start Time 1613    PT Stop Time 1617    PT Time Calculation (min) 4 min    Activity Tolerance Patient tolerated treatment well    Behavior During Therapy [facility] for tasks assessed/performed          Past Medical History:  Diagnosis Date   Breast mass, right    Cancer (HCC) 10/2023   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 states she was given gabapentin  and fentanyl  with breast surgery and was diff to wake up   Depression    History of kidney stones    Hypothyroidism    Mitral valve prolapse    Nausea & vomiting 03/10/2024   Sleep apnea    HAS MILD OSA, NO CPAP NEEDED   Past Surgical History:  Procedure Laterality Date   APPENDECTOMY     BREAST BIOPSY Right 09/16/2022   MM RT BREAST BX W LOC DEV 1ST LESION IMAGE BX SPEC STEREO GUIDE 09/16/2022 [facility]   BREAST BIOPSY  12/06/2022   MM RT RADIOACTIVE SEED LOC MAMMO GUIDE 12/06/2022 [facility]   BREAST BIOPSY Left 11/16/2023   US  LT RADIOACTIVE SEED LOC 11/16/2023 [facility]   BREAST BIOPSY  11/16/2023   MM LT RADIOACTIVE SEED EA ADD LESION LOC MAMMO GUIDE 11/16/2023 [facility]   BREAST EXCISIONAL BIOPSY Right 2018   BREAST LUMPECTOMY WITH RADIOACTIVE SEED AND SENTINEL LYMPH NODE BIOPSY Left 11/20/2023   Procedure: LEFT BREAST SEED GUIDED LUMPECTOMY, LEFT AXILLARY SENTINEL NODE BIOPSY;  Surgeon: Ebbie Cough, MD;  Location: [facility];  Service: General;  Laterality: Left;  PEC BLOCK   CHOLECYSTECTOMY     CYSTOSCOPY W/ RETROGRADES     KYPHOPLASTY N/A 10/05/2021    Procedure: LUMBAR ONE KYPHOPLASTY;  Surgeon: Cheryle Debby LABOR, MD;  Location: [facility];  Service: Neurosurgery;  Laterality: N/A;   NECK SURGERY     PORTACATH PLACEMENT Right 10/12/2023   Procedure: INSERTION PORT-A-CATH WITH GUIDED ULTRASOUND;  Surgeon: Ebbie Cough, MD;  Location: [facility];  Service: General;  Laterality: Right;   RADIOACTIVE SEED GUIDED AXILLARY SENTINEL LYMPH NODE Left 11/20/2023   Procedure: LEFT AXILLARY NODE SEED GUIDED EXCISION;  Surgeon: Ebbie Cough, MD;  Location: [facility];  Service: General;  Laterality: Left;   RADIOACTIVE SEED GUIDED EXCISIONAL BREAST BIOPSY Right 12/12/2017   Procedure: RIGHT RADIOACTIVE SEED GUIDED EXCISIONAL BREAST BIOPSY ERAS PATHWAY;  Surgeon: Ebbie Cough, MD;  Location: [facility];  Service: General;  Laterality: Right;  LMA   RADIOACTIVE SEED GUIDED EXCISIONAL BREAST BIOPSY Right 12/07/2022   Procedure: RADIOACTIVE SEED GUIDED EXCISIONAL RIGHT BREAST BIOPSY;  Surgeon: Ebbie Cough, MD;  Location: [facility];  Service: General;  Laterality: Right;   SHOULDER SURGERY     SIMPLE MASTECTOMY WITH AXILLARY SENTINEL NODE BIOPSY Left 12/12/2023   Procedure: LEFT  MASTECTOMY;  Surgeon: Ebbie Cough, MD;  Location:  [facility];  Service: General;  Laterality: Left;   TONSILLECTOMY  TOTAL HIP ARTHROPLASTY Left 06/04/2021   Procedure: TOTAL HIP ARTHROPLASTY ANTERIOR APPROACH;  Surgeon: Yvone Rush, MD;  Location: [facility];  Service: Orthopedics;  Laterality: Left;   TRIGGER FINGER RELEASE Right 05/18/2015   Procedure: RIGHT  LONG FINGER TRIGGER RELEASE ;  Surgeon: Alm Hummer, MD;  Location: [facility];  Service: Orthopedics;  Laterality: Right;   Patient Active Problem List   Diagnosis Date Noted   Nausea & vomiting 03/10/2024   Chemotherapy induced nausea and vomiting 03/10/2024   S/P left mastectomy 12/12/2023   Breast  cancer, left breast (HCC) 11/20/2023   Port-A-Cath in place 10/18/2023   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 It measures at least 5mm and is located in the upper inner quadrant. It is ER/PR neg, HER2+ with 1 metastatic lymph node. Pt had  neoadjuvant chemotherapy TCHP regimen 1/17-1/20/2025. She had significant side effects to chemotheray and did not wish to proceed. She underwent left lumpectomy with SLNB on 11/20/2023 with macrometastases of 2 of 4 LN's. She underwent a left Mastectomy on 12/12/2023. Saw MD 01/03/2024 with left arm pit redness and possible drainage.Other hx: Cervical Fusion . She has radiation simulation on April 15 and starts chemo on the 15th for a year.  PRECAUTIONS: left UE Lymphedema risk, None  SUBJECTIVE: Pt returns for her 3 month L-Dex screen.   PAIN:  Are you having pain? No  SOZO SCREENING: Patient was assessed today using the SOZO machine to determine the lymphedema index score. This was compared to her baseline score. It was determined that she is within the recommended range when compared to her baseline and no further action is needed at this time. She will continue SOZO screenings. These are done every 3 months for 2 years post operatively followed by every 6 months for 2 years, and then annually.   L-DEX FLOWSHEETS - 03/25/24 1600        L-DEX LYMPHEDEMA SCREENING   Measurement Type Unilateral    L-DEX MEASUREMENT EXTREMITY Upper Extremity    POSITION  Standing    DOMINANT SIDE Right    At Risk Side Left    BASELINE SCORE (UNILATERAL) 3.8    L-DEX SCORE (UNILATERAL) 2.9    VALUE CHANGE (UNILAT) -0.9            Aden Berwyn Caldron, PTA 03/25/2024, 4:16 PM

## 2024-03-26 ENCOUNTER — Ambulatory Visit

## 2024-03-26 ENCOUNTER — Ambulatory Visit
Admission: RE | Admit: 2024-03-26 | Discharge: 2024-03-26 | Disposition: A | Discharge status: Home / Self Care | Source: Ambulatory Visit | Attending: Radiation Oncology | Admitting: Radiation Oncology

## 2024-03-26 ENCOUNTER — Other Ambulatory Visit: Payer: Self-pay

## 2024-03-26 DIAGNOSIS — C50912 Malignant neoplasm of unspecified site of left female breast: Secondary | ICD-10-CM | POA: Diagnosis not present

## 2024-03-26 LAB — RAD ONC ARIA SESSION SUMMARY
Course Elapsed Days: 36
Plan Fractions Treated to Date: 12
Plan Fractions Treated to Date: 24
Plan Prescribed Dose Per Fraction: 1.8 Gy
Plan Prescribed Dose Per Fraction: 1.8 Gy
Plan Total Fractions Prescribed: 14
Plan Total Fractions Prescribed: 28
Plan Total Prescribed Dose: 25.2 Gy
Plan Total Prescribed Dose: 50.4 Gy
Reference Point Dosage Given to Date: 21.6 Gy
Reference Point Dosage Given to Date: 43.2 Gy
Reference Point Session Dosage Given: 1.8 Gy
Reference Point Session Dosage Given: 1.8 Gy
Session Number: 24

## 2024-03-27 ENCOUNTER — Ambulatory Visit
Admission: RE | Admit: 2024-03-27 | Discharge: 2024-03-27 | Disposition: A | Discharge status: Home / Self Care | Source: Ambulatory Visit | Attending: Radiation Oncology | Admitting: Radiation Oncology

## 2024-03-27 ENCOUNTER — Encounter: Payer: Self-pay | Admitting: Hematology and Oncology

## 2024-03-27 ENCOUNTER — Other Ambulatory Visit: Payer: Self-pay

## 2024-03-27 ENCOUNTER — Ambulatory Visit

## 2024-03-27 DIAGNOSIS — C50912 Malignant neoplasm of unspecified site of left female breast: Secondary | ICD-10-CM | POA: Diagnosis not present

## 2024-03-27 LAB — RAD ONC ARIA SESSION SUMMARY
Course Elapsed Days: 37
Plan Fractions Treated to Date: 13
Plan Fractions Treated to Date: 25
Plan Prescribed Dose Per Fraction: 1.8 Gy
Plan Prescribed Dose Per Fraction: 1.8 Gy
Plan Total Fractions Prescribed: 14
Plan Total Fractions Prescribed: 28
Plan Total Prescribed Dose: 25.2 Gy
Plan Total Prescribed Dose: 50.4 Gy
Reference Point Dosage Given to Date: 23.4 Gy
Reference Point Dosage Given to Date: 45 Gy
Reference Point Session Dosage Given: 1.8 Gy
Reference Point Session Dosage Given: 1.8 Gy
Session Number: 25

## 2024-03-28 ENCOUNTER — Inpatient Hospital Stay

## 2024-03-28 ENCOUNTER — Other Ambulatory Visit: Payer: Self-pay

## 2024-03-28 ENCOUNTER — Encounter: Payer: Self-pay | Admitting: *Deleted

## 2024-03-28 ENCOUNTER — Ambulatory Visit
Admission: RE | Admit: 2024-03-28 | Discharge: 2024-03-28 | Disposition: A | Discharge status: Home / Self Care | Source: Ambulatory Visit | Attending: Hematology and Oncology | Admitting: Hematology and Oncology

## 2024-03-28 ENCOUNTER — Ambulatory Visit
Admission: RE | Admit: 2024-03-28 | Discharge: 2024-03-28 | Disposition: A | Discharge status: Home / Self Care | Source: Ambulatory Visit | Attending: Radiation Oncology | Admitting: Radiation Oncology

## 2024-03-28 ENCOUNTER — Ambulatory Visit

## 2024-03-28 ENCOUNTER — Encounter: Payer: Self-pay | Admitting: Hematology and Oncology

## 2024-03-28 ENCOUNTER — Telehealth: Payer: Self-pay

## 2024-03-28 ENCOUNTER — Other Ambulatory Visit: Payer: Self-pay | Admitting: Hematology and Oncology

## 2024-03-28 VITALS — BP 160/91 | HR 86 | Temp 97.5°F | Resp 18 | Ht 65.0 in | Wt 173.7 lb

## 2024-03-28 DIAGNOSIS — Z171 Estrogen receptor negative status [ER-]: Secondary | ICD-10-CM

## 2024-03-28 DIAGNOSIS — Z95828 Presence of other vascular implants and grafts: Secondary | ICD-10-CM

## 2024-03-28 DIAGNOSIS — R112 Nausea with vomiting, unspecified: Secondary | ICD-10-CM

## 2024-03-28 DIAGNOSIS — C50912 Malignant neoplasm of unspecified site of left female breast: Secondary | ICD-10-CM | POA: Diagnosis not present

## 2024-03-28 DIAGNOSIS — N6310 Unspecified lump in the right breast, unspecified quadrant: Secondary | ICD-10-CM

## 2024-03-28 LAB — CMP (CANCER CENTER ONLY)
ALT: 29 U/L (ref 0–44)
AST: 45 U/L — ABNORMAL HIGH (ref 15–41)
Albumin: 4.3 g/dL (ref 3.5–5.0)
Alkaline Phosphatase: 93 U/L (ref 38–126)
Anion gap: 11 (ref 5–15)
BUN: 8 mg/dL (ref 8–23)
CO2: 26 mmol/L (ref 22–32)
Calcium: 10.4 mg/dL — ABNORMAL HIGH (ref 8.9–10.3)
Chloride: 102 mmol/L (ref 98–111)
Creatinine: 0.97 mg/dL (ref 0.44–1.00)
GFR, Estimated: >60 mL/min (ref >60)
Glucose, Bld: 101 mg/dL — ABNORMAL HIGH (ref 70–99)
Potassium: 3 mmol/L — ABNORMAL LOW (ref 3.5–5.1)
Sodium: 139 mmol/L (ref 135–145)
Total Bilirubin: 1.9 mg/dL — ABNORMAL HIGH (ref 0.0–1.2)
Total Protein: 7.1 g/dL (ref 6.5–8.1)

## 2024-03-28 LAB — RAD ONC ARIA SESSION SUMMARY
Course Elapsed Days: 38
Plan Fractions Treated to Date: 13
Plan Fractions Treated to Date: 26
Plan Prescribed Dose Per Fraction: 1.8 Gy
Plan Prescribed Dose Per Fraction: 1.8 Gy
Plan Total Fractions Prescribed: 14
Plan Total Fractions Prescribed: 28
Plan Total Prescribed Dose: 25.2 Gy
Plan Total Prescribed Dose: 50.4 Gy
Reference Point Dosage Given to Date: 23.4 Gy
Reference Point Dosage Given to Date: 46.8 Gy
Reference Point Session Dosage Given: 1.8 Gy
Reference Point Session Dosage Given: 1.8 Gy
Session Number: 26

## 2024-03-28 LAB — CBC WITH DIFFERENTIAL (CANCER CENTER ONLY)
Abs Immature Granulocytes: 0.01 10*3/uL (ref 0.00–0.07)
Basophils Absolute: 0.1 10*3/uL (ref 0.0–0.1)
Basophils Relative: 1 %
Eosinophils Absolute: 0.1 10*3/uL (ref 0.0–0.5)
Eosinophils Relative: 1 %
HCT: 42.8 % (ref 36.0–46.0)
Hemoglobin: 15.1 g/dL — ABNORMAL HIGH (ref 12.0–15.0)
Immature Granulocytes: 0 %
Lymphocytes Relative: 11 %
Lymphs Abs: 0.7 10*3/uL (ref 0.7–4.0)
MCH: 30.1 pg (ref 26.0–34.0)
MCHC: 35.3 g/dL (ref 30.0–36.0)
MCV: 85.4 fL (ref 80.0–100.0)
Monocytes Absolute: 1 10*3/uL (ref 0.1–1.0)
Monocytes Relative: 15 %
Neutro Abs: 4.6 10*3/uL (ref 1.7–7.7)
Neutrophils Relative %: 72 %
Platelet Count: 157 10*3/uL (ref 150–400)
RBC: 5.01 MIL/uL (ref 3.87–5.11)
RDW: 14.5 % (ref 11.5–15.5)
WBC Count: 6.4 10*3/uL (ref 4.0–10.5)
nRBC: 0 % (ref 0.0–0.2)

## 2024-03-28 LAB — MAGNESIUM: Magnesium: 1.9 mg/dL (ref 1.7–2.4)

## 2024-03-28 MED ORDER — SODIUM CHLORIDE 0.9% FLUSH
10.0000 mL | Freq: Once | INTRAVENOUS | Status: AC
  Administered 2024-03-28: 10 mL

## 2024-03-28 MED ORDER — DEXAMETHASONE SODIUM PHOSPHATE 10 MG/ML IJ SOLN
4.0000 mg | Freq: Once | INTRAMUSCULAR | Status: AC
  Administered 2024-03-28: 4 mg via INTRAVENOUS
  Filled 2024-03-28: qty 1

## 2024-03-28 MED ORDER — HEPARIN SOD (PORK) LOCK FLUSH 100 UNIT/ML IV SOLN
500.0000 [IU] | Freq: Once | INTRAVENOUS | Status: AC
  Administered 2024-03-28: 500 [IU]

## 2024-03-28 MED ORDER — SODIUM CHLORIDE 0.9 % IV SOLN: Freq: Once | INTRAVENOUS | Status: AC

## 2024-03-28 NOTE — Patient Instructions (Signed)
Nausea, Adult Nausea is feeling like you may vomit. Feeling like you may vomit is usually not serious, but it may be an early sign of a more serious medical problem. Vomiting is when stomach contents forcefully come out of your mouth. If you vomit, or if you are not able to drink enough fluids, you may not have enough water in your body (get dehydrated). If you do not have enough water in your body, you may: Feel tired. Feel thirsty. Have a dry mouth. Have cracked lips. Pee (urinate) less often. Older adults and people who have other diseases or a weak body defense system (immune system) have a higher risk of not having enough water in the body. The main goals of treating this condition are: To relieve your nausea. To ensure your nausea occurs less often. To prevent vomiting and losing too much fluid. Follow these instructions at home: Watch your symptoms for any changes. Tell your doctor about them. Eating and drinking     Take an ORS (oral rehydration solution). This is a drink that is sold at pharmacies and stores. Drink clear fluids in small amounts as you are able. These include: Water. Ice chips. Fruit juice that has water added (diluted fruit juice). Low-calorie sports drinks. Eat bland, easy-to-digest foods in small amounts as you are able, such as: Bananas. Applesauce. Rice. Low-fat (lean) meats. Toast. Crackers. Avoid drinking fluids that have a lot of sugar or caffeine in them. This includes energy drinks, sports drinks, and soda. Avoid alcohol. Avoid spicy or fatty foods. General instructions Take over-the-counter and prescription medicines only as told by your doctor. Rest at home while you get better. Drink enough fluid to keep your pee (urine) pale yellow. Take slow and deep breaths when you feel like you may vomit. Avoid food or things that have strong smells. Wash your hands often with soap and water for at least 20 seconds. If you cannot use soap and water,  use hand sanitizer. Make sure that everyone in your home washes their hands well and often. Keep all follow-up visits. Contact a doctor if: You feel worse. You feel like you may vomit and this lasts for more than 2 days. You vomit. You are not able to drink fluids without vomiting. You have new symptoms. You have a fever. You have a headache. You have muscle cramps. You have a rash. You have pain while peeing. You feel light-headed or dizzy. Get help right away if: You have pain in your chest, neck, arm, or jaw. You feel very weak or you faint. You have vomit that is bright red or looks like coffee grounds. You have bloody or black poop (stools) or poop that looks like tar. You have a very bad headache, a stiff neck, or both. You have very bad pain, cramping, or bloating in your belly (abdomen). You have trouble breathing or you are breathing very quickly. Your heart is beating very quickly. Your skin feels cold and clammy. You feel confused. You have signs of losing too much water in your body, such as: Dark pee, very little pee, or no pee. Cracked lips. Dry mouth. Sunken eyes. Sleepiness. Weakness. These symptoms may be an emergency. Get help right away. Call 911. Do not wait to see if the symptoms will go away. Do not drive yourself to the hospital. Summary Nausea is feeling like you are about vomit. If you vomit, or if you are not able to drink enough fluids, you may not have enough water in   your body (get dehydrated). Eat and drink what your doctor tells you. Take over-the-counter and prescription medicines only as told by your doctor. Contact a doctor right away if your symptoms get worse or you have new symptoms. Keep all follow-up visits. This information is not intended to replace advice given to you by your health care provider. Make sure you discuss any questions you have with your health care provider. Document Revised: 03/26/2021 Document Reviewed:  03/26/2021 Elsevier Patient Education  2024 [employer]  

## 2024-03-28 NOTE — Telephone Encounter (Signed)
 Spoke with patient regarding recent myChart message. Patient reports experiencing significant n/v. Patient has been utilizing her antiemetics as prescribed with little relief. She has not been able to keep any food down and has only been able to drink some ginger ale. Dr. Loretha aware of the situation.  Patient agreeable to coming in for IVF and labs. Patient scheduled with [facility] clinic and verbal orders entered.

## 2024-03-28 NOTE — Progress Notes (Signed)
 Per Mallie RIGGERS OK to infuse IVFs at a rate of 999 mL/hr today.

## 2024-03-29 ENCOUNTER — Encounter: Payer: Self-pay | Admitting: Hematology and Oncology

## 2024-03-29 ENCOUNTER — Inpatient Hospital Stay: Admitting: Hematology and Oncology

## 2024-03-29 ENCOUNTER — Other Ambulatory Visit: Payer: Self-pay | Admitting: *Deleted

## 2024-03-29 ENCOUNTER — Ambulatory Visit

## 2024-03-29 DIAGNOSIS — Z171 Estrogen receptor negative status [ER-]: Secondary | ICD-10-CM

## 2024-03-29 DIAGNOSIS — R112 Nausea with vomiting, unspecified: Secondary | ICD-10-CM | POA: Diagnosis not present

## 2024-03-29 DIAGNOSIS — C50212 Malignant neoplasm of upper-inner quadrant of left female breast: Secondary | ICD-10-CM | POA: Diagnosis not present

## 2024-03-29 DIAGNOSIS — E876 Hypokalemia: Secondary | ICD-10-CM | POA: Diagnosis not present

## 2024-03-29 DIAGNOSIS — C50912 Malignant neoplasm of unspecified site of left female breast: Secondary | ICD-10-CM | POA: Diagnosis not present

## 2024-03-29 MED ORDER — POTASSIUM CHLORIDE CRYS ER 10 MEQ PO TBCR: 10.0000 meq | EXTENDED_RELEASE_TABLET | Freq: Two times a day (BID) | ORAL | 0 refills | Status: DC

## 2024-03-29 MED ORDER — OLANZAPINE 2.5 MG PO TABS: 2.5000 mg | ORAL_TABLET | Freq: Every day | ORAL | 3 refills | Status: DC

## 2024-03-29 MED ORDER — METOCLOPRAMIDE HCL 10 MG PO TABS: 10.0000 mg | ORAL_TABLET | Freq: Four times a day (QID) | ORAL | 0 refills | Status: DC | PRN

## 2024-03-29 MED ORDER — OLANZAPINE 5 MG PO TABS: 2.5000 mg | ORAL_TABLET | Freq: Every day | ORAL | Status: DC

## 2024-03-29 NOTE — Progress Notes (Signed)
 [facility] CONSULT NOTE  Patient Care Team: Abran Jon CROME, MD as PCP - General (Internal Medicine) Ebbie Cough, MD as Consulting Physician (General Surgery) Curlene Agent, MD as Consulting Physician (Obstetrics and Gynecology) Dyane Rush, MD (Inactive) as Consulting Physician (Gastroenterology) Loretha Ash, MD as Consulting Physician (Hematology and Oncology) Glean Stephane BROCKS, RN (Inactive) as Oncology Nurse Navigator Tyree Nanetta SAILOR, RN as Oncology Nurse Navigator  CHIEF COMPLAINTS/PURPOSE OF CONSULTATION:  Breast cancer follow up.  HISTORY OF PRESENTING ILLNESS:   Rebecca Steele 73 y.o. female is here because of recent diagnosis of left breast cancer  I reviewed her records extensively and collaborated the history with the patient.  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)  09/22/2023 Mammogram   Patient self palpated a breast mass in her left breast around November 2019 went for diagnostic mammogram, this showed there is a developing density on mammogram, 2 of the lower left axillary lymph nodes exhibit increased cortical thickness, ultrasound confirmed a 2.7 cm irregular hypoechoic mass in left breast at 12:00 5 cm from the nipple along with abnormal left axillary lymph   09/29/2023 Pathology Results   Left breast central superior needle core biopsy 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)   10/20/2023 - 10/23/2023 Chemotherapy   Patient is on Treatment Plan : BREAST  Docetaxel  + Carboplatin  + Trastuzumab  + Pertuzumab   (TCHP) q21d      11/24/2023 Cancer Staging   Staging form: Breast, AJCC 8th  Edition - Pathologic stage from 11/24/2023: ypT2, pN1, cM0, G3, ER-, PR-, HER2+ - Signed by Loretha Ash, MD on 11/24/2023 Stage prefix: Post-therapy Histologic grading system: 3 grade system   01/16/2024 -  Chemotherapy   Patient is on Treatment Plan : BREAST ADO-Trastuzumab Emtansine  (Kadcyla ) q21d       She had left breast lumpectomy which showed Invasive poorly differentiated adenocarcinoma, grade 3, focal high grade DCIS, solid type without necrosis. Tumor measures 3.6 cms in greatest dimension.Invasive tumor in lateral and posterior margins. Pos lateral margin, 2/4 LN with macromets, one with ITC. She had re-excision on 12/12/2023.  History of Present Illness  Rebecca Steele is a 73 year old female with a history of cancer treatment who presents with persistent nausea.  She experiences significant nausea that has persisted and affected her ability to eat. Despite taking Zofran  twice daily, Reglan  twice daily, and Protonix  daily, the nausea remains poorly controlled and impacts her daily life. She maintains hydration with fluids, including ginger ale and sparkling water , and has been receiving fluids and electrolytes to manage her condition.  She has a history of cancer treatment and is currently undergoing treatment with Kadcyla . Previously, she was treated with Herceptin , which she tolerated well. She reports a recent mammogram and ultrasound indicating a concern in the right breast, with a biopsy scheduled for Monday. A previous diagnostic mammogram in January did not show any masses in the right breast.  Her potassium levels have been noted to be low, and she is currently prescribed 10 mEq of potassium, taking two tablets a day. She maintains hydration with fluids, including ginger ale and sparkling water , to help manage her condition.  She experiences aversion to certain smells, similar to when she was pregnant. No dehydration is reported.  Rest of the pertinent 10 point ROS  reviewed and neg.  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 states she was given gabapentin  and fentanyl  with breast surgery and was diff to wake up   Depression    History of kidney stones    Hypothyroidism    Mitral valve prolapse    Nausea & vomiting 03/10/2024   Sleep apnea    HAS MILD OSA, NO CPAP NEEDED    SURGICAL HISTORY: Past Surgical History:  Procedure Laterality Date   APPENDECTOMY     BREAST BIOPSY Right 09/16/2022   MM RT BREAST BX W LOC DEV 1ST LESION IMAGE BX SPEC STEREO GUIDE 09/16/2022 [facility]   BREAST BIOPSY  12/06/2022   MM RT RADIOACTIVE SEED LOC MAMMO GUIDE 12/06/2022 [facility]   BREAST BIOPSY Left 11/16/2023   US  LT RADIOACTIVE SEED LOC 11/16/2023 [facility]   BREAST BIOPSY  11/16/2023   MM LT RADIOACTIVE SEED EA ADD LESION LOC MAMMO GUIDE 11/16/2023 [facility]   BREAST EXCISIONAL BIOPSY Right 2018   BREAST LUMPECTOMY WITH RADIOACTIVE SEED AND SENTINEL LYMPH NODE BIOPSY Left 11/20/2023   Procedure: LEFT BREAST SEED GUIDED LUMPECTOMY, LEFT AXILLARY SENTINEL NODE BIOPSY;  Surgeon: Ebbie Cough, MD;  Location: [facility];  Service: General;  Laterality: Left;  PEC BLOCK   CHOLECYSTECTOMY     CYSTOSCOPY W/ RETROGRADES     KYPHOPLASTY N/A 10/05/2021   Procedure: LUMBAR ONE KYPHOPLASTY;  Surgeon: Cheryle Debby LABOR, MD;  Location: [facility];  Service: Neurosurgery;  Laterality: N/A;   NECK SURGERY     PORTACATH PLACEMENT Right 10/12/2023   Procedure: INSERTION PORT-A-CATH WITH GUIDED ULTRASOUND;  Surgeon: Ebbie Cough, MD;  Location: [facility];  Service: General;  Laterality: Right;   RADIOACTIVE SEED GUIDED AXILLARY SENTINEL LYMPH NODE Left 11/20/2023   Procedure: LEFT AXILLARY NODE SEED  GUIDED EXCISION;  Surgeon: Ebbie Cough, MD;  Location: [facility];  Service: General;  Laterality: Left;   RADIOACTIVE SEED GUIDED EXCISIONAL BREAST BIOPSY Right 12/12/2017   Procedure: RIGHT RADIOACTIVE SEED GUIDED EXCISIONAL BREAST BIOPSY ERAS PATHWAY;  Surgeon: Ebbie Cough, MD;  Location: [facility];  Service: General;  Laterality: Right;  LMA   RADIOACTIVE SEED GUIDED EXCISIONAL BREAST BIOPSY Right 12/07/2022   Procedure: RADIOACTIVE SEED GUIDED EXCISIONAL RIGHT BREAST BIOPSY;  Surgeon: Ebbie Cough, MD;  Location: [facility];  Service: General;  Laterality: Right;   SHOULDER SURGERY     SIMPLE MASTECTOMY WITH AXILLARY SENTINEL NODE BIOPSY Left 12/12/2023   Procedure: LEFT  MASTECTOMY;  Surgeon: Ebbie Cough, MD;  Location: [facility];  Service: General;  Laterality: Left;   TONSILLECTOMY     TOTAL HIP ARTHROPLASTY Left 06/04/2021   Procedure: TOTAL HIP ARTHROPLASTY ANTERIOR APPROACH;  Surgeon: Yvone Rush, MD;  Location: [facility];  Service: Orthopedics;  Laterality: Left;   TRIGGER FINGER RELEASE Right 05/18/2015   Procedure: RIGHT  LONG FINGER TRIGGER RELEASE ;  Surgeon: Alm Hummer, MD;  Location:  SURGERY CENTER;  Service: Orthopedics;  Laterality: Right;    SOCIAL HISTORY: Social History   Socioeconomic History  Marital status: Married    Spouse name: Not on file   Number of children: Not on file   Years of education: Not on file   Highest education level: Not on file  Occupational History   Not on file  Tobacco Use   Smoking status: Never   Smokeless tobacco: Never  Vaping Use   Vaping status: Never Used  Substance and Sexual Activity   Alcohol  use: No   Drug use: No   Sexual activity: Not Currently    Birth control/protection: Post-menopausal  Other Topics Concern   Not on file  Social History Narrative   Not on file   Social Drivers of Health   Financial Resource  Strain: Low Risk  (01/01/2024)   Received from [facility]   Overall Financial Resource Strain (CARDIA)    Difficulty of Paying Living Expenses: Not hard at all  Food Insecurity: No Food Insecurity (03/10/2024)   Hunger Vital Sign    Worried About Running Out of Food in the Last Year: Never true    Ran Out of Food in the Last Year: Never true  Transportation Needs: No Transportation Needs (03/10/2024)   PRAPARE - Administrator, Civil Service (Medical): No    Lack of Transportation (Non-Medical): No  Physical Activity: Inactive (04/24/2023)   Received [facility]   Exercise Vital Sign    On average, how many days per week do you engage in moderate to strenuous exercise (like a brisk walk)?: 0 days    On average, how many minutes do you engage in exercise at this level?: 10 min  Stress: No Stress Concern Present (04/24/2023)   Received from [facility] - Occupational Stress Questionnaire    Feeling of Stress : Not at all  Social Connections: Socially Integrated (03/10/2024)   Social Connection and Isolation Panel    Frequency of Communication with Friends and Family: More than three times a week    Frequency of Social Gatherings with Friends and Family: More than three times a week    Attends Religious Services: More than 4 times per year    Active Member of [employer] or Organizations: Yes    Attends Banker Meetings: More than 4 times per year    Marital Status: Married  Catering manager Violence: Not At Risk (03/10/2024)   Humiliation, Afraid, Rape, and Kick questionnaire    Fear of Current or Ex-Partner: No    Emotionally Abused: No    Physically Abused: No    Sexually Abused: No    FAMILY HISTORY: Family History  Problem Relation Age of Onset   Breast cancer Maternal Aunt        40s    ALLERGIES:  is allergic to gadolinium derivatives, other, valium, paxlovid [nirmatrelvir-ritonavir], doxycycline , fentanyl ,  iohexol , macrodantin, red dye #40 (allura red), rocephin [ceftriaxone], and zithromax [azithromycin].  MEDICATIONS:  Current Outpatient Medications  Medication Sig Dispense Refill   butalbital -acetaminophen -caffeine  (FIORICET) 50-325-40 MG tablet Take 1 tablet by mouth 2 (two) times daily as needed for headache or migraine.     doxycycline  (VIBRA -TABS) 100 MG tablet Take 1 tablet (100 mg total) by mouth 2 (two) times daily. 14 tablet 0   levothyroxine  (SYNTHROID ) 75 MCG tablet Take 75 mcg by mouth daily before breakfast.     lidocaine -prilocaine  (EMLA ) cream Apply to affected area once 30 g 3   loratadine  (CLARITIN ) 10 MG tablet Take 10 mg by mouth daily.  metoCLOPramide  (REGLAN ) 10 MG tablet Take 1 tablet (10 mg total) by mouth every 6 (six) hours as needed for nausea (nausea/headache). 10 tablet 0   montelukast  (SINGULAIR ) 10 MG tablet Take 10 mg by mouth at bedtime.     ondansetron  (ZOFRAN -ODT) 8 MG disintegrating tablet Take 1 tablet (8 mg total) by mouth every 8 (eight) hours as needed for nausea or vomiting. 30 tablet 1   pantoprazole  (PROTONIX ) 40 MG tablet Take 1 tablet (40 mg total) by mouth daily. 30 tablet 2   potassium chloride  (KLOR-CON  M) 10 MEQ tablet Take 1 tablet (10 mEq total) by mouth 2 (two) times daily. 30 tablet 0   prochlorperazine  (COMPAZINE ) 10 MG tablet Take 1 tablet (10 mg total) by mouth every 6 (six) hours as needed for nausea or vomiting. 30 tablet 1   sertraline  (ZOLOFT ) 50 MG tablet Take 50 mg by mouth daily.     sucralfate  (CARAFATE ) 1 GM/10ML suspension Take 10 mLs (1 g total) by mouth 4 (four) times daily -  with meals and at bedtime. 414 mL 0   traZODone  (DESYREL ) 100 MG tablet Take 100 mg by mouth at bedtime.     No current facility-administered medications for this visit.    REVIEW OF SYSTEMS:   Constitutional: Denies fevers, chills or abnormal night sweats Eyes: Denies blurriness of vision, double vision or watery eyes Ears, nose, mouth, throat, and  face: Denies mucositis or sore throat Respiratory: Denies cough, dyspnea or wheezes Cardiovascular: Denies palpitation, chest discomfort or lower extremity swelling Gastrointestinal:  Denies nausea, heartburn or change in bowel habits Skin: Denies abnormal skin rashes Lymphatics: Denies new lymphadenopathy or easy bruising Neurological:Denies numbness, tingling or new weaknesses Behavioral/Psych: Mood is stable, no new changes  Breast: As mentioned above All other systems were reviewed with the patient and are negative.  PHYSICAL EXAMINATION: ECOG PERFORMANCE STATUS: 0 - Asymptomatic  There were no vitals taken for this visit.   GENERAL appears well. No acute distress.   LABORATORY DATA:  I have reviewed the data as listed Lab Results  Component Value Date   WBC 6.4 03/28/2024   HGB 15.1 (H) 03/28/2024   HCT 42.8 03/28/2024   MCV 85.4 03/28/2024   PLT 157 03/28/2024   Lab Results  Component Value Date   NA 139 03/28/2024   K 3.0 (L) 03/28/2024   CL 102 03/28/2024   CO2 26 03/28/2024    RADIOGRAPHIC STUDIES: I have personally reviewed the radiological reports and agreed with the findings in the report.  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   Breast cancer Kadcyla  causing significant nausea, impacting quality of life. Discussed dose reduction or switch to Herceptin . She prefers dose reduction first. - Reassess on July 8th before her next planned cycle of kadycla - Switch to Herceptin  if symptoms persist.  Nausea Persistent nausea likely due to Kadcyla . Current regimen suboptimal. - Increase Zofran  to every 8 hours. - Increase Reglan  to every 6 hours as needed. - Add Zyprexa at night. - Continue Protonix  daily. - Encourage ginger ale and sparkling water   for hydration. Pt can't tolerate plain water .  Breast cancer Recent imaging shows potentialmass in right breast. Biopsy scheduled. - Proceed with biopsy on Monday. - Review biopsy results and plan accordingly.  Hypokalemia Slightly low potassium  levels, not requiring IV supplementation. - Increase oral potassium to 20 mEq twice daily for 3 days, then return to regular dose. - Send prescription for additional potassium to [facility].   Total time spent: 40 min including history, exam, discussion of options, review of records, counseling and coordination of care  All questions were answered. The patient knows to call the clinic with any problems, questions or concerns.    Amber Stalls, MD 03/29/24

## 2024-04-01 ENCOUNTER — Ambulatory Visit

## 2024-04-01 ENCOUNTER — Other Ambulatory Visit: Payer: Self-pay

## 2024-04-01 ENCOUNTER — Encounter: Payer: Self-pay | Admitting: *Deleted

## 2024-04-01 ENCOUNTER — Inpatient Hospital Stay (HOSPITAL_COMMUNITY)
Admission: EM | Admit: 2024-04-01 | Discharge: 2024-04-07 | DRG: 392 | Disposition: A | Discharge status: Home / Self Care | Source: Ambulatory Visit | Attending: Family Medicine | Admitting: Family Medicine

## 2024-04-01 ENCOUNTER — Ambulatory Visit
Admission: RE | Admit: 2024-04-01 | Discharge: 2024-04-01 | Disposition: A | Discharge status: Home / Self Care | Source: Ambulatory Visit | Attending: Hematology and Oncology

## 2024-04-01 ENCOUNTER — Emergency Department (HOSPITAL_COMMUNITY)

## 2024-04-01 ENCOUNTER — Telehealth: Payer: Self-pay | Admitting: *Deleted

## 2024-04-01 ENCOUNTER — Other Ambulatory Visit (HOSPITAL_COMMUNITY): Payer: Self-pay | Admitting: Diagnostic Radiology

## 2024-04-01 DIAGNOSIS — E860 Dehydration: Secondary | ICD-10-CM | POA: Diagnosis present

## 2024-04-01 DIAGNOSIS — C50212 Malignant neoplasm of upper-inner quadrant of left female breast: Secondary | ICD-10-CM | POA: Diagnosis present

## 2024-04-01 DIAGNOSIS — J309 Allergic rhinitis, unspecified: Secondary | ICD-10-CM | POA: Diagnosis present

## 2024-04-01 DIAGNOSIS — Y842 Radiological procedure and radiotherapy as the cause of abnormal reaction of the patient, or of later complication, without mention of misadventure at the time of the procedure: Secondary | ICD-10-CM | POA: Diagnosis present

## 2024-04-01 DIAGNOSIS — L598 Other specified disorders of the skin and subcutaneous tissue related to radiation: Secondary | ICD-10-CM | POA: Diagnosis present

## 2024-04-01 DIAGNOSIS — J301 Allergic rhinitis due to pollen: Secondary | ICD-10-CM | POA: Diagnosis not present

## 2024-04-01 DIAGNOSIS — Z79890 Hormone replacement therapy (postmenopausal): Secondary | ICD-10-CM

## 2024-04-01 DIAGNOSIS — E039 Hypothyroidism, unspecified: Secondary | ICD-10-CM | POA: Diagnosis not present

## 2024-04-01 DIAGNOSIS — N6310 Unspecified lump in the right breast, unspecified quadrant: Secondary | ICD-10-CM

## 2024-04-01 DIAGNOSIS — I341 Nonrheumatic mitral (valve) prolapse: Secondary | ICD-10-CM | POA: Diagnosis present

## 2024-04-01 DIAGNOSIS — F419 Anxiety disorder, unspecified: Secondary | ICD-10-CM | POA: Diagnosis present

## 2024-04-01 DIAGNOSIS — Z9012 Acquired absence of left breast and nipple: Secondary | ICD-10-CM

## 2024-04-01 DIAGNOSIS — Z881 Allergy status to other antibiotic agents status: Secondary | ICD-10-CM

## 2024-04-01 DIAGNOSIS — E872 Acidosis, unspecified: Secondary | ICD-10-CM | POA: Diagnosis present

## 2024-04-01 DIAGNOSIS — E876 Hypokalemia: Secondary | ICD-10-CM | POA: Diagnosis not present

## 2024-04-01 DIAGNOSIS — Z91041 Radiographic dye allergy status: Secondary | ICD-10-CM

## 2024-04-01 DIAGNOSIS — R112 Nausea with vomiting, unspecified: Principal | ICD-10-CM | POA: Diagnosis present

## 2024-04-01 DIAGNOSIS — E8729 Other acidosis: Secondary | ICD-10-CM | POA: Diagnosis present

## 2024-04-01 DIAGNOSIS — R110 Nausea: Principal | ICD-10-CM

## 2024-04-01 DIAGNOSIS — R1013 Epigastric pain: Secondary | ICD-10-CM | POA: Diagnosis present

## 2024-04-01 DIAGNOSIS — R17 Unspecified jaundice: Secondary | ICD-10-CM | POA: Diagnosis present

## 2024-04-01 DIAGNOSIS — Z9221 Personal history of antineoplastic chemotherapy: Secondary | ICD-10-CM

## 2024-04-01 DIAGNOSIS — Z96642 Presence of left artificial hip joint: Secondary | ICD-10-CM | POA: Diagnosis present

## 2024-04-01 DIAGNOSIS — Z923 Personal history of irradiation: Secondary | ICD-10-CM

## 2024-04-01 DIAGNOSIS — Z888 Allergy status to other drugs, medicaments and biological substances status: Secondary | ICD-10-CM

## 2024-04-01 DIAGNOSIS — Z79899 Other long term (current) drug therapy: Secondary | ICD-10-CM

## 2024-04-01 DIAGNOSIS — Z803 Family history of malignant neoplasm of breast: Secondary | ICD-10-CM

## 2024-04-01 DIAGNOSIS — C50219 Malignant neoplasm of upper-inner quadrant of unspecified female breast: Secondary | ICD-10-CM | POA: Diagnosis present

## 2024-04-01 DIAGNOSIS — Z885 Allergy status to narcotic agent status: Secondary | ICD-10-CM

## 2024-04-01 DIAGNOSIS — Z171 Estrogen receptor negative status [ER-]: Secondary | ICD-10-CM

## 2024-04-01 DIAGNOSIS — C50211 Malignant neoplasm of upper-inner quadrant of right female breast: Secondary | ICD-10-CM | POA: Diagnosis present

## 2024-04-01 DIAGNOSIS — I5032 Chronic diastolic (congestive) heart failure: Secondary | ICD-10-CM | POA: Diagnosis present

## 2024-04-01 DIAGNOSIS — F32A Depression, unspecified: Secondary | ICD-10-CM | POA: Diagnosis present

## 2024-04-01 HISTORY — DX: Chronic diastolic (congestive) heart failure: I50.32

## 2024-04-01 HISTORY — PX: BREAST BIOPSY: SHX20

## 2024-04-01 LAB — CBC WITH DIFFERENTIAL/PLATELET
Abs Immature Granulocytes: 0.03 10*3/uL (ref 0.00–0.07)
Basophils Absolute: 0.1 10*3/uL (ref 0.0–0.1)
Basophils Relative: 1 %
Eosinophils Absolute: 0.1 10*3/uL (ref 0.0–0.5)
Eosinophils Relative: 1 %
HCT: 45.7 % (ref 36.0–46.0)
Hemoglobin: 15.6 g/dL — ABNORMAL HIGH (ref 12.0–15.0)
Immature Granulocytes: 0 %
Lymphocytes Relative: 11 %
Lymphs Abs: 0.7 10*3/uL (ref 0.7–4.0)
MCH: 29.9 pg (ref 26.0–34.0)
MCHC: 34.1 g/dL (ref 30.0–36.0)
MCV: 87.5 fL (ref 80.0–100.0)
Monocytes Absolute: 1.1 10*3/uL — ABNORMAL HIGH (ref 0.1–1.0)
Monocytes Relative: 16 %
Neutro Abs: 4.8 10*3/uL (ref 1.7–7.7)
Neutrophils Relative %: 71 %
Platelets: 157 10*3/uL (ref 150–400)
RBC: 5.22 MIL/uL — ABNORMAL HIGH (ref 3.87–5.11)
RDW: 14.6 % (ref 11.5–15.5)
WBC: 6.8 10*3/uL (ref 4.0–10.5)
nRBC: 0 % (ref 0.0–0.2)

## 2024-04-01 LAB — URINALYSIS, ROUTINE W REFLEX MICROSCOPIC
Bacteria, UA: NONE SEEN
Bilirubin Urine: NEGATIVE
Glucose, UA: NEGATIVE mg/dL
Hgb urine dipstick: NEGATIVE
Ketones, ur: 80 mg/dL — AB
Nitrite: NEGATIVE
Protein, ur: NEGATIVE mg/dL
Specific Gravity, Urine: 1.018 (ref 1.005–1.030)
pH: 7 (ref 5.0–8.0)

## 2024-04-01 LAB — COMPREHENSIVE METABOLIC PANEL WITH GFR
ALT: 35 U/L (ref 0–44)
AST: 43 U/L — ABNORMAL HIGH (ref 15–41)
Albumin: 3.9 g/dL (ref 3.5–5.0)
Alkaline Phosphatase: 83 U/L (ref 38–126)
Anion gap: 17 — ABNORMAL HIGH (ref 5–15)
BUN: 14 mg/dL (ref 8–23)
CO2: 19 mmol/L — ABNORMAL LOW (ref 22–32)
Calcium: 10.4 mg/dL — ABNORMAL HIGH (ref 8.9–10.3)
Chloride: 101 mmol/L (ref 98–111)
Creatinine, Ser: 0.93 mg/dL (ref 0.44–1.00)
GFR, Estimated: >60 mL/min (ref >60)
Glucose, Bld: 106 mg/dL — ABNORMAL HIGH (ref 70–99)
Potassium: 2.7 mmol/L — CL (ref 3.5–5.1)
Sodium: 137 mmol/L (ref 135–145)
Total Bilirubin: 2.2 mg/dL — ABNORMAL HIGH (ref 0.0–1.2)
Total Protein: 7 g/dL (ref 6.5–8.1)

## 2024-04-01 LAB — RAPID URINE DRUG SCREEN, HOSP PERFORMED
Amphetamines: NOT DETECTED
Barbiturates: NOT DETECTED
Benzodiazepines: NOT DETECTED
Cocaine: NOT DETECTED
Opiates: POSITIVE — AB
Tetrahydrocannabinol: NOT DETECTED

## 2024-04-01 LAB — PHOSPHORUS: Phosphorus: 1.7 mg/dL — ABNORMAL LOW (ref 2.5–4.6)

## 2024-04-01 LAB — LIPASE, BLOOD: Lipase: 37 U/L (ref 11–51)

## 2024-04-01 LAB — MAGNESIUM: Magnesium: 2 mg/dL (ref 1.7–2.4)

## 2024-04-01 LAB — TSH: TSH: 3.437 u[IU]/mL (ref 0.350–4.500)

## 2024-04-01 MED ORDER — PROCHLORPERAZINE EDISYLATE 10 MG/2ML IJ SOLN
10.0000 mg | Freq: Four times a day (QID) | INTRAMUSCULAR | Status: DC | PRN
  Administered 2024-04-02: 10 mg via INTRAVENOUS
  Filled 2024-04-01: qty 2

## 2024-04-01 MED ORDER — ACETAMINOPHEN 650 MG RE SUPP: 650.0000 mg | Freq: Four times a day (QID) | RECTAL | Status: DC | PRN

## 2024-04-01 MED ORDER — LEVOTHYROXINE SODIUM 75 MCG PO TABS
75.0000 ug | ORAL_TABLET | Freq: Every day | ORAL | Status: DC
  Administered 2024-04-06 – 2024-04-07 (×2): 75 ug via ORAL
  Filled 2024-04-01: qty 1
  Filled 2024-04-01 (×2): qty 3
  Filled 2024-04-01: qty 1

## 2024-04-01 MED ORDER — POTASSIUM CHLORIDE 10 MEQ/100ML IV SOLN
10.0000 meq | INTRAVENOUS | Status: AC
  Administered 2024-04-01 – 2024-04-02 (×6): 10 meq via INTRAVENOUS
  Filled 2024-04-01 (×5): qty 100

## 2024-04-01 MED ORDER — LORATADINE 10 MG PO TABS
10.0000 mg | ORAL_TABLET | Freq: Every day | ORAL | Status: DC
  Administered 2024-04-05 – 2024-04-07 (×3): 10 mg via ORAL
  Filled 2024-04-01 (×5): qty 1

## 2024-04-01 MED ORDER — ONDANSETRON HCL 4 MG/2ML IJ SOLN
4.0000 mg | Freq: Once | INTRAMUSCULAR | Status: AC
  Administered 2024-04-01: 4 mg via INTRAVENOUS
  Filled 2024-04-01: qty 2

## 2024-04-01 MED ORDER — ONDANSETRON HCL 4 MG/2ML IJ SOLN
4.0000 mg | Freq: Four times a day (QID) | INTRAMUSCULAR | Status: DC | PRN
  Administered 2024-04-02 – 2024-04-05 (×5): 4 mg via INTRAVENOUS
  Filled 2024-04-01 (×5): qty 2

## 2024-04-01 MED ORDER — MORPHINE SULFATE (PF) 4 MG/ML IV SOLN
4.0000 mg | INTRAVENOUS | Status: DC | PRN
  Administered 2024-04-02 – 2024-04-06 (×10): 4 mg via INTRAVENOUS
  Filled 2024-04-01 (×10): qty 1

## 2024-04-01 MED ORDER — BUTALBITAL-APAP-CAFFEINE 50-325-40 MG PO TABS: 1.0000 | ORAL_TABLET | Freq: Two times a day (BID) | ORAL | Status: DC | PRN

## 2024-04-01 MED ORDER — K PHOS MONO-SOD PHOS DI & MONO 155-852-130 MG PO TABS
500.0000 mg | ORAL_TABLET | Freq: Two times a day (BID) | ORAL | Status: AC
  Filled 2024-04-01 (×3): qty 2

## 2024-04-01 MED ORDER — PANTOPRAZOLE SODIUM 40 MG IV SOLR
40.0000 mg | Freq: Two times a day (BID) | INTRAVENOUS | Status: DC
  Administered 2024-04-01 – 2024-04-06 (×10): 40 mg via INTRAVENOUS
  Filled 2024-04-01 (×11): qty 10

## 2024-04-01 MED ORDER — MONTELUKAST SODIUM 10 MG PO TABS
10.0000 mg | ORAL_TABLET | Freq: Every day | ORAL | Status: DC
  Administered 2024-04-04 – 2024-04-06 (×3): 10 mg via ORAL
  Filled 2024-04-01 (×5): qty 1

## 2024-04-01 MED ORDER — MELATONIN 3 MG PO TABS
3.0000 mg | ORAL_TABLET | Freq: Every evening | ORAL | Status: DC | PRN
  Filled 2024-04-01: qty 1

## 2024-04-01 MED ORDER — LACTATED RINGERS IV SOLN: INTRAVENOUS | Status: AC

## 2024-04-01 MED ORDER — NALOXONE HCL 0.4 MG/ML IJ SOLN: 0.4000 mg | INTRAMUSCULAR | Status: DC | PRN

## 2024-04-01 MED ORDER — MORPHINE SULFATE (PF) 4 MG/ML IV SOLN
4.0000 mg | Freq: Once | INTRAVENOUS | Status: AC
  Administered 2024-04-01: 4 mg via INTRAVENOUS
  Filled 2024-04-01: qty 1

## 2024-04-01 MED ORDER — SODIUM CHLORIDE 0.9 % IV BOLUS
1000.0000 mL | Freq: Once | INTRAVENOUS | Status: AC
  Administered 2024-04-01: 1000 mL via INTRAVENOUS

## 2024-04-01 MED ORDER — OLANZAPINE 5 MG PO TABS
2.5000 mg | ORAL_TABLET | Freq: Every day | ORAL | Status: DC
  Administered 2024-04-04 – 2024-04-06 (×3): 2.5 mg via ORAL
  Filled 2024-04-01 (×4): qty 1

## 2024-04-01 MED ORDER — ACETAMINOPHEN 325 MG PO TABS: 650.0000 mg | ORAL_TABLET | Freq: Four times a day (QID) | ORAL | Status: DC | PRN

## 2024-04-01 MED ORDER — SERTRALINE HCL 50 MG PO TABS
50.0000 mg | ORAL_TABLET | Freq: Every day | ORAL | Status: DC
  Administered 2024-04-05 – 2024-04-07 (×3): 50 mg via ORAL
  Filled 2024-04-01 (×5): qty 1

## 2024-04-01 NOTE — ED Triage Notes (Signed)
 Pt presents with n/v and unable to tolerate po intake worsening since discharge on 6/15 per husband.  Pt reports she was supposed to have a radiation treatment today, ended up having a biopsy and then felt too weak to continue with treatment. Last chemo June 7 she believes.  Pt is pale and visibly unwell.

## 2024-04-01 NOTE — Telephone Encounter (Signed)
 This RN spoke with pt per her call stating she feels like I am just detoriating even more ( since visit last week).   I went and had the biopsy this morning but just feel too bad that I canceled my radiation appt.  Pt states she cannot barely drink but maybe 3 sips and then I will vomit.  She is unable to take her medications for support therapy due to they don't go down and everything seems to just be stuck in my throat  Per inquiry she states she is likely not taking but 1/2 cup of liquid a day.  She is not taking in any food.  Her last BM was over a week ago- but I do not feel constipated  Above reviewed with our [facility] provider and MD and due to time of day and concerns that need to be addressed pt may need to go the ER for best evaluation and outcome.  Above discussed with pt in agreement.  This RN called report to charge nurse in ER.

## 2024-04-01 NOTE — H&P (Signed)
 History and Physical      Rebecca Steele FMW:[id number] DOB: 1951-06-13 DOA: 04/01/2024; DOS: 04/01/2024  PCP: Abran Jon CROME, MD  Patient coming from: home   I have personally briefly reviewed patient's old medical records in [facility]  Chief Complaint: Nausea vomiting  HPI: Rebecca Steele is a 73 y.o. female with medical history significant for left-sided breast cancer undergoing chemotherapy and radiation, chronic diastolic heart failure, acquired hypothyroidism, who is admitted to [facility] on 04/01/2024 with intractable nausea/vomiting after presenting from home to [facility] ED complaining of nausea vomiting.   The patient has left sided breast cancer for which she is undergoing chemotherapy as well as radiation, following with Dr. Loretha as her outpatient oncologist.  Most recent chemotherapy session was noted to have occurred on 03/19/2024.  The patient presents to the ED this evening complaining of approximately 1 week of persistent nausea resulting in recurrent episodes of nonbloody, nonbilious emesis, and noting that she is experiencing at least 2-3 such episodes per day over that timeframe.  As a consequence, she reports that she has been experiencing very limited ability to tolerate p.o., including food and water  over that timeframe.  Denies any recent hematemesis, melena, or hematochezia.  She reports no significant improvement in the frequency of her nausea/vomiting with outpatient prn Reglan  and prn Zofran .  She notes some intermittent mild, sharp, nonradiating epigastric discomfort over the last 4 to 5 days, worse with palpation.  Not associate with any subjective fever, chills or rigors, or generalized myalgias.  No recent dysuria or gross hematuria.  She was recently hospitalized in the [facility] system from 03/10/2024 to 03/14/2024, with similar nausea/vomiting and abdominal discomfort.  Per chart review, most recent prior liver enzymes were notable for the  following when checked on 03/29/2024: Total bilirubin 1.9, alkaline phosphatase 93, AST 45, ALT 29.  Her medical history is also notable for chronic diastolic heart failure with most recent echocardiogram performed in April 2025, which was notable for LVEF 55 to 60%, no focal wall motion maladies, grade 1 diastolic dysfunction, normal right ventricular systolic function, as well as trivial mitral gravitation.  Not on a scheduled diuretic medications at home.   ED Course:  Vital signs in the ED were notable for the following: Afebrile; initial heart rates in the low 100s, essentially decreasing into the 90s following IV fluids; systolic blood pressures in the 140s to 150s; respiratory rate 15-20, oxygen saturation 96 to 99% on room air.  Labs were notable for the following: CMP notable for the following: Sodium 137, potassium 2.7, bicarbonate 19, anion gap 17, creatinine 0.93 compared to 0.97 on 03/29/2024, glucose 106, calcium  10.4, albumin  3.9, total bilirubin 2.2, alkaline phosphatase 83, AST 43, ALT 35.  Lipase 37.  Phosphorus 1.7.  Magnesium level 2.0.  CBC notable for white cell count 6800, hemoglobin 15.6, other count 157.  Urinalysis has been ordered, Therazole currently pending.  Per my interpretation, EKG in ED demonstrated the following: Sinus rhythm with heart rate 89, normal intervals, nonspecific T wave flattening in V2 and V3, and no evidence of ST changes, including no evidence of ST elevation.  Imaging in the ED, per corresponding formal radiology read, was notable for the following: 1 view chest x-ray showed no evidence of acute cardiopulmonary process, Cleen evidence of infiltrate, edema, effusion, or pneumothorax.  Plain films of the abdomen, 2 views, showed nonobstructive bowel gas pattern, without any evidence of free air.  While in the ED, the following  were administered: Morphine  4 mg IV x 1 dose, Zofran  4 mg IV x 1 dose, normal saline x 1 L bolus, and potassium chloride  60 mill  colons IV over 6 hours was initiated.  Subsequently, the patient was admitted for further evaluation management of intractable nausea/vomiting complicated by hypokalemia, with clinical evidence of dehydration, and additional laboratory abnormalities include anion gap metabolic acidosis, hypercalcemia, hyperbilirubinemia, hypophosphatemia, while also noting mild abdominal discomfort.    Review of Systems: As per HPI otherwise 10 point review of systems negative.   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 states she was given gabapentin  and fentanyl  with breast surgery and was diff to wake up   Depression    History of kidney stones    Hypothyroidism    Mitral valve prolapse    Nausea & vomiting 03/10/2024   Sleep apnea    HAS MILD OSA, NO CPAP NEEDED    Past Surgical History:  Procedure Laterality Date   APPENDECTOMY     BREAST BIOPSY Right 09/16/2022   MM RT BREAST BX W LOC DEV 1ST LESION IMAGE BX SPEC STEREO GUIDE 09/16/2022 [facility]   BREAST BIOPSY  12/06/2022   MM RT RADIOACTIVE SEED LOC MAMMO GUIDE 12/06/2022 [facility]   BREAST BIOPSY Left 11/16/2023   US  LT RADIOACTIVE SEED LOC 11/16/2023 [facility]   BREAST BIOPSY  11/16/2023   MM LT RADIOACTIVE SEED EA ADD LESION LOC MAMMO GUIDE 11/16/2023 [facility]   BREAST BIOPSY Right 04/01/2024   US  RT BREAST BX W LOC DEV 1ST LESION IMG BX SPEC US  GUIDE 04/01/2024 [facility]   BREAST EXCISIONAL BIOPSY Right 2018   BREAST LUMPECTOMY WITH RADIOACTIVE SEED AND SENTINEL LYMPH NODE BIOPSY Left 11/20/2023   Procedure: LEFT BREAST SEED GUIDED LUMPECTOMY, LEFT AXILLARY SENTINEL NODE BIOPSY;  Surgeon: Ebbie Cough, MD;  Location: [facility];  Service: General;  Laterality: Left;  PEC BLOCK    CHOLECYSTECTOMY     CYSTOSCOPY W/ RETROGRADES     KYPHOPLASTY N/A 10/05/2021   Procedure: LUMBAR ONE KYPHOPLASTY;  Surgeon: Cheryle Debby LABOR, MD;  Location: [facility];  Service: Neurosurgery;  Laterality: N/A;   NECK SURGERY     PORTACATH PLACEMENT Right 10/12/2023   Procedure: INSERTION PORT-A-CATH WITH GUIDED ULTRASOUND;  Surgeon: Ebbie Cough, MD;  Location: [facility];  Service: General;  Laterality: Right;   RADIOACTIVE SEED GUIDED AXILLARY SENTINEL LYMPH NODE Left 11/20/2023   Procedure: LEFT AXILLARY NODE SEED GUIDED EXCISION;  Surgeon: Ebbie Cough, MD;  Location: [facility];  Service: General;  Laterality: Left;   RADIOACTIVE SEED GUIDED EXCISIONAL BREAST BIOPSY Right 12/12/2017   Procedure: RIGHT RADIOACTIVE SEED GUIDED EXCISIONAL BREAST BIOPSY ERAS PATHWAY;  Surgeon: Ebbie Cough, MD;  Location: [facility];  Service: General;  Laterality: Right;  LMA   RADIOACTIVE SEED GUIDED EXCISIONAL BREAST BIOPSY Right 12/07/2022   Procedure: RADIOACTIVE SEED GUIDED EXCISIONAL RIGHT BREAST BIOPSY;  Surgeon: Ebbie Cough, MD;  Location: [facility];  Service: General;  Laterality: Right;   SHOULDER SURGERY     SIMPLE MASTECTOMY WITH AXILLARY SENTINEL NODE BIOPSY Left 12/12/2023   Procedure: LEFT  MASTECTOMY;  Surgeon: Ebbie Cough, MD;  Location:  SURGERY CENTER;  Service: General;  Laterality: Left;   TONSILLECTOMY     TOTAL HIP ARTHROPLASTY Left 06/04/2021   Procedure: TOTAL HIP ARTHROPLASTY ANTERIOR APPROACH;  Surgeon: Yvone Rush, MD;  Location: [facility];  Service: Orthopedics;  Laterality: Left;   TRIGGER FINGER RELEASE Right 05/18/2015   Procedure: RIGHT  LONG FINGER TRIGGER RELEASE ;  Surgeon: Alm Hummer, MD;  Location: [facility];  Service: Orthopedics;  Laterality: Right;    Social History:  reports that she has never smoked. She has never used smokeless tobacco. She reports  that she does not drink alcohol  and does not use drugs.   Allergies  Allergen Reactions   Gadolinium Derivatives Anaphylaxis and Shortness Of Breath    Pt was given 17ml multihance  for MRI. After about 75min30 seconds she squeezed the ball saying she felt like her throat was closing up. Exam was DC'd and radiologist gave 75mg  benadryl  IV.    Other     Pt states she was given Gabapentin  and Fentanyl  for anesthesia during recent surgery and was unable to wake up for several hours.  Pt states she does not want to have sedation with those medications in the future.   Valium Anaphylaxis   Paxlovid [Nirmatrelvir-Ritonavir] Hives   Doxycycline  Swelling    Facial swelling Pt has since had without problem   Fentanyl  Other (See Comments)    Slept the whole time for few days   Iohexol  Itching     Desc: Pt. stated she had iv contrast around 25 yrs ago and had itching on her neck.  she took benadryl  and it went away.  The rad recommended premeds and be done tomorrow but the pt.did not want to do that so we did her w/o iv contrast today., Onset Date: [id number]    Macrodantin Nausea And Vomiting   Red Dye #40 (Allura Red) Swelling    Facial swelling (cannot take pink or red tablets or capsules)   Rocephin [Ceftriaxone] Other (See Comments)    Joint aches   Zithromax [Azithromycin] Other (See Comments)    Weakness, cold/clammy, legs trembling    Family History  Problem Relation Age of Onset   Breast cancer Maternal Aunt        51s    Family history reviewed and not pertinent    Prior to Admission medications   Medication Sig Start Date End Date Taking? Authorizing Provider  butalbital -acetaminophen -caffeine  (FIORICET) 50-325-40 MG tablet Take 1 tablet by mouth 2 (two) times daily as needed for headache or migraine.    [provider]  doxycycline  (VIBRA -TABS) 100 MG tablet Take 1 tablet (100 mg total) by mouth 2 (two) times daily. 03/19/24   Iruku, Praveena, MD  levothyroxine   (SYNTHROID ) 75 MCG tablet Take 75 mcg by mouth daily before breakfast.    [provider]  lidocaine -prilocaine  (EMLA ) cream Apply to affected area once 01/16/24   Iruku, Praveena, MD  loratadine  (CLARITIN ) 10 MG tablet Take 10 mg by mouth daily.    [provider]  metoCLOPramide  (REGLAN ) 10 MG tablet Take 1 tablet (10 mg total) by mouth every 6 (six) hours as needed for nausea (nausea/headache). 03/29/24   Iruku, Praveena, MD  montelukast  (SINGULAIR ) 10 MG tablet Take 10 mg by mouth at bedtime.    [provider]  OLANZapine (ZYPREXA) 2.5 MG tablet Take 1 tablet (2.5 mg total) by mouth at bedtime. 03/29/24   Iruku, Praveena, MD  ondansetron  (ZOFRAN -ODT) 8 MG disintegrating tablet Take 1 tablet (8 mg total) by mouth every 8 (eight)  hours as needed for nausea or vomiting. 03/08/24   Dewey Rush, MD  pantoprazole  (PROTONIX ) 40 MG tablet Take 1 tablet (40 mg total) by mouth daily. 03/15/24   Pokhrel, Laxman, MD  potassium chloride  (KLOR-CON  M) 10 MEQ tablet Take 1 tablet (10 mEq total) by mouth 2 (two) times daily. 03/29/24   Iruku, Praveena, MD  prochlorperazine  (COMPAZINE ) 10 MG tablet Take 1 tablet (10 mg total) by mouth every 6 (six) hours as needed for nausea or vomiting. 01/16/24   Iruku, Praveena, MD  sertraline  (ZOLOFT ) 50 MG tablet Take 50 mg by mouth daily.    [provider]  sucralfate  (CARAFATE ) 1 GM/10ML suspension Take 10 mLs (1 g total) by mouth 4 (four) times daily -  with meals and at bedtime. 03/14/24   Pokhrel, Laxman, MD  traZODone  (DESYREL ) 100 MG tablet Take 100 mg by mouth at bedtime.    [provider]     Objective    Physical Exam: Vitals:   04/01/24 1528 04/01/24 1834  BP: (!) 145/104 (!) 158/72  Pulse: (!) 107 87  Resp: 20 15  Temp: 98.3 F (36.8 C) 97.9 F (36.6 C)  TempSrc: Oral Oral  SpO2: 99% 96%    General: appears to be stated age; alert, oriented Skin: warm, dry, no rash Head:  AT/[place] Mouth:  Oral mucosa membranes  appear dry, normal dentition Neck: supple; trachea midline Heart: Mildly tachycardic, but regular; did not appreciate any M/R/G Lungs: CTAB, did not appreciate any wheezes, rales, or rhonchi Abdomen: + BS; soft, ND, mild tenderness with palpation over the epigastrium, in the absence of any associated guarding, rigidity, or rebound tenderness Vascular: 2+ pedal pulses b/l; 2+ radial pulses b/l Extremities: no peripheral edema, no muscle wasting Neuro: strength and sensation intact in upper and lower extremities b/l    Labs on Admission: I have personally reviewed following labs and imaging studies  CBC: Recent Labs  Lab 03/28/24 1411 04/01/24 1710  WBC 6.4 6.8  NEUTROABS 4.6 4.8  HGB 15.1* 15.6*  HCT 42.8 45.7  MCV 85.4 87.5  PLT 157 157   Basic Metabolic Panel: Recent Labs  Lab 03/28/24 1411 04/01/24 1710  NA 139 137  K 3.0* 2.7*  CL 102 101  CO2 26 19*  GLUCOSE 101* 106*  BUN 8 14  CREATININE 0.97 0.93  CALCIUM  10.4* 10.4*  MG 1.9 2.0  PHOS  --  1.7*   GFR: Estimated Creatinine Clearance: 56.7 mL/min (by C-G formula based on SCr of 0.93 mg/dL). Liver Function Tests: Recent Labs  Lab 03/28/24 1411 04/01/24 1710  AST 45* 43*  ALT 29 35  ALKPHOS 93 83  BILITOT 1.9* 2.2*  PROT 7.1 7.0  ALBUMIN  4.3 3.9   Recent Labs  Lab 04/01/24 1710  LIPASE 37   No results for input(s): AMMONIA  in the last 168 hours. Coagulation Profile: No results for input(s): INR, PROTIME in the last 168 hours. Cardiac Enzymes: No results for input(s): CKTOTAL, CKMB, CKMBINDEX, TROPONINI in the last 168 hours. BNP (last 3 results) No results for input(s): PROBNP in the last 8760 hours. HbA1C: No results for input(s): HGBA1C in the last 72 hours. CBG: No results for input(s): GLUCAP in the last 168 hours. Lipid Profile: No results for input(s): CHOL, HDL, LDLCALC, TRIG, CHOLHDL, LDLDIRECT in the last 72 hours. Thyroid  Function Tests: No results  for input(s): TSH, T4TOTAL, FREET4, T3FREE, THYROIDAB in the last 72 hours. Anemia Panel: No results for input(s): VITAMINB12, FOLATE, FERRITIN, TIBC, IRON,  RETICCTPCT in the last 72 hours. Urine analysis:    Component Value Date/Time   COLORURINE YELLOW 03/09/2024 1817   APPEARANCEUR CLEAR 03/09/2024 1817   LABSPEC 1.015 03/09/2024 1817   PHURINE 6.5 03/09/2024 1817   GLUCOSEU NEGATIVE 03/09/2024 1817   HGBUR TRACE (A) 03/09/2024 1817   BILIRUBINUR NEGATIVE 03/09/2024 1817   KETONESUR 15 (A) 03/09/2024 1817   PROTEINUR NEGATIVE 03/09/2024 1817   UROBILINOGEN 0.2 08/23/2011 0312   NITRITE NEGATIVE 03/09/2024 1817   LEUKOCYTESUR TRACE (A) 03/09/2024 1817    Radiological Exams on Admission: DG ABD ACUTE 2+V W 1V CHEST Result Date: 04/01/2024 CLINICAL DATA:  Nausea and vomiting. Unable to tolerate p.o. intake. EXAM: DG ABDOMEN ACUTE WITH 1 VIEW CHEST COMPARISON:  Chest CT 03/08/2024 abdominal radiograph 03/10/2024 FINDINGS: Right chest port in place. The heart is normal in size. No focal airspace disease, pleural effusion or pneumothorax. No pulmonary edema. Left mastectomy with surgical clips in the axilla. No free intra-abdominal air. No gaseous small bowel distension. Air within nondilated transverse colon, with a few air-fluid levels. Small volume of formed stool in the colon. Right upper quadrant surgical clips. L1 kyphoplasty. Left hip arthroplasty. Stable sclerotic focus within the right inter trochanteric femur. IMPRESSION: 1. No acute findings in the chest. 2. Nonobstructive bowel gas pattern. Air within nondilated transverse colon with a few air-fluid levels, nonspecific. This can be seen with diarrheal process. 3. Stable sclerotic focus in the right proximal femur. Electronically Signed   By: Andrea Gasman M.D.   On: 04/01/2024 16:42   US  RT BREAST BX W LOC DEV 1ST LESION IMG BX SPEC US  GUIDE Result Date: 04/01/2024 CLINICAL DATA:  Patient presents for  ultrasound-guided core needle biopsy of a right breast mass. 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 EXAM: ULTRASOUND GUIDED RIGHT BREAST CORE NEEDLE BIOPSY COMPARISON:  Previous exam(s). PROCEDURE: I met with the patient and we discussed the procedure of ultrasound-guided biopsy, including benefits and alternatives. We discussed the high likelihood of a successful procedure. We discussed the risks of the procedure, including infection, bleeding, tissue injury, clip migration, and inadequate sampling. Informed written consent was given. The usual time-out protocol was performed immediately prior to the procedure. Lesion quadrant: Lower inner quadrant Using sterile technique and 1% Lidocaine  as local anesthetic, under direct ultrasound visualization, a 14 gauge spring-loaded device was used to perform biopsy of the irregular mass in the right breast at 4 o'clock, 6 cm the nipple, using a medial approach. At the conclusion of the procedure a HydroMARK, spiral shaped tissue marker clip was deployed into the biopsy cavity. Follow up 2 view mammogram was not performed since this lesion could not be visualized mammographically on the diagnostic exam from 03/28/2024. IMPRESSION: Ultrasound guided biopsy of a right breast mass. No apparent complications. Electronically Signed   By: Alm Parkins M.D.   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  This is in the context of her underlying left-sided breast cancer, for which she is undergoing chemotherapy, with most recent chemotherapy session on 03/19/2024.  She is a nondiabetic.  Presenting plain films show no evidence of bowel obstruction.  Lipase is not elevated, and liver enzymes are notable for mild elevation in total bilirubin, but otherwise nonelevated, and overall appearing noncholestatic in nature.   Will provide additional IV fluids, and symptomatic management.  In terms of considerations for antiemetic options, it is noted that she has a documented history of anaphylactic response to Valium.  Consequently, prn Ativan as an antiemetic will not be an option for her.  She has noted to be on scheduled Zyprexa nightly, which may provide some antiemetic benefits in the setting of its antidopaminergic mechanism.   Plan: Continuous LR.  Prn IV Zofran . Prn IV Compazine  for nausea/vomiting refractory to prn zofran .  Monitor strict I's and O's and daily weights.  Repeat CMP, CBC in the morning.  Recheck serum magnesium level in the morning.  Check UDS. Check TSH.  Check direct bilirubin.  Continue outpatient scheduled Zyprexa, as above.                #) Epigastric pain: intermittent epigastric discomfort, worsening with palpation as well as with the act of vomiting, raising the possibility of an element of musculoskeletal etiology stemming from the recurrent vomiting itself. No e/o acute peritoneal findings on exam. no radiographic evidence of bowel obstruction or perforation on presenting plain films of the abdomen.  Lipase is not elevated.  Differential also includes possibility of mild esophagitis as a consequence of her recurrent nausea/vomiting as well as in the  setting of recent chemotherapy.  No evidence to suggest acute GI bleed.  Mild hypercalcemia is also noted, although likely without significant elevation to pose a significant contributor to her abdominal pain.  She has very mild elevation in her total bilirubin, although alkaline phosphatase is not elevated and trending down relative to most recent prior liver enzymes, rendering cholestatic process to be less likely.  Additionally, AST ALT within normal limits.  Overall, acute cholecystitis, clinically, appears less likely, and she is nonseptic appearing.  If significant interval increase in liver enzymes or significant ensuing worsening of abdominal pain, may consider additional dedicated abdominal imaging to further evaluate.  Will also assess for any mild hemolytic etiology contributing to your elevated total bilirubin, as below.   Plan: Prn IV morphine .  Further evaluation management of presenting intractable nausea/vomiting, as above.  Continuous IV fluids.  Repeat CMP, CBC in the morning.  UDS.  Recheck lipase in the morning.  Protonix  40 mg IV bid, first dose now.  Direct bilirubin level, PTT, INR, LDH..  Incentive spirometry.                       #) Hypokalemia: presenting potassium level noted to be 2.7.  Appears to be in the setting of presenting intractable nausea/vomiting over the last week, with associated very limited oral intake over that timeframe.  Appears to have a recurrent element in the setting of chemotherapy, and it is noted that the patient is on potassium chloride  10 mill equivalents p.o. twice daily as an outpatient, although has been limited in her ability to tolerate her home oral medications over the last  week.  EDP is ordered 60 mill equivalents of IV potassium chloride  every 6 hours.  Magnesium level within normal limits.  plan: monitor on tele.  Continue the 60 mg of IV potassium chloride  as ordered by EDP.  Additionally, will provide some oral K-Phos given  concomitant hypophosphatemia.  Lactated Ringer 's, as above.  Repeat CMP, magnesium level in the morning.                    #) Dehydration: Clinical suspicion for such, including the appearance of dry oral mucous membranes as well as noting improvement in initial sinus tachycardia with IV fluids.  Appears to be in the setting of   intractable nausea/vomiting over the course the last week.  Limited oral intake over that timeframe.  No e/o associated hypotension.  She has received a total of 1 L NS in the ED.   Plan: Monitor strict I's and O's.  Daily weights.  CMP in the morning.  As the patient will be receiving 600 mL of IV fluid via her 60 mEq of IV potassium chloride , will be slightly more conservative with rate of her additional IV fluids for now.  Specifically have ordered lactated Ringer 's at 50 cc/hr overnight, with possibility of increasing rate of the LR following completion of the patient's IV potassium chloride  supplementation.                  # (Anion gap metabolic acidosis: Identified on presenting CMP.  Suspect contribution from starvation keto lactic acidosis, as well as potential lactic acidosis as a consequence of her underlying malignancy itself.  No history of diabetes, and no significant elevation in presenting glucose to suggest underlying DKA.  Renal function appears to be at baseline.  Given mild interval increase in hyperbilirubinemia, will check PTT, INR to evaluate hepatic synthetic function.  No evidence of underlying infectious process at this time, although urinalysis is currently pending.  Plan: IV fluids, as above.  Check urinalysis, urine drug screen, PTT, INR.  Repeat CMP, CBC in the morning.  Further evaluation management of presenting nausea/vomiting, as above.                  #) Hypercalcemia: Presenting calcium  level noted to be mildly elevated at 10.4, without need for adjustment for albumin  level of 3.9.  Suspect  contribution from dehydration, as above, although most recent prior calcium  level was also mildly elevated.  In this context, and noting that her presenting phosphorus level is also low, will also check intact PTH.  Differential would also include paraneoplastic syndrome in the context of her left-sided breast cancer.  No overt pharmacologic contributions.  Magnesium level found to be within normal limits.  Plan: IV fluids, as above.  Further evaluation management presenting dehydration, as above.  Check intact PTH.  Repeat serum magnesium and phosphorus levels in the morning.  CMP in the morning.                 #) Hypophosphatemia: Present phosphorus level low at 1.7.  Notable in the context of concomitant hypercalcemia.  Plan: K-Phos 500 mg p.o. twice daily x 2 doses.  Repeat serum phosphorus level in the morning.  Follow-up for result intact PTH, as above.                #) History of left-sided breast cancer: Documented history of such, with documentation of metastasis to lymph node.  Undergoing chemotherapy, most recently on 03/19/2024.  He is also  undergoing radiation, and appears to have undergone daily radiation over the last week.  Follows with Dr. Loretha as her outpatient oncologist.  Has Port-A-Cath on right side in place.  Plan: Further evaluation and management of presenting intractable nausea/vomiting as well as dehydration, as further detailed above. I've added her outpatient oncologist, Dr. Praveena Iruku, to the treatment team starting at 7 AM on 7/1.                     #) Chronic diastolic heart failure: documented history of such, with most recent echocardiogram performed in April 2025, which was notable for LVEF 55 to 60%, grade 1 diastolic dysfunction, with additional results as conveyed above. No clinical or radiographic evidence to suggest acutely decompensated heart failure at this time. home diuretic regimen reportedly consists of  the following: None.  Given plan for additional IV fluids in the setting of clinical evidence of dehydration, will closely monitor for any ensuing evidence to suggest developing acute volume overload.  Plan: monitor strict I's & O's and daily weights. Repeat CMP in AM. Check serum mag level.  Check BNP to establish baseline.                     #) acquired hypothyroidism: documented h/o such, on Synthroid  as outpatient.   Plan: cont home Synthroid .  Follow for result of TSH level.                      #) Allergic Rhinitis: documented h/o such, on scheduled Claritin  as an outpatient, in the absence of any use of intranasal corticosteroids.      Plan: cont home Claritin .                        #) Depression: documented h/o such. On Zoloft  as outpatient.  Presenting serum sodium 137, and no evidence of QTc prolongation on presenting EKG.  Plan: Continue home Zoloft .      DVT prophylaxis: SCD's   Code Status: Full code Family Communication: none Disposition Plan: Per Rounding Team Consults called: I've added her outpatient oncologist, Dr. Amber Loretha, to the treatment team starting at 7 AM on 7/1. ;  Admission status: Observation    I SPENT GREATER THAN 75  MINUTES IN CLINICAL CARE TIME/MEDICAL DECISION-MAKING IN COMPLETING THIS ADMISSION.     Eva NOVAK Kinga Cassar DO [facility] From 7PM - 7AM   04/01/2024, 7:44 PM

## 2024-04-01 NOTE — ED Notes (Signed)
 Received call from [facility], pt is a chemo pt, has not tolerated Chemo well. Last treatment 2 weeks ago, No BM x 1 wk, unable to eat or drink, more noted over last 3 days.

## 2024-04-01 NOTE — ED Provider Notes (Signed)
 [place] EMERGENCY DEPARTMENT AT [facility] Provider Note   CSN: [id number] Arrival date & time: 04/01/24  1522     Patient presents with: Emesis   Rebecca Steele is a 73 y.o. female.   The history is provided by the patient, the spouse and medical records. No language interpreter was used.  Emesis    73 year old female history of breast cancer currently receiving chemotherapy presenting with complaints of nausea and vomiting.  Patient has persistent nausea despite taking Zofran  twice daily, Reglan  twice daily, and Protonix  daily.  She is currently on Kadcyla  which maycontribute to her nausea.  She recently had a core biopsy a couple days ago.  Currently patient complaining of extreme nausea, generalized fatigue, unable to swallow anything down and does not have much of an appetite for more than a week.  She also reported not passing flatus or having any bowel movement for the same duration.  No fever or chills no shortness of breath no urinary symptoms.  Prior to Admission medications   Medication Sig Start Date End Date Taking? Authorizing Provider  butalbital -acetaminophen -caffeine  (FIORICET) 50-325-40 MG tablet Take 1 tablet by mouth 2 (two) times daily as needed for headache or migraine.    [provider]  doxycycline  (VIBRA -TABS) 100 MG tablet Take 1 tablet (100 mg total) by mouth 2 (two) times daily. 03/19/24   Iruku, Praveena, MD  levothyroxine  (SYNTHROID ) 75 MCG tablet Take 75 mcg by mouth daily before breakfast.    [provider]  lidocaine -prilocaine  (EMLA ) cream Apply to affected area once 01/16/24   Iruku, Praveena, MD  loratadine  (CLARITIN ) 10 MG tablet Take 10 mg by mouth daily.    [provider]  metoCLOPramide  (REGLAN ) 10 MG tablet Take 1 tablet (10 mg total) by mouth every 6 (six) hours as needed for nausea (nausea/headache). 03/29/24   Iruku, Praveena, MD  montelukast  (SINGULAIR ) 10 MG tablet Take 10 mg by mouth at bedtime.     [provider]  OLANZapine (ZYPREXA) 2.5 MG tablet Take 1 tablet (2.5 mg total) by mouth at bedtime. 03/29/24   Iruku, Praveena, MD  ondansetron  (ZOFRAN -ODT) 8 MG disintegrating tablet Take 1 tablet (8 mg total) by mouth every 8 (eight) hours as needed for nausea or vomiting. 03/08/24   Dewey Rush, MD  pantoprazole  (PROTONIX ) 40 MG tablet Take 1 tablet (40 mg total) by mouth daily. 03/15/24   Pokhrel, Laxman, MD  potassium chloride  (KLOR-CON  M) 10 MEQ tablet Take 1 tablet (10 mEq total) by mouth 2 (two) times daily. 03/29/24   Iruku, Praveena, MD  prochlorperazine  (COMPAZINE ) 10 MG tablet Take 1 tablet (10 mg total) by mouth every 6 (six) hours as needed for nausea or vomiting. 01/16/24   Iruku, Praveena, MD  sertraline  (ZOLOFT ) 50 MG tablet Take 50 mg by mouth daily.    [provider]  sucralfate  (CARAFATE ) 1 GM/10ML suspension Take 10 mLs (1 g total) by mouth 4 (four) times daily -  with meals and at bedtime. 03/14/24   Pokhrel, Laxman, MD  traZODone  (DESYREL ) 100 MG tablet Take 100 mg by mouth at bedtime.    [provider]    Allergies: Gadolinium derivatives, Other, Valium, Paxlovid [nirmatrelvir-ritonavir], Doxycycline , Fentanyl , Iohexol , Macrodantin, Red dye #40 (allura red), Rocephin [ceftriaxone], and Zithromax [azithromycin]    Review of Systems  Gastrointestinal:  Positive for vomiting.  All other systems reviewed and are negative.   Updated Vital Signs BP (!) 145/104 (BP Location: Left Arm)   Pulse (!) 107  Temp 98.3 F (36.8 C) (Oral)   Resp 20   SpO2 99%   Physical Exam Vitals and nursing note reviewed.  Constitutional:      General: She is not in acute distress.    Appearance: She is well-developed.  HENT:     Head: Atraumatic.     Mouth/Throat:     Mouth: Mucous membranes are dry.   Eyes:     Conjunctiva/sclera: Conjunctivae normal.    Cardiovascular:     Rate and Rhythm: Tachycardia present.     Pulses: Normal pulses.     Heart  sounds: Normal heart sounds.  Pulmonary:     Effort: Pulmonary effort is normal.  Abdominal:     Tenderness: There is abdominal tenderness (Mild tenderness to right side abdomen without guarding or rebound tenderness.  Decreased bowel sounds.).   Musculoskeletal:        General: Normal range of motion.     Cervical back: Neck supple.   Skin:    Findings: No rash.   Neurological:     Mental Status: She is alert. Mental status is at baseline.   Psychiatric:        Mood and Affect: Mood normal.     (all labs ordered are listed, but only abnormal results are displayed) Labs Reviewed  CBC WITH DIFFERENTIAL/PLATELET - Abnormal; Notable for the following components:      Result Value   RBC 5.22 (*)    Hemoglobin 15.6 (*)    Monocytes Absolute 1.1 (*)    All other components within normal limits  COMPREHENSIVE METABOLIC PANEL WITH GFR - Abnormal; Notable for the following components:   Potassium 2.7 (*)    CO2 19 (*)    Glucose, Bld 106 (*)    Calcium  10.4 (*)    AST 43 (*)    Total Bilirubin 2.2 (*)    Anion gap 17 (*)    All other components within normal limits  PHOSPHORUS - Abnormal; Notable for the following components:   Phosphorus 1.7 (*)    All other components within normal limits  LIPASE, BLOOD  MAGNESIUM  URINALYSIS, ROUTINE W REFLEX MICROSCOPIC    EKG: None  Radiology: US  RT BREAST BX W LOC DEV 1ST LESION IMG BX SPEC US  GUIDE Result Date: 04/01/2024 CLINICAL DATA:  Patient presents for ultrasound-guided core needle biopsy of a right breast mass. 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 EXAM: ULTRASOUND GUIDED RIGHT BREAST CORE NEEDLE BIOPSY COMPARISON:  Previous exam(s). PROCEDURE: I met with the patient and we discussed the procedure of ultrasound-guided biopsy, including benefits and alternatives. We discussed the high likelihood of a  successful procedure. We discussed the risks of the procedure, including infection, bleeding, tissue injury, clip migration, and inadequate sampling. Informed written consent was given. The usual time-out protocol was performed immediately prior to the procedure. Lesion quadrant: Lower inner quadrant Using sterile technique and 1% Lidocaine  as local anesthetic, under direct ultrasound visualization, a 14 gauge spring-loaded device was used to perform biopsy of the irregular mass in the right breast at 4 o'clock, 6 cm the nipple, using a medial approach. At the conclusion of the procedure a HydroMARK, spiral shaped tissue marker clip was deployed into the biopsy cavity. Follow up 2 view mammogram was not performed since this lesion could not be visualized mammographically on the diagnostic exam from 03/28/2024. IMPRESSION: Ultrasound guided biopsy of  a right breast mass. No apparent complications. Electronically Signed   By: Alm Parkins M.D.   On: 04/01/2024 12:05     Procedures   Medications Ordered in the ED  potassium chloride  10 mEq in 100 mL IVPB (0 mEq Intravenous Stopped 04/01/24 1915)  morphine  (PF) 4 MG/ML injection 4 mg (has no administration in time range)  sodium chloride  0.9 % bolus 1,000 mL (1,000 mLs Intravenous New Bag/Given 04/01/24 1719)  ondansetron  (ZOFRAN ) injection 4 mg (4 mg Intravenous Given 04/01/24 1720)                                    Medical Decision Making Amount and/or Complexity of Data Reviewed Labs: ordered. Radiology: ordered. ECG/medicine tests: ordered.  Risk Prescription drug management.   BP (!) 145/104 (BP Location: Left Arm)   Pulse (!) 107   Temp 98.3 F (36.8 C) (Oral)   Resp 20   SpO2 99%   61:100 PM  73 year old female history of breast cancer currently receiving chemotherapy presenting with complaints of nausea and vomiting.  Patient has persistent nausea despite taking Zofran  twice daily, Reglan  twice daily, and Protonix  daily.  She is  currently on Kadcyla  which maycontribute to her nausea.  She recently had a core biopsy a couple days ago.  Currently patient complaining of extreme nausea, generalized fatigue, unable to swallow anything down and does not have much of an appetite for more than a week.  She also reported not passing flatus or having any bowel movement for the same duration.  No fever or chills no shortness of breath no urinary symptoms.  On exam patient is laying in bed nontoxic in appearance.  Mouth is dry.  She is mildly tachycardic.  Abdomen with decreased bowel sounds and mildly tender to palpation.  Strength equal throughout.  She is mentating appropriately.  -Labs ordered, independently viewed and interpreted by me.  Labs remarkable for K+ 2.7, I have ordered supplementation.  PT cannot tolerates PO at this time.  Anion gap 17, likely starvation ketosis.   -The patient was maintained on a cardiac monitor.  I personally viewed and interpreted the cardiac monitored which showed an underlying rhythm of: NSR -Imaging independently viewed and interpreted by me and I agree with radiologist's interpretation.  Result remarkable for acute abdominal series without signs of SBO.   -This patient presents to the ED for concern of nausea, this involves an extensive number of treatment options, and is a complaint that carries with it a high risk of complications and morbidity.  The differential diagnosis includes chemo intolerance, esophageal spasm, esophageal stricture, SBO, stress response -Co morbidities that complicate the patient evaluation includes breast cancer -Treatment includes IV fluid, Reglan , Zofran , morphine  -Reevaluation of the patient after these medicines showed that the patient improved -PCP office notes or outside notes reviewed -Discussion with specialist [facility] Dr. Evaristo who agrees to admit pt for intractable nausea and hypokalemia -Escalation to admission/observation considered: patient  agreeable with admission      Final diagnoses:  Intractable nausea  Hypokalemia    ED Discharge Orders     None          Nivia Colon, PA-C 04/01/24 1931    Randol Simmonds, MD 04/02/24 1551

## 2024-04-02 ENCOUNTER — Ambulatory Visit: Payer: Self-pay | Admitting: Hematology and Oncology

## 2024-04-02 ENCOUNTER — Ambulatory Visit: Admission: RE | Admit: 2024-04-02 | Source: Ambulatory Visit

## 2024-04-02 ENCOUNTER — Ambulatory Visit

## 2024-04-02 DIAGNOSIS — R112 Nausea with vomiting, unspecified: Secondary | ICD-10-CM | POA: Diagnosis not present

## 2024-04-02 LAB — SURGICAL PATHOLOGY

## 2024-04-02 LAB — CBC WITH DIFFERENTIAL/PLATELET
Abs Immature Granulocytes: 0.02 10*3/uL (ref 0.00–0.07)
Basophils Absolute: 0.1 10*3/uL (ref 0.0–0.1)
Basophils Relative: 1 %
Eosinophils Absolute: 0.2 10*3/uL (ref 0.0–0.5)
Eosinophils Relative: 3 %
HCT: 41.4 % (ref 36.0–46.0)
Hemoglobin: 13.9 g/dL (ref 12.0–15.0)
Immature Granulocytes: 0 %
Lymphocytes Relative: 12 %
Lymphs Abs: 0.6 10*3/uL — ABNORMAL LOW (ref 0.7–4.0)
MCH: 30.4 pg (ref 26.0–34.0)
MCHC: 33.6 g/dL (ref 30.0–36.0)
MCV: 90.6 fL (ref 80.0–100.0)
Monocytes Absolute: 0.9 10*3/uL (ref 0.1–1.0)
Monocytes Relative: 17 %
Neutro Abs: 3.5 10*3/uL (ref 1.7–7.7)
Neutrophils Relative %: 67 %
Platelets: 130 10*3/uL — ABNORMAL LOW (ref 150–400)
RBC: 4.57 MIL/uL (ref 3.87–5.11)
RDW: 15.1 % (ref 11.5–15.5)
WBC: 5.3 10*3/uL (ref 4.0–10.5)
nRBC: 0 % (ref 0.0–0.2)

## 2024-04-02 LAB — COMPREHENSIVE METABOLIC PANEL WITH GFR
ALT: 29 U/L (ref 0–44)
AST: 35 U/L (ref 15–41)
Albumin: 3.5 g/dL (ref 3.5–5.0)
Alkaline Phosphatase: 72 U/L (ref 38–126)
Anion gap: 13 (ref 5–15)
BUN: 14 mg/dL (ref 8–23)
CO2: 21 mmol/L — ABNORMAL LOW (ref 22–32)
Calcium: 9.5 mg/dL (ref 8.9–10.3)
Chloride: 104 mmol/L (ref 98–111)
Creatinine, Ser: 0.99 mg/dL (ref 0.44–1.00)
GFR, Estimated: >60 mL/min (ref >60)
Glucose, Bld: 87 mg/dL (ref 70–99)
Potassium: 3.3 mmol/L — ABNORMAL LOW (ref 3.5–5.1)
Sodium: 138 mmol/L (ref 135–145)
Total Bilirubin: 2.2 mg/dL — ABNORMAL HIGH (ref 0.0–1.2)
Total Protein: 6.1 g/dL — ABNORMAL LOW (ref 6.5–8.1)

## 2024-04-02 LAB — PHOSPHORUS: Phosphorus: 3.4 mg/dL (ref 2.5–4.6)

## 2024-04-02 LAB — LIPASE, BLOOD: Lipase: 39 U/L (ref 11–51)

## 2024-04-02 LAB — PROTIME-INR
INR: 1 (ref 0.8–1.2)
Prothrombin Time: 14 s (ref 11.4–15.2)

## 2024-04-02 LAB — LACTATE DEHYDROGENASE: LDH: 124 U/L (ref 98–192)

## 2024-04-02 LAB — MAGNESIUM: Magnesium: 2.1 mg/dL (ref 1.7–2.4)

## 2024-04-02 LAB — APTT: aPTT: 26 s (ref 24–36)

## 2024-04-02 LAB — BILIRUBIN, DIRECT: Bilirubin, Direct: 0.3 mg/dL — ABNORMAL HIGH (ref 0.0–0.2)

## 2024-04-02 LAB — BRAIN NATRIURETIC PEPTIDE: B Natriuretic Peptide: 14.1 pg/mL (ref 0.0–100.0)

## 2024-04-02 MED ORDER — SODIUM CHLORIDE 0.9% FLUSH
10.0000 mL | Freq: Two times a day (BID) | INTRAVENOUS | Status: DC
  Administered 2024-04-02 – 2024-04-04 (×4): 10 mL
  Administered 2024-04-04: 40 mL
  Administered 2024-04-04: 10 mL
  Administered 2024-04-05: 20 mL
  Administered 2024-04-05 – 2024-04-07 (×4): 10 mL

## 2024-04-02 MED ORDER — POLYETHYLENE GLYCOL 3350 17 G PO PACK
17.0000 g | PACK | Freq: Two times a day (BID) | ORAL | Status: DC
  Administered 2024-04-04 – 2024-04-07 (×7): 17 g via ORAL
  Filled 2024-04-02 (×8): qty 1

## 2024-04-02 MED ORDER — RADIAPLEXRX EX GEL
Freq: Two times a day (BID) | CUTANEOUS | Status: DC
  Administered 2024-04-07: 1 via TOPICAL
  Filled 2024-04-02: qty 170

## 2024-04-02 MED ORDER — CHLORHEXIDINE GLUCONATE CLOTH 2 % EX PADS
6.0000 | MEDICATED_PAD | Freq: Every day | CUTANEOUS | Status: DC
  Administered 2024-04-02 – 2024-04-06 (×5): 6 via TOPICAL

## 2024-04-02 MED ORDER — SODIUM CHLORIDE 0.9% FLUSH: 10.0000 mL | INTRAVENOUS | Status: DC | PRN

## 2024-04-02 MED ORDER — DIPHENHYDRAMINE HCL 50 MG/ML IJ SOLN
12.5000 mg | Freq: Once | INTRAMUSCULAR | Status: AC
  Administered 2024-04-03: 12.5 mg via INTRAVENOUS
  Filled 2024-04-02: qty 1

## 2024-04-02 NOTE — Progress Notes (Signed)
 PROGRESS NOTE    Rebecca Steele  FMW:[id number] DOB: 06-03-1951 DOA: 04/01/2024 PCP: Abran Jon CROME, MD   Brief Narrative:  Rebecca Steele is a 73 y.o. female with medical history significant for left-sided breast cancer undergoing chemotherapy (last session 6/17) and radiation, chronic diastolic heart failure, acquired hypothyroidism, who is admitted to [facility] on 04/01/2024 with intractable nausea/vomiting over the past 2 months.   Assessment & Plan:   Principal Problem:   Intractable nausea and vomiting Active Problems:   Hypokalemia   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)   Dehydration   Epigastric pain   High anion gap metabolic acidosis   Hypercalcemia   Hyperbilirubinemia   Hypophosphatemia   Chronic diastolic CHF (congestive heart failure) (HCC)   Acquired hypothyroidism   Allergic rhinitis   Depression   Intractable nausea vomiting, unclear etiology, likely multifactorial Intractable epigastric abdominal pain Hypokalemia Dehydration Anion gap metabolic acidosis - Patient presents with prolonged episodes of nausea vomiting, upwards of 2 months per husband at bedside - Unclear if related to ongoing treatments for known left breast cancer - Patient reports no bowel movement in 1 week -concern for underlying gastroparesis, p.o. supportive measures including MiraLAX  and Senokot as well as as needed enemas until bowel movement.  Previous abdominal x-ray showed no notable obstructive pathology  Hypercalcemia/hypophosphatemia, unclear etiology -Unclear etiology, mildly elevated over last few labs -Would suspect hemoconcentration during this hospitalization but given prior outpatient labs was mildly elevated -PTH pending, if low consider vitamin D/PTHrH testing to evaluate further - Would suspect hypercalcemia of malignancy given patient's established left breast cancer  L Breast CA Radiation dermatitis Established with Dr  Loretha -appreciate their insight and recommendations Continue supportive care topical ointments on irritation/skin changes left chest wall  Anxiety/depression unspecified, questionably poorly controlled - Not currently on any regimen at home, reported allergy to Valium complicates treatment course - Discussed possible initiation of SSRI/SNRI given patient's diagnosis, prognosis and ongoing symptoms  HFpEF -chronic - not in acute exacerbation  Acquired hypothyroidism -Resume home supplementation once p.o. is established -if unable to tolerate p.o. in the next 24 hours we will transition to IV  Rhinitis, allergic - Continue supportive care   DVT prophylaxis: SCDs Start: 04/01/24 1934 Code Status:   Code Status: Full Code Family Communication: Husband at bedside  Status is: Inpatient  Dispo: The patient is from: Home              Anticipated d/c is to: Home              Anticipated d/c date is: 48 to 72 hours              Patient currently not medically stable for discharge  Consultants:  Oncology  Procedures:  None  Antimicrobials:  None indicated  Subjective: No acute issues or events overnight, nausea vomiting appear to be minimally improving but not resolved.  Continues to have difficulty with p.o. medications, even liquids, otherwise denies headache fever chills chest pain or shortness of breath  Objective: Vitals:   04/01/24 1834 04/01/24 2140 04/02/24 0244 04/02/24 0628  BP: (!) 158/72 (!) 146/86 135/67 (!) 140/83  Pulse: 87 85 77 78  Resp: 15 18 18 18   Temp: 97.9 F (36.6 C) 97.8 F (36.6 C) 98.1 F (36.7 C) 98.4 F (36.9 C)  TempSrc: Oral Oral Oral Oral  SpO2: 96% 99% 95% 95%    Intake/Output Summary (Last 24 hours) at 04/02/2024 0806 Last  data filed at 04/01/2024 2200 Gross per 24 hour  Intake 94.89 ml  Output 400 ml  Net -305.11 ml   There were no vitals filed for this visit.  Examination:  General:  Pleasantly resting in bed, No acute distress.   Anxious in appearance HEENT:  Normocephalic atraumatic.  Sclerae nonicteric, noninjected.  Extraocular movements intact bilaterally. Neck:  Without mass or deformity.  Trachea is midline. Lungs:  Clear to auscultate bilaterally without rhonchi, wheeze, or rales. Heart:  Regular rate and rhythm.  Without murmurs, rubs, or gallops. Abdomen:  Soft, nontender, nondistended.  Without guarding or rebound. Extremities: Without cyanosis, clubbing, edema, or obvious deformity. Skin: Noted skin changes left lateral chest and breast  Data Reviewed: I have personally reviewed following labs and imaging studies  CBC: Recent Labs  Lab 03/28/24 1411 04/01/24 1710 04/02/24 0521  WBC 6.4 6.8 5.3  NEUTROABS 4.6 4.8 3.5  HGB 15.1* 15.6* 13.9  HCT 42.8 45.7 41.4  MCV 85.4 87.5 90.6  PLT 157 157 130*   Basic Metabolic Panel: Recent Labs  Lab 03/28/24 1411 04/01/24 1710 04/02/24 0521  NA 139 137 138  K 3.0* 2.7* 3.3*  CL 102 101 104  CO2 26 19* 21*  GLUCOSE 101* 106* 87  BUN 8 14 14   CREATININE 0.97 0.93 0.99  CALCIUM  10.4* 10.4* 9.5  MG 1.9 2.0 2.1  PHOS  --  1.7* 3.4   GFR: Estimated Creatinine Clearance: 53.3 mL/min (by C-G formula based on SCr of 0.99 mg/dL).  Liver Function Tests: Recent Labs  Lab 03/28/24 1411 04/01/24 1710 04/02/24 0521  AST 45* 43* 35  ALT 29 35 29  ALKPHOS 93 83 72  BILITOT 1.9* 2.2* 2.2*  PROT 7.1 7.0 6.1*  ALBUMIN  4.3 3.9 3.5   Recent Labs  Lab 04/01/24 1710 04/02/24 0521  LIPASE 37 39   Coagulation Profile: Recent Labs  Lab 04/02/24 0521  INR 1.0   Thyroid  Function Tests: Recent Labs    04/01/24 2203  TSH 3.437   No results found for this or any previous visit (from the past 240 hours).   Radiology Studies: DG ABD ACUTE 2+V W 1V CHEST Result Date: 04/01/2024 CLINICAL DATA:  Nausea and vomiting. Unable to tolerate p.o. intake. EXAM: DG ABDOMEN ACUTE WITH 1 VIEW CHEST COMPARISON:  Chest CT 03/08/2024 abdominal radiograph 03/10/2024  FINDINGS: Right chest port in place. The heart is normal in size. No focal airspace disease, pleural effusion or pneumothorax. No pulmonary edema. Left mastectomy with surgical clips in the axilla. No free intra-abdominal air. No gaseous small bowel distension. Air within nondilated transverse colon, with a few air-fluid levels. Small volume of formed stool in the colon. Right upper quadrant surgical clips. L1 kyphoplasty. Left hip arthroplasty. Stable sclerotic focus within the right inter trochanteric femur. IMPRESSION: 1. No acute findings in the chest. 2. Nonobstructive bowel gas pattern. Air within nondilated transverse colon with a few air-fluid levels, nonspecific. This can be seen with diarrheal process. 3. Stable sclerotic focus in the right proximal femur. Electronically Signed   By: Andrea Gasman M.D.   On: 04/01/2024 16:42   US  RT BREAST BX W LOC DEV 1ST LESION IMG BX SPEC US  GUIDE Result Date: 04/01/2024 CLINICAL DATA:  Patient presents for ultrasound-guided core needle biopsy of a right breast mass. 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 EXAM: ULTRASOUND GUIDED RIGHT BREAST CORE  NEEDLE BIOPSY COMPARISON:  Previous exam(s). PROCEDURE: I met with the patient and we discussed the procedure of ultrasound-guided biopsy, including benefits and alternatives. We discussed the high likelihood of a successful procedure. We discussed the risks of the procedure, including infection, bleeding, tissue injury, clip migration, and inadequate sampling. Informed written consent was given. The usual time-out protocol was performed immediately prior to the procedure. Lesion quadrant: Lower inner quadrant Using sterile technique and 1% Lidocaine  as local anesthetic, under direct ultrasound visualization, a 14 gauge spring-loaded device was used to perform biopsy of the irregular mass in the right  breast at 4 o'clock, 6 cm the nipple, using a medial approach. At the conclusion of the procedure a HydroMARK, spiral shaped tissue marker clip was deployed into the biopsy cavity. Follow up 2 view mammogram was not performed since this lesion could not be visualized mammographically on the diagnostic exam from 03/28/2024. IMPRESSION: Ultrasound guided biopsy of a right breast mass. No apparent complications. Electronically Signed   By: Alm Parkins M.D.   On: 04/01/2024 12:05   Scheduled Meds:  levothyroxine   75 mcg Oral Q0600   loratadine   10 mg Oral Daily   montelukast   10 mg Oral QHS   OLANZapine  2.5 mg Oral QHS   pantoprazole  (PROTONIX ) IV  40 mg Intravenous Q12H   phosphorus  500 mg Oral BID   sertraline   50 mg Oral Daily   Continuous Infusions:   LOS: 0 days   Time spent:  Elsie JAYSON Montclair, DO [facility]  If 7PM-7AM, please contact night-coverage [email or web address]  04/02/2024, 8:06 AM

## 2024-04-02 NOTE — Plan of Care (Signed)

## 2024-04-02 NOTE — Consult Note (Addendum)
 WOC Nurse Consult Note: Reason for Consult: Requested to assess a burning due radiation. Performed remotely evaluating the photos and notes. Discussed with bed nurse about Radioplex use on site.  Wound type: Partial thickness. Pressure Injury POA: NA Measurement: whole axilla, partial neck and mastectomy area. Wound bed: hyperemia, partial peeling skin on the axilla. Drainage (amount, consistency, odor) See flowsheet. Periwound: intact, redness. Dressing procedure/placement/frequency: Apply Radiaplex twice a day or PRN. Ordered by the pharmacy. The pt is already using this product.  WOC team will not plan to follow further.  Please reconsult if further assistance is needed. Thank-you,  Lela Holm BSN, RN, [employer], WOC  (Pager: [phone])

## 2024-04-02 NOTE — Plan of Care (Signed)
  Problem: Pain Managment: Goal: General experience of comfort will improve and/or be controlled Outcome: Progressing   Problem: Safety: Goal: Ability to remain free from injury will improve Outcome: Progressing   Problem: Skin Integrity: Goal: Risk for impaired skin integrity will decrease Outcome: Not Progressing

## 2024-04-02 NOTE — Progress Notes (Signed)
   04/02/24 1645  Spiritual Encounters  Type of Visit Initial  Care provided to: Patient  Referral source Other (comment) (Spiritual Consult)  Reason for visit Urgent spiritual support  OnCall Visit No   Chaplain responded to a spiritual consult, the patient, Rebecca Steele, received devestating news today pertaining to her cancer. The news crushed her as she went from thinking she was halfway through her treatments to I am at the beginning again!. Rebecca Steele explored another option she intends to inquire about as far as an cure She is concerned with the chemo treatments because they have not been easy for her. Rebecca Steele is expressing shock,grief anger and frustration with this news, all appropriate reactions and holds them with determination as she looks for a quality of life in this mess.   Carley [place] [facility]  [phone]

## 2024-04-03 ENCOUNTER — Ambulatory Visit
Admission: RE | Admit: 2024-04-03 | Discharge: 2024-04-03 | Disposition: A | Discharge status: Home / Self Care | Source: Ambulatory Visit | Attending: Radiation Oncology | Admitting: Radiation Oncology

## 2024-04-03 ENCOUNTER — Ambulatory Visit

## 2024-04-03 ENCOUNTER — Other Ambulatory Visit: Payer: Self-pay | Admitting: General Surgery

## 2024-04-03 ENCOUNTER — Encounter: Payer: Self-pay | Admitting: Radiology

## 2024-04-03 ENCOUNTER — Other Ambulatory Visit: Payer: Self-pay

## 2024-04-03 ENCOUNTER — Other Ambulatory Visit: Payer: Self-pay | Admitting: Hematology and Oncology

## 2024-04-03 DIAGNOSIS — I341 Nonrheumatic mitral (valve) prolapse: Secondary | ICD-10-CM | POA: Diagnosis present

## 2024-04-03 DIAGNOSIS — Z79899 Other long term (current) drug therapy: Secondary | ICD-10-CM | POA: Insufficient documentation

## 2024-04-03 DIAGNOSIS — Y842 Radiological procedure and radiotherapy as the cause of abnormal reaction of the patient, or of later complication, without mention of misadventure at the time of the procedure: Secondary | ICD-10-CM | POA: Diagnosis present

## 2024-04-03 DIAGNOSIS — Z888 Allergy status to other drugs, medicaments and biological substances status: Secondary | ICD-10-CM | POA: Diagnosis not present

## 2024-04-03 DIAGNOSIS — L598 Other specified disorders of the skin and subcutaneous tissue related to radiation: Secondary | ICD-10-CM | POA: Diagnosis present

## 2024-04-03 DIAGNOSIS — C50912 Malignant neoplasm of unspecified site of left female breast: Secondary | ICD-10-CM | POA: Insufficient documentation

## 2024-04-03 DIAGNOSIS — I5032 Chronic diastolic (congestive) heart failure: Secondary | ICD-10-CM | POA: Diagnosis present

## 2024-04-03 DIAGNOSIS — Z79890 Hormone replacement therapy (postmenopausal): Secondary | ICD-10-CM | POA: Diagnosis not present

## 2024-04-03 DIAGNOSIS — Z171 Estrogen receptor negative status [ER-]: Secondary | ICD-10-CM | POA: Insufficient documentation

## 2024-04-03 DIAGNOSIS — N6091 Unspecified benign mammary dysplasia of right breast: Secondary | ICD-10-CM | POA: Insufficient documentation

## 2024-04-03 DIAGNOSIS — R1013 Epigastric pain: Secondary | ICD-10-CM | POA: Diagnosis present

## 2024-04-03 DIAGNOSIS — Z96642 Presence of left artificial hip joint: Secondary | ICD-10-CM | POA: Diagnosis present

## 2024-04-03 DIAGNOSIS — Z9012 Acquired absence of left breast and nipple: Secondary | ICD-10-CM | POA: Diagnosis not present

## 2024-04-03 DIAGNOSIS — E039 Hypothyroidism, unspecified: Secondary | ICD-10-CM | POA: Diagnosis present

## 2024-04-03 DIAGNOSIS — R17 Unspecified jaundice: Secondary | ICD-10-CM | POA: Diagnosis present

## 2024-04-03 DIAGNOSIS — E872 Acidosis, unspecified: Secondary | ICD-10-CM | POA: Diagnosis present

## 2024-04-03 DIAGNOSIS — F419 Anxiety disorder, unspecified: Secondary | ICD-10-CM | POA: Diagnosis present

## 2024-04-03 DIAGNOSIS — Z91041 Radiographic dye allergy status: Secondary | ICD-10-CM | POA: Diagnosis not present

## 2024-04-03 DIAGNOSIS — F32A Depression, unspecified: Secondary | ICD-10-CM | POA: Diagnosis present

## 2024-04-03 DIAGNOSIS — E876 Hypokalemia: Secondary | ICD-10-CM | POA: Diagnosis present

## 2024-04-03 DIAGNOSIS — C50211 Malignant neoplasm of upper-inner quadrant of right female breast: Secondary | ICD-10-CM | POA: Diagnosis present

## 2024-04-03 DIAGNOSIS — Z9221 Personal history of antineoplastic chemotherapy: Secondary | ICD-10-CM | POA: Diagnosis not present

## 2024-04-03 DIAGNOSIS — C50212 Malignant neoplasm of upper-inner quadrant of left female breast: Secondary | ICD-10-CM | POA: Diagnosis present

## 2024-04-03 DIAGNOSIS — R112 Nausea with vomiting, unspecified: Secondary | ICD-10-CM | POA: Diagnosis present

## 2024-04-03 DIAGNOSIS — J309 Allergic rhinitis, unspecified: Secondary | ICD-10-CM | POA: Diagnosis present

## 2024-04-03 DIAGNOSIS — E860 Dehydration: Secondary | ICD-10-CM | POA: Diagnosis present

## 2024-04-03 LAB — PARATHYROID HORMONE, INTACT (NO CA): PTH: 14 pg/mL — ABNORMAL LOW (ref 15–65)

## 2024-04-03 LAB — COMPREHENSIVE METABOLIC PANEL WITH GFR
ALT: 27 U/L (ref 0–44)
AST: 31 U/L (ref 15–41)
Albumin: 3.6 g/dL (ref 3.5–5.0)
Alkaline Phosphatase: 78 U/L (ref 38–126)
Anion gap: 9 (ref 5–15)
BUN: 10 mg/dL (ref 8–23)
CO2: 22 mmol/L (ref 22–32)
Calcium: 9 mg/dL (ref 8.9–10.3)
Chloride: 104 mmol/L (ref 98–111)
Creatinine, Ser: 0.92 mg/dL (ref 0.44–1.00)
GFR, Estimated: >60 mL/min (ref >60)
Glucose, Bld: 101 mg/dL — ABNORMAL HIGH (ref 70–99)
Potassium: 2.9 mmol/L — ABNORMAL LOW (ref 3.5–5.1)
Sodium: 135 mmol/L (ref 135–145)
Total Bilirubin: 2.3 mg/dL — ABNORMAL HIGH (ref 0.0–1.2)
Total Protein: 6.3 g/dL — ABNORMAL LOW (ref 6.5–8.1)

## 2024-04-03 LAB — RAD ONC ARIA SESSION SUMMARY
Course Elapsed Days: 44
Plan Fractions Treated to Date: 14
Plan Fractions Treated to Date: 27
Plan Prescribed Dose Per Fraction: 1.8 Gy
Plan Prescribed Dose Per Fraction: 1.8 Gy
Plan Total Fractions Prescribed: 14
Plan Total Fractions Prescribed: 28
Plan Total Prescribed Dose: 25.2 Gy
Plan Total Prescribed Dose: 50.4 Gy
Reference Point Dosage Given to Date: 25.2 Gy
Reference Point Dosage Given to Date: 48.6 Gy
Reference Point Session Dosage Given: 1.8 Gy
Reference Point Session Dosage Given: 1.8 Gy
Session Number: 27

## 2024-04-03 LAB — CBC
HCT: 41.6 % (ref 36.0–46.0)
Hemoglobin: 13.8 g/dL (ref 12.0–15.0)
MCH: 29.9 pg (ref 26.0–34.0)
MCHC: 33.2 g/dL (ref 30.0–36.0)
MCV: 90.2 fL (ref 80.0–100.0)
Platelets: 131 10*3/uL — ABNORMAL LOW (ref 150–400)
RBC: 4.61 MIL/uL (ref 3.87–5.11)
RDW: 14.9 % (ref 11.5–15.5)
WBC: 5.3 10*3/uL (ref 4.0–10.5)
nRBC: 0 % (ref 0.0–0.2)

## 2024-04-03 MED ORDER — DIPHENHYDRAMINE HCL 50 MG/ML IJ SOLN
12.5000 mg | Freq: Once | INTRAMUSCULAR | Status: AC
  Administered 2024-04-04: 12.5 mg via INTRAVENOUS
  Filled 2024-04-03: qty 1

## 2024-04-03 MED ORDER — POTASSIUM CHLORIDE 10 MEQ/100ML IV SOLN
10.0000 meq | INTRAVENOUS | Status: AC
  Administered 2024-04-03 (×3): 10 meq via INTRAVENOUS
  Filled 2024-04-03 (×4): qty 100

## 2024-04-03 MED ORDER — POTASSIUM CHLORIDE 20 MEQ PO PACK: 40.0000 meq | PACK | Freq: Once | ORAL | Status: DC

## 2024-04-03 MED ORDER — ENSURE PLUS HIGH PROTEIN PO LIQD
237.0000 mL | Freq: Two times a day (BID) | ORAL | Status: DC
  Administered 2024-04-03 – 2024-04-06 (×4): 237 mL via ORAL

## 2024-04-03 MED ORDER — POTASSIUM CHLORIDE 10 MEQ/100ML IV SOLN
10.0000 meq | INTRAVENOUS | Status: AC
  Administered 2024-04-03 (×3): 10 meq via INTRAVENOUS
  Filled 2024-04-03 (×2): qty 100

## 2024-04-03 NOTE — Progress Notes (Signed)
 Subjective/Chief Complaint: Patient well known to me admitted with n/v. Has new right breast cancer.   Objective: Vital signs in last 24 hours: Temp:  [98.2 F (36.8 C)-99.4 F (37.4 C)] 98.8 F (37.1 C) (07/02 0506) Pulse Rate:  [78-99] 82 (07/02 0506) Resp:  [18-20] 18 (07/02 0506) BP: (128-163)/(76-78) 149/77 (07/02 0506) SpO2:  [93 %-98 %] 93 % (07/02 0506) Weight:  [77.1 kg-78.5 kg] 78.5 kg (07/02 0500) Last BM Date : 03/31/24  Intake/Output from previous day: 07/01 0701 - 07/02 0700 In: 360 [P.O.:360] Out: -  Intake/Output this shift: No intake/output data recorded.    Lab Results:  Recent Labs    04/02/24 0521 04/03/24 0500  WBC 5.3 5.3  HGB 13.9 13.8  HCT 41.4 41.6  PLT 130* 131*   BMET Recent Labs    04/02/24 0521 04/03/24 0500  NA 138 135  K 3.3* 2.9*  CL 104 104  CO2 21* 22  GLUCOSE 87 101*  BUN 14 10  CREATININE 0.99 0.92  CALCIUM  9.5 9.0   PT/INR Recent Labs    04/02/24 0521  LABPROT 14.0  INR 1.0   ABG No results for input(s): PHART, HCO3 in the last 72 hours.  Invalid input(s): PCO2, PO2  Studies/Results: US  RT BREAST BX W LOC DEV 1ST LESION IMG BX SPEC US  GUIDE Addendum Date: 04/02/2024 ADDENDUM REPORT: 04/02/2024 15:03 ADDENDUM: Pathology revealed GRADE II INVASIVE DUCTAL CARCINOMA, DUCTAL CARCINOMA IN SITU, INTERMEDIATE GRADE, LYMPHOVASCULAR INVASION: NOT IDENTIFIED, CALCIFICATIONS: NOT IDENTIFIED of the RIGHT breast, 4 o'clock, 6 cmfn, (hydromark clip). This was found to be concordant by Dr. Alm Parkins. Pathology results were discussed with the patient by telephone. The patient reported doing well after the biopsy with tenderness at the site. Post biopsy instructions and care were reviewed and questions were answered. The patient was encouraged to call [facility] for any additional concerns. My direct phone number was provided. Biopsy results were sent to Dr. Donnice Bury and Dr.  Amber Stalls via secure EPIC message on April 02, 2024. The patient was admitted to [facility] on April 01, 2024 and is currently an inpatient. Pathology results reported by Hendricks Benders, RN on 04/02/2024. Electronically Signed   By: Alm Parkins M.D.   On: 04/02/2024 15:03   Result Date: 04/02/2024 CLINICAL DATA:  Patient presents for ultrasound-guided core needle biopsy of a right breast mass. 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 EXAM: ULTRASOUND GUIDED RIGHT BREAST CORE NEEDLE BIOPSY COMPARISON:  Previous exam(s). PROCEDURE: I met with the patient and we discussed the procedure of ultrasound-guided biopsy, including benefits and alternatives. We discussed the high likelihood of a successful procedure. We discussed the risks of the procedure, including infection, bleeding, tissue injury, clip migration, and inadequate sampling. Informed written consent was given. The usual time-out protocol was performed immediately prior to the procedure. Lesion quadrant: Lower inner quadrant Using sterile technique and 1% Lidocaine  as local anesthetic, under direct ultrasound visualization, a 14 gauge spring-loaded device was used to perform biopsy of the irregular mass in the right breast at 4 o'clock, 6 cm the nipple, using a medial approach. At the conclusion of the procedure a HydroMARK, spiral shaped tissue marker clip was deployed into the biopsy cavity. Follow up 2 view mammogram was not performed since this lesion could not be visualized mammographically on the diagnostic exam from 03/28/2024.  IMPRESSION: Ultrasound guided biopsy of a right breast mass. No apparent complications. Electronically Signed: By: Alm Parkins M.D. On: 04/01/2024 12:05   DG ABD ACUTE 2+V W 1V CHEST Result Date: 04/01/2024 CLINICAL DATA:  Nausea and vomiting. Unable to tolerate p.o. intake. EXAM: DG ABDOMEN ACUTE  WITH 1 VIEW CHEST COMPARISON:  Chest CT 03/08/2024 abdominal radiograph 03/10/2024 FINDINGS: Right chest port in place. The heart is normal in size. No focal airspace disease, pleural effusion or pneumothorax. No pulmonary edema. Left mastectomy with surgical clips in the axilla. No free intra-abdominal air. No gaseous small bowel distension. Air within nondilated transverse colon, with a few air-fluid levels. Small volume of formed stool in the colon. Right upper quadrant surgical clips. L1 kyphoplasty. Left hip arthroplasty. Stable sclerotic focus within the right inter trochanteric femur. IMPRESSION: 1. No acute findings in the chest. 2. Nonobstructive bowel gas pattern. Air within nondilated transverse colon with a few air-fluid levels, nonspecific. This can be seen with diarrheal process. 3. Stable sclerotic focus in the right proximal femur. Electronically Signed   By: Andrea Gasman M.D.   On: 04/01/2024 16:42    Anti-infectives: Anti-infectives (From admission, onward)    None       Assessment/Plan: Clinical stage I right breast cancer Stage IIB left breast cancer  -discussed new cancer. This is 1.6 cm grade II IDC with no lad on us .  Will proceed with mastectomy/sn biopsy likely in a couple weeks once she recovers from current episode. I will see in office next week -I wrote for RD consult for some supplements as she will need this prior to surgery -I also wrote for PT consult -had questions for rad onc- I have messaged Dr Dewey about seeing her while she is in the hospital as well -appreciate TR[facility] care   I reviewed hospitalist notes, last 24 h vitals and pain scores, last 48 h intake and output, last 24 h labs and trends, and last 24 h imaging results.  I reviewed mm/us  and path results from recent breast biopsy as well.   Donnice Bury 04/03/2024

## 2024-04-03 NOTE — Plan of Care (Signed)
  Problem: Clinical Measurements: Goal: Will remain free from infection Outcome: Progressing   Problem: Nutrition: Goal: Adequate nutrition will be maintained Outcome: Progressing   Problem: Coping: Goal: Level of anxiety will decrease Outcome: Progressing   Problem: Pain Managment: Goal: General experience of comfort will improve and/or be controlled Outcome: Progressing   Problem: Safety: Goal: Ability to remain free from injury will improve Outcome: Progressing

## 2024-04-03 NOTE — Care Management Obs Status (Signed)
 MEDICARE OBSERVATION STATUS NOTIFICATION   Patient Details  Name: LEVON PENNING MRN: [id number] Date of Birth: 05-14-1951   Medicare Observation Status Notification Given:  Yes    Toy LITTIE Agar, RN 04/03/2024, 9:37 AM

## 2024-04-03 NOTE — Progress Notes (Signed)
 Initial Nutrition Assessment  INTERVENTION:   -Ensure Plus High Protein po BID, each supplement provides 350 kcal and 20 grams of protein. -Provide Zofran  prior to supplement.  -Magic cup BID with meals, each supplement provides 290 kcal and 9 grams of protein   -If Ensure doesn't work out, can trial [employer] or [employer] as well  -Continue bowel regimen  -Attempt to get weight on scale  NUTRITION DIAGNOSIS:   Increased nutrient needs related to chronic illness, cancer and cancer related treatments as evidenced by estimated needs.  GOAL:   Patient will meet greater than or equal to 90% of their needs  MONITOR:   PO intake, Supplement acceptance  REASON FOR ASSESSMENT:   Consult Diet education  ASSESSMENT:   73 y.o. female with medical history significant for left-sided breast cancer undergoing chemotherapy (last session 6/17) and radiation, chronic diastolic heart failure, acquired hypothyroidism, who is admitted to [facility] on 04/01/2024 with intractable nausea/vomiting over the past 2 months.  Patient in room, states yesterday she ate some soup and this was the first time she has tolerated anything PO for a while. Has been having N/V for 2 months PTA.  Plan is to have patient take a dose of Zofran  prior to any PO to help control nausea and help with tolerance. Discussed that is Ensure doesn't stay down can try other options. States ice cream has stayed down in the past so willing to try Magic cups as well.  States she has had trouble with swallowing but this may be related to frequent vomiting. States foods taste off as well.   Per patient UBW ~198 lbs. Per weight records, pt has lost 20 lbs since 4/15 (10% wt loss x 2.5 months, significant for time frame).  Medications: Miralax , KLOR-CON , KCl, Zofran  Labs reviewed: Low K  NUTRITION - FOCUSED PHYSICAL EXAM:  Unable to complete, pt received phone call  Diet Order:   Diet Order             Diet  regular Room service appropriate? Yes; Fluid consistency: Thin  Diet effective now                   EDUCATION NEEDS:   Education needs have been addressed  Skin:  Skin Assessment: Skin Integrity Issues: Skin Integrity Issues:: Other (Comment) Other: ICD left chest  Last BM:  PTA  Height:   Ht Readings from Last 1 Encounters:  04/02/24 5' 5 (1.651 m)    Weight:   Wt Readings from Last 1 Encounters:  04/03/24 78.5 kg    BMI:  Body mass index is 28.79 kg/m.  Estimated Nutritional Needs:   Kcal:  1750-1950  Protein:  85-100g  Fluid:  2L/day  Morna Lee, MS, RD, LDN Inpatient Clinical Dietitian Contact via Secure chat

## 2024-04-03 NOTE — Progress Notes (Signed)
 PROGRESS NOTE    Rebecca Steele  FMW:[id number] DOB: December 17, 1950 DOA: 04/01/2024 PCP: Abran Jon CROME, MD   Brief Narrative:  This 73 yrs old female with PMH significant for left-sided breast cancer undergoing chemotherapy (last session 6/17) and radiation, chronic diastolic heart failure, acquired hypothyroidism, who is admitted to [facility] on 04/01/2024 with intractable nausea/vomiting over the past 2 months.   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 Unclear if related to ongoing treatments for known left breast cancer. Patient reports no bowel movement in 1 week -concern for underlying gastroparesis, p.o. supportive measures including MiraLAX  and Senokot as well as as needed enemas until bowel movement.  Previous abdominal x-ray showed no notable obstructive pathology.   Hypercalcemia / Hypophosphatemia, unclear etiology: Unclear etiology, mildly elevated over last few labs. Would suspect hemoconcentration during this hospitalization but given prior outpatient labs was mildly elevated. PTH pending, if low consider vitamin D/PTHrH testing to evaluate further. Would suspect hypercalcemia of malignancy given patient's established left breast cancer.   L Breast CA: Radiation dermatitis: Established with Dr Loretha -appreciate their insight and  recommendations. Continue supportive care,  topical ointments on irritation / skin changes left chest wall. General surgery will proceed with mastectomy/sn biopsy likely in a couple weeks once she recovers from current episode.   Anxiety/depression unspecified: Not currently on any regimen at home, reported allergy to Valium complicates treatment course. Discussed possible initiation of SSRI/SNRI given patient's diagnosis, prognosis and ongoing symptoms   HFpEF -chronic - not in acute exacerbation.   Acquired hypothyroidism: Resume home supplementation once p.o. is established -if unable to tolerate p.o. in the next 24 hours we will transition to IV.   Rhinitis, allergic: - Continue supportive care.   DVT prophylaxis: SCDs Code Status: Full code Family Communication: No family at bed side. Disposition Plan:    Status is: Inpatient Remains inpatient appropriate because: Severity of illness   Consultants:  Oncology  Procedures:  Antimicrobials:  Anti-infectives (From admission, onward)    None       Subjective: Patient was seen and examined at bedside.  Patient reports feeling better. Patient a lot of questions for radiation oncology.   Objective: Vitals:   04/02/24 2129 04/03/24 0500 04/03/24 0506 04/03/24 1320  BP: (!) 163/76  (!) 149/77 (!) 147/80  Pulse: 87  82 91  Resp: 18  18 18   Temp: 98.5 F (36.9 C)  98.8 F (37.1 C) 98.6 F (37 C)  TempSrc: Oral  Oral Oral  SpO2: 96%  93% 94%  Weight:  78.5 kg    Height:        Intake/Output Summary (Last 24 hours) at 04/03/2024 1355 Last data filed at 04/03/2024 0600 Gross per 24 hour  Intake 360 ml  Output --  Net 360 ml   Filed Weights   04/02/24 1459 04/03/24 0500  Weight: 77.1 kg 78.5 kg  Examination:  General exam: Appears calm and comfortable, not in any acute distress. Respiratory system:  CTA Bilaterally. Respiratory effort normal. RR 16 Cardiovascular system: S1 & S2 heard, RRR. No JVD, murmurs,  rubs, gallops or clicks. No pedal edema. Gastrointestinal system: Abdomen is non distended, soft and non tender.  Normal bowel sounds heard. Central nervous system: Alert and oriented X 3. No focal neurological deficits. Extremities: No edema, no cyanosis, no clubbing Skin: No rashes, lesions or ulcers Psychiatry: Judgement and insight appear normal. Mood & affect appropriate.     Data Reviewed: I have personally reviewed following labs and imaging studies  CBC: Recent Labs  Lab 03/28/24 1411 04/01/24 1710 04/02/24 0521 04/03/24 0500  WBC 6.4 6.8 5.3 5.3  NEUTROABS 4.6 4.8 3.5  --   HGB 15.1* 15.6* 13.9 13.8  HCT 42.8 45.7 41.4 41.6  MCV 85.4 87.5 90.6 90.2  PLT 157 157 130* 131*   Basic Metabolic Panel: Recent Labs  Lab 03/28/24 1411 04/01/24 1710 04/02/24 0521 04/03/24 0500  NA 139 137 138 135  K 3.0* 2.7* 3.3* 2.9*  CL 102 101 104 104  CO2 26 19* 21* 22  GLUCOSE 101* 106* 87 101*  BUN 8 14 14 10   CREATININE 0.97 0.93 0.99 0.92  CALCIUM  10.4* 10.4* 9.5 9.0  MG 1.9 2.0 2.1  --   PHOS  --  1.7* 3.4  --    GFR: Estimated Creatinine Clearance: 57.2 mL/min (by C-G formula based on SCr of 0.92 mg/dL). Liver Function Tests: Recent Labs  Lab 03/28/24 1411 04/01/24 1710 04/02/24 0521 04/03/24 0500  AST 45* 43* 35 31  ALT 29 35 29 27  ALKPHOS 93 83 72 78  BILITOT 1.9* 2.2* 2.2* 2.3*  PROT 7.1 7.0 6.1* 6.3*  ALBUMIN  4.3 3.9 3.5 3.6   Recent Labs  Lab 04/01/24 1710 04/02/24 0521  LIPASE 37 39   No results for input(s): AMMONIA  in the last 168 hours. Coagulation Profile: Recent Labs  Lab 04/02/24 0521  INR 1.0   Cardiac Enzymes: No results for input(s): CKTOTAL, CKMB, CKMBINDEX, TROPONINI in the last 168 hours. BNP (last 3 results) No results for input(s): PROBNP in the last 8760 hours. HbA1C: No results for input(s): HGBA1C in the last 72 hours. CBG: No results for input(s): GLUCAP in the last 168 hours. Lipid Profile: No results  for input(s): CHOL, HDL, LDLCALC, TRIG, CHOLHDL, LDLDIRECT in the last 72 hours. Thyroid  Function Tests: Recent Labs    04/01/24 2203  TSH 3.437   Anemia Panel: No results for input(s): VITAMINB12, FOLATE, FERRITIN, TIBC, IRON, RETICCTPCT in the last 72 hours. Sepsis Labs: No results for input(s): PROCALCITON, LATICACIDVEN in the last 168 hours.  No results found for this or any previous visit (from the past 240 hours).   Radiology Studies: DG ABD ACUTE 2+V W 1V CHEST Result Date: 04/01/2024 CLINICAL DATA:  Nausea and vomiting. Unable to tolerate p.o. intake. EXAM: DG ABDOMEN ACUTE WITH 1 VIEW CHEST COMPARISON:  Chest CT 03/08/2024 abdominal radiograph 03/10/2024 FINDINGS: Right chest port in place. The heart is normal in size. No focal airspace disease, pleural effusion or pneumothorax. No pulmonary edema. Left mastectomy with surgical clips in the axilla. No free intra-abdominal air. No gaseous small bowel distension. Air within nondilated transverse colon, with a few air-fluid levels. Small volume of formed stool in the colon. Right upper quadrant surgical clips. L1 kyphoplasty. Left hip arthroplasty. Stable sclerotic focus within the right inter trochanteric femur. IMPRESSION: 1. No acute findings  in the chest. 2. Nonobstructive bowel gas pattern. Air within nondilated transverse colon with a few air-fluid levels, nonspecific. This can be seen with diarrheal process. 3. Stable sclerotic focus in the right proximal femur. Electronically Signed   By: Andrea Gasman M.D.   On: 04/01/2024 16:42    Scheduled Meds:  Chlorhexidine  Gluconate Cloth  6 each Topical Daily   feeding supplement  237 mL Oral BID BM   hyaluronate sodium   Topical BID   levothyroxine   75 mcg Oral Q0600   loratadine   10 mg Oral Daily   montelukast   10 mg Oral QHS   OLANZapine  2.5 mg Oral QHS   pantoprazole  (PROTONIX ) IV  40 mg Intravenous Q12H   polyethylene glycol  17 g Oral BID    potassium chloride   40 mEq Oral Once   sertraline   50 mg Oral Daily   sodium chloride  flush  10-40 mL Intracatheter Q12H   Continuous Infusions:   LOS: 0 days    Time spent: 50 MINS    Darcel Dawley, MD [facility]   If 7PM-7AM, please contact night-coverage

## 2024-04-03 NOTE — Progress Notes (Addendum)
 [facility] CONSULT NOTE  Patient Care Team: Abran Jon CROME, MD as PCP - General (Internal Medicine) Ebbie Cough, MD as Consulting Physician (General Surgery) Curlene Agent, MD as Consulting Physician (Obstetrics and Gynecology) Dyane Rush, MD (Inactive) as Consulting Physician (Gastroenterology) Loretha Ash, MD as Consulting Physician (Hematology and Oncology) Glean Stephane BROCKS, RN (Inactive) as Oncology Nurse Navigator Tyree Nanetta SAILOR, RN as Oncology Nurse Navigator  CHIEF COMPLAINTS/PURPOSE OF CONSULTATION:  Breast cancer follow up.  HISTORY OF PRESENTING ILLNESS:   Rebecca Steele 73 y.o. female is here because of recent diagnosis of left breast cancer and now with new diagnosis of right breast cancer  I reviewed her records extensively and collaborated the history with the patient.  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)  09/22/2023 Mammogram   Patient self palpated a breast mass in her left breast around November 2019 went for diagnostic mammogram, this showed there is a developing density on mammogram, 2 of the lower left axillary lymph nodes exhibit increased cortical thickness, ultrasound confirmed a 2.7 cm irregular hypoechoic mass in left breast at 12:00 5 cm from the nipple along with abnormal left axillary lymph   09/29/2023 Pathology Results   Left breast central superior needle core biopsy 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)   10/20/2023 - 10/23/2023 Chemotherapy   Patient is on Treatment Plan : BREAST  Docetaxel  + Carboplatin  + Trastuzumab  + Pertuzumab   (TCHP) q21d      11/24/2023 Cancer  Staging   Staging form: Breast, AJCC 8th Edition - Pathologic stage from 11/24/2023: ypT2, pN1, cM0, G3, ER-, PR-, HER2+ - Signed by Loretha Ash, MD on 11/24/2023 Stage prefix: Post-therapy Histologic grading system: 3 grade system   01/16/2024 -  Chemotherapy   Patient is on Treatment Plan : BREAST ADO-Trastuzumab Emtansine  (Kadcyla ) q21d       History of Present Illness  She is admitted with intractable nausea, vomiting.  She is also diagnosed with right-sided breast cancer, IDC grade 2, prognostics pending. 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 She is feeling better from a nausea standpoint, she was able to eat some potato soup yesterday and keep it down.  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  She has not had any bowel movement except for some associated with anemia.  She does not  report passing any gas.  No fevers or chills.  She has already had conversation with Dr. Ebbie about the potential surgery, right mastectomy and is hoping that she does not need any adjuvant radiation.   MEDICAL HISTORY:  Past Medical History:  Diagnosis Date   Breast mass, right    Cancer (HCC) 10/2023   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 states she was given gabapentin  and fentanyl  with breast surgery and was diff to wake up   Depression    History of kidney stones  Hypothyroidism    Mitral valve prolapse    Nausea & vomiting 03/10/2024   Sleep apnea    HAS MILD OSA, NO CPAP NEEDED    SURGICAL HISTORY: Past Surgical History:  Procedure Laterality Date   APPENDECTOMY     BREAST BIOPSY Right 09/16/2022   MM RT BREAST BX W LOC DEV 1ST LESION IMAGE BX SPEC STEREO GUIDE 09/16/2022 [facility]   BREAST  BIOPSY  12/06/2022   MM RT RADIOACTIVE SEED LOC MAMMO GUIDE 12/06/2022 [facility]   BREAST BIOPSY Left 11/16/2023   US  LT RADIOACTIVE SEED LOC 11/16/2023 [facility]   BREAST BIOPSY  11/16/2023   MM LT RADIOACTIVE SEED EA ADD LESION LOC MAMMO GUIDE 11/16/2023 [facility]   BREAST BIOPSY Right 04/01/2024   US  RT BREAST BX W LOC DEV 1ST LESION IMG BX SPEC US  GUIDE 04/01/2024 [facility]   BREAST EXCISIONAL BIOPSY Right 2018   BREAST LUMPECTOMY WITH RADIOACTIVE SEED AND SENTINEL LYMPH NODE BIOPSY Left 11/20/2023   Procedure: LEFT BREAST SEED GUIDED LUMPECTOMY, LEFT AXILLARY SENTINEL NODE BIOPSY;  Surgeon: Ebbie Cough, MD;  Location: [facility];  Service: General;  Laterality: Left;  PEC BLOCK   CHOLECYSTECTOMY     CYSTOSCOPY W/ RETROGRADES     KYPHOPLASTY N/A 10/05/2021   Procedure: LUMBAR ONE KYPHOPLASTY;  Surgeon: Cheryle Debby LABOR, MD;  Location: [facility];  Service: Neurosurgery;  Laterality: N/A;   NECK SURGERY     PORTACATH PLACEMENT Right 10/12/2023   Procedure: INSERTION PORT-A-CATH WITH GUIDED ULTRASOUND;  Surgeon: Ebbie Cough, MD;  Location: [facility];  Service: General;  Laterality: Right;   RADIOACTIVE SEED GUIDED AXILLARY SENTINEL LYMPH NODE Left 11/20/2023   Procedure: LEFT AXILLARY NODE SEED GUIDED EXCISION;  Surgeon: Ebbie Cough, MD;  Location: [facility];  Service: General;  Laterality: Left;   RADIOACTIVE SEED GUIDED EXCISIONAL BREAST BIOPSY Right 12/12/2017   Procedure: RIGHT RADIOACTIVE SEED GUIDED EXCISIONAL BREAST BIOPSY ERAS PATHWAY;  Surgeon: Ebbie Cough, MD;  Location: [facility];  Service: General;  Laterality: Right;  LMA   RADIOACTIVE SEED GUIDED EXCISIONAL BREAST BIOPSY Right 12/07/2022   Procedure: RADIOACTIVE SEED GUIDED EXCISIONAL RIGHT BREAST BIOPSY;  Surgeon: Ebbie Cough, MD;  Location: [facility];  Service: General;  Laterality: Right;    SHOULDER SURGERY     SIMPLE MASTECTOMY WITH AXILLARY SENTINEL NODE BIOPSY Left 12/12/2023   Procedure: LEFT  MASTECTOMY;  Surgeon: Ebbie Cough, MD;  Location: [facility];  Service: General;  Laterality: Left;   TONSILLECTOMY     TOTAL HIP ARTHROPLASTY Left 06/04/2021   Procedure: TOTAL HIP ARTHROPLASTY ANTERIOR APPROACH;  Surgeon: Yvone Rush, MD;  Location: [facility];  Service: Orthopedics;  Laterality: Left;   TRIGGER FINGER RELEASE Right 05/18/2015   Procedure: RIGHT  LONG FINGER TRIGGER RELEASE ;  Surgeon: Alm Hummer, MD;  Location: [facility];  Service: Orthopedics;  Laterality: Right;    SOCIAL HISTORY: Social History   Socioeconomic History   Marital status: Married    Spouse name: Not on file   Number of children: Not on file   Years of education: Not on file   Highest education level: Not on file  Occupational History   Not on file  Tobacco Use   Smoking status: Never   Smokeless tobacco: Never  Vaping Use   Vaping status: Never Used  Substance and Sexual Activity   Alcohol  use: No   Drug use: No  Sexual activity: Not Currently    Birth control/protection: Post-menopausal  Other Topics Concern   Not on file  Social History Narrative   Not on file   Social Drivers of Health   Financial Resource Strain: Low Risk  (01/01/2024)   [facility]   Overall Financial Resource Strain (CARDIA)    Difficulty of Paying Living Expenses: Not hard at all  Food Insecurity: No Food Insecurity (04/02/2024)   Hunger Vital Sign    Worried About Running Out of Food in the Last Year: Never true    Ran Out of Food in the Last Year: Never true  Transportation Needs: No Transportation Needs (04/02/2024)   PRAPARE - Administrator, Civil Service (Medical): No    Lack of Transportation (Non-Medical): No  Physical Activity: Inactive (04/24/2023)   Received from [facility]   Exercise Vital Sign    On average, how many days  per week do you engage in moderate to strenuous exercise (like a brisk walk)?: 0 days    On average, how many minutes do you engage in exercise at this level?: 10 min  Stress: No Stress Concern Present (04/24/2023)   Received from [facility] - Occupational Stress Questionnaire    Feeling of Stress : Not at all  Social Connections: Socially Integrated (04/02/2024)   Social Connection and Isolation Panel    Frequency of Communication with Friends and Family: More than three times a week    Frequency of Social Gatherings with Friends and Family: More than three times a week    Attends Religious Services: More than 4 times per year    Active Member of [employer] or Organizations: Yes    Attends Banker Meetings: More than 4 times per year    Marital Status: Married  Catering manager Violence: Not At Risk (04/02/2024)   Humiliation, Afraid, Rape, and Kick questionnaire    Fear of Current or Ex-Partner: No    Emotionally Abused: No    Physically Abused: No    Sexually Abused: No    FAMILY HISTORY: Family History  Problem Relation Age of Onset   Breast cancer Maternal Aunt        69s    ALLERGIES:  is allergic to gadolinium derivatives, other, valium, paxlovid [nirmatrelvir-ritonavir], doxycycline , fentanyl , iohexol , macrodantin, red dye #40 (allura red), rocephin [ceftriaxone], and zithromax [azithromycin].  MEDICATIONS:  Current Facility-Administered Medications  Medication Dose Route Frequency Provider Last Rate Last Admin   acetaminophen  (TYLENOL ) tablet 650 mg  650 mg Oral Q6H PRN Howerter, Justin B, DO       Or   acetaminophen  (TYLENOL ) suppository 650 mg  650 mg Rectal Q6H PRN Howerter, Justin B, DO       butalbital -acetaminophen -caffeine  (FIORICET) 50-325-40 MG per tablet 1 tablet  1 tablet Oral BID PRN Howerter, Justin B, DO       Chlorhexidine  Gluconate Cloth 2 % PADS 6 each  6 each Topical Daily Lue Elsie BROCKS, MD   6  each at 04/03/24 0927   feeding supplement (ENSURE PLUS HIGH PROTEIN) liquid 237 mL  237 mL Oral BID BM Leotis Bogus, MD   237 mL at 04/03/24 0927   hyaluronate sodium (RADIAPLEXRX) gel   Topical BID Keyetta Hollingworth, MD   Given at 04/03/24 9071   levothyroxine  (SYNTHROID ) tablet 75 mcg  75 mcg Oral Q0600 Howerter, Justin B, DO       loratadine  (CLARITIN ) tablet 10 mg  10 mg Oral Daily Howerter, Justin B, DO       melatonin tablet 3 mg  3 mg Oral QHS PRN Howerter, Justin B, DO       montelukast  (SINGULAIR ) tablet 10 mg  10 mg Oral QHS Howerter, Justin B, DO       morphine  (PF) 4 MG/ML injection 4 mg  4 mg Intravenous Q3H PRN Howerter, Justin B, DO   4 mg at 04/03/24 1248   naloxone  (NARCAN ) injection 0.4 mg  0.4 mg Intravenous PRN Howerter, Justin B, DO       OLANZapine (ZYPREXA) tablet 2.5 mg  2.5 mg Oral QHS Howerter, Justin B, DO       ondansetron  (ZOFRAN ) injection 4 mg  4 mg Intravenous Q6H PRN Howerter, Justin B, DO   4 mg at 04/03/24 1053   pantoprazole  (PROTONIX ) injection 40 mg  40 mg Intravenous Q12H Howerter, Justin B, DO   40 mg at 04/03/24 0925   polyethylene glycol (MIRALAX  / GLYCOLAX ) packet 17 g  17 g Oral BID Lue Elsie BROCKS, MD       potassium chloride  (KLOR-CON ) packet 40 mEq  40 mEq Oral Once Leotis Bogus, MD       potassium chloride  10 mEq in 100 mL IVPB  10 mEq Intravenous Q1 Hr x 3 Leotis, Pardeep, MD 100 mL/hr at 04/03/24 1538 10 mEq at 04/03/24 1538   prochlorperazine  (COMPAZINE ) injection 10 mg  10 mg Intravenous Q6H PRN Howerter, Justin B, DO   10 mg at 04/02/24 2138   sertraline  (ZOLOFT ) tablet 50 mg  50 mg Oral Daily Howerter, Justin B, DO       sodium chloride  flush (NS) 0.9 % injection 10-40 mL  10-40 mL Intracatheter Q12H Lue Elsie BROCKS, MD   10 mL at 04/03/24 9073   sodium chloride  flush (NS) 0.9 % injection 10-40 mL  10-40 mL Intracatheter PRN Lue Elsie BROCKS, MD        REVIEW OF SYSTEMS:   Constitutional: Denies fevers, chills or abnormal  night sweats Eyes: Denies blurriness of vision, double vision or watery eyes Ears, nose, mouth, throat, and face: Denies mucositis or sore throat Respiratory: Denies cough, dyspnea or wheezes Cardiovascular: Denies palpitation, chest discomfort or lower extremity swelling Gastrointestinal:  Denies nausea, heartburn or change in bowel habits Skin: Denies abnormal skin rashes Lymphatics: Denies new lymphadenopathy or easy bruising Neurological:Denies numbness, tingling or new weaknesses Behavioral/Psych: Mood is stable, no new changes  Breast: As mentioned above All other systems were reviewed with the patient and are negative.  PHYSICAL EXAMINATION: ECOG PERFORMANCE STATUS: 0 - Asymptomatic  BP (!) 147/80 (BP Location: Right Arm)   Pulse 91   Temp 98.6 F (37 C) (Oral)   Resp 18   Ht 5' 5 (1.651 m)   Wt 173 lb (78.5 kg)   SpO2 94%   BMI 28.79 kg/m    GENERAL appears well. No acute distress. Rad dermatitis with desquamation of skin left chest wall  LABORATORY DATA:  I have reviewed the data as listed Lab Results  Component Value Date   WBC 5.3 04/03/2024   HGB 13.8 04/03/2024   HCT 41.6 04/03/2024   MCV 90.2 04/03/2024   PLT 131 (L) 04/03/2024   Lab Results  Component Value Date   NA 135 04/03/2024   K 2.9 (L) 04/03/2024   CL 104 04/03/2024   CO2 22 04/03/2024    RADIOGRAPHIC STUDIES: I have personally reviewed the radiological reports and agreed with the  findings in the report.  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   She is now admitted with intractable nausea and vomiting.  She is starting to feel better with the IV nausea medication and was able to eat some potato soup yesterday.  She is hoping to be able to stay inpatient until she is able to eat  without retching or nausea or vomiting.  With regards to pain, she is on morphine  every 3 hours as needed, this is controlling the pain.  She is hoping that she does not have to do adjuvant radiation for the right-sided breast cancer.  It is very unfortunate that we are now diagnosed her with right-sided invasive ductal carcinoma as well, pending prognostics.  We are planning to proceed with a right mastectomy outpatient in the next couple weeks, Dr. Ebbie has already spoken to the patient.  Will also plan outpatient PET/CT to rule out metastatic disease.  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  Given the low suspicion for metastatic disease to the brain causing intractable nausea and vomiting, have sent an in basket message to the radiation oncology team to see if they will go ahead and order their preferred modality of imaging .  She was strongly encouraged to contact the office with any new questions or concerns and she expressed understanding   Total time spent: 35 min  All questions were answered. The patient knows to call the clinic with any problems, questions or concerns.    Amber Stalls, MD 03/29/24

## 2024-04-03 NOTE — Progress Notes (Signed)
   04/03/24 0958  TOC Brief Assessment  Insurance and Status Reviewed  Patient has primary care physician Yes Oletta, Jon CROME, MD)  Home environment has been reviewed yes from home  Prior level of function: Independent  Prior/Current Home Services No current home services  Social Drivers of Health Review SDOH reviewed no interventions necessary  Readmission risk has been reviewed Yes  Transition of care needs no transition of care needs at this time

## 2024-04-03 NOTE — Plan of Care (Signed)
   Problem: Education: Goal: Knowledge of General Education information will improve Description Including pain rating scale, medication(s)/side effects and non-pharmacologic comfort measures Outcome: Progressing   Problem: Health Behavior/Discharge Planning: Goal: Ability to manage health-related needs will improve Outcome: Progressing

## 2024-04-03 NOTE — Evaluation (Signed)
 Physical Therapy Evaluation Patient Details Name: Rebecca Steele MRN: [id number] DOB: 15-Dec-1950 Today's Date: 04/03/2024  History of Present Illness  Pt is a 73 y/o F admitted on 04/01/24 after presenting with c/o intractable N&V of unclear etiology. PMH: L sided breast CA undergoing chemotherapy & radiation, chronic diastolic heart failure, acquired hypothyroidism  Clinical Impression  Pt seen for PT evaluation with pt received asleep in bed, easily awakened & agreeable to tx. Pt reports prior to admission she was independent without AD, driving, denies falls. On this date,pt is mod I<>independent for all mobility tasks, no LOB noted. Pt reports she just feels weaker than normal. Encouraged pt to walk as much as she could tolerate each day. At this time, pt does not require acute PT services. PT to complete current orders, please re-consult if new needs arise.        If plan is discharge home, recommend the following:     Can travel by private vehicle        Equipment Recommendations None recommended by PT  Recommendations for Other Services       Functional Status Assessment Patient has not had a recent decline in their functional status     Precautions / Restrictions Precautions Precautions: None Restrictions Weight Bearing Restrictions Per Provider Order: No      Mobility  Bed Mobility Overal bed mobility: Modified Independent (supine>sit with HOB elevated)                  Transfers Overall transfer level: Independent Equipment used: None               General transfer comment: sit>stand from EOB, toilet without assistance.    Ambulation/Gait Ambulation/Gait assistance: Independent, Modified independent (Device/Increase time) Gait Distance (Feet): 300 Feet Assistive device: None Gait Pattern/deviations: Step-through pattern, Decreased stride length Gait velocity: decreased     General Gait Details: Initially holding to furniture in room when  ambulating to bathroom 2/2 just waking up from long nap.  Stairs            Wheelchair Mobility     Tilt Bed    Modified Rankin (Stroke Patients Only)       Balance Overall balance assessment: Needs assistance Sitting-balance support: Feet supported Sitting balance-Leahy Scale: Normal     Standing balance support: No upper extremity supported, During functional activity Standing balance-Leahy Scale: Good                               Pertinent Vitals/Pain Pain Assessment Pain Assessment: No/denies pain    Home Living Family/patient expects to be discharged to:: Private residence (Senior living apartment) Living Arrangements: Spouse/significant other Available Help at Discharge: Family Type of Home: Apartment Home Access: Level entry       Home Layout: One level Home Equipment: Agricultural consultant (2 wheels);Rollator (4 wheels);Cane - single point      Prior Function Prior Level of Function : Independent/Modified Independent;Driving             Mobility Comments: Independent without AD, denies falls in the past 6 months, driving. ADLs Comments: Independent.     Extremity/Trunk Assessment   Upper Extremity Assessment Upper Extremity Assessment: Overall WFL for tasks assessed    Lower Extremity Assessment Lower Extremity Assessment: Overall WFL for tasks assessed;Generalized weakness       Communication   Communication Communication: No apparent difficulties    Cognition Arousal: Alert Behavior During  Therapy: WFL for tasks assessed/performed   PT - Cognitive impairments: No apparent impairments                         Following commands: Intact       Cueing Cueing Techniques: Verbal cues     General Comments General comments (skin integrity, edema, etc.): Toileted without assistance.    Exercises     Assessment/Plan    PT Assessment Patient does not need any further PT services  PT Problem List          PT Treatment Interventions      PT Goals (Current goals can be found in the Care Plan section)  Acute Rehab PT Goals Patient Stated Goal: feel better PT Goal Formulation: All assessment and education complete, DC therapy Time For Goal Achievement: 04/17/24 Potential to Achieve Goals: Good    Frequency       Co-evaluation               AM-PAC PT 6 Clicks Mobility  Outcome Measure Help needed turning from your back to your side while in a flat bed without using bedrails?: None Help needed moving from lying on your back to sitting on the side of a flat bed without using bedrails?: None Help needed moving to and from a bed to a chair (including a wheelchair)?: None Help needed standing up from a chair using your arms (e.g., wheelchair or bedside chair)?: None Help needed to walk in hospital room?: None Help needed climbing 3-5 steps with a railing? : None 6 Click Score: 24    End of Session   Activity Tolerance: Patient tolerated treatment well Patient left: in bed;with call bell/phone within reach;with family/visitor present Nurse Communication:  (pt noted to be bleeding on L shoulder when she got OOB)      Time: 8491-8477 PT Time Calculation (min) (ACUTE ONLY): 14 min   Charges:   PT Evaluation $PT Eval Low Complexity: 1 Low   PT General Charges $$ ACUTE PT VISIT: 1 Visit         Richerd Pinal, PT, DPT 04/03/24, 3:33 PM   Richerd CHRISTELLA Pinal 04/03/2024, 3:31 PM

## 2024-04-04 ENCOUNTER — Other Ambulatory Visit: Payer: Self-pay | Admitting: Radiology

## 2024-04-04 ENCOUNTER — Ambulatory Visit

## 2024-04-04 ENCOUNTER — Other Ambulatory Visit: Payer: Self-pay

## 2024-04-04 ENCOUNTER — Ambulatory Visit
Admission: RE | Admit: 2024-04-04 | Discharge: 2024-04-04 | Disposition: A | Discharge status: Home / Self Care | Source: Ambulatory Visit | Attending: Radiation Oncology | Admitting: Radiation Oncology

## 2024-04-04 DIAGNOSIS — R112 Nausea with vomiting, unspecified: Secondary | ICD-10-CM | POA: Diagnosis not present

## 2024-04-04 LAB — COMPREHENSIVE METABOLIC PANEL WITH GFR
ALT: 28 U/L (ref 0–44)
AST: 33 U/L (ref 15–41)
Albumin: 3.7 g/dL (ref 3.5–5.0)
Alkaline Phosphatase: 77 U/L (ref 38–126)
Anion gap: 9 (ref 5–15)
BUN: 8 mg/dL (ref 8–23)
CO2: 23 mmol/L (ref 22–32)
Calcium: 9.6 mg/dL (ref 8.9–10.3)
Chloride: 104 mmol/L (ref 98–111)
Creatinine, Ser: 0.95 mg/dL (ref 0.44–1.00)
GFR, Estimated: >60 mL/min (ref >60)
Glucose, Bld: 94 mg/dL (ref 70–99)
Potassium: 3.5 mmol/L (ref 3.5–5.1)
Sodium: 136 mmol/L (ref 135–145)
Total Bilirubin: 1.9 mg/dL — ABNORMAL HIGH (ref 0.0–1.2)
Total Protein: 6.8 g/dL (ref 6.5–8.1)

## 2024-04-04 LAB — RAD ONC ARIA SESSION SUMMARY
Course Elapsed Days: 45
Plan Fractions Treated to Date: 14
Plan Fractions Treated to Date: 28
Plan Prescribed Dose Per Fraction: 1.8 Gy
Plan Prescribed Dose Per Fraction: 1.8 Gy
Plan Total Fractions Prescribed: 14
Plan Total Fractions Prescribed: 28
Plan Total Prescribed Dose: 25.2 Gy
Plan Total Prescribed Dose: 50.4 Gy
Reference Point Dosage Given to Date: 25.2 Gy
Reference Point Dosage Given to Date: 50.4 Gy
Reference Point Session Dosage Given: 1.8 Gy
Reference Point Session Dosage Given: 1.8 Gy
Session Number: 28

## 2024-04-04 LAB — CBC
HCT: 43.7 % (ref 36.0–46.0)
Hemoglobin: 14.4 g/dL (ref 12.0–15.0)
MCH: 30.1 pg (ref 26.0–34.0)
MCHC: 33 g/dL (ref 30.0–36.0)
MCV: 91.4 fL (ref 80.0–100.0)
Platelets: 157 10*3/uL (ref 150–400)
RBC: 4.78 MIL/uL (ref 3.87–5.11)
RDW: 15 % (ref 11.5–15.5)
WBC: 5.6 10*3/uL (ref 4.0–10.5)
nRBC: 0 % (ref 0.0–0.2)

## 2024-04-04 MED ORDER — DIPHENHYDRAMINE HCL 50 MG/ML IJ SOLN: 50.0000 mg | Freq: Once | INTRAMUSCULAR | Status: DC

## 2024-04-04 MED ORDER — DIPHENHYDRAMINE HCL 50 MG/ML IJ SOLN
50.0000 mg | Freq: Once | INTRAMUSCULAR | Status: AC
  Administered 2024-04-04: 50 mg via INTRAVENOUS
  Filled 2024-04-04: qty 1

## 2024-04-04 MED ORDER — OXYCODONE HCL 5 MG PO TABS: 5.0000 mg | ORAL_TABLET | Freq: Four times a day (QID) | ORAL | 0 refills | Status: AC | PRN

## 2024-04-04 MED ORDER — PREDNISONE 50 MG PO TABS
50.0000 mg | ORAL_TABLET | Freq: Four times a day (QID) | ORAL | Status: AC
  Administered 2024-04-04 (×3): 50 mg via ORAL
  Filled 2024-04-04 (×3): qty 1

## 2024-04-04 NOTE — Progress Notes (Signed)
 HPI:  Patient was admitted to the hospital on 04/01/2024 for intractable nausea. She states this has been going on for approximately 6 months and uncontrolled with oral medication. She is starting to feel better on IV medication and was able to tolerate soup yesterday. She has not received radiation treatments since 07/27. She complains of pain in her left axilla and skin peeling. She is unsure if she would like to continue radiation treatments at this time. She was given morphine  which helped with her pain, but has not received any for the past 2 days. She has been applying Radiaplex BID, and denies any sulfa  allergy. She was recently diagnosed with right-sided IDC, prognostics pending. Dr. Ebbie is recommending a right mastectomy.   PE:  Erythema within the treatment field with skin peeling and moist desquamation at the left axilla.   A/P: 73 yo female with ypT2N1M0, grade 3, HER2 amplified, ER/PR- IDC of the left breast currently undergoing adjuvant radiation treatment presenting with a new diagnosis of right-sided IDC and intractable nausea.   Given her 74-month history of nausea and current breast cancer, recommend MRI to rule-out brain mets. Patient has an anaphylaxis allergy to gadolinium, so we will proceed with a CT of the head with contrast.   Recommend proceeding with radiation treatment as prescribed. She has two treatments left before starting her boost, which will no longer target the axilla. Patient was given silvadene today and has been prescribed Morphine  for pain control. Prescription for oxycodone  has been sent to her outpatient pharmacy  to pick-up after discharge. I will share this with the inpatient team. Patient expressed understanding and is in agreement with the stated plan.   Patient recently diagnosed with right-sided IDC. Prognostics are pending. Dr. Ebbie has recommended a right mastectomy. We could consider foregoing radiation treatment, pending final pathology and  work-up. We will defer to medical oncology for full staging work-up.     Leeroy Due, PA-C

## 2024-04-04 NOTE — Progress Notes (Signed)
 PROGRESS NOTE    DAILYNN NANCARROW  FMW:[id number] DOB: 08/31/1951 DOA: 04/01/2024 PCP: Abran Jon CROME, MD   Brief Narrative:  This 73 yrs old female with PMH significant for left-sided breast cancer undergoing chemotherapy (last session 6/17) and radiation, chronic diastolic heart failure, acquired hypothyroidism, who is admitted to [facility] on 04/01/2024 with intractable nausea/vomiting over the past 2 months.   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 Unclear if related to ongoing treatments for known left breast cancer. Patient reports no bowel movement in 1 week -concern for underlying gastroparesis, p.o. supportive measures including MiraLAX  and Senokot as well as as needed enemas until bowel movement.  Previous abdominal x-ray showed no notable obstructive pathology. Nausea and vomiting is improving,  She is able to eat soft food.   Hypercalcemia / Hypophosphatemia, unclear etiology: Unclear etiology, mildly elevated over last few labs. Would suspect hemoconcentration during this hospitalization but given prior outpatient labs was mildly elevated. PTH pending, if low consider vitamin D/PTHrH testing to evaluate further. Would suspect hypercalcemia of malignancy given patient's established left breast cancer. Electrolytes corrected.   L Breast  CA: Radiation dermatitis: Established with Dr Loretha -appreciate their insight and recommendations. Continue supportive care,  topical ointments on irritation / skin changes left chest wall. General surgery will proceed with mastectomy/sn biopsy likely in a couple weeks once she recovers from current episode.   Anxiety/depression unspecified: Not currently on any regimen at home, reported allergy to Valium complicates treatment course. Discussed possible initiation of SSRI/SNRI given patient's diagnosis, prognosis and ongoing symptoms   HFpEF -chronic - not in acute exacerbation.   Acquired hypothyroidism: Resume home supplementation once p.o. is established -if unable to tolerate p.o. in the next 24 hours we will transition to IV.   Rhinitis, allergic: - Continue supportive care.   DVT prophylaxis: SCDs Code Status: Full code Family Communication: No family at bed side. Disposition Plan:    Status is: Inpatient Remains inpatient appropriate because: Severity of illness   Consultants:  Oncology  Procedures:  Antimicrobials:  Anti-infectives (From admission, onward)    None       Subjective: Patient was seen and examined at bedside.  Patient reports feeling better. Patient reports feeling better, Denies Nausea, vomiting,  she is tolerating soft diet now.   Objective: Vitals:   04/03/24 2020 04/04/24 0500 04/04/24 0529 04/04/24 1402  BP: (!) 147/78  127/72 134/77  Pulse: 88  82 82  Resp: 16  15   Temp: 98.7 F (37.1 C)  98 F (36.7 C) 98.6 F (37 C)  TempSrc: Oral  Oral Oral  SpO2: 97%  94% 97%  Weight:  75.8 kg    Height:        Intake/Output Summary (Last 24 hours) at 04/04/2024 1407 Last data filed at 04/04/2024 1100 Gross per 24 hour  Intake 590.58 ml  Output --  Net 590.58 ml  Filed Weights   04/02/24 1459 04/03/24 0500 04/04/24 0500  Weight: 77.1 kg 78.5 kg 75.8 kg    Examination:  General exam: Appears calm and comfortable, not in any acute  distress. Respiratory system:  CTA Bilaterally. Respiratory effort normal. RR 16 Cardiovascular system: S1 & S2 heard, RRR. No JVD, murmurs, rubs, gallops or clicks. No pedal edema. Gastrointestinal system: Abdomen is non distended, soft and non tender.  Normal bowel sounds heard. Central nervous system: Alert and oriented X 3. No focal neurological deficits. Extremities: No edema, no cyanosis, no clubbing Skin: No rashes, lesions or ulcers Psychiatry: Judgement and insight appear normal. Mood & affect appropriate.     Data Reviewed: I have personally reviewed following labs and imaging studies  CBC: Recent Labs  Lab 03/28/24 1411 04/01/24 1710 04/02/24 0521 04/03/24 0500 04/04/24 0500  WBC 6.4 6.8 5.3 5.3 5.6  NEUTROABS 4.6 4.8 3.5  --   --   HGB 15.1* 15.6* 13.9 13.8 14.4  HCT 42.8 45.7 41.4 41.6 43.7  MCV 85.4 87.5 90.6 90.2 91.4  PLT 157 157 130* 131* 157   Basic Metabolic Panel: Recent Labs  Lab 03/28/24 1411 04/01/24 1710 04/02/24 0521 04/03/24 0500 04/04/24 0500  NA 139 137 138 135 136  K 3.0* 2.7* 3.3* 2.9* 3.5  CL 102 101 104 104 104  CO2 26 19* 21* 22 23  GLUCOSE 101* 106* 87 101* 94  BUN 8 14 14 10 8   CREATININE 0.97 0.93 0.99 0.92 0.95  CALCIUM  10.4* 10.4* 9.5 9.0 9.6  MG 1.9 2.0 2.1  --   --   PHOS  --  1.7* 3.4  --   --    GFR: Estimated Creatinine Clearance: 54.5 mL/min (by C-G formula based on SCr of 0.95 mg/dL). Liver Function Tests: Recent Labs  Lab 03/28/24 1411 04/01/24 1710 04/02/24 0521 04/03/24 0500 04/04/24 0500  AST 45* 43* 35 31 33  ALT 29 35 29 27 28   ALKPHOS 93 83 72 78 77  BILITOT 1.9* 2.2* 2.2* 2.3* 1.9*  PROT 7.1 7.0 6.1* 6.3* 6.8  ALBUMIN  4.3 3.9 3.5 3.6 3.7   Recent Labs  Lab 04/01/24 1710 04/02/24 0521  LIPASE 37 39   No results for input(s): AMMONIA  in the last 168 hours. Coagulation Profile: Recent Labs  Lab 04/02/24 0521  INR 1.0   Cardiac Enzymes: No results for input(s): CKTOTAL, CKMB,  CKMBINDEX, TROPONINI in the last 168 hours. BNP (last 3 results) No results for input(s): PROBNP in the last 8760 hours. HbA1C: No results for input(s): HGBA1C in the last 72 hours. CBG: No results for input(s): GLUCAP in the last 168 hours. Lipid Profile: No results for input(s): CHOL, HDL, LDLCALC, TRIG, CHOLHDL, LDLDIRECT in the last 72 hours. Thyroid  Function Tests: Recent Labs    04/01/24 2203  TSH 3.437   Anemia Panel: No results for input(s): VITAMINB12, FOLATE, FERRITIN, TIBC, IRON, RETICCTPCT in the last 72 hours. Sepsis Labs: No results for input(s): PROCALCITON, LATICACIDVEN in the last 168 hours.  No results found for this or any previous visit (from the past 240 hours).   Radiology Studies: No results found.   Scheduled Meds:  Chlorhexidine  Gluconate Cloth  6 each Topical Daily   diphenhydrAMINE   50 mg Intravenous Once   feeding supplement  237 mL Oral BID BM   hyaluronate sodium   Topical BID   levothyroxine   75 mcg Oral Q0600   loratadine   10 mg Oral Daily   montelukast   10 mg  Oral QHS   OLANZapine  2.5 mg Oral QHS   pantoprazole  (PROTONIX ) IV  40 mg Intravenous Q12H   polyethylene glycol  17 g Oral BID   potassium chloride   40 mEq Oral Once   predniSONE   50 mg Oral Q6H   sertraline   50 mg Oral Daily   sodium chloride  flush  10-40 mL Intracatheter Q12H   Continuous Infusions:   LOS: 1 day    Time spent: 35 MINS    Darcel Dawley, MD [facility]   If 7PM-7AM, please contact night-coverage

## 2024-04-04 NOTE — Plan of Care (Signed)

## 2024-04-04 NOTE — Plan of Care (Signed)

## 2024-04-05 ENCOUNTER — Encounter (HOSPITAL_COMMUNITY): Payer: Self-pay | Admitting: Family Medicine

## 2024-04-05 ENCOUNTER — Ambulatory Visit

## 2024-04-05 ENCOUNTER — Inpatient Hospital Stay (HOSPITAL_COMMUNITY)

## 2024-04-05 DIAGNOSIS — R112 Nausea with vomiting, unspecified: Secondary | ICD-10-CM | POA: Diagnosis not present

## 2024-04-05 LAB — COMPREHENSIVE METABOLIC PANEL WITH GFR
ALT: 30 U/L (ref 0–44)
AST: 32 U/L (ref 15–41)
Albumin: 3.7 g/dL (ref 3.5–5.0)
Alkaline Phosphatase: 82 U/L (ref 38–126)
Anion gap: 12 (ref 5–15)
BUN: 8 mg/dL (ref 8–23)
CO2: 24 mmol/L (ref 22–32)
Calcium: 10 mg/dL (ref 8.9–10.3)
Chloride: 99 mmol/L (ref 98–111)
Creatinine, Ser: 0.97 mg/dL (ref 0.44–1.00)
GFR, Estimated: >60 mL/min (ref >60)
Glucose, Bld: 141 mg/dL — ABNORMAL HIGH (ref 70–99)
Potassium: 3.5 mmol/L (ref 3.5–5.1)
Sodium: 135 mmol/L (ref 135–145)
Total Bilirubin: 1.3 mg/dL — ABNORMAL HIGH (ref 0.0–1.2)
Total Protein: 6.7 g/dL (ref 6.5–8.1)

## 2024-04-05 LAB — CBC
HCT: 42 % (ref 36.0–46.0)
Hemoglobin: 14.2 g/dL (ref 12.0–15.0)
MCH: 30.5 pg (ref 26.0–34.0)
MCHC: 33.8 g/dL (ref 30.0–36.0)
MCV: 90.1 fL (ref 80.0–100.0)
Platelets: 160 K/uL (ref 150–400)
RBC: 4.66 MIL/uL (ref 3.87–5.11)
RDW: 14.8 % (ref 11.5–15.5)
WBC: 5.5 K/uL (ref 4.0–10.5)
nRBC: 0 % (ref 0.0–0.2)

## 2024-04-05 MED ORDER — OXYCODONE HCL 5 MG PO TABS
5.0000 mg | ORAL_TABLET | Freq: Once | ORAL | Status: DC
  Filled 2024-04-05: qty 1

## 2024-04-05 MED ORDER — IOHEXOL 300 MG/ML  SOLN
75.0000 mL | Freq: Once | INTRAMUSCULAR | Status: AC | PRN
  Administered 2024-04-05: 75 mL via INTRAVENOUS

## 2024-04-05 MED ORDER — DIPHENHYDRAMINE HCL 50 MG/ML IJ SOLN
25.0000 mg | Freq: Once | INTRAMUSCULAR | Status: AC
  Administered 2024-04-05: 25 mg via INTRAVENOUS
  Filled 2024-04-05: qty 1

## 2024-04-05 MED ORDER — SODIUM CHLORIDE (PF) 0.9 % IJ SOLN
INTRAMUSCULAR | Status: AC
  Filled 2024-04-05: qty 50

## 2024-04-05 NOTE — Progress Notes (Signed)
 PROGRESS NOTE    Rebecca Steele  FMW:[id number] DOB: 1950-12-03 DOA: 04/01/2024 PCP: Abran Jon CROME, MD   Brief Narrative:  This 73 yrs old female with PMH significant for left-sided breast cancer undergoing chemotherapy (last session 6/17) and radiation, chronic diastolic heart failure, acquired hypothyroidism, who is admitted to [facility] on 04/01/2024 with intractable nausea/vomiting over the past 2 months.   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 Unclear if related to ongoing treatments for known left breast cancer. Patient reports no bowel movement in 1 week -concern for underlying gastroparesis, Previous abdominal x-ray showed no notable obstructive pathology. Nausea and vomiting is improving,  She is able to eat soft food.   Hypercalcemia / Hypophosphatemia, unclear etiology: Unclear etiology, mildly elevated over last few labs. Would suspect hemoconcentration during this hospitalization but given prior outpatient labs was mildly elevated. PTH pending, if low consider vitamin D/PTHrH testing to evaluate further. Would suspect hypercalcemia of malignancy given patient's established left breast cancer. Electrolytes corrected.   L Breast CA: Radiation dermatitis: Established with Dr Loretha -appreciate their insight and  recommendations. Continue supportive care,  topical ointments on irritation / skin changes left chest wall. General surgery will proceed with mastectomy/sn biopsy likely in a couple weeks once she recovers from current episode.   Anxiety/depression unspecified: Not currently on any regimen at home, reported allergy to Valium complicates treatment course. Discussed possible initiation of SSRI/SNRI given patient's diagnosis, prognosis and ongoing symptoms   HFpEF -chronic - not in acute exacerbation.   Acquired hypothyroidism: Resume home levothyroxine .   Rhinitis, allergic: - Continue supportive care.   DVT prophylaxis: SCDs Code Status: Full code Family Communication: No family at bed side. Disposition Plan:    Status is: Inpatient Remains inpatient appropriate because: Severity of illness   Consultants:  Oncology  Procedures:  Antimicrobials:  Anti-infectives (From admission, onward)    None       Subjective: Patient was seen and examined at bedside.  Patient reports feeling better.  She has tolerated soft diet.  Objective: Vitals:   04/04/24 0529 04/04/24 1402 04/04/24 2125 04/05/24 0654  BP: 127/72 134/77 (!) 161/86 131/73  Pulse: 82 82 93 78  Resp: 15  18 18   Temp: 98 F (36.7 C) 98.6 F (37 C) 98.8 F (37.1 C) 98.1 F (36.7 C)  TempSrc: Oral Oral Oral Oral  SpO2: 94% 97% 95% 91%  Weight:      Height:       No intake or output data in the 24 hours ending 04/05/24 1134  Filed Weights   04/02/24 1459 04/03/24 0500 04/04/24 0500  Weight: 77.1 kg 78.5 kg 75.8 kg    Examination:  General exam: Appears calm and comfortable, not in any acute distress. Respiratory system:  CTA Bilaterally. Respiratory effort normal. RR 14 Cardiovascular system: S1 & S2 heard, RRR. No JVD, murmurs, rubs, gallops or clicks.  Gastrointestinal  system: Abdomen is non distended, soft and non tender.  Normal bowel sounds heard. Central nervous system: Alert and oriented X 3.  No focal neurological deficits. Extremities: No edema, no cyanosis, no clubbing Skin: No rashes, lesions or ulcers Psychiatry: Judgement and insight appear normal. Mood & affect appropriate.     Data Reviewed: I have personally reviewed following labs and imaging studies  CBC: Recent Labs  Lab 04/01/24 1710 04/02/24 0521 04/03/24 0500 04/04/24 0500 04/05/24 0430  WBC 6.8 5.3 5.3 5.6 5.5  NEUTROABS 4.8 3.5  --   --   --   HGB 15.6* 13.9 13.8 14.4 14.2  HCT 45.7 41.4 41.6 43.7 42.0  MCV 87.5 90.6 90.2 91.4 90.1  PLT 157 130* 131* 157 160   Basic Metabolic Panel: Recent Labs  Lab 04/01/24 1710 04/02/24 0521 04/03/24 0500 04/04/24 0500 04/05/24 0430  NA 137 138 135 136 135  K 2.7* 3.3* 2.9* 3.5 3.5  CL 101 104 104 104 99  CO2 19* 21* 22 23 24   GLUCOSE 106* 87 101* 94 141*  BUN 14 14 10 8 8   CREATININE 0.93 0.99 0.92 0.95 0.97  CALCIUM  10.4* 9.5 9.0 9.6 10.0  MG 2.0 2.1  --   --   --   PHOS 1.7* 3.4  --   --   --    GFR: Estimated Creatinine Clearance: 53.4 mL/min (by C-G formula based on SCr of 0.97 mg/dL). Liver Function Tests: Recent Labs  Lab 04/01/24 1710 04/02/24 0521 04/03/24 0500 04/04/24 0500 04/05/24 0430  AST 43* 35 31 33 32  ALT 35 29 27 28 30   ALKPHOS 83 72 78 77 82  BILITOT 2.2* 2.2* 2.3* 1.9* 1.3*  PROT 7.0 6.1* 6.3* 6.8 6.7  ALBUMIN  3.9 3.5 3.6 3.7 3.7   Recent Labs  Lab 04/01/24 1710 04/02/24 0521  LIPASE 37 39   No results for input(s): AMMONIA  in the last 168 hours. Coagulation Profile: Recent Labs  Lab 04/02/24 0521  INR 1.0   Cardiac Enzymes: No results for input(s): CKTOTAL, CKMB, CKMBINDEX, TROPONINI in the last 168 hours. BNP (last 3 results) No results for input(s): PROBNP in the last 8760 hours. HbA1C: No results for input(s): HGBA1C in the last 72 hours. CBG: No results for input(s): GLUCAP in the last 168 hours. Lipid Profile: No results for input(s): CHOL, HDL, LDLCALC, TRIG, CHOLHDL,  LDLDIRECT in the last 72 hours. Thyroid  Function Tests: No results for input(s): TSH, T4TOTAL, FREET4, T3FREE, THYROIDAB in the last 72 hours.  Anemia Panel: No results for input(s): VITAMINB12, FOLATE, FERRITIN, TIBC, IRON, RETICCTPCT in the last 72 hours. Sepsis Labs: No results for input(s): PROCALCITON, LATICACIDVEN in the last 168 hours.  No results found for this or any previous visit (from the past 240 hours).   Radiology Studies: CT HEAD W & WO CONTRAST ( ) Result Date: 04/05/2024 CLINICAL DATA:  Metastatic disease evaluation EXAM: CT HEAD WITHOUT AND WITH CONTRAST TECHNIQUE: Contiguous axial images were obtained from the base of the skull through the vertex without and with intravenous contrast. RADIATION DOSE REDUCTION: This exam was performed according to the departmental dose-optimization program which includes automated exposure control, adjustment of the mA and/or kV according to patient size and/or use of iterative reconstruction technique. CONTRAST:  75mL OMNIPAQUE  IOHEXOL  300 MG/ML  SOLN COMPARISON:  CT head 10/04/2021. FINDINGS: Brain: No evidence of acute infarction, hemorrhage, hydrocephalus, extra-axial collection or mass lesion/mass effect. No abnormal enhancement. Vascular: No hyperdense vessel or unexpected calcification. Visible vessels are  patent. Skull: No acute fracture. Sinuses/Orbits: Clear sinuses.  No acute orbital findings. Other: No mastoid effusions. IMPRESSION: No evidence of acute intracranial abnormality or metastatic disease. If the patient is able, an MRI head with contrast could provide far more sensitive evaluation for metastatic disease. Electronically Signed   By: Gilmore GORMAN Molt M.D.   On: 04/05/2024 01:58     Scheduled Meds:  Chlorhexidine  Gluconate Cloth  6 each Topical Daily   feeding supplement  237 mL Oral BID BM   hyaluronate sodium   Topical BID   levothyroxine   75 mcg Oral Q0600   loratadine   10 mg Oral Daily    montelukast   10 mg Oral QHS   OLANZapine   2.5 mg Oral QHS   pantoprazole  (PROTONIX ) IV  40 mg Intravenous Q12H   polyethylene glycol  17 g Oral BID   potassium chloride   40 mEq Oral Once   sertraline   50 mg Oral Daily   sodium chloride  flush  10-40 mL Intracatheter Q12H   Continuous Infusions:   LOS: 2 days    Time spent: 35 MINS    Darcel Dawley, MD [facility]   If 7PM-7AM, please contact night-coverage

## 2024-04-05 NOTE — Plan of Care (Signed)
  Problem: Activity: Goal: Risk for activity intolerance will decrease Outcome: Progressing   Problem: Nutrition: Goal: Adequate nutrition will be maintained Outcome: Progressing   Problem: Coping: Goal: Level of anxiety will decrease Outcome: Progressing   Problem: Pain Managment: Goal: General experience of comfort will improve and/or be controlled Outcome: Progressing   Problem: Safety: Goal: Ability to remain free from injury will improve Outcome: Progressing

## 2024-04-06 DIAGNOSIS — R112 Nausea with vomiting, unspecified: Secondary | ICD-10-CM | POA: Diagnosis not present

## 2024-04-06 LAB — COMPREHENSIVE METABOLIC PANEL WITH GFR
ALT: 42 U/L (ref 0–44)
AST: 53 U/L — ABNORMAL HIGH (ref 15–41)
Albumin: 3.5 g/dL (ref 3.5–5.0)
Alkaline Phosphatase: 76 U/L (ref 38–126)
Anion gap: 10 (ref 5–15)
BUN: 10 mg/dL (ref 8–23)
CO2: 28 mmol/L (ref 22–32)
Calcium: 9.6 mg/dL (ref 8.9–10.3)
Chloride: 101 mmol/L (ref 98–111)
Creatinine, Ser: 0.95 mg/dL (ref 0.44–1.00)
GFR, Estimated: >60 mL/min (ref >60)
Glucose, Bld: 95 mg/dL (ref 70–99)
Potassium: 3.3 mmol/L — ABNORMAL LOW (ref 3.5–5.1)
Sodium: 139 mmol/L (ref 135–145)
Total Bilirubin: 1.4 mg/dL — ABNORMAL HIGH (ref 0.0–1.2)
Total Protein: 6 g/dL — ABNORMAL LOW (ref 6.5–8.1)

## 2024-04-06 LAB — CBC
HCT: 41.4 % (ref 36.0–46.0)
Hemoglobin: 13.5 g/dL (ref 12.0–15.0)
MCH: 29.7 pg (ref 26.0–34.0)
MCHC: 32.6 g/dL (ref 30.0–36.0)
MCV: 91.2 fL (ref 80.0–100.0)
Platelets: 182 K/uL (ref 150–400)
RBC: 4.54 MIL/uL (ref 3.87–5.11)
RDW: 15.2 % (ref 11.5–15.5)
WBC: 7.4 K/uL (ref 4.0–10.5)
nRBC: 0 % (ref 0.0–0.2)

## 2024-04-06 MED ORDER — DIPHENHYDRAMINE HCL 25 MG PO CAPS: 25.0000 mg | ORAL_CAPSULE | Freq: Once | ORAL | Status: DC | PRN

## 2024-04-06 MED ORDER — DIPHENHYDRAMINE HCL 50 MG/ML IJ SOLN
25.0000 mg | Freq: Once | INTRAMUSCULAR | Status: AC
  Administered 2024-04-06: 25 mg via INTRAVENOUS
  Filled 2024-04-06: qty 1

## 2024-04-06 MED ORDER — POTASSIUM CHLORIDE 10 MEQ/100ML IV SOLN
10.0000 meq | INTRAVENOUS | Status: AC
  Administered 2024-04-06 (×3): 10 meq via INTRAVENOUS
  Filled 2024-04-06 (×3): qty 100

## 2024-04-06 MED ORDER — POTASSIUM CHLORIDE 20 MEQ PO PACK
40.0000 meq | PACK | Freq: Once | ORAL | Status: DC
  Filled 2024-04-06: qty 2

## 2024-04-06 MED ORDER — PANTOPRAZOLE SODIUM 40 MG PO TBEC
40.0000 mg | DELAYED_RELEASE_TABLET | Freq: Two times a day (BID) | ORAL | Status: DC
  Administered 2024-04-06 – 2024-04-07 (×2): 40 mg via ORAL
  Filled 2024-04-06 (×2): qty 1

## 2024-04-06 NOTE — Progress Notes (Signed)
 PROGRESS NOTE    Rebecca Steele  FMW:[id number] DOB: 1951/05/11 DOA: 04/01/2024 PCP: Abran Jon CROME, MD   Brief Narrative:  This 73 yrs old female with PMH significant for left-sided breast cancer undergoing chemotherapy (last session 6/17) and radiation, chronic diastolic heart failure, acquired hypothyroidism, who is admitted to [facility] on 04/01/2024 with intractable nausea/vomiting over the past 2 months.   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 Unclear if related to ongoing treatments for known left breast cancer. Patient reports no bowel movement in 1 week -concern for underlying gastroparesis, Previous abdominal x-ray showed no notable obstructive pathology. Nausea and vomiting is improving,  She was able to eat soft food yesterday.   Hypercalcemia / Hypophosphatemia, unclear etiology: Unclear etiology, mildly elevated over last few labs. Would suspect hemoconcentration during this hospitalization but given prior outpatient labs was mildly elevated. PTH pending, if low consider vitamin D/PTHrH testing to evaluate further. Would suspect hypercalcemia of malignancy given patient's established left breast cancer. Electrolytes corrected.   L Breast CA: Radiation dermatitis: Established with Dr Loretha -appreciate their insight and  recommendations. Continue supportive care,  topical ointments on irritation / skin changes left chest wall. General surgery will proceed with mastectomy/sn biopsy likely in a couple weeks once she recovers from current episode.   Anxiety/depression unspecified: Not currently on any regimen at home, reported allergy to Valium complicates treatment course. Discussed possible initiation of SSRI/SNRI given patient's diagnosis, prognosis and ongoing symptoms   HFpEF -chronic - not in acute exacerbation.   Acquired hypothyroidism: Resume home levothyroxine .   Rhinitis, allergic: - Continue supportive care.   DVT prophylaxis: SCDs Code Status: Full code Family Communication: No family at bed side. Disposition Plan:    Status is: Inpatient Remains inpatient appropriate because: Severity of illness   Consultants:  Oncology  Procedures:  Antimicrobials:  Anti-infectives (From admission, onward)    None       Subjective: Patient was seen and examined at bedside.  Patient reports feeling better.  She had tolerated soft diet yesterday , feeling nauseous again.  Objective: Vitals:   04/05/24 0654 04/05/24 1421 04/05/24 2113 04/06/24 0539  BP: 131/73 (!) 140/71 (!) 164/77 133/78  Pulse: 78 86 87 78  Resp: 18  18 18   Temp: 98.1 F (36.7 C)  98.5 F (36.9 C) 98.2 F (36.8 C)  TempSrc: Oral  Oral Oral  SpO2: 91% 92% 95% 93%  Weight:      Height:       No intake or output data in the 24 hours ending 04/06/24 1153  Filed Weights   04/02/24 1459 04/03/24 0500 04/04/24 0500  Weight: 77.1 kg 78.5 kg 75.8 kg    Examination:  General exam: Appears calm and comfortable, not in any acute distress. Respiratory system:  CTA Bilaterally. Respiratory effort normal. RR 13 Cardiovascular system: S1 & S2 heard, RRR. No JVD, murmurs, rubs, gallops  or clicks.  Gastrointestinal system: Abdomen is non distended, soft and non tender.  Normal bowel sounds heard. Central nervous system:  Alert and oriented X 3. No focal neurological deficits. Extremities: No edema, no cyanosis, no clubbing Skin: No rashes, lesions or ulcers Psychiatry: Judgement and insight appear normal. Mood & affect appropriate.     Data Reviewed: I have personally reviewed following labs and imaging studies  CBC: Recent Labs  Lab 04/01/24 1710 04/02/24 0521 04/03/24 0500 04/04/24 0500 04/05/24 0430 04/06/24 0515  WBC 6.8 5.3 5.3 5.6 5.5 7.4  NEUTROABS 4.8 3.5  --   --   --   --   HGB 15.6* 13.9 13.8 14.4 14.2 13.5  HCT 45.7 41.4 41.6 43.7 42.0 41.4  MCV 87.5 90.6 90.2 91.4 90.1 91.2  PLT 157 130* 131* 157 160 182   Basic Metabolic Panel: Recent Labs  Lab 04/01/24 1710 04/02/24 0521 04/03/24 0500 04/04/24 0500 04/05/24 0430 04/06/24 0515  NA 137 138 135 136 135 139  K 2.7* 3.3* 2.9* 3.5 3.5 3.3*  CL 101 104 104 104 99 101  CO2 19* 21* 22 23 24 28   GLUCOSE 106* 87 101* 94 141* 95  BUN 14 14 10 8 8 10   CREATININE 0.93 0.99 0.92 0.95 0.97 0.95  CALCIUM  10.4* 9.5 9.0 9.6 10.0 9.6  MG 2.0 2.1  --   --   --   --   PHOS 1.7* 3.4  --   --   --   --    GFR: Estimated Creatinine Clearance: 54.5 mL/min (by C-G formula based on SCr of 0.95 mg/dL). Liver Function Tests: Recent Labs  Lab 04/02/24 0521 04/03/24 0500 04/04/24 0500 04/05/24 0430 04/06/24 0515  AST 35 31 33 32 53*  ALT 29 27 28 30  42  ALKPHOS 72 78 77 82 76  BILITOT 2.2* 2.3* 1.9* 1.3* 1.4*  PROT 6.1* 6.3* 6.8 6.7 6.0*  ALBUMIN  3.5 3.6 3.7 3.7 3.5   Recent Labs  Lab 04/01/24 1710 04/02/24 0521  LIPASE 37 39   No results for input(s): AMMONIA  in the last 168 hours. Coagulation Profile: Recent Labs  Lab 04/02/24 0521  INR 1.0   Cardiac Enzymes: No results for input(s): CKTOTAL, CKMB, CKMBINDEX, TROPONINI in the last 168 hours. BNP (last 3 results) No results for input(s): PROBNP in the last 8760 hours. HbA1C: No results for input(s): HGBA1C in the last 72 hours. CBG: No results for  input(s): GLUCAP in the last 168 hours. Lipid Profile: No results for input(s): CHOL, HDL, LDLCALC, TRIG, CHOLHDL, LDLDIRECT in the last 72 hours. Thyroid  Function Tests: No results for input(s): TSH, T4TOTAL, FREET4, T3FREE, THYROIDAB in the last 72 hours.  Anemia Panel: No results for input(s): VITAMINB12, FOLATE, FERRITIN, TIBC, IRON, RETICCTPCT in the last 72 hours. Sepsis Labs: No results for input(s): PROCALCITON, LATICACIDVEN in the last 168 hours.  No results found for this or any previous visit (from the past 240 hours).   Radiology Studies: CT HEAD W & WO CONTRAST ( ) Result Date: 04/05/2024 CLINICAL DATA:  Metastatic disease evaluation EXAM: CT HEAD WITHOUT AND WITH CONTRAST TECHNIQUE: Contiguous axial images were obtained from the base of the skull through the vertex without and with intravenous contrast. RADIATION DOSE REDUCTION: This exam was performed according to the departmental dose-optimization program which includes automated exposure control, adjustment of the mA and/or kV according to patient size and/or use of iterative reconstruction technique. CONTRAST:  75mL OMNIPAQUE  IOHEXOL  300 MG/ML  SOLN COMPARISON:  CT  head 10/04/2021. FINDINGS: Brain: No evidence of acute infarction, hemorrhage, hydrocephalus, extra-axial collection or mass lesion/mass effect. No abnormal enhancement. Vascular: No hyperdense vessel or unexpected calcification. Visible vessels are patent. Skull: No acute fracture. Sinuses/Orbits: Clear sinuses.  No acute orbital findings. Other: No mastoid effusions. IMPRESSION: No evidence of acute intracranial abnormality or metastatic disease. If the patient is able, an MRI head with contrast could provide far more sensitive evaluation for metastatic disease. Electronically Signed   By: Gilmore GORMAN Molt M.D.   On: 04/05/2024 01:58     Scheduled Meds:  Chlorhexidine  Gluconate Cloth  6 each Topical Daily   feeding  supplement  237 mL Oral BID BM   hyaluronate sodium   Topical BID   levothyroxine   75 mcg Oral Q0600   loratadine   10 mg Oral Daily   montelukast   10 mg Oral QHS   OLANZapine   2.5 mg Oral QHS   pantoprazole   40 mg Oral BID   polyethylene glycol  17 g Oral BID   potassium chloride   40 mEq Oral Once   sertraline   50 mg Oral Daily   sodium chloride  flush  10-40 mL Intracatheter Q12H   Continuous Infusions:  potassium chloride  10 mEq (04/06/24 1115)     LOS: 3 days    Time spent: 35 MINS    Darcel Dawley, MD [facility]   If 7PM-7AM, please contact night-coverage

## 2024-04-06 NOTE — Plan of Care (Signed)

## 2024-04-07 DIAGNOSIS — R112 Nausea with vomiting, unspecified: Secondary | ICD-10-CM | POA: Diagnosis not present

## 2024-04-07 LAB — COMPREHENSIVE METABOLIC PANEL WITH GFR
ALT: 52 U/L — ABNORMAL HIGH (ref 0–44)
AST: 53 U/L — ABNORMAL HIGH (ref 15–41)
Albumin: 3.6 g/dL (ref 3.5–5.0)
Alkaline Phosphatase: 76 U/L (ref 38–126)
Anion gap: 11 (ref 5–15)
BUN: 11 mg/dL (ref 8–23)
CO2: 27 mmol/L (ref 22–32)
Calcium: 9.5 mg/dL (ref 8.9–10.3)
Chloride: 101 mmol/L (ref 98–111)
Creatinine, Ser: 0.93 mg/dL (ref 0.44–1.00)
GFR, Estimated: >60 mL/min (ref >60)
Glucose, Bld: 93 mg/dL (ref 70–99)
Potassium: 3.3 mmol/L — ABNORMAL LOW (ref 3.5–5.1)
Sodium: 139 mmol/L (ref 135–145)
Total Bilirubin: 1.6 mg/dL — ABNORMAL HIGH (ref 0.0–1.2)
Total Protein: 6.4 g/dL — ABNORMAL LOW (ref 6.5–8.1)

## 2024-04-07 LAB — CBC
HCT: 43.1 % (ref 36.0–46.0)
Hemoglobin: 14 g/dL (ref 12.0–15.0)
MCH: 30.1 pg (ref 26.0–34.0)
MCHC: 32.5 g/dL (ref 30.0–36.0)
MCV: 92.7 fL (ref 80.0–100.0)
Platelets: 181 K/uL (ref 150–400)
RBC: 4.65 MIL/uL (ref 3.87–5.11)
RDW: 15.2 % (ref 11.5–15.5)
WBC: 4.9 K/uL (ref 4.0–10.5)
nRBC: 0 % (ref 0.0–0.2)

## 2024-04-07 MED ORDER — POTASSIUM CHLORIDE 10 MEQ/100ML IV SOLN
10.0000 meq | INTRAVENOUS | Status: AC
  Administered 2024-04-07 (×2): 10 meq via INTRAVENOUS
  Filled 2024-04-07 (×2): qty 100

## 2024-04-07 MED ORDER — LACTULOSE 10 GM/15ML PO SOLN
20.0000 g | Freq: Once | ORAL | Status: AC
  Administered 2024-04-07: 20 g via ORAL
  Filled 2024-04-07: qty 30

## 2024-04-07 MED ORDER — POTASSIUM CHLORIDE 10 MEQ/100ML IV SOLN
INTRAVENOUS | Status: AC
  Filled 2024-04-07: qty 100

## 2024-04-07 MED ORDER — POTASSIUM CHLORIDE 20 MEQ PO PACK
40.0000 meq | PACK | Freq: Once | ORAL | Status: DC
  Filled 2024-04-07: qty 2

## 2024-04-07 MED ORDER — POTASSIUM CHLORIDE 10 MEQ/100ML IV SOLN
INTRAVENOUS | Status: AC
  Administered 2024-04-07: 10 meq
  Filled 2024-04-07: qty 100

## 2024-04-07 MED ORDER — HEPARIN SOD (PORK) LOCK FLUSH 100 UNIT/ML IV SOLN
500.0000 [IU] | INTRAVENOUS | Status: AC | PRN
  Administered 2024-04-07: 500 [IU]
  Filled 2024-04-07: qty 5

## 2024-04-07 NOTE — Discharge Instructions (Signed)
 Advised to follow-up with primary care physician in 1 week. Advised to follow-up with oncology as scheduled. Advised to follow-up with general surgery next week for mastectomy.

## 2024-04-07 NOTE — Plan of Care (Signed)

## 2024-04-07 NOTE — Progress Notes (Signed)
 Soap suds enema given with good results. Discharge instructions explained to Rebecca Steele. Next medications due are written on her discharge papers. Ron denies having any pain or nausea. She did eat a pastry for lunch and tolerated it well. Port flushed with 500 Units of Heparin  prior to de accessing it. Family in visiting. Discharged via wheelchair.

## 2024-04-07 NOTE — Plan of Care (Signed)
°  Problem: Health Behavior/Discharge Planning: Goal: Ability to manage health-related needs will improve Outcome: Progressing   Problem: Clinical Measurements: Goal: Ability to maintain clinical measurements within normal limits will improve Outcome: Progressing   Problem: Activity: Goal: Risk for activity intolerance will decrease Outcome: Progressing   Problem: Nutrition: Goal: Adequate nutrition will be maintained Outcome: Progressing   Problem: Elimination: Goal: Will not experience complications related to bowel motility Outcome: Progressing

## 2024-04-07 NOTE — Discharge Summary (Signed)
 Physician Discharge Summary  Rebecca Steele FMW:[id number] DOB: [birth year] DOA: 04/01/2024  PCP: Abran Jon CROME, MD  Admit date: 04/01/2024  Discharge date: 04/07/2024  Admitted From: Home  Disposition:  Home  Recommendations for Outpatient Follow-up:  Follow up with PCP in 1-2 weeks. Please obtain BMP/CBC in one week. Advised to follow-up with oncology as scheduled. Advised to follow-up with general surgery next week for mastectomy.  Home Health: None Equipment/Devices: None  Discharge Condition: Stable CODE STATUS:Full code Diet recommendation: Regular diet  Brief Summary/ Hospital Course: This 73 yrs old female with PMH significant for left-sided breast cancer undergoing chemotherapy (last session 6/17) and radiation, chronic diastolic heart failure, acquired hypothyroidism, who is admitted to [facility] on 04/01/2024 with intractable nausea/vomiting over the past 2 months. Patient was continued on supportive care.She has felt much better, she will continue to follow up radiation oncology , She has tolerated clear liquid diet , tolerated well, advanced to soft diet. She wants to be discharged home.  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 Unclear if related to ongoing treatments for known left breast cancer. Patient reports no bowel movement  in 1 week -concern for underlying gastroparesis, Previous abdominal x-ray showed no notable obstructive pathology. Nausea and vomiting is improving,  She was able to eat soft food yesterday.   Hypercalcemia / Hypophosphatemia, unclear etiology: Unclear etiology, mildly elevated over last few labs. Would suspect hemoconcentration during this hospitalization but given prior outpatient labs was mildly elevated. PTH pending, if low consider vitamin D/PTHrH testing to evaluate further. Would suspect hypercalcemia of malignancy given patient's established left breast cancer. Electrolytes corrected.   L Breast CA: Radiation dermatitis: Established with Dr Loretha -appreciate their insight and recommendations. Continue supportive care,  topical ointments on irritation / skin changes left chest wall. General surgery will proceed with mastectomy/sn biopsy likely in a couple weeks once she recovers from current episode.   Anxiety/depression unspecified: Not currently on any regimen at home, reported allergy to Valium complicates treatment course. Discussed possible initiation of SSRI/SNRI given patient's diagnosis, prognosis and ongoing symptoms   HFpEF -chronic - not in acute exacerbation.   Acquired hypothyroidism: Resume home levothyroxine .   Rhinitis, allergic: - Continue supportive care.  Discharge Instructions  Discharge Instructions     Call MD for:  difficulty breathing, headache or visual disturbances   Complete by: As directed    Call MD for:  persistant dizziness or light-headedness   Complete by: As directed    Call MD for:  persistant nausea and vomiting   Complete by: As directed    Diet - low sodium heart healthy   Complete by: As directed    Diet general   Complete by: As directed    Discharge instructions   Complete by: As directed    Advised to follow-up with primary care physician in 1 week. Advised to follow-up with oncology as scheduled. Advised to follow-up  with general surgery next week for mastectomy.   Discharge wound care:   Complete by: As directed    Follow-up with general surgery as scheduled.   Increase activity slowly   Complete by: As directed       Allergies as of 04/07/2024       Reactions   Gadolinium Derivatives Anaphylaxis, Shortness Of Breath   Pt was given 17ml multihance  for MRI. After about 39min30 seconds she squeezed the ball saying she felt like her throat was closing up. Exam was DC'd and radiologist gave 75mg  benadryl  IV.    Other    Pt states she was given Gabapentin  and Fentanyl  for anesthesia during recent surgery and was unable to wake up for several hours.  Pt states she does not want to have sedation with those medications in the future.   Valium Anaphylaxis   Paxlovid [nirmatrelvir-ritonavir] Hives   Doxycycline  Swelling   Facial swelling Pt has since had without problem   Fentanyl  Other (See Comments)   Slept the whole time for few days   Iohexol  Itching    Desc: Pt. stated she had iv contrast around 25 yrs ago and had itching on her neck.  she took benadryl  and it went away.  The rad recommended premeds and be done tomorrow but the pt.did not want to do that so we did her w/o iv contrast today., Onset Date: [id number]   Macrodantin Nausea And Vomiting   Red Dye #40 (allura Red) Swelling   Facial swelling (cannot take pink or red tablets or capsules)   Rocephin [ceftriaxone] Other (See Comments)   Joint aches   Zithromax [azithromycin] Other (See Comments)   Weakness, cold/clammy, legs trembling        Medication List     STOP taking these medications    doxycycline  100 MG tablet Commonly known as: VIBRA -TABS   potassium chloride  10 MEQ tablet Commonly known as: KLOR-CON  M       TAKE these medications    butalbital -acetaminophen -caffeine  50-325-40 MG tablet Commonly known as: FIORICET Take 1 tablet by mouth 2 (two) times daily as needed for headache or migraine.   levothyroxine  75 MCG  tablet Commonly known as: SYNTHROID  Take 75 mcg by mouth daily before breakfast.   lidocaine -prilocaine  cream Commonly known as: EMLA  Apply to affected area once   loratadine  10 MG tablet Commonly known as: CLARITIN  Take 10 mg by mouth daily.   metoCLOPramide  10 MG tablet Commonly known as: Reglan  Take 1 tablet (10 mg total) by mouth every 6 (six) hours as needed for nausea (nausea/headache).   montelukast  10 MG tablet Commonly known as: SINGULAIR  Take 10 mg by mouth at bedtime.   OLANZapine  2.5 MG tablet Commonly known as: ZyPREXA  Take 1 tablet (2.5 mg total) by mouth at bedtime.   ondansetron  8 MG disintegrating tablet Commonly known as: ZOFRAN -ODT Take 1 tablet (8 mg total) by mouth every 8 (eight) hours as needed for nausea or vomiting.   oxyCODONE  5 MG immediate release tablet Commonly known as: Oxy IR/ROXICODONE  Take 1 tablet (5 mg total) by mouth every 6 (six) hours as needed for up to 5 days for severe pain (pain score 7-10).   pantoprazole  40 MG tablet Commonly known as: PROTONIX  Take 1 tablet (40 mg total) by mouth daily.   prochlorperazine  10 MG tablet Commonly known as: COMPAZINE  Take 1 tablet (10 mg total) by mouth every 6 (six) hours as needed for nausea or vomiting.   sertraline  50 MG tablet Commonly known  as: ZOLOFT  Take 50 mg by mouth daily.   sucralfate  1 GM/10ML suspension Commonly known as: CARAFATE  Take 10 mLs (1 g total) by mouth 4 (four) times daily -  with meals and at bedtime.   traZODone  100 MG tablet Commonly known as: DESYREL  Take 100 mg by mouth at bedtime.               Discharge Care Instructions  (From admission, onward)           Start     Ordered   04/07/24 0000  Discharge wound care:       Comments: Follow-up with general surgery as scheduled.   04/07/24 1010            Follow-up Information     Ebbie Cough, MD Follow up on 04/11/2024.   Specialty: General Surgery Why: arrive at 215 for 230  appt Contact information: [address] [place] [address] [phone]         Abran Jon CROME, MD Follow up in 1 week(s).   Specialty: Internal Medicine Contact information: [address] [place] [address] [phone]                Allergies  Allergen Reactions   Gadolinium Derivatives Anaphylaxis and Shortness Of Breath    Pt was given 17ml multihance  for MRI. After about 26min30 seconds she squeezed the ball saying she felt like her throat was closing up. Exam was DC'd and radiologist gave 75mg  benadryl  IV.    Other     Pt states she was given Gabapentin  and Fentanyl  for anesthesia during recent surgery and was unable to wake up for several hours.  Pt states she does not want to have sedation with those medications in the future.   Valium Anaphylaxis   Paxlovid [Nirmatrelvir-Ritonavir] Hives   Doxycycline  Swelling    Facial swelling Pt has since had without problem   Fentanyl  Other (See Comments)    Slept the whole time for few days   Iohexol  Itching     Desc: Pt. stated she had iv contrast around 25 yrs ago and had itching on her neck.  she took benadryl  and it went away.  The rad recommended premeds and be done tomorrow but the pt.did not want to do that so we did her w/o iv contrast today., Onset Date: [id number]    Macrodantin Nausea And Vomiting   Red Dye #40 (Allura Red) Swelling    Facial swelling (cannot take pink or red tablets or capsules)   Rocephin [Ceftriaxone] Other (See Comments)    Joint aches   Zithromax [Azithromycin] Other (See Comments)    Weakness, cold/clammy, legs trembling    Consultations: Oncology General Surgery Radiation oncology   Procedures/Studies: CT HEAD W & WO CONTRAST ( ) Result Date: 04/05/2024 CLINICAL DATA:  Metastatic disease evaluation EXAM: CT HEAD WITHOUT AND WITH CONTRAST TECHNIQUE: Contiguous axial images were obtained from the base of the skull through the vertex  without and with intravenous contrast. RADIATION DOSE REDUCTION: This exam was performed according to the departmental dose-optimization program which includes automated exposure control, adjustment of the mA and/or kV according to patient size and/or use of iterative reconstruction technique. CONTRAST:  75mL OMNIPAQUE  IOHEXOL  300 MG/ML  SOLN COMPARISON:  CT head 10/04/2021. FINDINGS: Brain: No evidence of acute infarction, hemorrhage, hydrocephalus, extra-axial collection or mass lesion/mass effect. No abnormal enhancement. Vascular: No hyperdense vessel or unexpected calcification. Visible vessels are patent. Skull: No acute  fracture. Sinuses/Orbits: Clear sinuses.  No acute orbital findings. Other: No mastoid effusions. IMPRESSION: No evidence of acute intracranial abnormality or metastatic disease. If the patient is able, an MRI head with contrast could provide far more sensitive evaluation for metastatic disease. Electronically Signed   By: Gilmore GORMAN Molt M.D.   On: 04/05/2024 01:58   US  RT BREAST BX W LOC DEV 1ST LESION IMG BX SPEC US  GUIDE Addendum Date: 04/02/2024 ADDENDUM REPORT: 04/02/2024 15:03 ADDENDUM: Pathology revealed GRADE II INVASIVE DUCTAL CARCINOMA, DUCTAL CARCINOMA IN SITU, INTERMEDIATE GRADE, LYMPHOVASCULAR INVASION: NOT IDENTIFIED, CALCIFICATIONS: NOT IDENTIFIED of the RIGHT breast, 4 o'clock, 6 cmfn, (hydromark clip). This was found to be concordant by Dr. Alm Parkins. Pathology results were discussed with the patient by telephone. The patient reported doing well after the biopsy with tenderness at the site. Post biopsy instructions and care were reviewed and questions were answered. The patient was encouraged to call [facility] for any additional concerns. My direct phone number was provided. Biopsy results were sent to Dr. Donnice Bury and Dr. Amber Stalls via secure EPIC message on April 02, 2024. The patient was admitted to [facility] on  April 01, 2024 and is currently an inpatient. Pathology results reported by Hendricks Benders, RN on 04/02/2024. Electronically Signed   By: Alm Parkins M.D.   On: 04/02/2024 15:03   Result Date: 04/02/2024 CLINICAL DATA:  Patient presents for ultrasound-guided core needle biopsy of a right breast mass. 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 EXAM: ULTRASOUND GUIDED RIGHT BREAST CORE NEEDLE BIOPSY COMPARISON:  Previous exam(s). PROCEDURE: I met with the patient and we discussed the procedure of ultrasound-guided biopsy, including benefits and alternatives. We discussed the high likelihood of a successful procedure. We discussed the risks of the procedure, including infection, bleeding, tissue injury, clip migration, and inadequate sampling. Informed written consent was given. The usual time-out protocol was performed immediately prior to the procedure. Lesion quadrant: Lower inner quadrant Using sterile technique and 1% Lidocaine  as local anesthetic, under direct ultrasound visualization, a 14 gauge spring-loaded device was used to perform biopsy of the irregular mass in the right breast at 4 o'clock, 6 cm the nipple, using a medial approach. At the conclusion of the procedure a HydroMARK, spiral shaped tissue marker clip was deployed into the biopsy cavity. Follow up 2 view mammogram was not performed since this lesion could not be visualized mammographically on the diagnostic exam from 03/28/2024. IMPRESSION: Ultrasound guided biopsy of a right breast mass. No apparent complications. Electronically Signed: By: Alm Parkins M.D. On: 04/01/2024 12:05   MM DIAG BREAST TOMO UNI RIGHT Addendum Date: 04/02/2024 ADDENDUM REPORT: 04/02/2024 14:20 ADDENDUM: CLINICAL DATA should read: 73 year old female presents for further evaluation of possible RIGHT breast mass identified on recent CT. History of LEFT  breast cancer, LEFT mastectomy and RIGHT breast CSL excision. Electronically Signed   By: Reyes Phi M.D.   On: 04/02/2024 14:20   Result Date: 04/02/2024 CLINICAL DATA:  73 year old female presents for further evaluation of possible RIGHT breast mass identified on recent CT. History of bilateral breast cancers, RIGHT lumpectomy, LEFT mastectomy and excision of RIGHT breast CSL. EXAM: DIGITAL DIAGNOSTIC UNILATERAL RIGHT MAMMOGRAM WITH TOMOSYNTHESIS AND CAD; ULTRASOUND RIGHT BREAST LIMITED TECHNIQUE: Right digital diagnostic mammography and breast tomosynthesis was performed. The images were evaluated with computer-aided detection. ; Targeted ultrasound examination of  the right breast was performed COMPARISON:  03/08/2024 CT.  Prior mammograms and ultrasounds. ACR Breast Density Category b: There are scattered areas of fibroglandular density. FINDINGS: Full field and spot compression views of the RIGHT breast demonstrate surgical changes. No suspicious findings are noted in the visualized RIGHT breast. The CT abnormality is off the field of view due to far posteromedial position. On physical exam, a firm palpable mass is identified at the 4 o'clock position of the RIGHT breast 6 cm from the nipple. Targeted ultrasound is performed, showing a 1.5 x 1 x 1.6 cm irregular hypoechoic mass at the 4 o'clock position of the RIGHT breast 6 cm from the nipple. No abnormal RIGHT axillary lymph nodes are noted. IMPRESSION: Suspicious 1.6 cm LOWER INNER RIGHT breast mass, which is off the field of view and not visualized mammographically due to far posteromedial position. Tissue sampling is recommended. No abnormal appearing RIGHT axillary lymph nodes. On CT, there is a clear fat plane between the suspicious RIGHT breast mass and pectoralis musculature. RECOMMENDATION: Ultrasound-guided RIGHT breast biopsy, which will be scheduled. I have discussed the findings and recommendations with the patient. If applicable, a reminder  letter will be sent to the patient regarding the next appointment. BI-RADS CATEGORY  4: Suspicious. Electronically Signed: By: Reyes Phi M.D. On: 03/28/2024 12:59   US  LIMITED ULTRASOUND INCLUDING AXILLA RIGHT BREAST Addendum Date: 04/02/2024 ADDENDUM REPORT: 04/02/2024 14:20 ADDENDUM: CLINICAL DATA should read: 73 year old female presents for further evaluation of possible RIGHT breast mass identified on recent CT. History of LEFT breast cancer, LEFT mastectomy and RIGHT breast CSL excision. Electronically Signed   By: Reyes Phi M.D.   On: 04/02/2024 14:20   Result Date: 04/02/2024 CLINICAL DATA:  74 year old female presents for further evaluation of possible RIGHT breast mass identified on recent CT. History of bilateral breast cancers, RIGHT lumpectomy, LEFT mastectomy and excision of RIGHT breast CSL. EXAM: DIGITAL DIAGNOSTIC UNILATERAL RIGHT MAMMOGRAM WITH TOMOSYNTHESIS AND CAD; ULTRASOUND RIGHT BREAST LIMITED TECHNIQUE: Right digital diagnostic mammography and breast tomosynthesis was performed. The images were evaluated with computer-aided detection. ; Targeted ultrasound examination of the right breast was performed COMPARISON:  03/08/2024 CT.  Prior mammograms and ultrasounds. ACR Breast Density Category b: There are scattered areas of fibroglandular density. FINDINGS: Full field and spot compression views of the RIGHT breast demonstrate surgical changes. No suspicious findings are noted in the visualized RIGHT breast. The CT abnormality is off the field of view due to far posteromedial position. On physical exam, a firm palpable mass is identified at the 4 o'clock position of the RIGHT breast 6 cm from the nipple. Targeted ultrasound is performed, showing a 1.5 x 1 x 1.6 cm irregular hypoechoic mass at the 4 o'clock position of the RIGHT breast 6 cm from the nipple. No abnormal RIGHT axillary lymph nodes are noted. IMPRESSION: Suspicious 1.6 cm LOWER INNER RIGHT breast mass, which is off the field  of view and not visualized mammographically due to far posteromedial position. Tissue sampling is recommended. No abnormal appearing RIGHT axillary lymph nodes. On CT, there is a clear fat plane between the suspicious RIGHT breast mass and pectoralis musculature. RECOMMENDATION: Ultrasound-guided RIGHT breast biopsy, which will be scheduled. I have discussed the findings and recommendations with the patient. If applicable, a reminder letter will be sent to the patient regarding the next appointment. BI-RADS CATEGORY  4: Suspicious. Electronically Signed: By: Reyes Phi M.D. On: 03/28/2024 12:59   DG ABD ACUTE 2+V W 1V CHEST Result  Date: 04/01/2024 CLINICAL DATA:  Nausea and vomiting. Unable to tolerate p.o. intake. EXAM: DG ABDOMEN ACUTE WITH 1 VIEW CHEST COMPARISON:  Chest CT 03/08/2024 abdominal radiograph 03/10/2024 FINDINGS: Right chest port in place. The heart is normal in size. No focal airspace disease, pleural effusion or pneumothorax. No pulmonary edema. Left mastectomy with surgical clips in the axilla. No free intra-abdominal air. No gaseous small bowel distension. Air within nondilated transverse colon, with a few air-fluid levels. Small volume of formed stool in the colon. Right upper quadrant surgical clips. L1 kyphoplasty. Left hip arthroplasty. Stable sclerotic focus within the right inter trochanteric femur. IMPRESSION: 1. No acute findings in the chest. 2. Nonobstructive bowel gas pattern. Air within nondilated transverse colon with a few air-fluid levels, nonspecific. This can be seen with diarrheal process. 3. Stable sclerotic focus in the right proximal femur. Electronically Signed   By: Andrea Gasman M.D.   On: 04/01/2024 16:42   DG Abd 1 View Result Date: 03/10/2024 CLINICAL DATA:  Intractable nausea and vomiting. EXAM: ABDOMEN - 1 VIEW COMPARISON:  CT 03/08/2024 FINDINGS: The bowel gas pattern is nonspecific. There is no dilated loops of small or large bowel identified. Surgical  clips noted in the right upper quadrant of the abdomen. No acute osseous findings. Signs vertebroplasty noted at the L1 level. Previous left hip arthroplasty. Sclerotic lesion within the intertrochanteric portions of the proximal right femur noted. This is compatible with a benign bone island. IMPRESSION: Nonspecific bowel gas pattern.  No evidence for bowel obstruction. Electronically Signed   By: Waddell Calk M.D.   On: 03/10/2024 06:11   CT CHEST ABDOMEN PELVIS WO CONTRAST Result Date: 03/08/2024 CLINICAL DATA:  Sepsis.  Abdominal pain.  Nausea and vomiting EXAM: CT CHEST, ABDOMEN AND PELVIS WITHOUT CONTRAST TECHNIQUE: Multidetector CT imaging of the chest, abdomen and pelvis was performed following the standard protocol without IV contrast. RADIATION DOSE REDUCTION: This exam was performed according to the departmental dose-optimization program which includes automated exposure control, adjustment of the mA and/or kV according to patient size and/or use of iterative reconstruction technique. COMPARISON:  Contrast enhanced 1CT 10/02/2017 FINDINGS: CT CHEST FINDINGS Cardiovascular: Accessed right chest port in place. The heart is upper normal in size. No pericardial effusion. Aortic atherosclerosis and tortuosity. Mediastinum/Nodes: No enlarged mediastinal lymph nodes. Hilar assessment is limited in the absence of IV contrast. No axillary adenopathy. Decompressed esophagus. Lungs/Pleura: Mild emphysema and diffuse bronchial thickening. Occasional mucoid impaction in the subsegmental lower lobes. No focal airspace disease. No pleural fluid. The trachea and central airways are clear. Musculoskeletal: Slight coarsened trabecula within T6 spinous process left mastectomy. Question of 14 mm inferior right breast nodule, series 2, image 44. CT ABDOMEN PELVIS FINDINGS Hepatobiliary: Diffuse hepatic steatosis. No evidence of focal liver lesion allowing for lack of contrast. There is more focal fatty infiltration  adjacent to the gallbladder fossa. Clips in the gallbladder fossa postcholecystectomy. No biliary dilatation. Pancreas: No ductal dilatation or inflammation. Spleen: Normal in size without focal abnormality. Adrenals/Urinary Tract: No adrenal nodule. No hydronephrosis. Mild symmetric perinephric edema. No renal calculi. Multiple bilateral low-density renal lesions are too small to characterize but likely cysts. No specific imaging follow-up is needed. Urinary bladder is near completely empty. Stomach/Bowel: The stomach is nondistended. Single prominent loop of fluid-filled small bowel in the left abdomen, series 2, image 81. No associated wall thickening. No obstruction. The appendix is not confidently visualized, there is no evidence of appendicitis. Small to moderate volume of formed stool in the  colon. There is slight engorgement of the mesenteric vasculature and Vasa recta. Vascular/Lymphatic: Aortic atherosclerosis. No aneurysm. No enlarged lymph nodes in the abdomen or pelvis. Reproductive: Uterus and bilateral adnexa are unremarkable. Other: No ascites. No free air. No focal fluid collection. Small to moderate-sized fat containing umbilical hernia. No omental thickening. Musculoskeletal: Vertebroplasty within L1 compression fracture. Left hip arthroplasty. There are no acute or suspicious osseous abnormalities. IMPRESSION: 1. Single prominent loop of fluid-filled small bowel in the left abdomen, nonspecific. No associated wall thickening or obstruction. There is slight engorgement of the mesenteric vasculature and vasa recta, suspect nonspecific enteritis. 2. Slight trabecular thickening within the spinous process of T6, nonspecific. Consider bone scan or MRI if there is clinical concern for metastasis. 3. Hepatic steatosis. 4. Question of 14 mm inferior right breast nodule. Follow-up per oncologic protocol in this patient with breast cancer. 5. Small to moderate-sized fat containing umbilical hernia. Aortic  Atherosclerosis (ICD10-I70.0) and Emphysema (ICD10-J43.9). Electronically Signed   By: Andrea Gasman M.D.   On: 03/08/2024 18:04     Subjective: Patient was seen and examined at bed side. She reports feeling better and has tolerated soft diet. She wants to be discharged home.  Discharge Exam: Vitals:   04/07/24 0457 04/07/24 1332  BP: 135/75 132/74  Pulse: 69 82  Resp: 17 18  Temp: (!) 97.5 F (36.4 C) 98.6 F (37 C)  SpO2: 91% 93%   Vitals:   04/06/24 1339 04/06/24 2013 04/07/24 0457 04/07/24 1332  BP: 129/67 (!) 152/75 135/75 132/74  Pulse: 83 81 69 82  Resp: 18 16 17 18   Temp: 99 F (37.2 C) 98.3 F (36.8 C) (!) 97.5 F (36.4 C) 98.6 F (37 C)  TempSrc: Oral Oral Oral Oral  SpO2: 93% 94% 91% 93%  Weight:   76.6 kg   Height:        General: Pt is alert, awake, not in acute distress Cardiovascular: RRR, S1/S2 +, no rubs, no gallops Respiratory: CTA bilaterally, no wheezing, no rhonchi Abdominal: Soft, NT, ND, bowel sounds + Extremities: no edema, no cyanosis    The results of significant diagnostics from this hospitalization (including imaging, microbiology, ancillary and laboratory) are listed below for reference.     Microbiology: No results found for this or any previous visit (from the past 240 hours).   Labs: BNP (last 3 results) Recent Labs    04/02/24 0524  BNP 14.1   Basic Metabolic Panel: Recent Labs  Lab 04/01/24 1710 04/02/24 0521 04/03/24 0500 04/04/24 0500 04/05/24 0430 04/06/24 0515 04/07/24 0500  NA 137 138 135 136 135 139 139  K 2.7* 3.3* 2.9* 3.5 3.5 3.3* 3.3*  CL 101 104 104 104 99 101 101  CO2 19* 21* 22 23 24 28 27   GLUCOSE 106* 87 101* 94 141* 95 93  BUN 14 14 10 8 8 10 11   CREATININE 0.93 0.99 0.92 0.95 0.97 0.95 0.93  CALCIUM  10.4* 9.5 9.0 9.6 10.0 9.6 9.5  MG 2.0 2.1  --   --   --   --   --   PHOS 1.7* 3.4  --   --   --   --   --    Liver Function Tests: Recent Labs  Lab 04/03/24 0500 04/04/24 0500  04/05/24 0430 04/06/24 0515 04/07/24 0500  AST 31 33 32 53* 53*  ALT 27 28 30  42 52*  ALKPHOS 78 77 82 76 76  BILITOT 2.3* 1.9* 1.3* 1.4* 1.6*  PROT 6.3*  6.8 6.7 6.0* 6.4*  ALBUMIN  3.6 3.7 3.7 3.5 3.6   Recent Labs  Lab 04/01/24 1710 04/02/24 0521  LIPASE 37 39   No results for input(s): AMMONIA  in the last 168 hours. CBC: Recent Labs  Lab 04/01/24 1710 04/02/24 0521 04/03/24 0500 04/04/24 0500 04/05/24 0430 04/06/24 0515 04/07/24 0500  WBC 6.8 5.3 5.3 5.6 5.5 7.4 4.9  NEUTROABS 4.8 3.5  --   --   --   --   --   HGB 15.6* 13.9 13.8 14.4 14.2 13.5 14.0  HCT 45.7 41.4 41.6 43.7 42.0 41.4 43.1  MCV 87.5 90.6 90.2 91.4 90.1 91.2 92.7  PLT 157 130* 131* 157 160 182 181   Cardiac Enzymes: No results for input(s): CKTOTAL, CKMB, CKMBINDEX, TROPONINI in the last 168 hours. BNP: Invalid input(s): POCBNP CBG: No results for input(s): GLUCAP in the last 168 hours. D-Dimer No results for input(s): DDIMER in the last 72 hours. Hgb A1c No results for input(s): HGBA1C in the last 72 hours. Lipid Profile No results for input(s): CHOL, HDL, LDLCALC, TRIG, CHOLHDL, LDLDIRECT in the last 72 hours. Thyroid  function studies No results for input(s): TSH, T4TOTAL, T3FREE, THYROIDAB in the last 72 hours.  Invalid input(s): FREET3 Anemia work up No results for input(s): VITAMINB12, FOLATE, FERRITIN, TIBC, IRON, RETICCTPCT in the last 72 hours. Urinalysis    Component Value Date/Time   COLORURINE YELLOW 04/01/2024 2203   APPEARANCEUR CLOUDY (A) 04/01/2024 2203   LABSPEC 1.018 04/01/2024 2203   PHURINE 7.0 04/01/2024 2203   GLUCOSEU NEGATIVE 04/01/2024 2203   HGBUR NEGATIVE 04/01/2024 2203   BILIRUBINUR NEGATIVE 04/01/2024 2203   KETONESUR 80 (A) 04/01/2024 2203   PROTEINUR NEGATIVE 04/01/2024 2203   UROBILINOGEN 0.2 08/23/2011 0312   NITRITE NEGATIVE 04/01/2024 2203   LEUKOCYTESUR SMALL (A) 04/01/2024 2203   Sepsis  Labs Recent Labs  Lab 04/04/24 0500 04/05/24 0430 04/06/24 0515 04/07/24 0500  WBC 5.6 5.5 7.4 4.9   Microbiology No results found for this or any previous visit (from the past 240 hours).   Time coordinating discharge: Over 30 minutes  SIGNED:   Darcel Dawley, MD  [facility] 04/07/2024, 5:09 PM Pager   If 7PM-7AM, please contact night-coverage

## 2024-04-08 ENCOUNTER — Ambulatory Visit

## 2024-04-08 ENCOUNTER — Encounter: Payer: Self-pay | Admitting: *Deleted

## 2024-04-08 ENCOUNTER — Other Ambulatory Visit: Payer: Self-pay

## 2024-04-08 ENCOUNTER — Ambulatory Visit
Admission: RE | Admit: 2024-04-08 | Discharge: 2024-04-08 | Disposition: A | Discharge status: Home / Self Care | Source: Ambulatory Visit | Attending: Radiation Oncology | Admitting: Radiation Oncology

## 2024-04-08 DIAGNOSIS — Z79899 Other long term (current) drug therapy: Secondary | ICD-10-CM | POA: Diagnosis not present

## 2024-04-08 DIAGNOSIS — N6091 Unspecified benign mammary dysplasia of right breast: Secondary | ICD-10-CM | POA: Diagnosis present

## 2024-04-08 DIAGNOSIS — Z171 Estrogen receptor negative status [ER-]: Secondary | ICD-10-CM | POA: Diagnosis present

## 2024-04-08 DIAGNOSIS — C50912 Malignant neoplasm of unspecified site of left female breast: Secondary | ICD-10-CM | POA: Diagnosis present

## 2024-04-08 LAB — RAD ONC ARIA SESSION SUMMARY
Course Elapsed Days: 49
Plan Fractions Treated to Date: 1
Plan Prescribed Dose Per Fraction: 2 Gy
Plan Total Fractions Prescribed: 5
Plan Total Prescribed Dose: 10 Gy
Reference Point Dosage Given to Date: 2 Gy
Reference Point Session Dosage Given: 2 Gy
Session Number: 29

## 2024-04-09 ENCOUNTER — Ambulatory Visit

## 2024-04-09 ENCOUNTER — Ambulatory Visit
Admission: RE | Admit: 2024-04-09 | Discharge: 2024-04-09 | Disposition: A | Discharge status: Home / Self Care | Source: Ambulatory Visit | Attending: Radiation Oncology | Admitting: Radiation Oncology

## 2024-04-09 ENCOUNTER — Inpatient Hospital Stay: Admitting: Nutrition

## 2024-04-09 ENCOUNTER — Encounter: Admitting: Nutrition

## 2024-04-09 ENCOUNTER — Other Ambulatory Visit: Payer: Self-pay

## 2024-04-09 DIAGNOSIS — C773 Secondary and unspecified malignant neoplasm of axilla and upper limb lymph nodes: Secondary | ICD-10-CM | POA: Insufficient documentation

## 2024-04-09 DIAGNOSIS — Z923 Personal history of irradiation: Secondary | ICD-10-CM | POA: Insufficient documentation

## 2024-04-09 DIAGNOSIS — Z1721 Progesterone receptor positive status: Secondary | ICD-10-CM | POA: Insufficient documentation

## 2024-04-09 DIAGNOSIS — Z170 Estrogen receptor positive status [ER+]: Secondary | ICD-10-CM | POA: Insufficient documentation

## 2024-04-09 DIAGNOSIS — Z9221 Personal history of antineoplastic chemotherapy: Secondary | ICD-10-CM | POA: Insufficient documentation

## 2024-04-09 DIAGNOSIS — Z1732 Human epidermal growth factor receptor 2 negative status: Secondary | ICD-10-CM | POA: Insufficient documentation

## 2024-04-09 DIAGNOSIS — Z9012 Acquired absence of left breast and nipple: Secondary | ICD-10-CM | POA: Insufficient documentation

## 2024-04-09 DIAGNOSIS — G629 Polyneuropathy, unspecified: Secondary | ICD-10-CM | POA: Insufficient documentation

## 2024-04-09 DIAGNOSIS — E876 Hypokalemia: Secondary | ICD-10-CM | POA: Insufficient documentation

## 2024-04-09 DIAGNOSIS — E538 Deficiency of other specified B group vitamins: Secondary | ICD-10-CM | POA: Insufficient documentation

## 2024-04-09 DIAGNOSIS — C50912 Malignant neoplasm of unspecified site of left female breast: Secondary | ICD-10-CM | POA: Diagnosis not present

## 2024-04-09 DIAGNOSIS — C50212 Malignant neoplasm of upper-inner quadrant of left female breast: Secondary | ICD-10-CM | POA: Insufficient documentation

## 2024-04-09 LAB — RAD ONC ARIA SESSION SUMMARY
Course Elapsed Days: 50
Plan Fractions Treated to Date: 2
Plan Prescribed Dose Per Fraction: 2 Gy
Plan Total Fractions Prescribed: 5
Plan Total Prescribed Dose: 10 Gy
Reference Point Dosage Given to Date: 4 Gy
Reference Point Session Dosage Given: 2 Gy
Session Number: 30

## 2024-04-09 NOTE — Progress Notes (Signed)
 73 year old female recently diagnosed with right breast cancer.  Left breast cancer diagnosed December, 2024.  At that time patient had TCHP every 21 days.  Current treatment plan includes Kadcyla  every 21 days.  Final radiation treatment scheduled for July 11.  Plan is for mastectomy.  Noted recent admission for intractable nausea and vomiting.  Past medical history includes hypothyroidism, CHF, depression, and sleep apnea.  Medications include Synthroid , Reglan , Zyprexa , Zofran , Protonix , Compazine , Carafate .  Labs include potassium 3.3.  Height: 5 feet 5 inches.  Weight:  168 pounds 14 ounces July 6.   BMI: 29.79 183 pounds 6.8 ounces June 8  192 pounds 3.2 ounces May 6  Usual body weight: 198 pounds per patient  12% weight loss in less than 3 months/clinically significant.  Patient reports nausea and vomiting is resolved and she no longer takes antiemetics.  She has increased fatigue.  Her appetite is poor.  She experiences taste alterations limiting oral intake.  Reports eating half a piece of toast with jelly and some watermelon for breakfast and half of a banana shake for lunch.  Reports bread tastes bitter.  She developed food aversion from ongoing nausea and vomiting.  Nutrition diagnosis:  Unintended weight loss related to cancer and associated treatments as evidenced by 12% loss in less than 3 months.  Intervention: Educated to eat small amounts of food every 2-3 hours. Encouraged oral nutrition supplements if tolerated.  Provided samples. Educated on strategies to improve taste alterations. Encouraged nausea medications if nausea resumes. Provided samples and nutrition fact sheets.  Questions answered.  Contact information given.  Monitoring, evaluation, goals: Patient will tolerate increased calories and protein to minimize further weight loss.  Next visit: To be scheduled as needed.  **Disclaimer: This note was dictated with voice recognition software. Similar  sounding words can inadvertently be transcribed and this note may contain transcription errors which may not have been corrected upon publication of note.**

## 2024-04-10 ENCOUNTER — Other Ambulatory Visit: Payer: Self-pay | Admitting: *Deleted

## 2024-04-10 ENCOUNTER — Inpatient Hospital Stay

## 2024-04-10 ENCOUNTER — Ambulatory Visit
Admission: RE | Admit: 2024-04-10 | Discharge: 2024-04-10 | Disposition: A | Discharge status: Home / Self Care | Source: Ambulatory Visit | Attending: Radiation Oncology | Admitting: Radiation Oncology

## 2024-04-10 ENCOUNTER — Ambulatory Visit

## 2024-04-10 ENCOUNTER — Encounter: Payer: Self-pay | Admitting: Adult Health

## 2024-04-10 ENCOUNTER — Other Ambulatory Visit: Payer: Self-pay

## 2024-04-10 ENCOUNTER — Inpatient Hospital Stay (HOSPITAL_BASED_OUTPATIENT_CLINIC_OR_DEPARTMENT_OTHER): Admitting: Adult Health

## 2024-04-10 VITALS — BP 120/70 | HR 92 | Temp 97.4°F | Resp 18 | Ht 65.0 in | Wt 169.1 lb

## 2024-04-10 DIAGNOSIS — Z171 Estrogen receptor negative status [ER-]: Secondary | ICD-10-CM

## 2024-04-10 DIAGNOSIS — C50212 Malignant neoplasm of upper-inner quadrant of left female breast: Secondary | ICD-10-CM | POA: Diagnosis not present

## 2024-04-10 DIAGNOSIS — E860 Dehydration: Secondary | ICD-10-CM

## 2024-04-10 DIAGNOSIS — E876 Hypokalemia: Secondary | ICD-10-CM

## 2024-04-10 DIAGNOSIS — Z95828 Presence of other vascular implants and grafts: Secondary | ICD-10-CM

## 2024-04-10 DIAGNOSIS — C50912 Malignant neoplasm of unspecified site of left female breast: Secondary | ICD-10-CM | POA: Diagnosis not present

## 2024-04-10 LAB — CBC WITH DIFFERENTIAL (CANCER CENTER ONLY)
Abs Immature Granulocytes: 0.03 K/uL (ref 0.00–0.07)
Basophils Absolute: 0.1 K/uL (ref 0.0–0.1)
Basophils Relative: 2 %
Eosinophils Absolute: 0.2 K/uL (ref 0.0–0.5)
Eosinophils Relative: 4 %
HCT: 43.9 % (ref 36.0–46.0)
Hemoglobin: 14.8 g/dL (ref 12.0–15.0)
Immature Granulocytes: 1 %
Lymphocytes Relative: 17 %
Lymphs Abs: 0.8 K/uL (ref 0.7–4.0)
MCH: 29.8 pg (ref 26.0–34.0)
MCHC: 33.7 g/dL (ref 30.0–36.0)
MCV: 88.5 fL (ref 80.0–100.0)
Monocytes Absolute: 0.7 K/uL (ref 0.1–1.0)
Monocytes Relative: 14 %
Neutro Abs: 2.9 K/uL (ref 1.7–7.7)
Neutrophils Relative %: 62 %
Platelet Count: 228 K/uL (ref 150–400)
RBC: 4.96 MIL/uL (ref 3.87–5.11)
RDW: 15.1 % (ref 11.5–15.5)
WBC Count: 4.6 K/uL (ref 4.0–10.5)
nRBC: 0 % (ref 0.0–0.2)

## 2024-04-10 LAB — RAD ONC ARIA SESSION SUMMARY
Course Elapsed Days: 51
Plan Fractions Treated to Date: 3
Plan Prescribed Dose Per Fraction: 2 Gy
Plan Total Fractions Prescribed: 5
Plan Total Prescribed Dose: 10 Gy
Reference Point Dosage Given to Date: 6 Gy
Reference Point Session Dosage Given: 2 Gy
Session Number: 31

## 2024-04-10 LAB — CMP (CANCER CENTER ONLY)
ALT: 37 U/L (ref 0–44)
AST: 34 U/L (ref 15–41)
Albumin: 4 g/dL (ref 3.5–5.0)
Alkaline Phosphatase: 84 U/L (ref 38–126)
Anion gap: 11 (ref 5–15)
BUN: 8 mg/dL (ref 8–23)
CO2: 27 mmol/L (ref 22–32)
Calcium: 10 mg/dL (ref 8.9–10.3)
Chloride: 101 mmol/L (ref 98–111)
Creatinine: 0.93 mg/dL (ref 0.44–1.00)
GFR, Estimated: >60 mL/min (ref >60)
Glucose, Bld: 127 mg/dL — ABNORMAL HIGH (ref 70–99)
Potassium: 3.2 mmol/L — ABNORMAL LOW (ref 3.5–5.1)
Sodium: 139 mmol/L (ref 135–145)
Total Bilirubin: 1.7 mg/dL — ABNORMAL HIGH (ref 0.0–1.2)
Total Protein: 6.4 g/dL — ABNORMAL LOW (ref 6.5–8.1)

## 2024-04-10 LAB — MAGNESIUM: Magnesium: 1.9 mg/dL (ref 1.7–2.4)

## 2024-04-10 LAB — VITAMIN B12: Vitamin B-12: 173 pg/mL — ABNORMAL LOW (ref 180–914)

## 2024-04-10 MED ORDER — SODIUM CHLORIDE 0.9 % IV SOLN: 10.0000 meq | Freq: Once | INTRAVENOUS | Status: DC

## 2024-04-10 MED ORDER — HEPARIN SOD (PORK) LOCK FLUSH 100 UNIT/ML IV SOLN
500.0000 [IU] | Freq: Once | INTRAVENOUS | Status: AC
  Administered 2024-04-10: 500 [IU]

## 2024-04-10 MED ORDER — POTASSIUM CHLORIDE IN NACL 20-0.9 MEQ/L-% IV SOLN
INTRAVENOUS | Status: DC
  Filled 2024-04-10: qty 1000

## 2024-04-10 MED ORDER — METOCLOPRAMIDE HCL 10 MG PO TABS: 10.0000 mg | ORAL_TABLET | Freq: Four times a day (QID) | ORAL | 0 refills | Status: DC | PRN

## 2024-04-10 MED ORDER — SODIUM CHLORIDE 0.9% FLUSH
10.0000 mL | Freq: Once | INTRAVENOUS | Status: AC
  Administered 2024-04-10: 10 mL

## 2024-04-10 NOTE — Patient Instructions (Signed)

## 2024-04-10 NOTE — Progress Notes (Signed)
 [facility] Cancer Follow up:    Abran Jon CROME, MD [address] [place] [address] 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)  09/22/2023 Mammogram   Patient self palpated a breast mass in her left breast around November 2019 went for diagnostic mammogram, this showed there is a developing density on mammogram, 2 of the lower left axillary lymph nodes exhibit increased cortical thickness, ultrasound confirmed a 2.7 cm irregular hypoechoic mass in left breast at 12:00 5 cm from the nipple along with abnormal left axillary lymph   09/29/2023 Pathology Results   Left breast central superior needle core biopsy 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)   10/20/2023 - 10/23/2023 Chemotherapy   Patient is on Treatment Plan : BREAST  Docetaxel  + Carboplatin  + Trastuzumab  + Pertuzumab   (TCHP) q21d      11/24/2023 Cancer Staging   Staging form: Breast, AJCC 8th Edition - Pathologic stage from 11/24/2023: ypT2, pN1, cM0, G3, ER-, PR-, HER2+ - Signed by Loretha Ash, MD on 11/24/2023 Stage prefix: Post-therapy Histologic grading system: 3 grade system   01/16/2024 - 03/19/2024 Chemotherapy   Patient is on Treatment Plan : BREAST ADO-Trastuzumab Emtansine  (Kadcyla ) q21d       CURRENT THERAPY:  radiation  INTERVAL HISTORY:  Discussed the use of AI scribe software for clinical note transcription with the patient, who gave verbal consent to proceed.  History of Present Illness Rebecca Steele is a 73 year old female with breast cancer who presents with a burning sensation in her feet and low potassium levels.  She experiences a burning sensation in her feet, described as feeling like 'hot coals' when she moves them, which began approximately four days ago. There is no back pain or other associated symptoms.  She has a recent diagnosis of breast cancer on the opposite side and is scheduled for surgery next Tuesday. Her recent biopsy results are estrogen and progesterone positive, but HER2 negative.  She has difficulty maintaining potassium levels, with a recent measurement of 3.2 mEq/L. Oral potassium supplements cause vomiting, and she is currently not taking any due to intolerance. She struggles with maintaining food intake, often eating only small bites, but is able to keep fluids down.    Patient Active Problem List   Diagnosis Date Noted   Intractable nausea and vomiting 04/01/2024   Dehydration 04/01/2024   Epigastric pain 04/01/2024   High anion gap metabolic acidosis 04/01/2024   Hypercalcemia 04/01/2024   Hyperbilirubinemia 04/01/2024   Hypophosphatemia 04/01/2024   Chronic diastolic CHF (congestive heart failure) (HCC) 04/01/2024   Acquired hypothyroidism 04/01/2024   Allergic rhinitis 04/01/2024   Depression 04/01/2024   Nausea & vomiting 03/10/2024   Chemotherapy induced nausea and vomiting 03/10/2024   S/P left mastectomy 12/12/2023   Breast cancer, left breast (HCC) 11/20/2023   Port-A-Cath  in place 10/18/2023   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) 10/09/2023   Closed compression fracture of body of L1 vertebra (HCC) 10/05/2021   Compression fracture of L1 lumbar vertebra (HCC) 10/04/2021   Fall at home, initial encounter  10/04/2021   Hypokalemia 10/04/2021   Scalp laceration 10/04/2021   Leukocytosis 10/04/2021   Primary osteoarthritis of left hip 06/04/2021   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  MEDICAL HISTORY: Past Medical History:  Diagnosis Date   Breast mass, right    Cancer (HCC) 10/2023   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 states she was given gabapentin  and fentanyl  with breast surgery and was diff to wake up   Depression    History of kidney stones    Hypothyroidism    Mitral valve prolapse    Nausea & vomiting 03/10/2024   Sleep apnea    HAS MILD OSA, NO CPAP NEEDED    SURGICAL HISTORY: Past Surgical History:  Procedure Laterality Date   APPENDECTOMY     BREAST BIOPSY Right 09/16/2022   MM RT BREAST BX W LOC DEV 1ST LESION IMAGE BX SPEC STEREO GUIDE 09/16/2022 [facility]   BREAST BIOPSY  12/06/2022   MM RT RADIOACTIVE SEED LOC MAMMO GUIDE 12/06/2022 [facility]   BREAST BIOPSY Left 11/16/2023   US  LT RADIOACTIVE SEED LOC 11/16/2023 [facility]   BREAST BIOPSY  11/16/2023   MM LT RADIOACTIVE SEED EA ADD LESION LOC MAMMO GUIDE 11/16/2023 [facility]   BREAST BIOPSY Right 04/01/2024   US  RT BREAST BX W LOC DEV 1ST LESION IMG BX SPEC US  GUIDE 04/01/2024 [facility]   BREAST EXCISIONAL BIOPSY Right 2018   BREAST LUMPECTOMY WITH RADIOACTIVE SEED AND SENTINEL LYMPH NODE BIOPSY Left 11/20/2023   Procedure: LEFT BREAST SEED GUIDED LUMPECTOMY, LEFT AXILLARY SENTINEL NODE BIOPSY;  Surgeon: Ebbie Cough, MD;  Location: [facility];  Service:  General;  Laterality: Left;  PEC BLOCK   CHOLECYSTECTOMY     CYSTOSCOPY W/ RETROGRADES     KYPHOPLASTY N/A 10/05/2021   Procedure: LUMBAR ONE KYPHOPLASTY;  Surgeon: Cheryle Debby LABOR, MD;  Location: [facility];  Service: Neurosurgery;  Laterality: N/A;   NECK SURGERY     PORTACATH PLACEMENT Right 10/12/2023   Procedure: INSERTION PORT-A-CATH WITH GUIDED ULTRASOUND;  Surgeon: Ebbie Cough, MD;  Location: [facility];  Service: General;  Laterality: Right;   RADIOACTIVE SEED GUIDED AXILLARY SENTINEL LYMPH NODE Left 11/20/2023   Procedure: LEFT AXILLARY NODE SEED GUIDED EXCISION;  Surgeon: Ebbie Cough, MD;  Location: [facility];  Service: General;  Laterality: Left;   RADIOACTIVE SEED GUIDED EXCISIONAL BREAST BIOPSY Right 12/12/2017   Procedure: RIGHT RADIOACTIVE SEED GUIDED EXCISIONAL BREAST BIOPSY ERAS PATHWAY;  Surgeon: Ebbie Cough, MD;  Location: [facility];  Service: General;  Laterality: Right;  LMA   RADIOACTIVE SEED GUIDED EXCISIONAL BREAST BIOPSY Right 12/07/2022   Procedure: RADIOACTIVE SEED GUIDED EXCISIONAL RIGHT BREAST BIOPSY;  Surgeon: Ebbie Cough, MD;  Location: [facility];  Service: General;  Laterality: Right;   SHOULDER SURGERY     SIMPLE  MASTECTOMY WITH AXILLARY SENTINEL NODE BIOPSY Left 12/12/2023   Procedure: LEFT  MASTECTOMY;  Surgeon: Ebbie Cough, MD;  Location: [facility];  Service: General;  Laterality: Left;   TONSILLECTOMY     TOTAL HIP ARTHROPLASTY Left 06/04/2021   Procedure: TOTAL HIP ARTHROPLASTY ANTERIOR APPROACH;  Surgeon: Yvone Rush, MD;  Location: [facility];  Service: Orthopedics;  Laterality: Left;   TRIGGER FINGER RELEASE Right 05/18/2015   Procedure: RIGHT  LONG FINGER TRIGGER RELEASE ;  Surgeon: Alm Hummer, MD;  Location: [facility];  Service: Orthopedics;  Laterality: Right;    SOCIAL HISTORY: Social History   Socioeconomic History    Marital status: Married    Spouse name: Not on file   Number of children: Not on file   Years of education: Not on file   Highest education level: Not on file  Occupational History   Not on file  Tobacco Use   Smoking status: Never   Smokeless tobacco: Never  Vaping Use   Vaping status: Never Used  Substance and Sexual Activity   Alcohol  use: No   Drug use: No   Sexual activity: Not Currently    Birth control/protection: Post-menopausal  Other Topics Concern   Not on file  Social History Narrative   Not on file   Social Drivers of Health   Financial Resource Strain: Low Risk  (01/01/2024)   [facility]   Overall Financial Resource Strain (CARDIA)    Difficulty of Paying Living Expenses: Not hard at all  Food Insecurity: No Food Insecurity (04/02/2024)   Hunger Vital Sign    Worried About Running Out of Food in the Last Year: Never true    Ran Out of Food in the Last Year: Never true  Transportation Needs: No Transportation Needs (04/02/2024)   PRAPARE - Administrator, Civil Service (Medical): No    Lack of Transportation (Non-Medical): No  Physical Activity: Inactive (04/24/2023)   Received from [facility]   Exercise Vital Sign    On average, how many days per week do you engage in moderate to strenuous exercise (like a brisk walk)?: 0 days    On average, how many minutes do you engage in exercise at this level?: 10 min  Stress: No Stress Concern Present (04/24/2023)   Received from [facility] - Occupational Stress Questionnaire    Feeling of Stress : Not at all  Social Connections: Socially Integrated (04/02/2024)   Social Connection and Isolation Panel    Frequency of Communication with Friends and Family: More than three times a week    Frequency of Social Gatherings with Friends and Family: More than three times a week    Attends Religious Services: More than 4 times per year    Active Member of  [employer] or Organizations: Yes    Attends Banker Meetings: More than 4 times per year    Marital Status: Married  Catering Manager Violence: Not At Risk (04/02/2024)   Humiliation, Afraid, Rape, and Kick questionnaire    Fear of Current or Ex-Partner: No    Emotionally Abused: No    Physically Abused: No    Sexually Abused: No    FAMILY HISTORY: Family History  Problem Relation Age of Onset   Breast cancer Maternal Aunt        80s    Review of Systems  Constitutional:  Negative for appetite change, chills, fatigue, fever and  unexpected weight change.  HENT:   Negative for hearing loss, lump/mass and trouble swallowing.   Eyes:  Negative for eye problems and icterus.  Respiratory:  Negative for chest tightness, cough and shortness of breath.   Cardiovascular:  Negative for chest pain, leg swelling and palpitations.  Gastrointestinal:  Negative for abdominal distention, abdominal pain, constipation, diarrhea, nausea and vomiting.  Endocrine: Negative for hot flashes.  Genitourinary:  Negative for difficulty urinating.   Musculoskeletal:  Negative for arthralgias.  Skin:  Negative for itching and rash.  Neurological:  Negative for dizziness, extremity weakness, headaches and numbness.  Hematological:  Negative for adenopathy. Does not bruise/bleed easily.  Psychiatric/Behavioral:  Negative for depression. The patient is not nervous/anxious.       PHYSICAL EXAMINATION   Onc Performance Status - 04/10/24 0848       ECOG Perf Status   ECOG Perf Status Capable of only limited selfcare, confined to bed or chair more than 50% of waking hours      KPS SCALE   KPS % SCORE Cares for self, unable to carry on normal activity or to do active work          Vitals:   04/10/24 0845  BP: 120/70  Pulse: 92  Resp: 18  Temp: (!) 97.4 F (36.3 C)  SpO2: 98%    Physical Exam Constitutional:      General: She is not in acute distress.    Appearance: Normal appearance.  She is not toxic-appearing.  HENT:     Head: Normocephalic and atraumatic.     Mouth/Throat:     Mouth: Mucous membranes are moist.     Pharynx: Oropharynx is clear. No oropharyngeal exudate or posterior oropharyngeal erythema.  Eyes:     General: No scleral icterus. Cardiovascular:     Rate and Rhythm: Normal rate and regular rhythm.     Pulses: Normal pulses.     Heart sounds: Normal heart sounds.  Pulmonary:     Effort: Pulmonary effort is normal.     Breath sounds: Normal breath sounds.  Abdominal:     General: Abdomen is flat. Bowel sounds are normal. There is no distension.     Palpations: Abdomen is soft.     Tenderness: There is no abdominal tenderness.  Musculoskeletal:        General: No swelling.     Cervical back: Neck supple.  Lymphadenopathy:     Cervical: No cervical adenopathy.  Skin:    General: Skin is warm and dry.     Findings: No rash.  Neurological:     General: No focal deficit present.     Mental Status: She is alert.  Psychiatric:        Mood and Affect: Mood normal.        Behavior: Behavior normal.     LABORATORY DATA:  CBC    Component Value Date/Time   WBC 4.6 04/10/2024 0747   WBC 4.9 04/07/2024 0500   RBC 4.96 04/10/2024 0747   HGB 14.8 04/10/2024 0747   HCT 43.9 04/10/2024 0747   PLT 228 04/10/2024 0747   MCV 88.5 04/10/2024 0747   MCH 29.8 04/10/2024 0747   MCHC 33.7 04/10/2024 0747   RDW 15.1 04/10/2024 0747   LYMPHSABS 0.8 04/10/2024 0747   MONOABS 0.7 04/10/2024 0747   EOSABS 0.2 04/10/2024 0747   BASOSABS 0.1 04/10/2024 0747    CMP     Component Value Date/Time   NA 139 04/10/2024 0747  K 3.2 (L) 04/10/2024 0747   CL 101 04/10/2024 0747   CO2 27 04/10/2024 0747   GLUCOSE 127 (H) 04/10/2024 0747   BUN 8 04/10/2024 0747   CREATININE 0.93 04/10/2024 0747   CALCIUM  10.0 04/10/2024 0747   PROT 6.4 (L) 04/10/2024 0747   ALBUMIN  4.0 04/10/2024 0747   AST 34 04/10/2024 0747   ALT 37 04/10/2024 0747   ALKPHOS 84  04/10/2024 0747   BILITOT 1.7 (H) 04/10/2024 0747   GFRNONAA >60 04/10/2024 0747   GFRAA >60 12/12/2017 1650     ASSESSMENT and THERAPY PLAN:   Assessment and Plan Assessment & Plan Breast cancer, bilateral Bilateral breast cancer, right side recently diagnosed. ER/PR positive, HER2 negative. Difficulty swallowing pills raises concerns about oral medication tolerance. - Perform PET scan on Friday to guide treatment decisions. - Proceed with surgery on Tuesday. - Consider estrogen therapy post-surgery based on PET scan and surgical findings.  Hypokalemia Potassium level at 3.2 mEq/L. Intolerance to oral or liquid potassium. - Administer potassium intravenously today and Friday with IV fluids  Neuropathy, unspecified Burning sensation in feet, possibly due to low B12 or nutritional deficiencies. Differential includes neuropathy or other nutritional issues. H/o compression fracture, but no focal weakness or numbness in legs, back pain, saddle anesthesia, or bowel/bladder changes. - Check B12 levels. - Proceed with PET scan on Friday  All questions were answered. The patient knows to call the clinic with any problems, questions or concerns. We can certainly see the patient much sooner if necessary.  Total encounter time:45 minutes*in face-to-face visit time, chart review, lab review, care coordination, order entry, and documentation of the encounter time.    Morna Kendall, NP 04/11/24 10:20 AM Medical Oncology and Hematology [facility] [address] [place], [place] [address] Tel. [phone]    Fax. [phone]  *Total Encounter Time as defined by the Centers for Medicare and Medicaid Services includes, in addition to the face-to-face time of a patient visit (documented in the note above) non-face-to-face time: obtaining and reviewing outside history, ordering and reviewing medications, tests or procedures, care coordination (communications with other health care  professionals or caregivers) and documentation in the medical record.

## 2024-04-11 ENCOUNTER — Ambulatory Visit: Payer: Self-pay | Admitting: *Deleted

## 2024-04-11 ENCOUNTER — Ambulatory Visit

## 2024-04-11 ENCOUNTER — Ambulatory Visit
Admission: RE | Admit: 2024-04-11 | Discharge: 2024-04-11 | Disposition: A | Discharge status: Home / Self Care | Source: Ambulatory Visit | Attending: Radiation Oncology | Admitting: Radiation Oncology

## 2024-04-11 ENCOUNTER — Other Ambulatory Visit: Payer: Self-pay

## 2024-04-11 ENCOUNTER — Telehealth: Payer: Self-pay | Admitting: Hematology and Oncology

## 2024-04-11 ENCOUNTER — Inpatient Hospital Stay

## 2024-04-11 ENCOUNTER — Encounter: Payer: Self-pay | Admitting: Hematology and Oncology

## 2024-04-11 DIAGNOSIS — C50912 Malignant neoplasm of unspecified site of left female breast: Secondary | ICD-10-CM | POA: Diagnosis not present

## 2024-04-11 DIAGNOSIS — Z95828 Presence of other vascular implants and grafts: Secondary | ICD-10-CM

## 2024-04-11 LAB — RAD ONC ARIA SESSION SUMMARY
Course Elapsed Days: 52
Plan Fractions Treated to Date: 4
Plan Prescribed Dose Per Fraction: 2 Gy
Plan Total Fractions Prescribed: 5
Plan Total Prescribed Dose: 10 Gy
Reference Point Dosage Given to Date: 8 Gy
Reference Point Session Dosage Given: 2 Gy
Session Number: 32

## 2024-04-11 MED ORDER — CYANOCOBALAMIN 1000 MCG/ML IJ SOLN
1000.0000 ug | Freq: Once | INTRAMUSCULAR | Status: AC
  Administered 2024-04-11: 1000 ug via INTRAMUSCULAR
  Filled 2024-04-11: qty 1

## 2024-04-11 NOTE — Patient Instructions (Signed)
 Vitamin B12 Injection What is this medication? Vitamin B12 (VAHY tuh min B12) prevents and treats low vitamin B12 levels in your body. It is used in people who do not get enough vitamin B12 from their diet or when their digestive tract does not absorb enough. Vitamin B12 plays an important role in maintaining the health of your nervous system and red blood cells. This medicine may be used for other purposes; ask your health care provider or pharmacist if you have questions. COMMON BRAND NAME(S): B-12 Compliance Kit, B-12 Injection Kit, Cyomin, Dodex, LA-12, Nutri-Twelve, Physicians EZ Use B-12, Primabalt, Vitamin Deficiency Injectable System - B12 What should I tell my care team before I take this medication? They need to know if you have any of these conditions: Kidney disease Leber's disease Megaloblastic anemia An unusual or allergic reaction to cyanocobalamin, cobalt, other medications, foods, dyes, or preservatives Pregnant or trying to get pregnant Breast-feeding How should I use this medication? This medication is injected into a muscle or deeply under the skin. It is usually given in a clinic or care team's office. However, your care team may teach you how to inject yourself. Follow all instructions. Talk to your care team about the use of this medication in children. Special care may be needed. Overdosage: If you think you have taken too much of this medicine contact a poison control center or emergency room at once. NOTE: This medicine is only for you. Do not share this medicine with others. What if I miss a dose? If you are given your dose at a clinic or care team's office, call to reschedule your appointment. If you give your own injections, and you miss a dose, take it as soon as you can. If it is almost time for your next dose, take only that dose. Do not take double or extra doses. What may interact with this medication? Alcohol Colchicine This list may not describe all possible  interactions. Give your health care provider a list of all the medicines, herbs, non-prescription drugs, or dietary supplements you use. Also tell them if you smoke, drink alcohol, or use illegal drugs. Some items may interact with your medicine. What should I watch for while using this medication? Visit your care team regularly. You may need blood work done while you are taking this medication. You may need to follow a special diet. Talk to your care team. Limit your alcohol intake and avoid smoking to get the best benefit. What side effects may I notice from receiving this medication? Side effects that you should report to your care team as soon as possible: Allergic reactions--skin rash, itching, hives, swelling of the face, lips, tongue, or throat Swelling of the ankles, hands, or feet Trouble breathing Side effects that usually do not require medical attention (report to your care team if they continue or are bothersome): Diarrhea This list may not describe all possible side effects. Call your doctor for medical advice about side effects. You may report side effects to FDA at 1-800-FDA-1088. Where should I keep my medication? Keep out of the reach of children. Store at room temperature between 15 and 30 degrees C (59 and 85 degrees F). Protect from light. Throw away any unused medication after the expiration date. NOTE: This sheet is a summary. It may not cover all possible information. If you have questions about this medicine, talk to your doctor, pharmacist, or health care provider.  2024 Elsevier/Gold Standard (2021-06-01 00:00:00)

## 2024-04-11 NOTE — Telephone Encounter (Signed)
 Pt called back and agreed to B12 injections from message below. Pt has been scheduled for injection today and scheduling message has been sent to scheduler to f/u with other injection appts.

## 2024-04-11 NOTE — Telephone Encounter (Signed)
-----   Message from Nurse Loralei Radcliffe C sent at 04/11/2024 11:36 AM EDT ----- Walterine Na, can you schedule this pt for 4 more days of B12 injections then weekly for 4 weeks then monthly. I know she's still doing treatment and radiation so some injections she can get with her chemo  treatment ----- Message ----- From: Vita Rudean CROME, LPN Sent: 2/0/7974   4:00 PM EDT To: [facility]  Awaiting pt call back ----- Message ----- From: Crawford Morna Pickle, NP Sent: 04/10/2024   3:46 PM EDT To: [facility]  Please call patient.  She has b12 deficiency and needs injections.  Can you let her know that these injections are going to be daily x 5 days, then weekly, then monthly.  If agreeable I will place  orders.  ----- Message ----- From: Rebecka, [facility] Sent: 04/10/2024  12:56 PM EDT To: Morna Pickle Crawford, NP

## 2024-04-11 NOTE — Telephone Encounter (Signed)
 Rescheule appointment per provider pal.  Called left VM with changes made to the upcoming appointment.

## 2024-04-12 ENCOUNTER — Ambulatory Visit (HOSPITAL_COMMUNITY)
Admission: RE | Admit: 2024-04-12 | Discharge: 2024-04-12 | Disposition: A | Discharge status: Home / Self Care | Source: Ambulatory Visit | Attending: Hematology and Oncology

## 2024-04-12 ENCOUNTER — Ambulatory Visit
Admission: RE | Admit: 2024-04-12 | Discharge: 2024-04-12 | Disposition: A | Discharge status: Home / Self Care | Source: Ambulatory Visit | Attending: Radiation Oncology | Admitting: Radiation Oncology

## 2024-04-12 ENCOUNTER — Ambulatory Visit
Admission: RE | Admit: 2024-04-12 | Discharge: 2024-04-12 | Disposition: A | Discharge status: Home / Self Care | Source: Ambulatory Visit | Attending: Radiation Oncology

## 2024-04-12 ENCOUNTER — Other Ambulatory Visit: Payer: Self-pay

## 2024-04-12 ENCOUNTER — Inpatient Hospital Stay

## 2024-04-12 VITALS — BP 142/72 | HR 78 | Temp 98.2°F | Resp 18

## 2024-04-12 DIAGNOSIS — Z171 Estrogen receptor negative status [ER-]: Secondary | ICD-10-CM | POA: Insufficient documentation

## 2024-04-12 DIAGNOSIS — N6091 Unspecified benign mammary dysplasia of right breast: Secondary | ICD-10-CM

## 2024-04-12 DIAGNOSIS — R401 Stupor: Secondary | ICD-10-CM

## 2024-04-12 DIAGNOSIS — D72829 Elevated white blood cell count, unspecified: Secondary | ICD-10-CM

## 2024-04-12 DIAGNOSIS — S32010A Wedge compression fracture of first lumbar vertebra, initial encounter for closed fracture: Secondary | ICD-10-CM

## 2024-04-12 DIAGNOSIS — E876 Hypokalemia: Secondary | ICD-10-CM

## 2024-04-12 DIAGNOSIS — M1612 Unilateral primary osteoarthritis, left hip: Secondary | ICD-10-CM

## 2024-04-12 DIAGNOSIS — C50912 Malignant neoplasm of unspecified site of left female breast: Secondary | ICD-10-CM | POA: Diagnosis not present

## 2024-04-12 DIAGNOSIS — C50212 Malignant neoplasm of upper-inner quadrant of left female breast: Secondary | ICD-10-CM | POA: Insufficient documentation

## 2024-04-12 DIAGNOSIS — Z95828 Presence of other vascular implants and grafts: Secondary | ICD-10-CM

## 2024-04-12 DIAGNOSIS — Y92009 Unspecified place in unspecified non-institutional (private) residence as the place of occurrence of the external cause: Secondary | ICD-10-CM

## 2024-04-12 LAB — RAD ONC ARIA SESSION SUMMARY
Course Elapsed Days: 53
Plan Fractions Treated to Date: 5
Plan Prescribed Dose Per Fraction: 2 Gy
Plan Total Fractions Prescribed: 5
Plan Total Prescribed Dose: 10 Gy
Reference Point Dosage Given to Date: 10 Gy
Reference Point Session Dosage Given: 2 Gy
Session Number: 33

## 2024-04-12 LAB — GLUCOSE, CAPILLARY: Glucose-Capillary: 109 mg/dL — ABNORMAL HIGH (ref 70–99)

## 2024-04-12 MED ORDER — FLUDEOXYGLUCOSE F - 18 (FDG) INJECTION
8.4500 | Freq: Once | INTRAVENOUS | Status: AC
  Administered 2024-04-12: 8.45 via INTRAVENOUS

## 2024-04-12 MED ORDER — FAMOTIDINE IN NACL 20-0.9 MG/50ML-% IV SOLN: 20.0000 mg | INTRAVENOUS | Status: DC | PRN

## 2024-04-12 MED ORDER — CYANOCOBALAMIN 1000 MCG/ML IJ SOLN
1000.0000 ug | Freq: Once | INTRAMUSCULAR | Status: AC
  Administered 2024-04-12: 1000 ug via INTRAMUSCULAR
  Filled 2024-04-12: qty 1

## 2024-04-12 MED ORDER — SODIUM CHLORIDE 0.9 % IV SOLN: INTRAVENOUS | Status: DC

## 2024-04-15 ENCOUNTER — Encounter (HOSPITAL_COMMUNITY): Payer: Self-pay | Admitting: General Surgery

## 2024-04-15 ENCOUNTER — Other Ambulatory Visit: Payer: Self-pay

## 2024-04-15 ENCOUNTER — Inpatient Hospital Stay

## 2024-04-15 NOTE — Radiation Completion Notes (Addendum)
  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   Intent: Curative     ==========DELIVERED PLANS==========  First Treatment Date: 2024-02-19 Last Treatment Date: 2024-04-12   Plan Name: CW_L_BO_BH Site: Chest Wall, Left Technique: 3D Mode: Photon Dose Per Fraction: 1.8 Gy Prescribed Dose (Delivered / Prescribed): 25.2 Gy / 25.2 Gy Prescribed Fxs (Delivered / Prescribed): 14 / 14   Plan Name: CW_L_SCV_BH Site: Chest Wall, Left Technique: 3D Mode: Photon Dose Per Fraction: 1.8 Gy Prescribed Dose (Delivered / Prescribed): 50.4 Gy / 50.4 Gy Prescribed Fxs (Delivered / Prescribed): 28 / 28   Plan Name: CW_L_Bst_BO Site: Chest Wall, Left Technique: Electron Mode: Electron Dose Per Fraction: 2 Gy Prescribed Dose (Delivered / Prescribed): 10 Gy / 10 Gy Prescribed Fxs (Delivered / Prescribed): 5 / 5   Plan Name: CW_L_BH Site: Chest Wall, Left Technique: 3D Mode: Photon Dose Per Fraction: 1.8 Gy Prescribed Dose (Delivered / Prescribed): 25.2 Gy / 25.2 Gy Prescribed Fxs (Delivered / Prescribed): 14 / 14     ==========ON TREATMENT VISIT DATES========== 2024-02-23, 2024-03-01, 2024-03-08, 2024-03-15, 2024-03-22, 2024-04-09, 2024-04-12    See weekly On Treatment Notes in Epic for details in the Media tab (listed as Progress notes on the On Treatment Visit Dates listed above). The patient tolerated radiation. She developed fatigue and anticipated skin changes in the treatment field. She was still able to finish treatment, but was also found to have a new right breast cancer and will be undergoing evaluation and surgery with Dr. Ebbie for that cancer.  The patient will receive a call in about one month from the radiation oncology department. She  will continue follow up with Dr. Loretha as well.      Donald KYM Husband, PAC

## 2024-04-15 NOTE — Progress Notes (Signed)
 SDW CALL  Patient was given pre-op instructions over the phone. The opportunity was given for the patient to ask questions. No further questions asked. Patient verbalized understanding of instructions given.   PCP - Jon Perry,MD Cardiologist - denies  PPM/ICD - denies Device Orders -  Rep Notified -   Chest x-ray - na EKG - 04/01/24 Stress Test - denies ECHO - 01/12/24 Cardiac Cath - denies  Sleep Study - pt does not remember when sleep study was performed:states hse has mild sleep apnea that does not require a CPAP CPAP - no  Fasting Blood Sugar - na Checks Blood Sugar _____ times a day  Blood Thinner Instructions:na Aspirin  Instructions:na  ERAS Protcol - clear liquids until 1100 PRE-SURGERY Ensure or G2- no  COVID TEST- na   Anesthesia review: no  Patient denies shortness of breath, fever, cough and chest pain over the phone call   As of today, STOP taking any Aspirin  (unless otherwise instructed by your surgeon) Aleve, Naproxen, Ibuprofen, Motrin, Advil, Goody's, BC's, all herbal medications, fish oil, and all vitamins.   Special instructions:    Oral Hygiene is also important to reduce your risk of infection.  Remember - BRUSH YOUR TEETH THE MORNING OF SURGERY WITH YOUR REGULAR TOOTHPASTE

## 2024-04-16 ENCOUNTER — Other Ambulatory Visit: Payer: Self-pay

## 2024-04-16 ENCOUNTER — Inpatient Hospital Stay

## 2024-04-16 ENCOUNTER — Encounter (HOSPITAL_COMMUNITY)
Admission: RE | Disposition: A | Discharge status: Home / Self Care | Payer: Self-pay | Source: Home / Self Care | Attending: General Surgery

## 2024-04-16 ENCOUNTER — Encounter (HOSPITAL_COMMUNITY): Payer: Self-pay | Admitting: General Surgery

## 2024-04-16 ENCOUNTER — Ambulatory Visit (HOSPITAL_COMMUNITY): Payer: Self-pay | Admitting: Anesthesiology

## 2024-04-16 ENCOUNTER — Observation Stay (HOSPITAL_COMMUNITY)
Admission: RE | Admit: 2024-04-16 | Discharge: 2024-04-18 | Disposition: A | Discharge status: Home / Self Care | Attending: General Surgery | Admitting: General Surgery

## 2024-04-16 DIAGNOSIS — C50911 Malignant neoplasm of unspecified site of right female breast: Secondary | ICD-10-CM

## 2024-04-16 DIAGNOSIS — I509 Heart failure, unspecified: Secondary | ICD-10-CM | POA: Diagnosis not present

## 2024-04-16 DIAGNOSIS — Z170 Estrogen receptor positive status [ER+]: Secondary | ICD-10-CM | POA: Insufficient documentation

## 2024-04-16 DIAGNOSIS — I110 Hypertensive heart disease with heart failure: Secondary | ICD-10-CM | POA: Diagnosis not present

## 2024-04-16 DIAGNOSIS — Z96649 Presence of unspecified artificial hip joint: Secondary | ICD-10-CM | POA: Diagnosis not present

## 2024-04-16 DIAGNOSIS — C50311 Malignant neoplasm of lower-inner quadrant of right female breast: Secondary | ICD-10-CM | POA: Diagnosis present

## 2024-04-16 DIAGNOSIS — Z79899 Other long term (current) drug therapy: Secondary | ICD-10-CM | POA: Diagnosis not present

## 2024-04-16 DIAGNOSIS — Z9011 Acquired absence of right breast and nipple: Principal | ICD-10-CM

## 2024-04-16 DIAGNOSIS — R2681 Unsteadiness on feet: Secondary | ICD-10-CM | POA: Insufficient documentation

## 2024-04-16 DIAGNOSIS — F32A Depression, unspecified: Secondary | ICD-10-CM | POA: Diagnosis not present

## 2024-04-16 DIAGNOSIS — Z7982 Long term (current) use of aspirin: Secondary | ICD-10-CM | POA: Insufficient documentation

## 2024-04-16 DIAGNOSIS — E039 Hypothyroidism, unspecified: Secondary | ICD-10-CM

## 2024-04-16 HISTORY — DX: Headache, unspecified: R51.9

## 2024-04-16 HISTORY — PX: AXILLARY SENTINEL NODE BIOPSY: SHX5738

## 2024-04-16 HISTORY — PX: TOTAL MASTECTOMY: SHX6129

## 2024-04-16 HISTORY — DX: Drug-induced polyneuropathy: G62.0

## 2024-04-16 SURGERY — MASTECTOMY, SIMPLE
Anesthesia: General | Site: Breast | Laterality: Right

## 2024-04-16 MED ORDER — PHENYLEPHRINE HCL-NACL 20-0.9 MG/250ML-% IV SOLN
INTRAVENOUS | Status: DC | PRN
  Administered 2024-04-16: 25 ug/min via INTRAVENOUS

## 2024-04-16 MED ORDER — CHLORHEXIDINE GLUCONATE CLOTH 2 % EX PADS: 6.0000 | MEDICATED_PAD | Freq: Once | CUTANEOUS | Status: DC

## 2024-04-16 MED ORDER — CHLORHEXIDINE GLUCONATE 0.12 % MT SOLN: 15.0000 mL | Freq: Once | OROMUCOSAL | Status: AC

## 2024-04-16 MED ORDER — ONDANSETRON HCL 4 MG/2ML IJ SOLN: 4.0000 mg | Freq: Four times a day (QID) | INTRAMUSCULAR | Status: DC | PRN

## 2024-04-16 MED ORDER — ENSURE PRE-SURGERY PO LIQD: 296.0000 mL | Freq: Once | ORAL | Status: DC

## 2024-04-16 MED ORDER — ACETAMINOPHEN 325 MG PO TABS
ORAL_TABLET | ORAL | Status: AC
  Administered 2024-04-16: 975 mg via ORAL
  Filled 2024-04-16: qty 3

## 2024-04-16 MED ORDER — PHENYLEPHRINE 80 MCG/ML (10ML) SYRINGE FOR IV PUSH (FOR BLOOD PRESSURE SUPPORT)
PREFILLED_SYRINGE | INTRAVENOUS | Status: DC | PRN
  Administered 2024-04-16 (×3): 80 ug via INTRAVENOUS

## 2024-04-16 MED ORDER — HYDROMORPHONE HCL 1 MG/ML IJ SOLN: 0.2500 mg | INTRAMUSCULAR | Status: DC | PRN

## 2024-04-16 MED ORDER — PROPOFOL 500 MG/50ML IV EMUL
INTRAVENOUS | Status: DC | PRN
Start: 2024-04-16 — End: 2024-04-16
  Administered 2024-04-16: 125 ug/kg/min via INTRAVENOUS

## 2024-04-16 MED ORDER — TRANEXAMIC ACID 1000 MG/10ML IV SOLN
2000.0000 mg | Freq: Once | INTRAVENOUS | Status: AC
  Administered 2024-04-16: 2000 mg via TOPICAL
  Filled 2024-04-16: qty 20

## 2024-04-16 MED ORDER — FENTANYL CITRATE (PF) 100 MCG/2ML IJ SOLN: 100.0000 ug | Freq: Once | INTRAMUSCULAR | Status: AC

## 2024-04-16 MED ORDER — LORATADINE 10 MG PO TABS
10.0000 mg | ORAL_TABLET | Freq: Every day | ORAL | Status: DC
  Administered 2024-04-16 – 2024-04-18 (×3): 10 mg via ORAL
  Filled 2024-04-16 (×3): qty 1

## 2024-04-16 MED ORDER — OLANZAPINE 5 MG PO TABS
2.5000 mg | ORAL_TABLET | Freq: Every day | ORAL | Status: DC
  Administered 2024-04-16 – 2024-04-17 (×2): 2.5 mg via ORAL
  Filled 2024-04-16 (×2): qty 1

## 2024-04-16 MED ORDER — AMISULPRIDE (ANTIEMETIC) 5 MG/2ML IV SOLN: 10.0000 mg | Freq: Once | INTRAVENOUS | Status: DC | PRN

## 2024-04-16 MED ORDER — LIDOCAINE 2% (20 MG/ML) 5 ML SYRINGE
INTRAMUSCULAR | Status: DC | PRN
  Administered 2024-04-16: 100 mg via INTRAVENOUS

## 2024-04-16 MED ORDER — TRAZODONE HCL 100 MG PO TABS
100.0000 mg | ORAL_TABLET | Freq: Every day | ORAL | Status: DC
  Administered 2024-04-16 – 2024-04-17 (×2): 100 mg via ORAL
  Filled 2024-04-16 (×2): qty 1

## 2024-04-16 MED ORDER — FENTANYL CITRATE (PF) 250 MCG/5ML IJ SOLN
INTRAMUSCULAR | Status: AC
  Filled 2024-04-16: qty 5

## 2024-04-16 MED ORDER — TRAMADOL HCL 50 MG PO TABS
50.0000 mg | ORAL_TABLET | Freq: Four times a day (QID) | ORAL | Status: DC | PRN
  Administered 2024-04-16 – 2024-04-17 (×2): 50 mg via ORAL
  Filled 2024-04-16 (×2): qty 1

## 2024-04-16 MED ORDER — MONTELUKAST SODIUM 10 MG PO TABS
10.0000 mg | ORAL_TABLET | Freq: Every day | ORAL | Status: DC
  Administered 2024-04-16 – 2024-04-17 (×2): 10 mg via ORAL
  Filled 2024-04-16 (×2): qty 1

## 2024-04-16 MED ORDER — SODIUM CHLORIDE 0.9 % IV SOLN: INTRAVENOUS | Status: DC

## 2024-04-16 MED ORDER — ACETAMINOPHEN 500 MG PO TABS
1000.0000 mg | ORAL_TABLET | ORAL | Status: DC
  Filled 2024-04-16: qty 2

## 2024-04-16 MED ORDER — 0.9 % SODIUM CHLORIDE (POUR BTL) OPTIME
TOPICAL | Status: DC | PRN
  Administered 2024-04-16: 1000 mL

## 2024-04-16 MED ORDER — MAGTRACE LYMPHATIC TRACER
INTRAMUSCULAR | Status: DC | PRN
  Administered 2024-04-16: 2 mL via INTRAMUSCULAR

## 2024-04-16 MED ORDER — BUPIVACAINE HCL (PF) 0.25 % IJ SOLN
INTRAMUSCULAR | Status: DC | PRN
Start: 2024-04-16 — End: 2024-04-16
  Administered 2024-04-16: 10 mL via PERINEURAL
  Administered 2024-04-16: 20 mL via PERINEURAL

## 2024-04-16 MED ORDER — PANTOPRAZOLE SODIUM 40 MG PO TBEC
40.0000 mg | DELAYED_RELEASE_TABLET | Freq: Every day | ORAL | Status: DC
  Administered 2024-04-16 – 2024-04-18 (×3): 40 mg via ORAL
  Filled 2024-04-16 (×3): qty 1

## 2024-04-16 MED ORDER — BUPIVACAINE-EPINEPHRINE (PF) 0.25% -1:200000 IJ SOLN
INTRAMUSCULAR | Status: AC
  Filled 2024-04-16: qty 30

## 2024-04-16 MED ORDER — ONDANSETRON HCL 4 MG/2ML IJ SOLN
INTRAMUSCULAR | Status: DC | PRN
  Administered 2024-04-16: 4 mg via INTRAVENOUS

## 2024-04-16 MED ORDER — METOCLOPRAMIDE HCL 5 MG PO TABS: 10.0000 mg | ORAL_TABLET | Freq: Four times a day (QID) | ORAL | Status: DC | PRN

## 2024-04-16 MED ORDER — METHOCARBAMOL 1000 MG/10ML IJ SOLN: 500.0000 mg | Freq: Three times a day (TID) | INTRAMUSCULAR | Status: DC | PRN

## 2024-04-16 MED ORDER — ORAL CARE MOUTH RINSE
15.0000 mL | Freq: Once | OROMUCOSAL | Status: AC
  Administered 2024-04-16: 15 mL via OROMUCOSAL

## 2024-04-16 MED ORDER — LEVOTHYROXINE SODIUM 75 MCG PO TABS
75.0000 ug | ORAL_TABLET | ORAL | Status: DC
  Administered 2024-04-17 – 2024-04-18 (×2): 75 ug via ORAL
  Filled 2024-04-16 (×2): qty 1

## 2024-04-16 MED ORDER — TRANEXAMIC ACID-NACL 1000-0.7 MG/100ML-% IV SOLN
INTRAVENOUS | Status: AC
Start: 2024-04-16 — End: 2024-04-16
  Filled 2024-04-16: qty 100

## 2024-04-16 MED ORDER — LACTATED RINGERS IV SOLN: INTRAVENOUS | Status: DC

## 2024-04-16 MED ORDER — LACTATED RINGERS IV SOLN
INTRAVENOUS | Status: DC | PRN
Start: 2024-04-16 — End: 2024-04-16

## 2024-04-16 MED ORDER — ACETAMINOPHEN 325 MG PO TABS
975.0000 mg | ORAL_TABLET | Freq: Once | ORAL | Status: AC
  Filled 2024-04-16: qty 3

## 2024-04-16 MED ORDER — CEFAZOLIN SODIUM-DEXTROSE 2-4 GM/100ML-% IV SOLN
2.0000 g | INTRAVENOUS | Status: AC
  Administered 2024-04-16: 2 g via INTRAVENOUS
  Filled 2024-04-16: qty 100

## 2024-04-16 MED ORDER — ONDANSETRON 4 MG PO TBDP
4.0000 mg | ORAL_TABLET | Freq: Four times a day (QID) | ORAL | Status: DC | PRN
Start: 2024-04-16 — End: 2024-04-18

## 2024-04-16 MED ORDER — FENTANYL CITRATE (PF) 100 MCG/2ML IJ SOLN
INTRAMUSCULAR | Status: DC | PRN
  Administered 2024-04-16: 50 ug via INTRAVENOUS

## 2024-04-16 MED ORDER — PROPOFOL 10 MG/ML IV BOLUS
INTRAVENOUS | Status: DC | PRN
  Administered 2024-04-16: 50 mg via INTRAVENOUS
  Administered 2024-04-16: 150 mg via INTRAVENOUS

## 2024-04-16 MED ORDER — FENTANYL CITRATE (PF) 100 MCG/2ML IJ SOLN
INTRAMUSCULAR | Status: AC
  Administered 2024-04-16: 100 ug via INTRAVENOUS
  Filled 2024-04-16: qty 2

## 2024-04-16 MED ORDER — MIDAZOLAM HCL 2 MG/2ML IJ SOLN
INTRAMUSCULAR | Status: AC
  Filled 2024-04-16: qty 2

## 2024-04-16 MED ORDER — DEXAMETHASONE SODIUM PHOSPHATE 10 MG/ML IJ SOLN
INTRAMUSCULAR | Status: DC | PRN
  Administered 2024-04-16: 10 mg via INTRAVENOUS

## 2024-04-16 MED ORDER — METHOCARBAMOL 500 MG PO TABS
500.0000 mg | ORAL_TABLET | Freq: Three times a day (TID) | ORAL | Status: DC | PRN
  Administered 2024-04-16: 500 mg via ORAL
  Filled 2024-04-16: qty 1

## 2024-04-16 SURGICAL SUPPLY — 53 items
BENZOIN TINCTURE PRP APPL 2/3 (GAUZE/BANDAGES/DRESSINGS) IMPLANT
BINDER BREAST LRG (GAUZE/BANDAGES/DRESSINGS) IMPLANT
BINDER BREAST XLRG (GAUZE/BANDAGES/DRESSINGS) IMPLANT
BIOPATCH RED 1 DISK 7.0 (GAUZE/BANDAGES/DRESSINGS) ×2 IMPLANT
CANISTER SUCTION 3000ML PPV (SUCTIONS) ×2 IMPLANT
CHLORAPREP W/TINT 26 (MISCELLANEOUS) ×2 IMPLANT
CLIP APPLIE 9.375 MED OPEN (MISCELLANEOUS) ×2 IMPLANT
CNTNR URN SCR LID CUP LEK RST (MISCELLANEOUS) ×2 IMPLANT
COVER PROBE W GEL 5X96 (DRAPES) ×2 IMPLANT
COVER SURGICAL LIGHT HANDLE (MISCELLANEOUS) ×2 IMPLANT
DERMABOND ADVANCED .7 DNX12 (GAUZE/BANDAGES/DRESSINGS) ×2 IMPLANT
DRAIN CHANNEL 19F RND (DRAIN) ×2 IMPLANT
DRAPE CHEST BREAST 15X10 FENES (DRAPES) ×2 IMPLANT
DRAPE TOP ARMCOVERS (MISCELLANEOUS) ×2 IMPLANT
DRAPE U-SHAPE 76X120 STRL (DRAPES) ×2 IMPLANT
DRSG TEGADERM 4X4.75 (GAUZE/BANDAGES/DRESSINGS) ×2 IMPLANT
ELECT CAUTERY BLADE 6.4 (BLADE) ×2 IMPLANT
ELECTRODE BLDE 4.0 EZ CLN MEGD (MISCELLANEOUS) ×2 IMPLANT
ELECTRODE REM PT RTRN 9FT ADLT (ELECTROSURGICAL) ×2 IMPLANT
EVACUATOR SILICONE 100CC (DRAIN) ×2 IMPLANT
GAUZE 4X4 16PLY ~~LOC~~+RFID DBL (SPONGE) ×2 IMPLANT
GAUZE PAD ABD 8X10 STRL (GAUZE/BANDAGES/DRESSINGS) ×4 IMPLANT
GAUZE SPONGE 4X4 12PLY STRL (GAUZE/BANDAGES/DRESSINGS) IMPLANT
GLOVE BIO SURGEON STRL SZ7 (GLOVE) ×2 IMPLANT
GLOVE BIOGEL PI IND STRL 7.5 (GLOVE) ×2 IMPLANT
GOWN STRL REUS W/ TWL LRG LVL3 (GOWN DISPOSABLE) ×6 IMPLANT
ILLUMINATOR WAVEGUIDE N/F (MISCELLANEOUS) IMPLANT
KIT BASIN OR (CUSTOM PROCEDURE TRAY) ×2 IMPLANT
KIT TURNOVER KIT B (KITS) ×2 IMPLANT
LIGHT WAVEGUIDE WIDE FLAT (MISCELLANEOUS) IMPLANT
NDL 18GX1X1/2 (RX/OR ONLY) (NEEDLE) IMPLANT
NDL FILTER BLUNT 18X1 1/2 (NEEDLE) IMPLANT
NDL HYPO 25GX1X1/2 BEV (NEEDLE) ×2 IMPLANT
NEEDLE 18GX1X1/2 (RX/OR ONLY) (NEEDLE) IMPLANT
NEEDLE FILTER BLUNT 18X1 1/2 (NEEDLE) IMPLANT
NEEDLE HYPO 25GX1X1/2 BEV (NEEDLE) ×2 IMPLANT
NS IRRIG 1000ML POUR BTL (IV SOLUTION) ×2 IMPLANT
PACK GENERAL/GYN (CUSTOM PROCEDURE TRAY) ×2 IMPLANT
PAD ARMBOARD POSITIONER FOAM (MISCELLANEOUS) ×2 IMPLANT
PENCIL SMOKE EVACUATOR (MISCELLANEOUS) ×2 IMPLANT
POWDER SURGICEL 3.0 GRAM (HEMOSTASIS) IMPLANT
SPIKE FLUID TRANSFER (MISCELLANEOUS) ×2 IMPLANT
STRIP CLOSURE SKIN 1/2X4 (GAUZE/BANDAGES/DRESSINGS) ×2 IMPLANT
SUT ETHILON 2 0 FS 18 (SUTURE) ×2 IMPLANT
SUT MNCRL AB 4-0 PS2 18 (SUTURE) ×2 IMPLANT
SUT SILK 2 0 SH (SUTURE) ×2 IMPLANT
SUT VIC AB 2-0 SH 27XBRD (SUTURE) ×2 IMPLANT
SUT VIC AB 3-0 SH 18 (SUTURE) ×2 IMPLANT
SUT VIC AB 3-0 SH 27XBRD (SUTURE) ×2 IMPLANT
SUT VIC AB 3-0 SH 8-18 (SUTURE) IMPLANT
SYR CONTROL 10ML LL (SYRINGE) ×2 IMPLANT
TOWEL GREEN STERILE (TOWEL DISPOSABLE) ×2 IMPLANT
TOWEL GREEN STERILE FF (TOWEL DISPOSABLE) ×2 IMPLANT

## 2024-04-16 NOTE — Transfer of Care (Signed)
 Immediate Anesthesia Transfer of Care Note  Patient: Rebecca Steele  Procedure(s) Performed: MASTECTOMY, SIMPLE (Right: Breast) BIOPSY, LYMPH NODE, SENTINEL, AXILLARY (Right)  Patient Location: PACU  Anesthesia Type:GA combined with regional for post-op pain  Level of Consciousness: drowsy and patient cooperative  Airway & Oxygen Therapy: Patient Spontanous Breathing and Patient connected to face mask oxygen  Post-op Assessment: Report given to RN and Post -op Vital signs reviewed and stable  Post vital signs: Reviewed and stable  Last Vitals:  Vitals Value Taken Time  BP 128/96 04/16/24 15:02  Temp    Pulse 86 04/16/24 15:04  Resp 19 04/16/24 15:04  SpO2 99 % 04/16/24 15:04  Vitals shown include unfiled device data.  Last Pain:  Vitals:   04/16/24 1250  TempSrc:   PainSc: 0-No pain         Complications: No notable events documented.

## 2024-04-16 NOTE — Anesthesia Procedure Notes (Signed)
 Anesthesia Regional Block: Pectoralis block   Pre-Anesthetic Checklist: , timeout performed,  Correct Patient, Correct Site, Correct Laterality,  Correct Procedure, Correct Position, site marked,  Risks and benefits discussed,  Surgical consent,  Pre-op evaluation,  At surgeon's request and post-op pain management  Laterality: Right  Prep: chloraprep       Needles:  Injection technique: Single-shot  Needle Type: Echogenic Stimulator Needle     Needle Length: 9cm  Needle Gauge: 21     Additional Needles:   Procedures:,,,, ultrasound used (permanent image in chart),,    Narrative:  Start time: 04/16/2024 12:32 PM End time: 04/16/2024 12:37 PM Injection made incrementally with aspirations every 5 mL.  Performed by: Personally  Anesthesiologist: Peggye Delon Brunswick, MD  Additional Notes: Discussed risks and benefits of nerve block including, but not limited to, prolonged and/or permanent nerve injury involving sensory and/or motor function. Monitors were applied and a time-out was performed. The nerve and associated structures were visualized under ultrasound guidance. After negative aspiration, local anesthetic was slowly injected around the nerve. There was no evidence of high pressure during the procedure. There were no paresthesias. VSS remained stable and the patient tolerated the procedure well.

## 2024-04-16 NOTE — Anesthesia Postprocedure Evaluation (Signed)
 Anesthesia Post Note  Patient: Rebecca Steele  Procedure(s) Performed: MASTECTOMY, SIMPLE (Right: Breast) BIOPSY, LYMPH NODE, SENTINEL, AXILLARY (Right)     Patient location during evaluation: PACU Anesthesia Type: General Level of consciousness: awake Pain management: pain level controlled Vital Signs Assessment: post-procedure vital signs reviewed and stable Respiratory status: spontaneous breathing, nonlabored ventilation and respiratory function stable Cardiovascular status: blood pressure returned to baseline and stable Postop Assessment: no apparent nausea or vomiting Anesthetic complications: no   No notable events documented.  Last Vitals:  Vitals:   04/16/24 1545 04/16/24 1600  BP: (!) 149/77 (!) 144/77  Pulse: 78 79  Resp: 14 14  Temp:    SpO2: 95% 93%    Last Pain:  Vitals:   04/16/24 1600  TempSrc:   PainSc: 0-No pain                 Delon Aisha Arch

## 2024-04-16 NOTE — Progress Notes (Signed)
Pt arrived from PACU

## 2024-04-16 NOTE — Interval H&P Note (Signed)
 History and Physical Interval Note:  04/16/2024 1:11 PM  Rebecca Steele  has presented today for surgery, with the diagnosis of RIGHT BREAST CANCER.  The various methods of treatment have been discussed with the patient and family. After consideration of risks, benefits and other options for treatment, the patient has consented to  Procedure(s) with comments: MASTECTOMY, SIMPLE (Right) BIOPSY, LYMPH NODE, SENTINEL, AXILLARY (Right) - RIGHT MASTY RIGHT AXILLARY SENTINEL NODE BIOPSY as a surgical intervention.  The patient's history has been reviewed, patient examined, no change in status, stable for surgery.  I have reviewed the patient's chart and labs.  Questions were answered to the patient's satisfaction.     Donnice Bury

## 2024-04-16 NOTE — Anesthesia Preprocedure Evaluation (Addendum)
 Anesthesia Evaluation  Patient identified by MRN, date of birth, ID band Patient awake    Reviewed: Allergy & Precautions, NPO status , Patient's Chart, lab work & pertinent test results  History of Anesthesia Complications (+) PROLONGED EMERGENCE and history of anesthetic complications (stiff neck, hard to wake up after given gabapentin  and fentanyl )  Airway Mallampati: III  TM Distance: >3 FB Neck ROM: Full   Comment: Previous grade II view with MAC 3, easy mask Dental  (+) Dental Advisory Given   Pulmonary neg shortness of breath, sleep apnea (does not use CPAP) , neg COPD, neg recent URI   Pulmonary exam normal breath sounds clear to auscultation       Cardiovascular (-) hypertension(-) angina +CHF  (-) Past MI, (-) Cardiac Stents and (-) CABG + Valvular Problems/Murmurs MVP  Rhythm:Regular Rate:Normal  TTE 01/12/2024: IMPRESSIONS    1. Left ventricular ejection fraction, by estimation, is 55 to 60%. Left  ventricular ejection fraction by 3D volume is 58 %. The left ventricle has  normal function. The left ventricle has no regional wall motion  abnormalities. Left ventricular diastolic   parameters are consistent with Grade I diastolic dysfunction (impaired  relaxation). The average left ventricular global longitudinal strain is  -15.4 %. The global longitudinal strain is abnormal but tracking of  lateral wall appeared suboptimal.   2. Right ventricular systolic function is normal. The right ventricular  size is normal. There is normal pulmonary artery systolic pressure. The  estimated right ventricular systolic pressure is 32.2 mmHg.   3. The mitral valve is normal in structure. Trivial mitral valve  regurgitation. No evidence of mitral stenosis.   4. The aortic valve is tricuspid. There is mild calcification of the  aortic valve. Aortic valve regurgitation is not visualized. No aortic  stenosis is present.   5. The  inferior vena cava is dilated in size with >50% respiratory  variability, suggesting right atrial pressure of 8 mmHg.     Neuro/Psych  Headaches, neg Seizures PSYCHIATRIC DISORDERS  Depression     Neuromuscular disease (neuropathy)    GI/Hepatic Neg liver ROS,GERD  Medicated,,  Endo/Other  neg diabetesHypothyroidism    Renal/GU negative Renal ROS     Musculoskeletal  (+) Arthritis ,    Abdominal   Peds  Hematology negative hematology ROS (+)   Anesthesia Other Findings Did okay last time with fentanyl  and no gabapentin .  She has not had any swelling in her left arm since her surgery. She has neuropathy in both feet.  Reproductive/Obstetrics Right breast cancer                              Anesthesia Physical Anesthesia Plan  ASA: 3  Anesthesia Plan: General   Post-op Pain Management: Tylenol  PO (pre-op)*   Induction: Intravenous  PONV Risk Score and Plan: 3 and Ondansetron , Dexamethasone , Propofol  infusion, TIVA and Treatment may vary due to age or medical condition  Airway Management Planned: LMA  Additional Equipment:   Intra-op Plan:   Post-operative Plan: Extubation in OR  Informed Consent: I have reviewed the patients History and Physical, chart, labs and discussed the procedure including the risks, benefits and alternatives for the proposed anesthesia with the patient or authorized representative who has indicated his/her understanding and acceptance.     Dental advisory given  Plan Discussed with: CRNA and Anesthesiologist  Anesthesia Plan Comments: (Risks of general anesthesia discussed including, but not limited to,  sore throat, hoarse voice, chipped/damaged teeth, injury to vocal cords, nausea and vomiting, allergic reactions, lung infection, heart attack, stroke, and death. All questions answered. )         Anesthesia Quick Evaluation

## 2024-04-16 NOTE — H&P (Signed)
 72 yof s/p left mastectomy after prior attempt at primary chemotherapy then lumpectomy/sn biopsy. She has drain out. Path was residual grade III IDC that involved lumpectomy site, new margins negative. One IM node negative. She is almost done with radiotherapy. She has had n/v and admitted for this recently. She had a ct scan in June that showed a right breast mass. She now has had mm/us  that shows a 1.6 cm mass with no abnormal nodes. Biopsy is grade II IDC that is 100% er and pr pos, her 2 negative and Ki is 10%. She is slowly recovering and here with her daughter and husband today  Review of Systems: A complete review of systems was obtained from the patient. I have reviewed this information and discussed as appropriate with the patient. See HPI as well for other ROS.  Review of Systems  Constitutional: Positive for malaise/fatigue.  All other systems reviewed and are negative.  Medical History: Past Medical History:  Diagnosis Date  Anxiety  Hypertension  Thyroid  disease   Patient Active Problem List  Diagnosis  Invasive ductal carcinoma of breast, left (CMS/HHS-HCC)  Breast cancer of lower-inner quadrant of right female breast (CMS/HHS-HCC)   Past Surgical History:  Procedure Laterality Date  APPENDECTOMY  ARTHROPLASTY HIP TOTAL  BREAST EXCISIONAL BIOPSY Right  CHOLECYSTECTOMY  KYPHOPLASTY  Radioactive seed guided excisional breast biopsy  TONSILLECTOMY    Allergies  Allergen Reactions  Diazepam Anaphylaxis, Palpitations, Other (See Comments) and Shortness Of Breath  Gadolinium-Containing Contrast Media Anaphylaxis, Other (See Comments), Shortness Of Breath and Unknown  Iodine  Anaphylaxis  Iohexol  Itching  Doxycycline  Swelling  Facial swelling  Doxycycline  Calcium  Swelling  Gabapentin  Hives and Other (See Comments)  Nirmatrelvir-Ritonavir Hives  Nitrofurantoin Nausea And Vomiting and Vomiting  Red Dye Swelling  Facial swelling (cannot take pink or red tablets or  capsules)   Current Outpatient Medications on File Prior to Visit  Medication Sig Dispense Refill  levothyroxine  (SYNTHROID ) 75 MCG tablet TAKE 1 TABLET BY MOUTH EVERY DAY IN THE MORNING ON EMPTY STOMACH  loratadine  (CLARITIN ) 10 mg tablet Take by mouth  montelukast  (SINGULAIR ) 10 mg tablet Take 1 tablet by mouth once daily  potassium acetate 2 mEq/mL injection  albuterol  90 mcg/actuation inhaler INHALE 1-2 PUFFS EVERY 6 HOURS AS NEEDED  aspirin  325 MG EC tablet Take by mouth  oxyBUTYnin  (DITROPAN -XL) 5 MG XL tablet Take by mouth  sulfamethoxazole -trimethoprim  (BACTRIM  DS) 800-160 mg tablet (Patient not taking: Reported on 04/11/2024)     Family History  Problem Relation Age of Onset  Skin cancer Mother  High blood pressure (Hypertension) Mother  Hyperlipidemia (Elevated cholesterol) Mother  Heart valve disease Father  High blood pressure (Hypertension) Father  Hyperlipidemia (Elevated cholesterol) Father  Diabetes Father    Social History   Tobacco Use  Smoking Status Never  Smokeless Tobacco Never  Marital status: Married  Tobacco Use  Smoking status: Never  Smokeless tobacco: Never  Vaping Use  Vaping status: Never Used  Substance and Sexual Activity  Alcohol  use: Never  Drug use: Never   Objective:   Vitals:   Physical Exam Vitals reviewed.  Constitutional:  Appearance: Normal appearance.  Chest:  Breasts: Right: No inverted nipple, mass or nipple discharge.  Comments: Left mastectomy scar with radiotherapy changes Significant desquamation Neurological:  Mental Status: She is alert.    Assessment and Plan:   Malignant neoplasm of lower-inner quadrant of right breast of female, estrogen receptor positive (CMS/HHS-HCC)  Right mastectomy/sn biopsy  Discussed new cancer. I think  she is ready for surgery and she does not want to delay. She has PET tomorrow that will await results. As long as this is ok will proceed with right mastectomy/sn biopsy as  discussed

## 2024-04-16 NOTE — Op Note (Signed)
 Preoperative diagnosis: Clinical stage I right breast cancer Postoperative diagnosis: Same as above Procedure: 1.  Right mastectomy 2.  Injection of mag trace for sentinel lymph node identification 3.  Right deep axillary sentinel lymph node biopsy Surgeon: Dr. Adina Bury Anesthesia: General With a pectoral block Estimated blood loss: Less than 50 cc Specimens: 1.  Right mastectomy marked short superior, long lateral 2.  Right deep axillary sentinel lymph node with highest count of 2248 Drains: 19 French Blake drain placed mastectomy space Sponge needle count was correct completion Disposition to recovery in stable addition  Indications:72 yof s/p left mastectomy after prior attempt at primary chemotherapy then lumpectomy/sn biopsy. She has drain out. Path was residual grade III IDC that involved lumpectomy site, new margins negative. One IM node negative. She is almost done with radiotherapy. She has had n/v and admitted for this recently. She had a ct scan in June that showed a right breast mass. She now has had mm/us  that shows a 1.6 cm mass with no abnormal nodes. Biopsy is grade II IDC that is 100% er and pr pos, her 2 negative and Ki is 10%.  We discussed a mastectomy and a sentinel node on the side as well.  Procedure: After informed consent was obtained she was taken to the operating room.  She had first undergone a pectoral block.  She was given antibiotics.  SCDs were in place.  She was placed under general anesthesia without complication.  She was prepped and draped in standard sterile surgical fashion.  A surgical timeout was then performed.  I made a large elliptical incision to include the nipple areolar complex in order to make this flat later.  I created flaps to the sternum, clavicle and I lifted the port up to do this.  I made flaps to the inframammary fold I also undermined this for several centimeters later as well.  The flap was taken to the lateral portion of the breast  at the latissimus.  The breast and the fascia were then removed from the pectoralis muscle.  This was passed off the table.  The sentinel node was noted to be in the tail of the breast and this was brown with the activity as noted above.  There were no additional sentinel lymph nodes to be identified.  I then obtained hemostasis.  I placed a 61 [place] Blake drain and secured this with 2-0 Vicryl.  I placed a TXA soaked sponge in the cavity and left this tear for 5 minutes.  Hemostasis was again confirmed.  This was then closed with 3-0 Vicryl and 4-0 Monocryl sutures.  She tolerated this well was extubated and transferred to recovery stable.

## 2024-04-16 NOTE — Anesthesia Procedure Notes (Signed)
 Procedure Name: LMA Insertion Date/Time: 04/16/2024 11:35 AM  Performed by: Arvell Edsel HERO, CRNAPre-anesthesia Checklist: Patient identified, Emergency Drugs available, Suction available, Patient being monitored and Timeout performed Patient Re-evaluated:Patient Re-evaluated prior to induction Oxygen Delivery Method: Circle system utilized Preoxygenation: Pre-oxygenation with 100% oxygen Induction Type: IV induction LMA: LMA inserted LMA Size: 4.0 Number of attempts: 1 Placement Confirmation: CO2 detector, breath sounds checked- equal and bilateral and positive ETCO2 Tube secured with: Tape

## 2024-04-17 ENCOUNTER — Encounter (HOSPITAL_COMMUNITY): Payer: Self-pay | Admitting: General Surgery

## 2024-04-17 ENCOUNTER — Inpatient Hospital Stay

## 2024-04-17 DIAGNOSIS — C50311 Malignant neoplasm of lower-inner quadrant of right female breast: Secondary | ICD-10-CM | POA: Diagnosis not present

## 2024-04-17 LAB — BASIC METABOLIC PANEL WITH GFR
Anion gap: 8 (ref 5–15)
BUN: 9 mg/dL (ref 8–23)
CO2: 24 mmol/L (ref 22–32)
Calcium: 9 mg/dL (ref 8.9–10.3)
Chloride: 107 mmol/L (ref 98–111)
Creatinine, Ser: 0.89 mg/dL (ref 0.44–1.00)
GFR, Estimated: >60 mL/min (ref >60)
Glucose, Bld: 117 mg/dL — ABNORMAL HIGH (ref 70–99)
Potassium: 3 mmol/L — ABNORMAL LOW (ref 3.5–5.1)
Sodium: 139 mmol/L (ref 135–145)

## 2024-04-17 MED ORDER — POTASSIUM CHLORIDE 20 MEQ PO PACK
40.0000 meq | PACK | Freq: Once | ORAL | Status: DC
  Filled 2024-04-17: qty 2

## 2024-04-17 MED ORDER — CHLORHEXIDINE GLUCONATE CLOTH 2 % EX PADS
6.0000 | MEDICATED_PAD | Freq: Every day | CUTANEOUS | Status: DC
  Administered 2024-04-18: 6 via TOPICAL

## 2024-04-17 MED ORDER — ACETAMINOPHEN 500 MG PO TABS
1000.0000 mg | ORAL_TABLET | Freq: Four times a day (QID) | ORAL | Status: DC
  Administered 2024-04-17 – 2024-04-18 (×5): 1000 mg via ORAL
  Filled 2024-04-17 (×5): qty 2

## 2024-04-17 MED ORDER — SODIUM CHLORIDE 0.9% FLUSH: 10.0000 mL | INTRAVENOUS | Status: DC | PRN

## 2024-04-17 MED ORDER — OXYCODONE HCL 5 MG PO TABS
5.0000 mg | ORAL_TABLET | ORAL | Status: DC | PRN
  Administered 2024-04-17 – 2024-04-18 (×4): 5 mg via ORAL
  Filled 2024-04-17 (×4): qty 1

## 2024-04-17 MED ORDER — SODIUM CHLORIDE 0.9% FLUSH
10.0000 mL | Freq: Two times a day (BID) | INTRAVENOUS | Status: DC
  Administered 2024-04-17 – 2024-04-18 (×2): 10 mL

## 2024-04-17 NOTE — Plan of Care (Signed)
  Problem: Activity: Goal: Risk for activity intolerance will decrease Outcome: Progressing   Problem: Nutrition: Goal: Adequate nutrition will be maintained Outcome: Progressing   Problem: Coping: Goal: Level of anxiety will decrease Outcome: Progressing   Problem: Pain Managment: Goal: General experience of comfort will improve and/or be controlled Outcome: Progressing   Problem: Safety: Goal: Ability to remain free from injury will improve Outcome: Progressing

## 2024-04-17 NOTE — TOC CM/SW Note (Addendum)
 Transition of Care [facility]) - Inpatient Brief Assessment   Patient Details  Name: JENNIFERANN STUCKERT MRN: [id number] Date of Birth: June 12, 1951  Transition of Care [facility]) CM/SW Contact:    Waddell Barnie Rama, RN Phone Number: 04/17/2024, 10:39 AM   Clinical Narrative: From home with spouse, has PCP and insurance on file, states has no HH services in place at this time or DME at home.  States family member will transport them home at [employer] and family is support system, states gets medications from [facility] [address] in [place].  Pta self ambulatory.  Patient gives this NCM permission to speak with spouse. Await PT eval.   Transition of Care Asessment: Insurance and Status: Insurance coverage has been reviewed Patient has primary care physician: Yes Home environment has been reviewed: home with spouse Prior level of function:: indep Prior/Current Home Services: No current home services Social Drivers of Health Review: SDOH reviewed no interventions necessary Readmission risk has been reviewed: Yes Transition of care needs: no transition of care needs at this time

## 2024-04-17 NOTE — Progress Notes (Signed)
 1 Day Post-Op   Subjective/Chief Complaint: Pain not controlled well, tired, tol oral intake last night   Objective: Vital signs in last 24 hours: Temp:  [97.5 F (36.4 C)-98.1 F (36.7 C)] 98 F (36.7 C) (07/16 0504) Pulse Rate:  [73-90] 79 (07/16 0032) Resp:  [14-25] 20 (07/16 0504) BP: (101-173)/(59-99) 123/59 (07/16 0504) SpO2:  [93 %-100 %] 95 % (07/16 0504) Weight:  [75.8 kg] 75.8 kg (07/15 1138) Last BM Date : 04/15/24  Intake/Output from previous day: 07/15 0701 - 07/16 0700 In: 620 [P.O.:120; I.V.:400; IV Piggyback:100] Out: 35 [Drains:15; Blood:20] Intake/Output this shift: No intake/output data recorded.  General nad Pulm effort normal No hematoma, drain minimal output serosang    Anti-infectives: Anti-infectives (From admission, onward)    Start     Dose/Rate Route Frequency Ordered Stop   04/16/24 1145  ceFAZolin  (ANCEF ) IVPB 2g/100 mL premix        2 g 200 mL/hr over 30 Minutes Intravenous On call to O.R. 04/16/24 1139 04/16/24 1410       Assessment/Plan: POD 1 right mastectomy/sn biopsy -regular diet as tolerated -dc iv fluids -pain control adjusted, I confirmed with her she can take tylenol  -will start pharm dvt prophlaxis tomorrow if stays longer -hopefully dc in am- stay impacted by deconditioning related to systemic therapy -pt consult today for recs -home meds  Donnice Bury 04/17/2024

## 2024-04-18 DIAGNOSIS — C50311 Malignant neoplasm of lower-inner quadrant of right female breast: Secondary | ICD-10-CM | POA: Diagnosis not present

## 2024-04-18 LAB — SURGICAL PATHOLOGY

## 2024-04-18 MED ORDER — METHOCARBAMOL 500 MG PO TABS
500.0000 mg | ORAL_TABLET | Freq: Three times a day (TID) | ORAL | Status: DC
  Administered 2024-04-18 (×2): 500 mg via ORAL
  Filled 2024-04-18 (×2): qty 1

## 2024-04-18 MED ORDER — METHOCARBAMOL 500 MG PO TABS: 500.0000 mg | ORAL_TABLET | Freq: Three times a day (TID) | ORAL | 0 refills | Status: DC

## 2024-04-18 MED ORDER — HEPARIN SOD (PORK) LOCK FLUSH 100 UNIT/ML IV SOLN
500.0000 [IU] | INTRAVENOUS | Status: AC | PRN
  Administered 2024-04-18: 500 [IU]

## 2024-04-18 MED ORDER — HEPARIN SODIUM (PORCINE) 5000 UNIT/ML IJ SOLN
5000.0000 [IU] | Freq: Three times a day (TID) | INTRAMUSCULAR | Status: DC
  Administered 2024-04-18: 5000 [IU] via SUBCUTANEOUS
  Filled 2024-04-18 (×2): qty 1

## 2024-04-18 MED ORDER — OXYCODONE HCL 5 MG PO TABS: 5.0000 mg | ORAL_TABLET | ORAL | 0 refills | Status: DC | PRN

## 2024-04-18 MED ORDER — POTASSIUM CHLORIDE 10 MEQ/100ML IV SOLN
10.0000 meq | INTRAVENOUS | Status: AC
  Administered 2024-04-18 (×6): 10 meq via INTRAVENOUS
  Filled 2024-04-18 (×6): qty 100

## 2024-04-18 NOTE — Evaluation (Signed)
 Physical Therapy Evaluation Patient Details Name: Rebecca Steele MRN: [id number] DOB: 08/14/1951 Today's Date: 04/18/2024  History of Present Illness  Pt is a 73 y/o F s/p left mastectomy after prior attempt at primary chemotherapy then lumpectomy. Now s/p R mastectomy/sn biopsy.  Clinical Impression  Received pt standing from commode and ambulating to sink in bathroom with husband present to supervise. Pt ambulated from commode<>sink to perform hand hygiene<>bed with supervision, then reaching out for husband to stabilize herself. Pt severely limited by bilateral burning foot pain, ultimately requesting to return to bed - transferred into supine mod I. Pt/husband live at ILF in handicap accessible apartment with no steps - husband able to provide 24/7 supervision at discharge. Pt's gait pattern is near baseline per pt/husband, however concerns regarding foot pain impacting mobility. Pt would benefit from PT services while in hospital but currently do not recommend follow up PT services after discharge, as pt is near baseline.       If plan is discharge home, recommend the following: A little help with walking and/or transfers;A little help with bathing/dressing/bathroom;Help with stairs or ramp for entrance;Assist for transportation;Assistance with cooking/housework   Can travel by private vehicle        Equipment Recommendations None recommended by PT  Recommendations for Other Services       Functional Status Assessment Patient has had a recent decline in their functional status and demonstrates the ability to make significant improvements in function in a reasonable and predictable amount of time.     Precautions / Restrictions Precautions Precautions: None Restrictions Weight Bearing Restrictions Per Provider Order: No      Mobility  Bed Mobility Overal bed mobility: Modified Independent             General bed mobility comments: sit<>supine with HOB slightly  elevated Patient Response: Cooperative  Transfers Overall transfer level: Modified independent Equipment used: None               General transfer comment: stood from regular height toilet using grab bar with increased time    Ambulation/Gait Ambulation/Gait assistance: Supervision, Contact guard assist Gait Distance (Feet): 15 Feet Assistive device: None, 1 person hand held assist Gait Pattern/deviations: Decreased stride length, Step-to pattern, Decreased step length - right, Decreased step length - left, Antalgic, Narrow base of support Gait velocity: decreased Gait velocity interpretation: 1.31 - 2.62 ft/sec, indicative of limited community ambulator   General Gait Details: ambulated from toilet<>sink<>bed initally without assist, then reaching out for external support from husband to stabilize due to pain in bilateral feet.  Stairs            Wheelchair Mobility     Tilt Bed Tilt Bed Patient Response: Cooperative  Modified Rankin (Stroke Patients Only)       Balance Overall balance assessment: Needs assistance Sitting-balance support: Feet supported Sitting balance-Leahy Scale: Normal     Standing balance support: No upper extremity supported, During functional activity Standing balance-Leahy Scale: Fair Standing balance comment: able to maintain static and dynamic standing balance with as little as supervision, but reaching out for husband to stabilize herself due to pain                             Pertinent Vitals/Pain Pain Assessment Pain Assessment: 0-10 Pain Score: 8  Pain Location: bottom of bilateral feet Pain Descriptors / Indicators: Burning, Discomfort, Crying, Grimacing, Throbbing Pain Intervention(s): Limited activity within patient's tolerance, Monitored  during session, Premedicated before session, Repositioned, Patient requesting pain meds-RN notified    Home Living Family/patient expects to be discharged to:: Private  residence Living Arrangements: Spouse/significant other Available Help at Discharge: Available 24 hours/day Type of Home: Apartment Home Access: Level entry       Home Layout: One level Home Equipment: Agricultural consultant (2 wheels);Rollator (4 wheels);Cane - single point Additional Comments: requesting shower seat to use at home. Lives in ILF with spouse.    Prior Function Prior Level of Function : Independent/Modified Independent;Driving             Mobility Comments: Independent without AD       Extremity/Trunk Assessment   Upper Extremity Assessment Upper Extremity Assessment: RUE deficits/detail;Right hand dominant RUE Deficits / Details: pt reports mild intermittent tingling/numbness along ular aspect of R hand radiating into 5th digit    Lower Extremity Assessment Lower Extremity Assessment: RLE deficits/detail;LLE deficits/detail RLE Deficits / Details: pt reports neuropathic pain along dorsal aspect of foot from chemo RLE: Unable to fully assess due to pain RLE Sensation: history of peripheral neuropathy LLE Deficits / Details: pt reports neuropathic pain along dorsal aspect of foot from chemo LLE: Unable to fully assess due to pain LLE Sensation: history of peripheral neuropathy    Cervical / Trunk Assessment Cervical / Trunk Assessment: Normal  Communication   Communication Communication: No apparent difficulties    Cognition Arousal: Alert Behavior During Therapy: WFL for tasks assessed/performed   PT - Cognitive impairments: No apparent impairments                       PT - Cognition Comments: pleasant and cooperative Following commands: Intact       Cueing Cueing Techniques: Verbal cues     General Comments      Exercises     Assessment/Plan    PT Assessment Patient needs continued PT services  PT Problem List Decreased activity tolerance;Decreased mobility;Impaired sensation;Pain       PT Treatment Interventions Gait  training;Functional mobility training;Balance training;Neuromuscular re-education;Patient/family education    PT Goals (Current goals can be found in the Care Plan section)  Acute Rehab PT Goals Patient Stated Goal: to decrease pain in feet PT Goal Formulation: With patient/family Time For Goal Achievement: 05/02/24 Potential to Achieve Goals: Good    Frequency Min 2X/week     Co-evaluation               AM-PAC PT 6 Clicks Mobility  Outcome Measure Help needed turning from your back to your side while in a flat bed without using bedrails?: None Help needed moving from lying on your back to sitting on the side of a flat bed without using bedrails?: None Help needed moving to and from a bed to a chair (including a wheelchair)?: A Little Help needed standing up from a chair using your arms (e.g., wheelchair or bedside chair)?: None Help needed to walk in hospital room?: A Little Help needed climbing 3-5 steps with a railing? : A Little 6 Click Score: 21    End of Session   Activity Tolerance: Patient limited by pain Patient left: in bed;with call bell/phone within reach;with family/visitor present Nurse Communication: Mobility status PT Visit Diagnosis: Pain;Unsteadiness on feet (R26.81) Pain - Right/Left:  (bilateral feet) Pain - part of body:  (bilateral feet)    Time: 1040-1102 PT Time Calculation (min) (ACUTE ONLY): 22 min   Charges:   PT Evaluation $PT Eval Low Complexity:  1 Low   PT General Charges $$ ACUTE PT VISIT: 1 Visit         Therisa Stains PT, DPT Therisa HERO Zaunegger 04/18/2024, 11:09 AM

## 2024-04-18 NOTE — Progress Notes (Signed)
 2 Days Post-Op   Subjective/Chief Complaint: Sore, not up much yesterday, neuropathy bothering her   Objective: Vital signs in last 24 hours: Temp:  [98.1 F (36.7 C)-98.8 F (37.1 C)] 98.6 F (37 C) (07/17 0413) Pulse Rate:  [74-82] 74 (07/17 0413) Resp:  [17-18] 17 (07/17 0413) BP: (123-137)/(65-74) 137/74 (07/17 0413) SpO2:  [94 %-97 %] 95 % (07/17 0413) Last BM Date : 04/15/24  Intake/Output from previous day: 07/16 0701 - 07/17 0700 In: 1445.8 [P.O.:960; IV Piggyback:485.8] Out: 25 [Drains:25] Intake/Output this shift: No intake/output data recorded.  General NAD Pulm effort normal Right mastectomy site without hematoma, drain as expected  Lab Results:  No results for input(s): WBC, HGB, HCT, PLT in the last 72 hours. BMET Recent Labs    04/17/24 1107  NA 139  K 3.0*  CL 107  CO2 24  GLUCOSE 117*  BUN 9  CREATININE 0.89  CALCIUM  9.0   PT/INR No results for input(s): LABPROT, INR in the last 72 hours. ABG No results for input(s): PHART, HCO3 in the last 72 hours.  Invalid input(s): PCO2, PO2  Studies/Results: No results found.  Anti-infectives: Anti-infectives (From admission, onward)    Start     Dose/Rate Route Frequency Ordered Stop   04/16/24 1145  ceFAZolin  (ANCEF ) IVPB 2g/100 mL premix        2 g 200 mL/hr over 30 Minutes Intravenous On call to O.R. 04/16/24 1139 04/16/24 1410       Assessment/Plan: POD 2 right mastectomy/sn biopsy -regular diet as tolerated -pain control  -will start pharm dvt prophlaxis today -pt consult not done yesterday- hopefully today -needs to be oob -home meds   Donnice Bury 04/18/2024

## 2024-04-18 NOTE — Discharge Instructions (Signed)
 [facility] [phone]  MASTECTOMY: POST OP INSTRUCTIONS Take 400 mg of ibuprofen every 8 hours or 650 mg tylenol  every 6 hours for next 72 hours then as needed. Use ice several times daily also.   A prescription for pain medication may be given to you upon discharge.  Take your pain medication as prescribed, if needed.  If narcotic pain medicine is not needed, then you may take acetaminophen  (Tylenol ), naprosyn (Alleve) or ibuprofen (Advil) as needed. Use the muscle relaxer as well for muscle pain/spasm.  Take your usually prescribed medications unless otherwise directed. If you need a refill on your pain medication, please contact your pharmacy.  They will contact our office to request authorization.  Prescriptions will not be filled after 5pm or on week-ends. You should follow a light diet the first 24 hours after surgery.  Resume your normal diet the day after surgery. Most patients will experience some swelling and bruising on the chest and underarm.  Ice packs will help.  Swelling and bruising can take several days to resolve. Wear the binder or surgical bra for 72 hours day and night. After that please wear during the day when up and around only. You do not have to wear at night unless you want to.  It is common to experience some constipation if taking pain medication after surgery.  Increasing fluid intake and taking a stool softener (such as Colace) will usually help or prevent this problem from occurring.  A mild laxative (Milk of Magnesia or Miralax ) should be taken according to package instructions if there are no bowel movements after 48 hours. There is glue and steristrips on your incision. They will come off in the next few weeks.  You may take a shower 48 hours after surgery.  Any sutures will be removed at an office visit DRAINS:  If you have drains in place, it is important to keep a list of the amount of drainage produced each day in your drains.  Before leaving the  hospital, you should be instructed on drain care.  Call our office if you have any questions about your drains. I will remove your drains when they put out less than 30 cc or ml for 2 consecutive days. We will communicate with you about drain output and have you come in when ready to be removed.  ACTIVITIES:  You may resume regular (light) daily activities beginning the next day--such as daily self-care, walking, climbing stairs--gradually increasing activities as tolerated. Refrain from any heavy lifting or straining until approved by your doctor. You may drive when you are no longer taking prescription pain medication, you can comfortably wear a seatbelt, and you can safely maneuver your car and apply brakes. RETURN TO WORK:  __________________________________________________________ Rosine should see your doctor in the office for a follow-up appointment approximately 2-3 after your surgery.  Your doctor's nurse will typically make your follow-up appointment when she calls you with your pathology report.  Expect your pathology report 3-4 business days after surgery. WHEN TO CALL YOUR DR Paz Fuentes: Fever over 101.0 Nausea and/or vomiting Extreme swelling or bruising Continued bleeding from incision. Increased pain, redness, or drainage from the incision. The clinic staff is available to answer your questions during regular business hours.  Please don't hesitate to call and ask to speak to one of the nurses for clinical concerns.  If you have a medical emergency, go to the nearest emergency room or call 911.  A surgeon from [facility] is always  on call at the hospital.  [address], [place], [place]  [address] ? [address] [address], [place], [place]   [address] [phone] ? [phone] ? FAX [phone] Web site: [email or web address]

## 2024-04-18 NOTE — Discharge Summary (Signed)
 Physician Discharge Summary  Patient ID: Rebecca Steele MRN: [id number] DOB/AGE: Mar 15, 1951 73 y.o.  Admit date: 04/16/2024 Discharge date: 04/18/2024  Admission Diagnoses: Breast cancer, right clinical stage I  Discharge Diagnoses:  Principal Problem:   S/P mastectomy, right   Discharged Condition: good  Hospital Course: 23 yof with history left breast cancer, deconditioning secondary to therapy developed new right breast cancer.  She underwent right mastectomy and sn biopsy.  She did well postop. She remained two nights due to deconditioning. Was evaluated by PT. By POD 2 she was tol diet, pain better controlled and ready for dc.   Consults PT  Significant Diagnostic Studies: none  Treatments: surgery: right mastectomy, sn biopsy  \  Disposition: Discharge disposition: 01-Home or Self Care        Allergies as of 04/18/2024       Reactions   Gadolinium Derivatives Anaphylaxis, Shortness Of Breath   Pt was given 17ml multihance  for MRI. After about 23min30 seconds she squeezed the ball saying she felt like her throat was closing up. Exam was DC'd and radiologist gave 75mg  benadryl  IV.    Other    Pt states she was given Gabapentin  and Fentanyl  for anesthesia during recent surgery and was unable to wake up for several hours.  Pt states she does not want to have sedation with those medications in the future.   Valium Anaphylaxis   Paxlovid [nirmatrelvir-ritonavir] Hives   Fentanyl  Other (See Comments)   Slept the whole time for few days   Iohexol  Itching    Desc: Pt. stated she had iv contrast around 25 yrs ago and had itching on her neck.  she took benadryl  and it went away.  The rad recommended premeds and be done tomorrow but the pt.did not want to do that so we did her w/o iv contrast today., Onset Date: [id number]   Macrodantin Nausea And Vomiting   Red Dye #40 (allura Red) Swelling   Facial swelling (cannot take pink or red tablets or capsules)   Rocephin  [ceftriaxone] Other (See Comments)   Joint aches   Tape    Blisters   Zithromax [azithromycin] Other (See Comments)   Weakness, cold/clammy, legs trembling        Medication List     TAKE these medications    butalbital -acetaminophen -caffeine  50-325-40 MG tablet Commonly known as: FIORICET Take 1 tablet by mouth 2 (two) times daily as needed for headache or migraine.   levothyroxine  75 MCG tablet Commonly known as: SYNTHROID  Take 75 mcg by mouth daily before breakfast.   loratadine  10 MG tablet Commonly known as: CLARITIN  Take 10 mg by mouth daily.   methocarbamol  500 MG tablet Commonly known as: ROBAXIN  Take 1 tablet (500 mg total) by mouth 3 (three) times daily.   metoCLOPramide  10 MG tablet Commonly known as: Reglan  Take 1 tablet (10 mg total) by mouth every 6 (six) hours as needed for nausea (nausea/headache).   montelukast  10 MG tablet Commonly known as: SINGULAIR  Take 10 mg by mouth at bedtime.   OLANZapine  2.5 MG tablet Commonly known as: ZyPREXA  Take 1 tablet (2.5 mg total) by mouth at bedtime.   ondansetron  8 MG disintegrating tablet Commonly known as: ZOFRAN -ODT Take 1 tablet (8 mg total) by mouth every 8 (eight) hours as needed for nausea or vomiting.   oxyCODONE  5 MG immediate release tablet Commonly known as: Oxy IR/ROXICODONE  Take 1 tablet (5 mg total) by mouth every 4 (four) hours as needed for severe pain (pain  score 7-10).   pantoprazole  40 MG tablet Commonly known as: PROTONIX  Take 1 tablet (40 mg total) by mouth daily.   sertraline  50 MG tablet Commonly known as: ZOLOFT  Take 50 mg by mouth daily.   sucralfate  1 GM/10ML suspension Commonly known as: CARAFATE  Take 10 mLs (1 g total) by mouth 4 (four) times daily -  with meals and at bedtime.   traZODone  100 MG tablet Commonly known as: DESYREL  Take 100 mg by mouth at bedtime.        Follow-up Information     Ebbie Cough, MD Follow up in 2 week(s).   Specialty: General  Surgery Contact information: [address] [place] [address] [phone]                 Signed: Cough Ebbie 04/18/2024, 4:01 PM

## 2024-04-19 NOTE — Progress Notes (Signed)
 Patient ID:  Rebecca Steele is a 73 y.o. (DOB [birth year]) female.    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 B12 deficiency    Assessment & Plan 1. Neuropathy. - Symptoms include significant pain in feet and ankles, not typical of orthopedic injuries. - Gabapentin  is not an option due to allergy; Cymbalta (duloxetine) discussed for neuropathy, chronic pain, and depression. - Advised to take Cymbalta in the morning; potential side effects including nausea discussed. - Prescription for Cymbalta 30 mg provided; instructed to inform the office if no improvement after a week. - Likely related to chemotherapy, but may also be in part related to B12 deficiency  2. Vitamin B12 deficiency. - Will resume B12 injections next week; regimen includes daily injections for 5 days, weekly injections for 4 weeks, then monthly injections. - Nurse visit scheduled in 1 week for B12 injection. - No contraindications noted from recent surgery.  3.  Breast cancer. - Now status post right mastectomy 04/16/2024. - Looked at the drain today as her husband wanted some recommendations on how to empty this - Appears to be healing well.  Follow-up. - Follow-up appointment scheduled in 5 weeks to evaluate the effectiveness of duloxetine and overall progress.    Follow up in about 5 weeks (around 05/24/2024) for nurse visit in 1 week for B12 injection.   Patient's Medications  New Prescriptions   DULOXETINE HCL (CYMBALTA) 30 MG CAPSULE    Take one capsule (30 mg dose) by mouth daily.  Previous Medications   BUTALBITAL -ACETAMINOPHEN -CAFFEINE  (ESGIC) 50-325-40 MG PER TABLET    Take one tablet by mouth 2 (two) times a day as needed.   LEVOTHYROXINE  SODIUM (SYNTHROID ,LEVOTHROID,LEVOXYL ) 75 MCG TABLET    TAKE 1 TABLET BY MOUTH EVERY DAY IN THE MORNING ON  EMPTY STOMACH   LORATADINE  (CLARITIN ) 10 MG TABLET    Take one tablet (10 mg dose) by mouth daily.   METOCLOPRAMIDE  HCL (REGLAN ) 10 MG TABLET    Take one tablet (10 mg dose) by mouth every 6 (six) hours as needed.   MONTELUKAST  (SINGULAIR ) 10 MG TABLET    Take one tablet (10 mg dose) by mouth daily.   ONDANSETRON  (ZOFRAN -ODT) 8 MG DISINTEGRATING TABLET    Take one tablet (8 mg dose) by mouth every 8 (eight) hours as needed.   OXYCODONE  HCL (ROXICODONE ) 5 MG IMMEDIATE RELEASE TABLET    Take one tablet (5 mg dose) by mouth every 4 (four) hours as needed for Pain.   PANTOPRAZOLE  SODIUM (PROTONIX ) 40 MG TABLET    Take one tablet (40 mg dose) by mouth daily.   TRAZODONE  (DESYREL ) 100 MG TABLET    TAKE ONE TABLET (100 MG DOSE) BY MOUTH DAILY.  Modified Medications   No medications on file  Discontinued Medications   METHOCARBAMOL  (ROBAXIN ) 500 MG TABLET    Take one tablet (500 mg dose) by mouth 3 (three) times a day.   SERTRALINE  (ZOLOFT ) 50 MG TABLET    TAKE ONE TABLET BY MOUTH DAILY.      Subjective   Patient ID:  Rebecca Steele is a 73 y.o. (DOB [birth year]) female    Patient presents with  . Nerve pain    Nerve pain in feet and ankles     HPI:   History of Present Illness The patient presents for evaluation of  neuropathy.  She underwent surgery on the 14th and is currently experiencing discomfort in her feet and ankles, which she attributes to neuropathy. She reports that her feet appear normal but do not feel so. She has been cautious while walking due to her condition. She has been using capsaicin cream for relief but found it ineffective/made neuropathy worse. She also mentions that she has not been consistently taking Zoloft .  She is willing to stop her Zoloft  in order to change to Cymbalta  She is interested in resuming her B12 injections.   She is now 3 days status post right mastectomy and reports that her pain is adequately controlled    Past Medical History, Past  Surgery History, Allergies, Social History, and Family History were reviewed and updated.    Review of Systems  Constitutional:  Negative for chills and fever.  Respiratory:  Negative for shortness of breath.   Cardiovascular:  Negative for chest pain and leg swelling.  Gastrointestinal:  Negative for constipation, diarrhea and nausea.  Genitourinary:  Negative for dysuria.  Musculoskeletal:  Positive for myalgias.  Neurological:  Positive for numbness. Negative for weakness.  Psychiatric/Behavioral:  Positive for dysphoric mood.      Objective  BP 120/80 (BP Location: Right Upper Arm, Patient Position: Sitting)   Pulse 95   Temp 97.6 F (36.4 C) (Temporal)   Resp 21   Ht 5' 5 (1.651 m)   Wt 165 lb 12.8 oz (75.2 kg)   SpO2 98%   Breastfeeding No   BMI 27.59 kg/m    Physical Exam No acute distress Visible skin is free of rash Head is atraumatic and normocephalic Respiratory: Clear to auscultation, no wheezing, rales or rhonchi Cardiovascular: Regular rate and rhythm, no murmurs, rubs, or gallops Extremities: Dorsalis pedis pulses in both feet are normal.  No edema. Chest wall bandaged with right lateral drain in place and approximately 20 mL serosanguineous fluid    Risks, benefits, and alternatives of the medications and treatment plan prescribed today were discussed, and patient expressed understanding. Plan follow-up as discussed or as needed if any worsening symptoms or change in condition.   A yearly preventative health exam was recommended and current age based recommendations were discussed.  Patient agreed to the use of DAX copilot today including audio transcription of today's note to facilitate transcription.

## 2024-04-21 ENCOUNTER — Ambulatory Visit: Payer: Self-pay | Admitting: Hematology and Oncology

## 2024-04-22 ENCOUNTER — Encounter: Payer: Self-pay | Admitting: *Deleted

## 2024-04-22 NOTE — Progress Notes (Signed)
 Patient into office to have drain checked. Patient reports, she cannot keep suction on drain. The drain has been leaking around insertion site. Patient reports, she has been using a thick kotex type pad, folding in half and changing twice a day. Pt thinks she has obtained 10 ml of drainage from JP drain in 24 hours for Saturday and again on Sunday.  Patient emptied JP drain last at 8:30 PM last night. There is 10 ml of dark red drain in bulb currently. JP bulb replaced with a new bulb, but this will not keep suction either. Drainage is leaking around insertion site.   Dr Ebbie notified of the above information. Dr Ebbie instructed to bring pt back to office tomorrow to see him. Dr Ebbie reports, he has tried to call patient regarding pathology results will discuss this tomorrow in office, but let patient know lymph nodes are negative. Pt made aware of this information.  Replaced drainage sponge around drain site, secured with paper tape. Patient instructed to keep suction on bulb during the day as much as possible, but do not worry about it at night per Dr Ebbie.   Patient instructed to arrive at 10:15 am in the morning at the office to see Dr Ebbie.

## 2024-04-24 ENCOUNTER — Inpatient Hospital Stay

## 2024-04-24 ENCOUNTER — Encounter: Payer: Self-pay | Admitting: *Deleted

## 2024-04-24 ENCOUNTER — Telehealth: Payer: Self-pay | Admitting: Hematology and Oncology

## 2024-04-24 NOTE — Telephone Encounter (Signed)
 Spoke with patient confirming upcoming appointment

## 2024-04-25 ENCOUNTER — Encounter: Payer: Self-pay | Admitting: Hematology and Oncology

## 2024-04-26 NOTE — Progress Notes (Signed)
   PROVIDER:  DONNICE CARLIN BURY, MD  MRN: [id number] DOB: Mar 22, 1951 DATE OF ENCOUNTER: 04/26/2024 Interval History:     73 year old female status post a right mastectomy and sentinel node. 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 This previously was ER/PR positive HER2 negative. She has 1 sentinel lymph node that is negative. Drain was having issues. Not much out now, not much leakage    Physical Examination:   Physical Exam   Incision healing, some swelling, drain with serous fluid   Assessment and Plan:     I took drain out today.  Discussed wound care. Will see in two weeks  MATTHEW CARLIN BURY, MD

## 2024-05-01 ENCOUNTER — Inpatient Hospital Stay

## 2024-05-08 ENCOUNTER — Telehealth: Payer: Self-pay

## 2024-05-08 ENCOUNTER — Inpatient Hospital Stay: Attending: Hematology and Oncology

## 2024-05-08 NOTE — Telephone Encounter (Signed)
 Pt verbally confirmed appt for 8/7

## 2024-05-09 ENCOUNTER — Encounter: Payer: Self-pay | Admitting: *Deleted

## 2024-05-09 ENCOUNTER — Inpatient Hospital Stay: Attending: Hematology and Oncology | Admitting: Hematology and Oncology

## 2024-05-09 VITALS — BP 132/76 | HR 91 | Temp 98.6°F | Resp 17 | Wt 159.2 lb

## 2024-05-09 DIAGNOSIS — Z171 Estrogen receptor negative status [ER-]: Secondary | ICD-10-CM

## 2024-05-09 DIAGNOSIS — Z1722 Progesterone receptor negative status: Secondary | ICD-10-CM | POA: Diagnosis not present

## 2024-05-09 DIAGNOSIS — Z803 Family history of malignant neoplasm of breast: Secondary | ICD-10-CM | POA: Insufficient documentation

## 2024-05-09 DIAGNOSIS — C50911 Malignant neoplasm of unspecified site of right female breast: Secondary | ICD-10-CM | POA: Insufficient documentation

## 2024-05-09 DIAGNOSIS — Z79890 Hormone replacement therapy (postmenopausal): Secondary | ICD-10-CM | POA: Insufficient documentation

## 2024-05-09 DIAGNOSIS — Z9011 Acquired absence of right breast and nipple: Secondary | ICD-10-CM | POA: Diagnosis not present

## 2024-05-09 DIAGNOSIS — C50212 Malignant neoplasm of upper-inner quadrant of left female breast: Secondary | ICD-10-CM | POA: Insufficient documentation

## 2024-05-09 DIAGNOSIS — Z79899 Other long term (current) drug therapy: Secondary | ICD-10-CM | POA: Diagnosis not present

## 2024-05-09 DIAGNOSIS — Z170 Estrogen receptor positive status [ER+]: Secondary | ICD-10-CM | POA: Diagnosis not present

## 2024-05-09 DIAGNOSIS — C773 Secondary and unspecified malignant neoplasm of axilla and upper limb lymph nodes: Secondary | ICD-10-CM | POA: Insufficient documentation

## 2024-05-09 DIAGNOSIS — Z1731 Human epidermal growth factor receptor 2 positive status: Secondary | ICD-10-CM | POA: Diagnosis not present

## 2024-05-09 DIAGNOSIS — Z7952 Long term (current) use of systemic steroids: Secondary | ICD-10-CM | POA: Diagnosis not present

## 2024-05-09 MED ORDER — PREDNISONE 5 MG PO TABS: 5.0000 mg | ORAL_TABLET | Freq: Every day | ORAL | 0 refills | Status: DC

## 2024-05-09 MED ORDER — LETROZOLE 2.5 MG PO TABS: 2.5000 mg | ORAL_TABLET | Freq: Every day | ORAL | 3 refills | Status: AC

## 2024-05-09 NOTE — Progress Notes (Signed)
 [facility] CONSULT NOTE  Patient Care Team: Abran Jon CROME, MD as PCP - General (Internal Medicine) Ebbie Cough, MD as Consulting Physician (General Surgery) Curlene Agent, MD as Consulting Physician (Obstetrics and Gynecology) Dyane Rush, MD (Inactive) as Consulting Physician (Gastroenterology) Loretha Ash, MD as Consulting Physician (Hematology and Oncology) Glean Stephane BROCKS, RN (Inactive) as Oncology Nurse Navigator Tyree Nanetta SAILOR, RN as Oncology Nurse Navigator  CHIEF COMPLAINTS/PURPOSE OF CONSULTATION:  Breast cancer follow up.  HISTORY OF PRESENTING ILLNESS:   Rebecca Steele 73 y.o. female is here because of recent diagnosis of left breast cancer  I reviewed her records extensively and collaborated the history with the patient.  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)  09/22/2023 Mammogram   Patient self palpated a breast mass in her left breast around November 2019 went for diagnostic mammogram, this showed there is a developing density on mammogram, 2 of the lower left axillary lymph nodes exhibit increased cortical thickness, ultrasound confirmed a 2.7 cm irregular hypoechoic mass in left breast at 12:00 5 cm from the nipple along with abnormal left axillary lymph   09/29/2023 Pathology Results   Left breast central superior needle core biopsy 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)   10/20/2023 - 10/23/2023 Chemotherapy   Patient is on Treatment Plan : BREAST  Docetaxel  + Carboplatin  + Trastuzumab  + Pertuzumab   (TCHP) q21d      11/24/2023 Cancer Staging   Staging form: Breast, AJCC 8th  Edition - Pathologic stage from 11/24/2023: ypT2, pN1, cM0, G3, ER-, PR-, HER2+ - Signed by Loretha Ash, MD on 11/24/2023 Stage prefix: Post-therapy Histologic grading system: 3 grade system   01/16/2024 - 03/19/2024 Chemotherapy   Patient is on Treatment Plan : BREAST ADO-Trastuzumab Emtansine  (Kadcyla ) q21d       She had left breast lumpectomy which showed Invasive poorly differentiated adenocarcinoma, grade 3, focal high grade DCIS, solid type without necrosis. Tumor measures 3.6 cms in greatest dimension.Invasive tumor in lateral and posterior margins. Pos lateral margin, 2/4 LN with macromets, one with ITC. She had re-excision on 12/12/2023.  History of Present Illness  Rebecca Steele is a 73 year old female with bilateral breast cancer who presents for follow-up after mastectomy and to address appetite issues.  She has experienced a significant decrease in appetite following her recent surgery, describing it as 'nonexistent' with a lack of desire for previously enjoyed foods, such as chocolate. Despite taking Cymbalta 30 mg daily for three weeks, there has been no improvement in her appetite. She is concerned about weight loss and is seeking a solution to stimulate her appetite.  She has a history of bilateral breast cancer and recently underwent a mastectomy on the right side. She has undergone multiple treatments, including surgery, radiation, and chemotherapy. She reports that her most recent surgery was approximately two and a half weeks ago. She reports some discomfort from dead skin that was trimmed recently, but overall feels she is healing.  She experiences neuropathy in her feet, described as 'like walking on coals,' which was alleviated by Cymbalta. She previously took Zoloft  but was switched to Cymbalta by her primary care doctor. She  also takes trazodone , and there is a noted interaction between Cymbalta and trazodone .  She has a history of using Zyprexa  for nausea, but she  no longer requires it for that purpose. She is currently not taking Protonix  as she does not feel the need for it.  Her social history includes working on [employer] for church and a desire to shop for clothes due to weight loss. She is gradually returning to her daily activities, although she finds shopping physically challenging at the moment.  Rest of the pertinent 10 point ROS reviewed and neg.  MEDICAL HISTORY:  Past Medical History:  Diagnosis Date   Breast mass, right    Cancer (HCC) 10/2023   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   Sleep apnea    HAS MILD OSA, NO CPAP NEEDED    SURGICAL HISTORY: Past Surgical History:  Procedure Laterality Date   APPENDECTOMY     AXILLARY SENTINEL NODE BIOPSY Right 04/16/2024   Procedure: BIOPSY, LYMPH NODE, SENTINEL, AXILLARY;  Surgeon: Ebbie Cough, MD;  Location: [facility];  Service: General;  Laterality: Right;  RIGHT MASTY RIGHT AXILLARY SENTINEL NODE BIOPSY   BREAST BIOPSY Right 09/16/2022   MM RT BREAST BX W LOC DEV 1ST LESION IMAGE BX SPEC STEREO GUIDE 09/16/2022 [facility]   BREAST BIOPSY  12/06/2022   MM RT RADIOACTIVE SEED LOC MAMMO GUIDE 12/06/2022 [facility]   BREAST BIOPSY Left 11/16/2023   US  LT RADIOACTIVE SEED LOC 11/16/2023 [facility]   BREAST BIOPSY  11/16/2023   MM LT RADIOACTIVE SEED EA ADD LESION LOC MAMMO GUIDE 11/16/2023 [facility]   BREAST BIOPSY Right 04/01/2024   US  RT BREAST BX W LOC DEV 1ST LESION IMG BX SPEC US  GUIDE 04/01/2024 [facility]    BREAST EXCISIONAL BIOPSY Right 2018   BREAST LUMPECTOMY WITH RADIOACTIVE SEED AND SENTINEL LYMPH NODE BIOPSY Left 11/20/2023   Procedure: LEFT BREAST SEED GUIDED LUMPECTOMY, LEFT AXILLARY SENTINEL NODE BIOPSY;  Surgeon: Ebbie Cough, MD;  Location: [facility];  Service: General;  Laterality: Left;  PEC BLOCK   CHOLECYSTECTOMY     CYSTOSCOPY W/ RETROGRADES     KYPHOPLASTY N/A 10/05/2021   Procedure: LUMBAR ONE KYPHOPLASTY;  Surgeon: Cheryle Debby LABOR, MD;  Location: [facility];  Service: Neurosurgery;  Laterality: N/A;   NECK SURGERY     PORTACATH PLACEMENT Right 10/12/2023   Procedure: INSERTION PORT-A-CATH WITH GUIDED ULTRASOUND;  Surgeon: Ebbie Cough, MD;  Location: [facility];  Service: General;  Laterality: Right;   RADIOACTIVE SEED GUIDED AXILLARY SENTINEL LYMPH NODE Left 11/20/2023   Procedure: LEFT AXILLARY NODE SEED GUIDED EXCISION;  Surgeon: Ebbie Cough, MD;  Location: [facility];  Service: General;  Laterality: Left;   RADIOACTIVE SEED GUIDED EXCISIONAL BREAST BIOPSY Right 12/12/2017   Procedure: RIGHT RADIOACTIVE SEED GUIDED EXCISIONAL BREAST BIOPSY ERAS PATHWAY;  Surgeon: Ebbie Cough, MD;  Location: [facility];  Service: General;  Laterality: Right;  LMA   RADIOACTIVE SEED GUIDED EXCISIONAL BREAST BIOPSY Right 12/07/2022   Procedure: RADIOACTIVE SEED GUIDED EXCISIONAL RIGHT BREAST BIOPSY;  Surgeon: Ebbie Cough, MD;  Location: [facility];  Service: General;  Laterality: Right;   SHOULDER SURGERY     SIMPLE MASTECTOMY WITH AXILLARY SENTINEL NODE BIOPSY Left 12/12/2023   Procedure: LEFT  MASTECTOMY;  Surgeon: Ebbie Cough, MD;  Location: [facility];  Service: General;  Laterality: Left;   TONSILLECTOMY     TOTAL HIP ARTHROPLASTY Left 06/04/2021   Procedure: TOTAL HIP ARTHROPLASTY ANTERIOR APPROACH;  Surgeon: Yvone Rush, MD;  Location: [facility];  Service:  Orthopedics;  Laterality: Left;   TOTAL MASTECTOMY Right 04/16/2024   Procedure: MASTECTOMY, SIMPLE;  Surgeon: Ebbie Cough, MD;  Location: [facility];  Service: General;  Laterality: Right;   TRIGGER FINGER RELEASE Right 05/18/2015   Procedure: RIGHT  LONG FINGER TRIGGER RELEASE ;  Surgeon: Alm Hummer, MD;  Location: [facility];  Service: Orthopedics;  Laterality: Right;   TUBAL LIGATION      SOCIAL HISTORY: Social History   Socioeconomic History   Marital status: Married    Spouse name: Not on file   Number of children: Not on file   Years of education: Not on file   Highest education level: Not on file  Occupational History   Not on file  Tobacco Use   Smoking status: Never   Smokeless tobacco: Never  Vaping Use   Vaping status: Never Used  Substance and Sexual Activity   Alcohol  use: No   Drug use: No   Sexual activity: Not Currently    Birth control/protection: Post-menopausal  Other Topics Concern   Not on file  Social History Narrative   Not on file   Social Drivers of Health   Financial Resource Strain: Low Risk  (01/01/2024)   Received from [facility]   Overall Financial Resource Strain (CARDIA)    Difficulty of Paying Living Expenses: Not hard at all  Food Insecurity: No Food Insecurity (04/16/2024)   Hunger Vital Sign    Worried About Running Out of Food in the Last Year: Never true    Ran Out of Food in the Last Year: Never true  Transportation Needs: No Transportation Needs (04/16/2024)   PRAPARE - Administrator, Civil Service (Medical): No    Lack of Transportation (Non-Medical): No  Physical Activity: Inactive (04/24/2023)   [facility] from [facility]   Exercise Vital Sign    On average, how many days per week do you engage in moderate to strenuous exercise (like a brisk walk)?: 0 days    On average, how many minutes do you engage in exercise at this level?: 10 min  Stress: No Stress Concern Present (04/24/2023)    Received from [facility] of Occupational [facility] - Occupational Stress Questionnaire    Feeling of Stress : Not at all  Social Connections: Socially Integrated (04/16/2024)   Social Connection and Isolation Panel    Frequency of Communication with Friends and Family: More than three times a week    Frequency of Social Gatherings with Friends and Family: More than three times a week    Attends Religious Services: More than 4 times per year    Active Member of [employer] or Organizations: Yes    Attends Banker Meetings: More than 4 times per year    Marital Status: Married  Catering manager Violence: Not At Risk (04/16/2024)  Humiliation, Afraid, Rape, and Kick questionnaire    Fear of Current or Ex-Partner: No    Emotionally Abused: No    Physically Abused: No    Sexually Abused: No    FAMILY HISTORY: Family History  Problem Relation Age of Onset   Breast cancer Maternal Aunt        57s    ALLERGIES:  is allergic to gadolinium derivatives, other, valium, paxlovid [nirmatrelvir-ritonavir], fentanyl , iohexol , macrodantin, red dye #40 (allura red), rocephin [ceftriaxone], tape, and zithromax [azithromycin].  MEDICATIONS:  Current Outpatient Medications  Medication Sig Dispense Refill   butalbital -acetaminophen -caffeine  (FIORICET) 50-325-40 MG tablet Take 1 tablet by mouth 2 (two) times daily as needed for headache or migraine.     levothyroxine  (SYNTHROID ) 75 MCG tablet Take 75 mcg by mouth daily before breakfast.     loratadine  (CLARITIN ) 10 MG tablet Take 10 mg by mouth daily.     montelukast  (SINGULAIR ) 10 MG tablet Take 10 mg by mouth at bedtime.     OLANZapine  (ZYPREXA ) 2.5 MG tablet Take 1 tablet (2.5 mg total) by mouth at bedtime. 30 tablet 3   methocarbamol  (ROBAXIN ) 500 MG tablet Take 1 tablet (500 mg total) by mouth 3 (three) times daily. (Patient not taking: Reported on 05/09/2024) 30 tablet 0   metoCLOPramide  (REGLAN ) 10 MG tablet Take 1 tablet  (10 mg total) by mouth every 6 (six) hours as needed for nausea (nausea/headache). (Patient not taking: Reported on 05/09/2024) 20 tablet 0   ondansetron  (ZOFRAN -ODT) 8 MG disintegrating tablet Take 1 tablet (8 mg total) by mouth every 8 (eight) hours as needed for nausea or vomiting. (Patient not taking: Reported on 05/09/2024) 30 tablet 1   oxyCODONE  (OXY IR/ROXICODONE ) 5 MG immediate release tablet Take 1 tablet (5 mg total) by mouth every 4 (four) hours as needed for severe pain (pain score 7-10). (Patient not taking: Reported on 05/09/2024) 16 tablet 0   pantoprazole  (PROTONIX ) 40 MG tablet Take 1 tablet (40 mg total) by mouth daily. 30 tablet 2   sertraline  (ZOLOFT ) 50 MG tablet Take 50 mg by mouth daily. (Patient not taking: Reported on 05/09/2024)     sucralfate  (CARAFATE ) 1 GM/10ML suspension Take 10 mLs (1 g total) by mouth 4 (four) times daily -  with meals and at bedtime. (Patient not taking: Reported on 05/09/2024) 414 mL 0   traZODone  (DESYREL ) 100 MG tablet Take 100 mg by mouth at bedtime.     No current facility-administered medications for this visit.    REVIEW OF SYSTEMS:   Constitutional: Denies fevers, chills or abnormal night sweats Eyes: Denies blurriness of vision, double vision or watery eyes Ears, nose, mouth, throat, and face: Denies mucositis or sore throat Respiratory: Denies cough, dyspnea or wheezes Cardiovascular: Denies palpitation, chest discomfort or lower extremity swelling Gastrointestinal:  Denies nausea, heartburn or change in bowel habits Skin: Denies abnormal skin rashes Lymphatics: Denies new lymphadenopathy or easy bruising Neurological:Denies numbness, tingling or new weaknesses Behavioral/Psych: Mood is stable, no new changes  Breast: As mentioned above All other systems were reviewed with the patient and are negative.  PHYSICAL EXAMINATION: ECOG PERFORMANCE STATUS: 0 - Asymptomatic  BP 132/76 (BP Location: Right Arm)   Pulse 91   Temp 98.6 F (37 C)  (Temporal)   Resp 17   Wt 159 lb 3.2 oz (72.2 kg)   SpO2 96%   BMI 26.49 kg/m    GENERAL appears well. No acute distress.   LABORATORY DATA:  I have reviewed the data  as listed Lab Results  Component Value Date   WBC 4.6 04/10/2024   HGB 14.8 04/10/2024   HCT 43.9 04/10/2024   MCV 88.5 04/10/2024   PLT 228 04/10/2024   Lab Results  Component Value Date   NA 139 04/17/2024   K 3.0 (L) 04/17/2024   CL 107 04/17/2024   CO2 24 04/17/2024    RADIOGRAPHIC STUDIES: I have personally reviewed the radiological reports and agreed with the findings in the report.  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    Bilateral breast cancer (ER/PR+ right, HER2+ left) status post bilateral mastectomy Bilateral breast cancer with ER/PR positive on the right and HER2 positive on the left. Status post bilateral mastectomy. Right-sided cancer is HER2 negative, strongly ER/PR positive. Left-sided cancer was HER2 positive. Current management for right-sided cancer involves anti-estrogen therapy. No further chemotherapy or radiation needed due to small tumor size and negative margins. - Prescribe letrozole  for anti-estrogen therapy. - Monitor bone density every two years. - Send prescription to [facility] on [address]. - No further anti her 2 therapy planned. She was not able to tolerate it.  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 Cymbalta prescribed by primary care physician has provided relief. - Continue Cymbalta for neuropathy management.  Poor appetite and weight loss Significant decrease in appetite and weight loss. Prednisone  chosen due to fewer interactions with current medications. - Prescribe prednisone  5 mg daily for one month for appetite  stimulation. - Evaluate the effectiveness of appetite stimulants in two weeks.  Depression Currently on Cymbalta for depression, started three weeks ago. Potential interaction with trazodone . - Continue Cymbalta for depression management. - Monitor for interactions between Cymbalta and trazodone .  Insomnia Taking trazodone  for insomnia. Potential interaction with Cymbalta. She will discuss this with her PCP   Total time spent: 30 min including history, exam, discussion of options, review of records, counseling and coordination of care  All questions were answered. The patient knows to call the clinic with any problems, questions or concerns.    Amber Stalls, MD 05/09/24

## 2024-05-13 ENCOUNTER — Ambulatory Visit
Admission: RE | Admit: 2024-05-13 | Discharge: 2024-05-13 | Disposition: A | Discharge status: Home / Self Care | Source: Ambulatory Visit | Attending: Adult Health | Admitting: Adult Health

## 2024-05-13 DIAGNOSIS — Z171 Estrogen receptor negative status [ER-]: Secondary | ICD-10-CM | POA: Insufficient documentation

## 2024-05-13 DIAGNOSIS — C50212 Malignant neoplasm of upper-inner quadrant of left female breast: Secondary | ICD-10-CM | POA: Insufficient documentation

## 2024-05-13 NOTE — Progress Notes (Signed)
  Radiation Oncology         [phone] ________________________________  Name: Rebecca Steele MRN: [id number]  Date of Service: 05/13/2024  DOB: 1950-12-30  Post Treatment Telephone Note  Diagnosis:  [id number], grade 3, HER2 amplified invasive ductal carcinoma of the left breast.    The patient was available for call today.   Symptoms of fatigue have improved since completing therapy.  Symptoms of skin changes have improved since completing therapy.  The patient was encouraged to avoid sun exposure in the area of prior treatment for up to one year following radiation with either sunscreen or by the style of clothing worn in the sun.  The patient has scheduled follow up with her medical oncologist Dr. Loretha 06/12/2024 for ongoing surveillance, and was encouraged to call if she develops concerns or questions regarding radiation.

## 2024-05-15 ENCOUNTER — Inpatient Hospital Stay

## 2024-06-12 ENCOUNTER — Inpatient Hospital Stay: Attending: Hematology and Oncology

## 2024-06-12 ENCOUNTER — Inpatient Hospital Stay

## 2024-06-12 ENCOUNTER — Inpatient Hospital Stay: Admitting: Hematology and Oncology

## 2024-07-15 ENCOUNTER — Ambulatory Visit: Attending: General Surgery

## 2024-07-15 VITALS — Wt 163.2 lb

## 2024-07-15 DIAGNOSIS — Z483 Aftercare following surgery for neoplasm: Secondary | ICD-10-CM | POA: Insufficient documentation

## 2024-07-15 NOTE — Therapy (Signed)
 OUTPATIENT PHYSICAL THERAPY SOZO SCREENING NOTE   Patient Name: Rebecca Steele MRN: [id number] DOB:Nov 21, 1950, 73 y.o., female Today's Date: 07/15/2024  PCP: Abran Jon CROME, MD REFERRING PROVIDER: Ebbie Cough, MD   PT End of Session - 07/15/24 [phone]     Visit Number 3   # unchanged due to screen only   PT Start Time 0949    PT Stop Time 0953    PT Time Calculation (min) 4 min    Activity Tolerance Patient tolerated treatment well    Behavior During Therapy [facility] for tasks assessed/performed          Past Medical History:  Diagnosis Date   Breast mass, right    Cancer (HCC) 10/2023   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   Sleep apnea    HAS MILD OSA, NO CPAP NEEDED   Past Surgical History:  Procedure Laterality Date   APPENDECTOMY     AXILLARY SENTINEL NODE BIOPSY Right 04/16/2024   Procedure: BIOPSY, LYMPH NODE, SENTINEL, AXILLARY;  Surgeon: Ebbie Cough, MD;  Location: [facility];  Service: General;  Laterality: Right;  RIGHT MASTY RIGHT AXILLARY SENTINEL NODE BIOPSY   BREAST BIOPSY Right 09/16/2022   MM RT BREAST BX W LOC DEV 1ST LESION IMAGE BX SPEC STEREO GUIDE 09/16/2022 [facility]   BREAST BIOPSY  12/06/2022   MM RT RADIOACTIVE SEED LOC MAMMO GUIDE 12/06/2022 [facility]   BREAST BIOPSY Left 11/16/2023   US  LT RADIOACTIVE SEED LOC 11/16/2023 [facility]   BREAST BIOPSY  11/16/2023   MM LT RADIOACTIVE SEED EA ADD LESION LOC MAMMO GUIDE 11/16/2023 [facility]   BREAST BIOPSY Right 04/01/2024   US  RT  BREAST BX W LOC DEV 1ST LESION IMG BX SPEC US  GUIDE 04/01/2024 [facility]   BREAST EXCISIONAL BIOPSY Right 2018   BREAST LUMPECTOMY WITH RADIOACTIVE SEED AND SENTINEL LYMPH NODE BIOPSY Left 11/20/2023   Procedure: LEFT BREAST SEED GUIDED LUMPECTOMY, LEFT AXILLARY SENTINEL NODE BIOPSY;  Surgeon: Ebbie Cough, MD;  Location: [facility];  Service: General;  Laterality: Left;  PEC BLOCK   CHOLECYSTECTOMY     CYSTOSCOPY W/ RETROGRADES     KYPHOPLASTY N/A 10/05/2021   Procedure: LUMBAR ONE KYPHOPLASTY;  Surgeon: Cheryle Debby LABOR, MD;  Location: [facility];  Service: Neurosurgery;  Laterality: N/A;   NECK SURGERY     PORTACATH PLACEMENT Right 10/12/2023   Procedure: INSERTION PORT-A-CATH WITH GUIDED ULTRASOUND;  Surgeon: Ebbie Cough, MD;  Location: [facility];  Service: General;  Laterality: Right;   RADIOACTIVE SEED GUIDED AXILLARY SENTINEL LYMPH NODE Left 11/20/2023   Procedure: LEFT AXILLARY NODE SEED GUIDED EXCISION;  Surgeon: Ebbie Cough, MD;  Location: [facility];  Service: General;  Laterality: Left;   RADIOACTIVE SEED GUIDED EXCISIONAL BREAST BIOPSY Right 12/12/2017   Procedure: RIGHT RADIOACTIVE SEED GUIDED EXCISIONAL BREAST BIOPSY ERAS PATHWAY;  Surgeon: Ebbie Cough, MD;  Location: [facility];  Service: General;  Laterality:  Right;  LMA   RADIOACTIVE SEED GUIDED EXCISIONAL BREAST BIOPSY Right 12/07/2022   Procedure: RADIOACTIVE SEED GUIDED EXCISIONAL RIGHT BREAST BIOPSY;  Surgeon: Ebbie Cough, MD;  Location: [facility];  Service: General;  Laterality: Right;   SHOULDER SURGERY     SIMPLE MASTECTOMY WITH AXILLARY SENTINEL NODE BIOPSY Left 12/12/2023   Procedure: LEFT  MASTECTOMY;  Surgeon: Ebbie Cough, MD;  Location: [facility];  Service: General;  Laterality: Left;   TONSILLECTOMY     TOTAL HIP ARTHROPLASTY Left 06/04/2021   Procedure: TOTAL HIP ARTHROPLASTY  ANTERIOR APPROACH;  Surgeon: Yvone Rush, MD;  Location: [facility];  Service: Orthopedics;  Laterality: Left;   TOTAL MASTECTOMY Right 04/16/2024   Procedure: MASTECTOMY, SIMPLE;  Surgeon: Ebbie Cough, MD;  Location: [facility];  Service: General;  Laterality: Right;   TRIGGER FINGER RELEASE Right 05/18/2015   Procedure: RIGHT  LONG FINGER TRIGGER RELEASE ;  Surgeon: Alm Hummer, MD;  Location: [facility];  Service: Orthopedics;  Laterality: Right;   TUBAL LIGATION     Patient Active Problem List   Diagnosis Date Noted   Malignant neoplasm of right breast (HCC) 05/09/2024   S/P mastectomy, right 04/16/2024   Intractable nausea and vomiting 04/01/2024   Dehydration 04/01/2024   Epigastric pain 04/01/2024   High anion gap metabolic acidosis 04/01/2024   Hypercalcemia 04/01/2024   Hyperbilirubinemia 04/01/2024   Hypophosphatemia 04/01/2024   Chronic diastolic CHF (congestive heart failure) (HCC) 04/01/2024   Acquired hypothyroidism 04/01/2024   Allergic rhinitis 04/01/2024   Depression 04/01/2024   Nausea & vomiting 03/10/2024   Chemotherapy induced nausea and vomiting 03/10/2024   S/P left mastectomy 12/12/2023   Breast cancer, left breast (HCC) 11/20/2023   Port-A-Cath in place 10/18/2023   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 It measures at least  5mm and is located in the upper inner quadrant. It is ER/PR neg, HER2+ with 1 metastatic lymph node. Pt had  neoadjuvant chemotherapy TCHP regimen 1/17-1/20/2025. She had significant side effects to chemotheray and did not wish to proceed. She underwent left lumpectomy with SLNB on 11/20/2023 with macrometastases of 2 of 4 LN's. She underwent a left Mastectomy on 12/12/2023. Saw MD 01/03/2024 with left arm pit redness and possible drainage.Other hx: Cervical Fusion . She has radiation simulation on April 15 and starts chemo on the 15th for a year. Bil mastectomy 04/16/2024 due to new cancer in RIGHT breast, no LN's removed.  PRECAUTIONS: left UE Lymphedema risk, None  SUBJECTIVE: Pt returns for her 3 month L-Dex screen. I was hospitalized twice from the chemo and the second time they found another cancer in my other breast! So I went ahead and had a double mastectomy (no LN's removed from Rt) and I am doing great now.   PAIN:  Are you having pain? No  SOZO SCREENING: Patient was assessed today using the SOZO machine to  determine the lymphedema index score. This was compared to her baseline score. It was determined that she is within the recommended range when compared to her baseline and no further action is needed at this time. She will continue SOZO screenings. These are done every 3 months for 2 years post operatively followed by every 6 months for 2 years, and then annually.   L-DEX FLOWSHEETS - 07/15/24 0900       L-DEX LYMPHEDEMA SCREENING   Measurement Type Unilateral    L-DEX MEASUREMENT EXTREMITY Upper Extremity    POSITION  Standing    DOMINANT SIDE Right    At Risk Side Left    BASELINE SCORE (UNILATERAL) 3.8    L-DEX SCORE (UNILATERAL) -0.2    VALUE CHANGE (UNILAT) -4            Aden Berwyn Caldron, PTA 07/15/2024, 9:53 AM

## 2024-07-18 ENCOUNTER — Encounter (HOSPITAL_BASED_OUTPATIENT_CLINIC_OR_DEPARTMENT_OTHER): Payer: Self-pay | Admitting: General Surgery

## 2024-07-22 ENCOUNTER — Other Ambulatory Visit: Payer: Self-pay | Admitting: General Surgery

## 2024-07-23 MED ORDER — CHLORHEXIDINE GLUCONATE CLOTH 2 % EX PADS: 6.0000 | MEDICATED_PAD | Freq: Once | CUTANEOUS | Status: DC

## 2024-07-23 MED ORDER — ENSURE PRE-SURGERY PO LIQD: 296.0000 mL | Freq: Once | ORAL | Status: DC

## 2024-07-23 NOTE — Progress Notes (Signed)

## 2024-07-25 ENCOUNTER — Ambulatory Visit (HOSPITAL_BASED_OUTPATIENT_CLINIC_OR_DEPARTMENT_OTHER)
Admission: RE | Admit: 2024-07-25 | Discharge: 2024-07-25 | Disposition: A | Discharge status: Home / Self Care | Attending: General Surgery | Admitting: General Surgery

## 2024-07-25 ENCOUNTER — Encounter (HOSPITAL_BASED_OUTPATIENT_CLINIC_OR_DEPARTMENT_OTHER)
Admission: RE | Disposition: A | Discharge status: Home / Self Care | Payer: Self-pay | Source: Home / Self Care | Attending: General Surgery

## 2024-07-25 ENCOUNTER — Ambulatory Visit (HOSPITAL_BASED_OUTPATIENT_CLINIC_OR_DEPARTMENT_OTHER): Admitting: Certified Registered"

## 2024-07-25 ENCOUNTER — Encounter (HOSPITAL_BASED_OUTPATIENT_CLINIC_OR_DEPARTMENT_OTHER): Payer: Self-pay | Admitting: General Surgery

## 2024-07-25 ENCOUNTER — Other Ambulatory Visit: Payer: Self-pay

## 2024-07-25 DIAGNOSIS — Y838 Other surgical procedures as the cause of abnormal reaction of the patient, or of later complication, without mention of misadventure at the time of the procedure: Secondary | ICD-10-CM | POA: Insufficient documentation

## 2024-07-25 DIAGNOSIS — Z853 Personal history of malignant neoplasm of breast: Secondary | ICD-10-CM | POA: Insufficient documentation

## 2024-07-25 DIAGNOSIS — E039 Hypothyroidism, unspecified: Secondary | ICD-10-CM

## 2024-07-25 DIAGNOSIS — Z9013 Acquired absence of bilateral breasts and nipples: Secondary | ICD-10-CM

## 2024-07-25 DIAGNOSIS — Z9011 Acquired absence of right breast and nipple: Secondary | ICD-10-CM | POA: Diagnosis not present

## 2024-07-25 DIAGNOSIS — I5032 Chronic diastolic (congestive) heart failure: Secondary | ICD-10-CM

## 2024-07-25 DIAGNOSIS — T8189XA Other complications of procedures, not elsewhere classified, initial encounter: Secondary | ICD-10-CM | POA: Insufficient documentation

## 2024-07-25 DIAGNOSIS — T8131XA Disruption of external operation (surgical) wound, not elsewhere classified, initial encounter: Secondary | ICD-10-CM | POA: Diagnosis present

## 2024-07-25 DIAGNOSIS — N6091 Unspecified benign mammary dysplasia of right breast: Secondary | ICD-10-CM

## 2024-07-25 HISTORY — PX: MASS EXCISION: SHX2000

## 2024-07-25 SURGERY — EXCISION, MASS, CHEST WALL
Anesthesia: General | Site: Chest | Laterality: Right

## 2024-07-25 MED ORDER — PROPOFOL 500 MG/50ML IV EMUL
INTRAVENOUS | Status: AC
  Filled 2024-07-25: qty 50

## 2024-07-25 MED ORDER — CEFAZOLIN SODIUM-DEXTROSE 2-4 GM/100ML-% IV SOLN
INTRAVENOUS | Status: AC
  Filled 2024-07-25: qty 100

## 2024-07-25 MED ORDER — TRAMADOL HCL 50 MG PO TABS
ORAL_TABLET | ORAL | Status: AC
  Filled 2024-07-25: qty 1

## 2024-07-25 MED ORDER — FENTANYL CITRATE (PF) 100 MCG/2ML IJ SOLN
INTRAMUSCULAR | Status: AC
  Filled 2024-07-25: qty 2

## 2024-07-25 MED ORDER — TRANEXAMIC ACID 1000 MG/10ML IV SOLN
INTRAVENOUS | Status: AC
  Filled 2024-07-25: qty 30

## 2024-07-25 MED ORDER — PROPOFOL 10 MG/ML IV BOLUS
INTRAVENOUS | Status: DC | PRN
  Administered 2024-07-25: 200 mg via INTRAVENOUS
  Administered 2024-07-25: 200 ug/kg/min via INTRAVENOUS

## 2024-07-25 MED ORDER — BUPIVACAINE HCL (PF) 0.25 % IJ SOLN
INTRAMUSCULAR | Status: AC
  Filled 2024-07-25: qty 30

## 2024-07-25 MED ORDER — FENTANYL CITRATE (PF) 100 MCG/2ML IJ SOLN
INTRAMUSCULAR | Status: DC | PRN
  Administered 2024-07-25: 25 ug via INTRAVENOUS
  Administered 2024-07-25: 50 ug via INTRAVENOUS
  Administered 2024-07-25: 25 ug via INTRAVENOUS

## 2024-07-25 MED ORDER — TRAMADOL HCL 50 MG PO TABS: 50.0000 mg | ORAL_TABLET | Freq: Four times a day (QID) | ORAL | 0 refills | Status: AC | PRN

## 2024-07-25 MED ORDER — TRAMADOL HCL 50 MG PO TABS
50.0000 mg | ORAL_TABLET | Freq: Once | ORAL | Status: AC
Start: 2024-07-25 — End: 2024-07-25
  Administered 2024-07-25: 50 mg via ORAL

## 2024-07-25 MED ORDER — TRANEXAMIC ACID 1000 MG/10ML IV SOLN
Status: DC | PRN
  Administered 2024-07-25: 3000 mg via TOPICAL

## 2024-07-25 MED ORDER — DEXAMETHASONE SOD PHOSPHATE PF 10 MG/ML IJ SOLN
INTRAMUSCULAR | Status: DC | PRN
Start: 2024-07-25 — End: 2024-07-25
  Administered 2024-07-25: 5 mg via INTRAVENOUS

## 2024-07-25 MED ORDER — LIDOCAINE 2% (20 MG/ML) 5 ML SYRINGE
INTRAMUSCULAR | Status: AC
  Filled 2024-07-25: qty 5

## 2024-07-25 MED ORDER — ONDANSETRON HCL 4 MG/2ML IJ SOLN
INTRAMUSCULAR | Status: AC
  Filled 2024-07-25: qty 2

## 2024-07-25 MED ORDER — HYDROMORPHONE HCL 1 MG/ML IJ SOLN: 0.2500 mg | INTRAMUSCULAR | Status: DC | PRN

## 2024-07-25 MED ORDER — BUPIVACAINE HCL (PF) 0.25 % IJ SOLN
INTRAMUSCULAR | Status: DC | PRN
  Administered 2024-07-25: 10 mL

## 2024-07-25 MED ORDER — AMISULPRIDE (ANTIEMETIC) 5 MG/2ML IV SOLN: 10.0000 mg | Freq: Once | INTRAVENOUS | Status: DC | PRN

## 2024-07-25 MED ORDER — ONDANSETRON HCL 4 MG/2ML IJ SOLN
INTRAMUSCULAR | Status: DC | PRN
  Administered 2024-07-25: 4 mg via INTRAVENOUS

## 2024-07-25 MED ORDER — ACETAMINOPHEN 500 MG PO TABS
ORAL_TABLET | ORAL | Status: AC
  Filled 2024-07-25: qty 2

## 2024-07-25 MED ORDER — CEFAZOLIN SODIUM-DEXTROSE 2-4 GM/100ML-% IV SOLN
2.0000 g | INTRAVENOUS | Status: AC
  Administered 2024-07-25: 2 g via INTRAVENOUS

## 2024-07-25 MED ORDER — LACTATED RINGERS IV SOLN: INTRAVENOUS | Status: DC

## 2024-07-25 MED ORDER — LIDOCAINE 2% (20 MG/ML) 5 ML SYRINGE
INTRAMUSCULAR | Status: DC | PRN
  Administered 2024-07-25: 60 mg via INTRAVENOUS

## 2024-07-25 MED ORDER — ACETAMINOPHEN 500 MG PO TABS
1000.0000 mg | ORAL_TABLET | ORAL | Status: AC
  Administered 2024-07-25: 1000 mg via ORAL

## 2024-07-25 SURGICAL SUPPLY — 45 items
BINDER BREAST XLRG (GAUZE/BANDAGES/DRESSINGS) IMPLANT
BLADE SURG 15 STRL LF DISP TIS (BLADE) ×1 IMPLANT
CANISTER SUCT 1200ML W/VALVE (MISCELLANEOUS) IMPLANT
CHLORAPREP W/TINT 26 (MISCELLANEOUS) ×1 IMPLANT
CLSR STERI-STRIP ANTIMIC 1/2X4 (GAUZE/BANDAGES/DRESSINGS) ×1 IMPLANT
COVER BACK TABLE 60X90IN (DRAPES) ×1 IMPLANT
COVER MAYO STAND STRL (DRAPES) ×1 IMPLANT
DERMABOND ADVANCED .7 DNX12 (GAUZE/BANDAGES/DRESSINGS) ×1 IMPLANT
DRAPE LAPAROSCOPIC ABDOMINAL (DRAPES) IMPLANT
DRAPE UTILITY XL STRL (DRAPES) ×1 IMPLANT
DRSG TEGADERM 4X4.75 (GAUZE/BANDAGES/DRESSINGS) IMPLANT
ELECT COATED BLADE 2.86 ST (ELECTRODE) IMPLANT
ELECTRODE REM PT RTRN 9FT ADLT (ELECTROSURGICAL) ×1 IMPLANT
GAUZE PACKING IODOFORM 1/4X15 (PACKING) IMPLANT
GAUZE SPONGE 4X4 12PLY STRL LF (GAUZE/BANDAGES/DRESSINGS) IMPLANT
GLOVE BIO SURGEON STRL SZ7 (GLOVE) ×1 IMPLANT
GLOVE BIOGEL PI IND STRL 7.5 (GLOVE) ×1 IMPLANT
GOWN STRL REUS W/ TWL LRG LVL3 (GOWN DISPOSABLE) ×3 IMPLANT
KIT MARKER MARGIN INK (KITS) IMPLANT
NDL HYPO 25X1 1.5 SAFETY (NEEDLE) ×1 IMPLANT
NEEDLE HYPO 25X1 1.5 SAFETY (NEEDLE) ×1 IMPLANT
NS IRRIG 1000ML POUR BTL (IV SOLUTION) IMPLANT
PACK BASIN DAY SURGERY FS (CUSTOM PROCEDURE TRAY) ×1 IMPLANT
PENCIL SMOKE EVACUATOR (MISCELLANEOUS) ×1 IMPLANT
POWDER SURGICEL 3.0 GRAM (HEMOSTASIS) IMPLANT
SLEEVE SCD COMPRESS KNEE MED (STOCKING) ×1 IMPLANT
SPIKE FLUID TRANSFER (MISCELLANEOUS) IMPLANT
SPONGE T-LAP 18X18 ~~LOC~~+RFID (SPONGE) IMPLANT
SPONGE T-LAP 4X18 ~~LOC~~+RFID (SPONGE) ×1 IMPLANT
SUT ETHILON 2 0 FS 18 (SUTURE) IMPLANT
SUT ETHILON 3 0 PS 1 (SUTURE) IMPLANT
SUT MNCRL AB 4-0 PS2 18 (SUTURE) ×1 IMPLANT
SUT SILK 2 0 SH (SUTURE) IMPLANT
SUT VIC AB 2-0 SH 27XBRD (SUTURE) IMPLANT
SUT VIC AB 3-0 SH 27X BRD (SUTURE) IMPLANT
SUT VIC AB 4-0 PS2 18 (SUTURE) IMPLANT
SUT VICRYL 3-0 CR8 SH (SUTURE) IMPLANT
SWAB COLLECTION DEVICE MRSA (MISCELLANEOUS) IMPLANT
SWAB CULTURE ESWAB REG 1ML (MISCELLANEOUS) IMPLANT
SYR CONTROL 10ML LL (SYRINGE) ×1 IMPLANT
TOWEL GREEN STERILE FF (TOWEL DISPOSABLE) ×1 IMPLANT
TRAY DSU PREP LF (CUSTOM PROCEDURE TRAY) IMPLANT
TUBE CONNECTING 20X1/4 (TUBING) IMPLANT
UNDERPAD 30X36 HEAVY ABSORB (UNDERPADS AND DIAPERS) IMPLANT
YANKAUER SUCT BULB TIP NO VENT (SUCTIONS) IMPLANT

## 2024-07-25 NOTE — Anesthesia Postprocedure Evaluation (Signed)
 Anesthesia Post Note  Patient: Rebecca Steele  Procedure(s) Performed: EXCISION, MASS, CHEST WALL (Right: Chest)     Patient location during evaluation: PACU Anesthesia Type: General Level of consciousness: awake and alert Pain management: pain level controlled Vital Signs Assessment: post-procedure vital signs reviewed and stable Respiratory status: spontaneous breathing, nonlabored ventilation and respiratory function stable Cardiovascular status: blood pressure returned to baseline and stable Postop Assessment: no apparent nausea or vomiting Anesthetic complications: no   No notable events documented.  Last Vitals:  Vitals:   07/25/24 1130 07/25/24 1145  BP: (!) 165/85 (!) 161/79  Pulse: 74 77  Resp: 13 11  Temp:    SpO2: 98% 98%    Last Pain:  Vitals:   07/25/24 1130  TempSrc:   PainSc: Asleep                 Butler Levander Pinal

## 2024-07-25 NOTE — Interval H&P Note (Signed)
 History and Physical Interval Note:  07/25/2024 9:12 AM  Rebecca Steele  has presented today for surgery, with the diagnosis of RIGHT MASTECTOMY WOUND.  The various methods of treatment have been discussed with the patient and family. After consideration of risks, benefits and other options for treatment, the patient has consented to  Procedure(s) with comments: EXCISION, MASS, CHEST WALL (Right) - EXCISION RIGHT MASTECTOMY WOUND as a surgical intervention.  The patient's history has been reviewed, patient examined, no change in status, stable for surgery.  I have reviewed the patient's chart and labs.  Questions were answered to the patient's satisfaction.     Donnice Bury

## 2024-07-25 NOTE — Anesthesia Preprocedure Evaluation (Addendum)
 Anesthesia Evaluation  Patient identified by MRN, date of birth, ID band Patient awake    Reviewed: Allergy & Precautions, NPO status , Patient's Chart, lab work & pertinent test results  History of Anesthesia Complications (+) PROLONGED EMERGENCE and history of anesthetic complications (stiff neck, hard to wake up after given gabapentin  and fentanyl )  Airway Mallampati: III  TM Distance: >3 FB Neck ROM: Full   Comment: Previous grade II view with MAC 3, easy mask Dental  (+) Dental Advisory Given   Pulmonary neg shortness of breath, sleep apnea (does not use CPAP) , neg COPD, neg recent URI   Pulmonary exam normal breath sounds clear to auscultation       Cardiovascular (-) hypertension(-) angina +CHF  (-) Past MI, (-) Cardiac Stents and (-) CABG + Valvular Problems/Murmurs MVP  Rhythm:Regular Rate:Normal  TTE 01/12/2024: IMPRESSIONS    1. Left ventricular ejection fraction, by estimation, is 55 to 60%. Left  ventricular ejection fraction by 3D volume is 58 %. The left ventricle has  normal function. The left ventricle has no regional wall motion  abnormalities. Left ventricular diastolic   parameters are consistent with Grade I diastolic dysfunction (impaired  relaxation). The average left ventricular global longitudinal strain is  -15.4 %. The global longitudinal strain is abnormal but tracking of  lateral wall appeared suboptimal.   2. Right ventricular systolic function is normal. The right ventricular  size is normal. There is normal pulmonary artery systolic pressure. The  estimated right ventricular systolic pressure is 32.2 mmHg.   3. The mitral valve is normal in structure. Trivial mitral valve  regurgitation. No evidence of mitral stenosis.   4. The aortic valve is tricuspid. There is mild calcification of the  aortic valve. Aortic valve regurgitation is not visualized. No aortic  stenosis is present.   5. The  inferior vena cava is dilated in size with >50% respiratory  variability, suggesting right atrial pressure of 8 mmHg.     Neuro/Psych  Headaches, neg Seizures PSYCHIATRIC DISORDERS  Depression     Neuromuscular disease (neuropathy)    GI/Hepatic Neg liver ROS,GERD  Medicated,,  Endo/Other  neg diabetesHypothyroidism    Renal/GU negative Renal ROS     Musculoskeletal  (+) Arthritis ,    Abdominal   Peds  Hematology negative hematology ROS (+)   Anesthesia Other Findings Did okay last time with fentanyl  and no gabapentin .  She has not had any swelling in her left arm since her surgery. She has neuropathy in both feet.  Reproductive/Obstetrics Right breast cancer                              Anesthesia Physical Anesthesia Plan  ASA: 3  Anesthesia Plan: General   Post-op Pain Management: Tylenol  PO (pre-op)*   Induction: Intravenous  PONV Risk Score and Plan: 3 and Ondansetron , Dexamethasone , Propofol  infusion, TIVA and Treatment may vary due to age or medical condition  Airway Management Planned: LMA  Additional Equipment:   Intra-op Plan:   Post-operative Plan: Extubation in OR  Informed Consent: I have reviewed the patients History and Physical, chart, labs and discussed the procedure including the risks, benefits and alternatives for the proposed anesthesia with the patient or authorized representative who has indicated his/her understanding and acceptance.     Dental advisory given  Plan Discussed with: CRNA and Anesthesiologist  Anesthesia Plan Comments: (Risks of general anesthesia discussed including, but not limited to,  sore throat, hoarse voice, chipped/damaged teeth, injury to vocal cords, nausea and vomiting, allergic reactions, lung infection, heart attack, stroke, and death. All questions answered. )         Anesthesia Quick Evaluation

## 2024-07-25 NOTE — Anesthesia Procedure Notes (Signed)
 Procedure Name: LMA Insertion Date/Time: 07/25/2024 9:33 AM  Performed by: Delayne Olam BIRCH, CRNAPre-anesthesia Checklist: Patient identified, Emergency Drugs available, Suction available and Patient being monitored Patient Re-evaluated:Patient Re-evaluated prior to induction Oxygen Delivery Method: Circle system utilized Preoxygenation: Pre-oxygenation with 100% oxygen Induction Type: IV induction Ventilation: Mask ventilation without difficulty LMA: LMA inserted LMA Size: 4.0 Number of attempts: 1 Airway Equipment and Method: Bite block Placement Confirmation: positive ETCO2 Tube secured with: Tape Dental Injury: Teeth and Oropharynx as per pre-operative assessment

## 2024-07-25 NOTE — Op Note (Signed)
 Preoperative diagnosis: chronic mastectomy wound Postoperative diagnosis: saa Procedure: excision of 8x3 cm chronic wound Dr Adina Bury Anesthesia general Drains none Specimens: chronic wound and surrounding tissue marked short superior, long lateral EBL: minimal Complications none Sponge and needle count correct Dispo recovery stable  Indications: 73 year old female status post a right mastectomy and sentinel node. 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 This previously was ER/PR positive HER2 negative. She has 1 sentinel lymph node that is negative. She had an area that opened at the lateral portion of her mastectomy. She is on letrozole . I have taken out her port. She returns today still with pain at right mastectomy laterally with open wound we discussed excising this wound  Procedure: After informed consent obtained she was taken to the OR. Antibiotics given. SCDs in place.  She was placed under general anesthesia without complication.  She was prepped and draped in standard sterile surgical fashion. Timeout performed.  I excised the wound and surrounding tissue along with some fat measuring 8x3 cm for wound.  This was done to healthy tissue. I then passed this off the table as a specimen.  I then placed a TXA sponge hemostasis was obtained. I then closed with 3-0 vicryl and 4-0 monocryl.  I placed several 3-0 nylon external sutures as well. Steristrips applied. She tolerated well and was transferred to pacu.

## 2024-07-25 NOTE — H&P (Signed)
 73 year old female status post a right mastectomy and sentinel node. 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 This previously was ER/PR positive HER2 negative. She has 1 sentinel lymph node that is negative. She had an area that opened at the lateral portion of her mastectomy. She is on letrozole . I have taken out her port. She returns today still with pain at right mastectomy laterally with open wound.   Review of Systems: A complete review of systems was obtained from the patient. I have reviewed this information and discussed as appropriate with the patient. See HPI as well for other ROS.  Review of Systems  All other systems reviewed and are negative.  Medical History: Past Medical History:  Diagnosis Date  Anxiety  Hypertension  Thyroid  disease   Patient Active Problem List  Diagnosis  Invasive ductal carcinoma of breast, left (CMS/HHS-HCC)  Breast cancer of lower-inner quadrant of right female breast (CMS/HHS-HCC)   Past Surgical History:  Procedure Laterality Date  APPENDECTOMY  ARTHROPLASTY HIP TOTAL  BREAST EXCISIONAL BIOPSY Right  CHOLECYSTECTOMY  KYPHOPLASTY  MASTECTOMY  Radioactive seed guided excisional breast biopsy  TONSILLECTOMY    Allergies  Allergen Reactions  Diazepam Anaphylaxis, Palpitations, Other (See Comments) and Shortness Of Breath  Gadolinium-Containing Contrast Media Anaphylaxis, Other (See Comments), Shortness Of Breath and Unknown  Pt was given 17ml multihance  for MRI. After about 58min30 seconds she squeezed the ball saying she felt like her throat was closing up. Exam was DC'd and radiologist gave 75mg  benadryl  IV.  Iodine  Anaphylaxis  Iohexol  Itching  Desc: Pt. stated she had iv contrast around 25 yrs ago and had itching on her neck. she took benadryl  and it went away. The rad recommended premeds and be done tomorrow but the pt.did not want to do that so we did her w/o iv contrast today., Onset Date:  [id number] Doxycycline  Swelling  Facial swelling  Doxycycline  Calcium  Swelling  Gabapentin  Hives and Other (See Comments)  Nirmatrelvir-Ritonavir Hives  Nitrofurantoin Nausea And Vomiting and Vomiting  Red Dye Swelling  Facial swelling (cannot take pink or red tablets or capsules)   Current Outpatient Medications on File Prior to Visit  Medication Sig Dispense Refill  butalbital -acetaminophen -caffeine  (FIORICET) 50-325-40 mg tablet Take 1 tablet by mouth  DULoxetine (CYMBALTA) 30 MG DR capsule Take 30 mg by mouth once daily  levothyroxine  (SYNTHROID ) 75 MCG tablet TAKE 1 TABLET BY MOUTH EVERY DAY IN THE MORNING ON EMPTY STOMACH  loratadine  (CLARITIN ) 10 mg tablet Take by mouth  montelukast  (SINGULAIR ) 10 mg tablet Take 1 tablet by mouth once daily  traZODone  (DESYREL ) 100 MG tablet Take 100 mg by mouth once daily  albuterol  90 mcg/actuation inhaler INHALE 1-2 PUFFS EVERY 6 HOURS AS NEEDED  aspirin  325 MG EC tablet Take by mouth  methocarbamoL  (ROBAXIN ) 500 MG tablet Take 500 mg by mouth 3 (three) times daily  metoclopramide  (REGLAN ) 10 MG tablet Take 10 mg by mouth  OLANZapine  (ZYPREXA ) 2.5 MG tablet Take 2.5 mg by mouth  oxyBUTYnin  (DITROPAN -XL) 5 MG XL tablet Take by mouth  pantoprazole  (PROTONIX ) 40 MG DR tablet Take 40 mg by mouth once daily  potassium acetate 2 mEq/mL injection  potassium chloride  (KLOR-CON  M10) 10 mEq ER tablet Take 10 mEq by mouth 2 (two) times daily  sulfamethoxazole -trimethoprim  (BACTRIM  DS) 800-160 mg tablet (Patient not taking: Reported on 04/11/2024)    Family History  Problem Relation Age of Onset  Skin cancer Mother  High blood pressure (Hypertension) Mother  Hyperlipidemia (Elevated cholesterol) Mother  Heart valve disease Father  High blood pressure (Hypertension) Father  Hyperlipidemia (Elevated cholesterol) Father  Diabetes Father    Social History   Tobacco Use  Smoking Status Never  Smokeless Tobacco Never  Marital status: Married   Tobacco Use  Smoking status: Never  Smokeless tobacco: Never  Vaping Use  Vaping status: Never Used  Substance and Sexual Activity  Alcohol  use: Never  Drug use: Never    Objective:   Physical Exam Constitutional:  Appearance: Normal appearance.  Chest:  Comments: Port incision healing well, right mastectomy scar with 3 cm open area that is about 5 mm wide, no infection, some excess lateral tissue Neurological:  Mental Status: She is alert.   Assessment and Plan:   Malignant neoplasm of lower-inner quadrant of right breast of female, estrogen receptor positive (CMS/HHS-HCC)   We have tried to get this area at the lateral portion of the mastectomy to heal. This is not healed with any real conservative measures. I think the best choice at this point is to go excise this chronic wound and some of the excess lateral tissue. We discussed the surgery as well as risks and recovery associated with that we will try to do this soon per her request.

## 2024-07-25 NOTE — Transfer of Care (Signed)
 Immediate Anesthesia Transfer of Care Note  Patient: Rebecca Steele  Procedure(s) Performed: Procedure(s) (LRB): EXCISION, MASS, CHEST WALL (Right)  Patient Location: PACU  Anesthesia Type: General  Level of Consciousness: awake, oriented, sedated and patient cooperative  Airway & Oxygen Therapy: Patient Spontanous Breathing and Patient connected to oxygen  Post-op Assessment: Report given to PACU RN and Post -op Vital signs reviewed and stable  Post vital signs: Reviewed and stable  Complications: No apparent anesthesia complications  Last Vitals:  Vitals Value Taken Time  BP 146/70 07/25/24 10:30  Temp    Pulse 83 07/25/24 10:33  Resp 11 07/25/24 10:33  SpO2 99 % 07/25/24 10:33  Vitals shown include unfiled device data.  Last Pain:  Vitals:   07/25/24 0811  TempSrc: Temporal  PainSc: 0-No pain      Patients Stated Pain Goal: 4 (07/25/24 9188)  Complications: No notable events documented.

## 2024-07-25 NOTE — Discharge Instructions (Addendum)
 [facility] [phone]  Take 400 mg of ibuprofen every 8 hours or 650 mg tylenol  every 6 hours for next 72 hours then as needed. Use ice several times daily also.   Take your usually prescribed medications unless otherwise directed. You should follow a light diet the first 24 hours after surgery.  Resume your normal diet the day after surgery. Most patients will experience some swelling and bruising on the chest and underarm.  Ice packs will help.  Swelling and bruising can take several days to resolve. Wear the binder or surgical bra for 72 hours day and night. After that please wear during the day when up and around only. You do not have to wear at night unless you want to.  It is common to experience some constipation if taking pain medication after surgery.  Increasing fluid intake and taking a stool softener (such as Colace) will usually help or prevent this problem from occurring.  A mild laxative (Milk of Magnesia or Miralax ) should be taken according to package instructions if there are no bowel movements after 48 hours. There is glue and steristrips on your incision. They will come off in the next few weeks.  You may take a shower 48 hours after surgery.  Any sutures will be removed at an office visit in two weeks.  ACTIVITIES:  You may resume regular (light) daily activities beginning the next day--such as daily self-care, walking, climbing stairs--gradually increasing activities as tolerated. Refrain from any heavy lifting or straining until approved by your doctor. You may drive when you are no longer taking prescription pain medication, you can comfortably wear a seatbelt, and you can safely maneuver your car and apply brakes. RETURN TO WORK:  __________________________________________________________ Rebecca Steele should see your doctor in the office for a follow-up appointment approximately 2-3 after your surgery.  Your doctor's nurse will typically make your follow-up appointment  when she calls you with your pathology report.  Expect your pathology report 3-4 business days after surgery. WHEN TO CALL YOUR DR [place]: Fever over 101.0 Nausea and/or vomiting Extreme swelling or bruising Continued bleeding from incision. Increased pain, redness, or drainage from the incision. The clinic staff is available to answer your questions during regular business hours.  Please don't hesitate to call and ask to speak to one of the nurses for clinical concerns.  If you have a medical emergency, go to the nearest emergency room or call 911.  A surgeon from [facility] is always on call at the hospital.  [address], [address], [place], [place]  [address] ? [address] [address], [place], [place]   [address] [phone] ? [phone] ? FAX [phone] Web site: [email or web address]   No Tylenol  before 2:15pm.   Post Anesthesia Home Care Instructions  Activity: Get plenty of rest for the remainder of the day. A responsible individual must stay with you for 24 hours following the procedure.  For the next 24 hours, DO NOT: -Drive a car -Advertising copywriter -Drink alcoholic beverages -Take any medication unless instructed by your physician -Make any legal decisions or sign important papers.  Meals: Start with liquid foods such as gelatin or soup. Progress to regular foods as tolerated. Avoid greasy, spicy, heavy foods. If nausea and/or vomiting occur, drink only clear liquids until the nausea and/or vomiting subsides. Call your physician if vomiting continues.  Special Instructions/Symptoms: Your throat may feel dry or sore from the anesthesia or the breathing tube placed in your throat during surgery. If  this causes discomfort, gargle with warm salt water . The discomfort should disappear within 24 hours.

## 2024-07-26 ENCOUNTER — Encounter (HOSPITAL_BASED_OUTPATIENT_CLINIC_OR_DEPARTMENT_OTHER): Payer: Self-pay | Admitting: General Surgery

## 2024-07-29 LAB — SURGICAL PATHOLOGY

## 2024-08-08 ENCOUNTER — Telehealth: Payer: Self-pay

## 2024-08-08 NOTE — Telephone Encounter (Signed)
 Left message on voicemail about upcoming appointment on 11/7

## 2024-08-09 ENCOUNTER — Inpatient Hospital Stay: Attending: Hematology and Oncology | Admitting: Hematology and Oncology

## 2024-08-09 VITALS — BP 118/80 | HR 72 | Temp 97.8°F | Resp 18 | Ht 65.0 in | Wt 164.5 lb

## 2024-08-09 DIAGNOSIS — Z171 Estrogen receptor negative status [ER-]: Secondary | ICD-10-CM | POA: Insufficient documentation

## 2024-08-09 DIAGNOSIS — Z79899 Other long term (current) drug therapy: Secondary | ICD-10-CM | POA: Insufficient documentation

## 2024-08-09 DIAGNOSIS — Z9013 Acquired absence of bilateral breasts and nipples: Secondary | ICD-10-CM | POA: Diagnosis not present

## 2024-08-09 DIAGNOSIS — Z803 Family history of malignant neoplasm of breast: Secondary | ICD-10-CM | POA: Insufficient documentation

## 2024-08-09 DIAGNOSIS — C50212 Malignant neoplasm of upper-inner quadrant of left female breast: Secondary | ICD-10-CM | POA: Insufficient documentation

## 2024-08-09 DIAGNOSIS — C50211 Malignant neoplasm of upper-inner quadrant of right female breast: Secondary | ICD-10-CM | POA: Insufficient documentation

## 2024-08-09 DIAGNOSIS — Z170 Estrogen receptor positive status [ER+]: Secondary | ICD-10-CM | POA: Insufficient documentation

## 2024-08-09 DIAGNOSIS — Z1721 Progesterone receptor positive status: Secondary | ICD-10-CM | POA: Diagnosis not present

## 2024-08-09 DIAGNOSIS — Z1732 Human epidermal growth factor receptor 2 negative status: Secondary | ICD-10-CM | POA: Diagnosis not present

## 2024-08-09 DIAGNOSIS — C773 Secondary and unspecified malignant neoplasm of axilla and upper limb lymph nodes: Secondary | ICD-10-CM | POA: Diagnosis present

## 2024-08-09 DIAGNOSIS — Z79811 Long term (current) use of aromatase inhibitors: Secondary | ICD-10-CM | POA: Diagnosis not present

## 2024-08-09 DIAGNOSIS — Z1731 Human epidermal growth factor receptor 2 positive status: Secondary | ICD-10-CM | POA: Diagnosis not present

## 2024-08-09 DIAGNOSIS — Z1722 Progesterone receptor negative status: Secondary | ICD-10-CM | POA: Diagnosis not present

## 2024-08-09 NOTE — Progress Notes (Signed)
 [facility] CONSULT NOTE  Patient Care Team: Rebecca Jon CROME, MD as PCP - General (Internal Medicine) Rebecca Cough, MD as Consulting Physician (General Surgery) Rebecca Agent, MD as Consulting Physician (Obstetrics and Gynecology) Rebecca Rush, MD (Inactive) as Consulting Physician (Gastroenterology) Rebecca Ash, MD as Consulting Physician (Hematology and Oncology) Rebecca Nanetta SAILOR, RN as Oncology Nurse Navigator  CHIEF COMPLAINTS/PURPOSE OF CONSULTATION:  Breast cancer follow up.  HISTORY OF PRESENTING ILLNESS:   Rebecca Steele 73 y.o. female is here because of recent diagnosis of left breast cancer  I reviewed her records extensively and collaborated the history with the patient.  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)  09/22/2023 Mammogram   Patient self palpated a breast mass in her left breast around November 2019 went for diagnostic mammogram, this showed there is a developing density on mammogram, 2 of the lower left axillary lymph nodes exhibit increased cortical thickness, ultrasound confirmed a 2.7 cm irregular hypoechoic mass in left breast at 12:00 5 cm from the nipple along with abnormal left axillary lymph   09/29/2023 Pathology Results   Left breast central superior needle core biopsy 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)   10/20/2023 - 10/23/2023 Chemotherapy   Patient is on Treatment Plan : BREAST  Docetaxel  + Carboplatin  + Trastuzumab  + Pertuzumab   (TCHP) q21d      11/24/2023 Cancer Staging   Staging form: Breast, AJCC 8th Edition - Pathologic stage from 11/24/2023: ypT2, pN1, cM0, G3, ER-,  PR-, HER2+ - Signed by Rebecca Ash, MD on 11/24/2023 Stage prefix: Post-therapy Histologic grading system: 3 grade system   01/16/2024 - 03/19/2024 Chemotherapy   Patient is on Treatment Plan : BREAST ADO-Trastuzumab Emtansine  (Kadcyla ) q21d     Malignant neoplasm of right breast (HCC)  05/09/2024 Initial Diagnosis   Malignant neoplasm of right breast (HCC)   05/09/2024 Cancer Staging   Staging form: Breast, AJCC 8th Edition - Clinical: Stage IB (cT2, cN0, cM0, G2, ER+, PR+, HER2-) - Signed by Rebecca Ash, MD on 05/09/2024 Histologic grading system: 3 grade system     She had left breast lumpectomy which showed Invasive poorly differentiated adenocarcinoma, grade 3, focal high grade DCIS, solid type without necrosis. Tumor measures 3.6 cms in greatest dimension.Invasive tumor in lateral and posterior margins. Pos lateral margin, 2/4 LN with macromets, one with ITC. She had re-excision on 12/12/2023.  History of Present Illness   Rebecca Steele is a 73 year old female with a history of recent right mastectomy who presents with persistent wound drainage and pain. Prior to this she had left mastectomy. She had bilateral breast cancer.  She underwent a right mastectomy, which did not heal for three months. Two weeks ago, she had additional surgery to remove more tissue from the area. The wound is now healing but continues to drain significantly, requiring frequent changes of clothing and pads. The drainage is substantial enough to soak through pads and clothing, necessitating multiple outfit changes daily.  She was prescribed an antibiotic due to redness around the wound, a muscle relaxant, and additional pain medication. She also received a sleeve to alleviate arm pain, which extends down her  arm, and finds it provides some comfort.  She is currently taking letrozole  without issues and reports that her neuropathy has improved with Cymbalta. She has gained approximately two pounds and her  hair has regrown. She continues to participate in activities such as handbell practice, adapting to using her left hand due to pain in her right arm. She is also performing exercises recommended by physical therapy for her right arm, although she cannot complete them fully at this time.  She continues to take trazodone  for insomnia and reports getting some sleep at night. She reports no trouble with bowel movements or urination.  Rest of the pertinent 10 point ROS reviewed and neg.  MEDICAL HISTORY:  Past Medical History:  Diagnosis Date   Breast mass, right    Cancer (HCC) 10/2023   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   Sleep apnea    HAS MILD OSA, NO CPAP NEEDED    SURGICAL HISTORY: Past Surgical History:  Procedure Laterality Date   APPENDECTOMY     AXILLARY SENTINEL NODE BIOPSY Right 04/16/2024   Procedure: BIOPSY, LYMPH NODE, SENTINEL, AXILLARY;  Surgeon: Rebecca Cough, MD;  Location: [facility];  Service: General;  Laterality: Right;  RIGHT MASTY RIGHT AXILLARY SENTINEL NODE BIOPSY   BREAST BIOPSY Right 09/16/2022   MM RT BREAST BX W LOC DEV 1ST LESION IMAGE BX SPEC STEREO GUIDE 09/16/2022 [facility]   BREAST BIOPSY  12/06/2022   MM RT RADIOACTIVE SEED LOC MAMMO GUIDE 12/06/2022 [facility]   BREAST BIOPSY Left 11/16/2023   US  LT RADIOACTIVE SEED LOC 11/16/2023 [facility]   BREAST BIOPSY  11/16/2023   MM LT RADIOACTIVE SEED EA ADD LESION LOC MAMMO GUIDE 11/16/2023 [facility]   BREAST BIOPSY Right 04/01/2024   US  RT BREAST  BX W LOC DEV 1ST LESION IMG BX SPEC US  GUIDE 04/01/2024 [facility]   BREAST EXCISIONAL BIOPSY Right 2018   BREAST LUMPECTOMY WITH RADIOACTIVE SEED AND SENTINEL LYMPH NODE BIOPSY Left 11/20/2023   Procedure: LEFT BREAST SEED GUIDED LUMPECTOMY, LEFT AXILLARY SENTINEL NODE BIOPSY;  Surgeon: Rebecca Cough, MD;  Location: [facility];  Service: General;  Laterality: Left;  PEC BLOCK   CHOLECYSTECTOMY     CYSTOSCOPY W/ RETROGRADES     KYPHOPLASTY N/A 10/05/2021   Procedure: LUMBAR ONE KYPHOPLASTY;  Surgeon: Cheryle Debby LABOR, MD;  Location: [facility];  Service: Neurosurgery;  Laterality: N/A;   MASS EXCISION Right 07/25/2024   Procedure: EXCISION, MASS, CHEST WALL;  Surgeon: Rebecca Cough, MD;  Location: [facility];  Service: General;  Laterality: Right;  EXCISION RIGHT MASTECTOMY WOUND   NECK SURGERY     PORTACATH PLACEMENT Right 10/12/2023   Procedure: INSERTION PORT-A-CATH WITH GUIDED ULTRASOUND;  Surgeon: Rebecca Cough, MD;  Location: [facility];  Service: General;  Laterality: Right;   RADIOACTIVE SEED GUIDED AXILLARY SENTINEL LYMPH NODE Left 11/20/2023   Procedure: LEFT AXILLARY NODE SEED GUIDED EXCISION;  Surgeon: Rebecca Cough,  MD;  Location: [facility];  Service: General;  Laterality: Left;   RADIOACTIVE SEED GUIDED EXCISIONAL BREAST BIOPSY Right 12/12/2017   Procedure: RIGHT RADIOACTIVE SEED GUIDED EXCISIONAL BREAST BIOPSY ERAS PATHWAY;  Surgeon: Rebecca Cough, MD;  Location: [facility];  Service: General;  Laterality: Right;  LMA   RADIOACTIVE SEED GUIDED EXCISIONAL BREAST BIOPSY Right 12/07/2022   Procedure: RADIOACTIVE SEED GUIDED EXCISIONAL RIGHT BREAST BIOPSY;  Surgeon: Rebecca Cough, MD;  Location: [facility];  Service: General;  Laterality: Right;   SHOULDER SURGERY     SIMPLE MASTECTOMY WITH AXILLARY SENTINEL NODE BIOPSY Left 12/12/2023   Procedure: LEFT   MASTECTOMY;  Surgeon: Rebecca Cough, MD;  Location: [facility];  Service: General;  Laterality: Left;   TONSILLECTOMY     TOTAL HIP ARTHROPLASTY Left 06/04/2021   Procedure: TOTAL HIP ARTHROPLASTY ANTERIOR APPROACH;  Surgeon: Yvone Rush, MD;  Location: [facility];  Service: Orthopedics;  Laterality: Left;   TOTAL MASTECTOMY Right 04/16/2024   Procedure: MASTECTOMY, SIMPLE;  Surgeon: Rebecca Cough, MD;  Location: [facility];  Service: General;  Laterality: Right;   TRIGGER FINGER RELEASE Right 05/18/2015   Procedure: RIGHT  LONG FINGER TRIGGER RELEASE ;  Surgeon: Alm Hummer, MD;  Location: [facility];  Service: Orthopedics;  Laterality: Right;   TUBAL LIGATION      SOCIAL HISTORY: Social History   Socioeconomic History   Marital status: Married    Spouse name: Not on file   Number of children: Not on file   Years of education: Not on file   Highest education level: Not on file  Occupational History   Not on file  Tobacco Use   Smoking status: Never   Smokeless tobacco: Never  Vaping Use   Vaping status: Never Used  Substance and Sexual Activity   Alcohol  use: No   Drug use: No   Sexual activity: Not Currently    Birth control/protection: Post-menopausal  Other Topics Concern   Not on file  Social History Narrative   Not on file   Social Drivers of Health   Financial Resource Strain: Low Risk  (01/01/2024)   Received from [facility]   Overall Financial Resource Strain (CARDIA)    Difficulty of Paying Living Expenses: Not hard at all  Food Insecurity: No Food Insecurity (04/16/2024)   Hunger Vital Sign    Worried About Running Out of Food in the Last Year: Never true    Ran Out of Food in the Last Year: Never true  Transportation Needs: No Transportation Needs (04/16/2024)   PRAPARE - Administrator, Civil Service (Medical): No    Lack of Transportation (Non-Medical): No  Physical Activity: Inactive (04/24/2023)   Received  from [facility]   Exercise Vital Sign    On average, how many days per week do you engage in moderate to strenuous exercise (like a brisk walk)?: 0 days    On average, how many minutes do you engage in exercise at this level?: 10 min  Stress: No Stress Concern Present (04/24/2023)   Received from [facility] - Occupational Stress Questionnaire    Feeling of Stress : Not at all  Social Connections: Socially Integrated (04/16/2024)   Social Connection and Isolation Panel    Frequency of Communication with Friends and Family: More than three times a week    Frequency of Social Gatherings with Friends and Family: More than three  times a week    Attends Religious Services: More than 4 times per year    Active Member of Clubs or Organizations: Yes    Attends Banker Meetings: More than 4 times per year    Marital Status: Married  Catering Manager Violence: Not At Risk (04/16/2024)   Humiliation, Afraid, Rape, and Kick questionnaire    Fear of Current or Ex-Partner: No    Emotionally Abused: No    Physically Abused: No    Sexually Abused: No    FAMILY HISTORY: Family History  Problem Relation Age of Onset   Breast cancer Maternal Aunt        43s    ALLERGIES:  is allergic to gadolinium derivatives, other, valium, paxlovid [nirmatrelvir-ritonavir], fentanyl , iohexol , macrodantin, red dye #40 (allura red), rocephin [ceftriaxone], tape, and zithromax [azithromycin].  MEDICATIONS:  Current Outpatient Medications  Medication Sig Dispense Refill   butalbital -acetaminophen -caffeine  (FIORICET) 50-325-40 MG tablet Take 1 tablet by mouth 2 (two) times daily as needed for headache or migraine.     DULoxetine (CYMBALTA) 30 MG capsule Take 30 mg by mouth daily.     letrozole  (FEMARA ) 2.5 MG tablet Take 1 tablet (2.5 mg total) by mouth daily. 90 tablet 3   levothyroxine  (SYNTHROID ) 75 MCG tablet Take 75 mcg by mouth daily before breakfast.      loratadine  (CLARITIN ) 10 MG tablet Take 10 mg by mouth daily.     methocarbamol  (ROBAXIN ) 500 MG tablet Take 500 mg by mouth.     montelukast  (SINGULAIR ) 10 MG tablet Take 10 mg by mouth at bedtime.     sulfamethoxazole -trimethoprim  (BACTRIM  DS) 800-160 MG tablet Take by mouth.     traMADol  (ULTRAM ) 50 MG tablet Take 1 tablet (50 mg total) by mouth every 6 (six) hours as needed. 10 tablet 0   traZODone  (DESYREL ) 100 MG tablet Take 100 mg by mouth at bedtime.     sucralfate  (CARAFATE ) 1 GM/10ML suspension Take 10 mLs (1 g total) by mouth 4 (four) times daily -  with meals and at bedtime. (Patient not taking: Reported on 08/09/2024) 414 mL 0   No current facility-administered medications for this visit.    PHYSICAL EXAMINATION: ECOG PERFORMANCE STATUS: 0 - Asymptomatic  BP 118/80   Pulse 72   Temp 97.8 F (36.6 C) (Temporal)   Resp 18   Ht 5' 5 (1.651 m)   Wt 164 lb 8 oz (74.6 kg)   SpO2 100%   BMI 27.37 kg/m    GENERAL appears well. No acute distress. CTA bilaterally RRR Drainage noted right breast soaking through clothes. No foul smell. Didn't remove the bandage for the breast exam No abdominal tenderness guarding and rigidity No LE edema.  LABORATORY DATA:  I have reviewed the data as listed Lab Results  Component Value Date   WBC 4.6 04/10/2024   HGB 14.8 04/10/2024   HCT 43.9 04/10/2024   MCV 88.5 04/10/2024   PLT 228 04/10/2024   Lab Results  Component Value Date   NA 139 04/17/2024   K 3.0 (L) 04/17/2024   CL 107 04/17/2024   CO2 24 04/17/2024    RADIOGRAPHIC STUDIES: I have personally reviewed the radiological reports and agreed with the findings in the report.  ASSESSMENT AND PLAN: Assessment & Plan This is a very pleasant 73 year old postmenopausal female patient with Left breast IDC, ER PR neg, her 2 positive attempted neoadj TCHP, only could tolerate one cycle, then went on to surgery, tried adj kadcyla , she  couldn't tolerate this as well. She had  multiple hospitalizations during treatment. While she was on adj treatment, we discovered a right sided breast cancer which was her 2 neg. She is now s/p bilateral mastectomy  Bilateral breast cancer (ER/PR+ right, HER2+ left) status post bilateral mastectomy Bilateral breast cancer with ER/PR positive on the right and HER2 positive on the left. Status post bilateral mastectomy. Right-sided cancer is HER2 negative, strongly ER/PR positive. Left-sided cancer was HER2 positive.  No further chemotherapy planned because of poor tolerance or radiation needed due to small tumor size and negative margins. - She is now on adj letrozole . She is tolerating this very well. - Monitor bone density every two years.  Right breast surgical wound with persistent drainage and pain Persistent drainage and pain post-mastectomy and additional surgery. Wound healing but significant drainage persists. Antibiotics prescribed for wound redness. Pain managed with medications and muscle relaxants. Sleeve provided for arm pain. - Continue pain management with muscle relaxants and pain medication. - Use sleeve for arm comfort. - Follow up with surgeon in two weeks..  Right upper extremity pain and functional limitation post-mastectomy Pain and functional limitation in right upper extremity post-mastectomy. Pain extends down arm, limiting hand use. Exercises provided for future use. - Use sleeve for arm comfort. - Perform exercises as tolerated.  Peripheral neuropathy, stable on duloxetine Peripheral neuropathy well-managed with duloxetine. - Continue duloxetine therapy.  Insomnia, stable on trazodone  Insomnia managed with trazodone . - Continue trazodone  therapy.  Total time spent: 30 min including history, exam, discussion of options, review of records, counseling and coordination of care  All questions were answered. The patient knows to call the clinic with any problems, questions or concerns.    Amber Stalls,  MD 08/09/24

## 2024-08-27 ENCOUNTER — Other Ambulatory Visit: Payer: Self-pay

## 2024-08-27 ENCOUNTER — Encounter: Payer: Self-pay | Admitting: Rehabilitation

## 2024-08-27 ENCOUNTER — Ambulatory Visit: Attending: General Surgery | Admitting: Rehabilitation

## 2024-08-27 DIAGNOSIS — Z171 Estrogen receptor negative status [ER-]: Secondary | ICD-10-CM | POA: Diagnosis present

## 2024-08-27 DIAGNOSIS — C50212 Malignant neoplasm of upper-inner quadrant of left female breast: Secondary | ICD-10-CM | POA: Diagnosis present

## 2024-08-27 DIAGNOSIS — Z483 Aftercare following surgery for neoplasm: Secondary | ICD-10-CM | POA: Insufficient documentation

## 2024-08-27 DIAGNOSIS — C50311 Malignant neoplasm of lower-inner quadrant of right female breast: Secondary | ICD-10-CM | POA: Diagnosis present

## 2024-08-27 DIAGNOSIS — M25612 Stiffness of left shoulder, not elsewhere classified: Secondary | ICD-10-CM | POA: Diagnosis present

## 2024-08-27 DIAGNOSIS — Z170 Estrogen receptor positive status [ER+]: Secondary | ICD-10-CM | POA: Insufficient documentation

## 2024-08-27 DIAGNOSIS — R293 Abnormal posture: Secondary | ICD-10-CM | POA: Insufficient documentation

## 2024-08-27 DIAGNOSIS — M25611 Stiffness of right shoulder, not elsewhere classified: Secondary | ICD-10-CM | POA: Insufficient documentation

## 2024-08-27 NOTE — Therapy (Signed)
 OUTPATIENT PHYSICAL THERAPY BREAST CANCER POST OP FOLLOW UP   Patient Name: Rebecca Steele MRN: [id number] DOB:1951/03/04, 73 y.o., female Today's Date: 08/27/2024  END OF SESSION:  PT End of Session - 08/27/24 1131     Visit Number 1    Number of Visits 13    Date for Recertification  10/08/24    Authorization Type no auth    PT Start Time 1100    PT Stop Time 1136    PT Time Calculation (min) 36 min    Activity Tolerance Patient tolerated treatment well    Behavior During Therapy WFL for tasks assessed/performed          Past Medical History:  Diagnosis Date   Breast mass, right    Cancer (HCC) 10/2023   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   Sleep apnea    HAS MILD OSA, NO CPAP NEEDED   Past Surgical History:  Procedure Laterality Date   APPENDECTOMY     AXILLARY SENTINEL NODE BIOPSY Right 04/16/2024   Procedure: BIOPSY, LYMPH NODE, SENTINEL, AXILLARY;  Surgeon: Ebbie Cough, MD;  Location: [facility];  Service: General;  Laterality: Right;  RIGHT MASTY RIGHT AXILLARY SENTINEL NODE BIOPSY   BREAST BIOPSY Right 09/16/2022   MM RT BREAST BX W LOC DEV 1ST LESION IMAGE BX SPEC STEREO GUIDE 09/16/2022 [facility]   BREAST BIOPSY  12/06/2022   MM RT RADIOACTIVE SEED LOC MAMMO GUIDE 12/06/2022 [facility]   BREAST BIOPSY Left 11/16/2023   US  LT RADIOACTIVE SEED LOC 11/16/2023 [facility]   BREAST BIOPSY  11/16/2023   MM LT RADIOACTIVE SEED EA ADD LESION LOC MAMMO GUIDE 11/16/2023 [facility]   BREAST BIOPSY Right  04/01/2024   US  RT BREAST BX W LOC DEV 1ST LESION IMG BX SPEC US  GUIDE 04/01/2024 [facility]   BREAST EXCISIONAL BIOPSY Right 2018   BREAST LUMPECTOMY WITH RADIOACTIVE SEED AND SENTINEL LYMPH NODE BIOPSY Left 11/20/2023   Procedure: LEFT BREAST SEED GUIDED LUMPECTOMY, LEFT AXILLARY SENTINEL NODE BIOPSY;  Surgeon: Ebbie Cough, MD;  Location: [facility];  Service: General;  Laterality: Left;  PEC BLOCK   CHOLECYSTECTOMY     CYSTOSCOPY W/ RETROGRADES     KYPHOPLASTY N/A 10/05/2021   Procedure: LUMBAR ONE KYPHOPLASTY;  Surgeon: Cheryle Debby LABOR, MD;  Location: [facility];  Service: Neurosurgery;  Laterality: N/A;   MASS EXCISION Right 07/25/2024   Procedure: EXCISION, MASS, CHEST WALL;  Surgeon: Ebbie Cough, MD;  Location: [facility];  Service: General;  Laterality: Right;  EXCISION RIGHT MASTECTOMY WOUND   NECK SURGERY     PORTACATH PLACEMENT Right 10/12/2023   Procedure: INSERTION PORT-A-CATH WITH GUIDED ULTRASOUND;  Surgeon: Ebbie Cough, MD;  Location: [facility];  Service: General;  Laterality: Right;   RADIOACTIVE SEED GUIDED AXILLARY SENTINEL LYMPH NODE Left 11/20/2023   Procedure: LEFT AXILLARY NODE SEED GUIDED EXCISION;  Surgeon: Ebbie Cough, MD;  Location: [facility];  Service: General;  Laterality: Left;   RADIOACTIVE SEED GUIDED EXCISIONAL BREAST BIOPSY Right 12/12/2017   Procedure: RIGHT RADIOACTIVE SEED GUIDED EXCISIONAL BREAST BIOPSY ERAS PATHWAY;  Surgeon: Ebbie Cough, MD;  Location: [facility];  Service: General;  Laterality: Right;  LMA   RADIOACTIVE SEED GUIDED EXCISIONAL BREAST BIOPSY Right 12/07/2022   Procedure: RADIOACTIVE SEED GUIDED EXCISIONAL RIGHT BREAST BIOPSY;  Surgeon: Ebbie Cough, MD;  Location: [facility];  Service: General;  Laterality: Right;   SHOULDER SURGERY     SIMPLE MASTECTOMY WITH AXILLARY SENTINEL NODE BIOPSY Left 12/12/2023    Procedure: LEFT  MASTECTOMY;  Surgeon: Ebbie Cough, MD;  Location: [facility];  Service: General;  Laterality: Left;   TONSILLECTOMY     TOTAL HIP ARTHROPLASTY Left 06/04/2021   Procedure: TOTAL HIP ARTHROPLASTY ANTERIOR APPROACH;  Surgeon: Yvone Rush, MD;  Location: [facility];  Service: Orthopedics;  Laterality: Left;   TOTAL MASTECTOMY Right 04/16/2024   Procedure: MASTECTOMY, SIMPLE;  Surgeon: Ebbie Cough, MD;  Location: [facility];  Service: General;  Laterality: Right;   TRIGGER FINGER RELEASE Right 05/18/2015   Procedure: RIGHT  LONG FINGER TRIGGER RELEASE ;  Surgeon: Alm Hummer, MD;  Location:  SUR[facility]ERY [facility];  Service: Orthopedics;  Laterality: Right;   TUBAL LIGATION     Patient Active Problem List   Diagnosis Date Noted   Malignant neoplasm of right breast (HCC) 05/09/2024   S/P mastectomy, right 04/16/2024   Intractable nausea and vomiting 04/01/2024   Dehydration 04/01/2024   Epigastric pain 04/01/2024   High anion gap metabolic acidosis 04/01/2024   Hypercalcemia 04/01/2024   Hyperbilirubinemia 04/01/2024   Hypophosphatemia 04/01/2024   Chronic diastolic CHF (congestive heart failure) (HCC) 04/01/2024   Acquired hypothyroidism 04/01/2024   Allergic rhinitis 04/01/2024   Depression 04/01/2024   Nausea & vomiting 03/10/2024   Chemotherapy induced nausea and vomiting 03/10/2024   S/P left mastectomy 12/12/2023   Breast cancer, left breast (HCC) 11/20/2023   Port-A-Cath in place 10/18/2023   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) 10/09/2023   Closed compression fracture of body of L1 vertebra (HCC) 10/05/2021   Compression fracture of L1 lumbar vertebra (HCC) 10/04/2021   Fall at home, initial encounter 10/04/2021   Hypokalemia 10/04/2021   Scalp laceration 10/04/2021   Leukocytosis 10/04/2021   Primary osteoarthritis of left hip 06/04/2021   Atypical ductal hyperplasia of right breast 03/19/2018    Obtundation 12/12/2017    PCP: Jon Kiang, MD  REFERRING PROVIDER: Marena Ebbie, MD  REFERRING DIAG: [phone] (ICD-10-CM) - Other specified postprocedural states C50.311 (ICD-10-CM) - Malignant neoplasm of lower-inner quadrant of right female breast Z17.0 (ICD-10-CM) - Estrogen receptor positive status (ER+) C50.912 (ICD-10-CM) - Malignant neoplasm of unspecified site of left female breast  THERAPY DIAG:  Malignant neoplasm of upper-inner quadrant of left breast in female, estrogen receptor negative (HCC)  Malignant neoplasm of lower-inner quadrant of right breast of female, estrogen receptor positive (HCC)  Aftercare following surgery for neoplasm  Stiffness of right shoulder, not elsewhere classified  Stiffness of left shoulder, not elsewhere classified  Abnormal posture  Rationale for Evaluation and Treatment: Rehabilitation  ONSET DATE: 04/16/24  SUBJECTIVE:  SUBJECTIVE STATEMENT: Patient was referred due to shoulder ROM and strength deficits bilaterally. She reports her incision site is doing a lot better. Patient reports her pain has gotten up to a 7/10 at its worst, and a 3/10 at its best. The burning sensation goes all the way into her hand. The patient has a compression sleeve however it is so tight that it has been bruising her wrist. Patient will wake up in the middle of the night with both arms feeling like they are asleep. She has been sleeping on the sofa since surgery as she cannot get comfortable in bed.   PERTINENT HISTORY:  Patient was diagnosed on 04/01/24 with right grade 2 IDC. It measured 2 cm and is located on the lower inner quadrant. Patient had a right mastectomy and sentinel node biopsy on 04/16/24. The mastectomy took 3 months to heal and underwent additional surgery to  remove more tissue from the area about a month ago. Patient was diagnosed on 09/22/23 with left grade 3 IDC. It measures at least 5mm and is located in the upper inner quadrant. It is ER/PR neg, HER2 unknown with a Ki67 unknown with 1 metastatic lymph node. Other hx: Cervical fusion.   PATIENT GOALS:  Reassess how my recovery is going related to arm function, pain, and swelling.  PAIN:  Are you having pain? Yes: NPRS scale: 3/10 Pain location: Rt arm Pain description: Burning, sore to touch Aggravating factors: Reaching, bending over, shutting car door Relieving factors: Compression sleeve sometimes  PRECAUTIONS: Recent Surgery, right UE Lymphedema risk, hx of compression fracture of her vertebrae ~4 years ago  RED FLAGS: None   ACTIVITY LEVEL / LEISURE: Does music for church, plays the piano   OBJECTIVE:   PATIENT SURVEYS:  QUICK DASH: 56.82% on 08/27/24  OBSERVATIONS: - Chest skin mobile bilaterally - Incision on Rt side had multiple small, open spots; mild drainage observed   POSTURE:  Forward head and rounded shoulders  LYMPHEDEMA ASSESSMENT:   UPPER EXTREMITY AROM/PROM:   A/PROM RIGHT   eval   RIGHT 08/27/24  Shoulder extension 50 46  Shoulder flexion 145 109  Shoulder abduction 165 89 p!  Shoulder internal rotation 75   Shoulder external rotation 80                           (Blank rows = not tested)   A/PROM LEFT   eval LEFT  08/27/24  Shoulder extension 60 58  Shoulder flexion 150 140  Shoulder abduction 160 117 p!  Shoulder internal rotation 75 66  Shoulder external rotation 75 60                          (Blank rows = not tested)   CERVICAL AROM:     Percent limited EVAL  Flexion    Extension 50%  Right lateral flexion    Left lateral flexion    Right rotation 50%  Left rotation        UPPER EXTREMITY STRENGTH:     Eval:  5/5 Lt   5/5 Rt except for ER at 4/5   LYMPHEDEMA ASSESSMENTS (in cm):    LANDMARK RIGHT   eval  RIGHT 08/27/24  10 cm proximal to olecranon process 32 28.2  Olecranon process 28 25.1  10 cm proximal to ulnar styloid process 20.9 19.9  Just proximal to ulnar styloid process 17.2 16.4  Across hand at thumb  web space 19.3 18.8  At base of 2nd digit 6.6 6.5  (Blank rows = not tested)   LANDMARK LEFT   eval LEFT  08/27/24  10 cm proximal to olecranon process 31.8 28.5  Olecranon process 28.5 25.3  10 cm proximal to ulnar styloid process 21.4 20.1  Just proximal to ulnar styloid process 17.8 16.4  Across hand at thumb web space 19.5 18.3  At base of 2nd digit 6.6 6.8  (Blank rows = not tested)   PATIENT EDUCATION:  Education details: POC, HEP, not continuing SOZO Person educated: Patient Education method: Chief Technology Officer Education comprehension: verbalized understanding  HOME EXERCISE PROGRAM: Reviewed previously given post op HEP.  Access Code: [email or web address] URL: [email or web address] Date: 08/27/2024 Prepared by: Delon Pack  Exercises - Seated Shoulder Flexion Towel Slide at Table Top  - 1-2 x daily - 7 x weekly - 1 sets - 10 reps - 2-3 sec hold - Seated Shoulder Abduction Towel Slide at Table Top  - 1-2 x daily - 7 x weekly - 1 sets - 10 reps - 2-3 hold -As well as post op sheet  ASSESSMENT:  CLINICAL IMPRESSION: Patient is a 73 y.o. female who presents to physical therapy following right mastectomy on 04/16/24. The patient has bilateral breast cancer and underwent a left mastectomy in 2024. The patient has bilateral decreased shoulder AROM with the right side having more deficits than the left. Strength testing is not performed due to the patient reporting pain and the incision site not being fully healed. Patient is currently unable to complete functional tasks with her right arm including reaching overhead, closing the car door, and bending over. The patient is advised that she no longer needs to complete SOZO screening due to lymph nodes being  removed bilaterally. The patient would benefit from continued skilled physical therapy to improve functional motion, improve strength, decrease pain, and improve overall QoL.   Pt will benefit from skilled therapeutic intervention to improve on the following deficits: Decreased knowledge of precautions, impaired UE functional use, pain, decreased ROM, postural dysfunction.   PT treatment/interventions: ADL/Self care home management, [phone]- PT Re-evaluation, 97110-Therapeutic exercises, 97530- Therapeutic activity, [id number]- Neuromuscular re-education, 97535- Self Care, [id number]- Manual therapy, Patient/Family education, Manual lymph drainage, Scar mobilization, Compression bandaging, and DME instructions   GOALS: Goals reviewed with patient? Yes  GOALS MET AT EVAL:  GOALS Name Target Date Goal status  1 Pt will be able to verbalize understanding of pertinent lymphedema risk reduction practices relevant to her dx specifically related to skin care.  Baseline:  No knowledge Eval Achieved at eval  2 Pt will be able to return demo and/or verbalize understanding of the post op HEP related to regaining shoulder ROM. Baseline:  No knowledge Eval Achieved at eval  3 Pt will be able to verbalize understanding of the importance of viewing the post op After Breast CA Class video for further lymphedema risk reduction education and therapeutic exercise.  Baseline:  No knowledge Eval Achieved at eval   LONG TERM GOALS:  (STG=LTG)  GOALS Name Target Date  Goal status  1 Pt will demonstrate she has regained full shoulder ROM and function post operatively compared to baselines.  Baseline: 10/09/23 INITIAL  2 Patient will be able to play the piano for 30 minutes with no pain in order to play during church service 10/09/23 INITIAL  3 Patient will improve bilateral shoulder AROM to be at least back to her baseline measurements for functional mobility 10/09/23 INITIAL  4 Patient will decrease her Quick-DASH form by at  least 15 points to demonstrate functional improvement 10/09/23 INITIAL     PLAN:  PT FREQUENCY/DURATION: 2x week for 6 weeks  PLAN FOR NEXT SESSION: Shoulder PROM, table slides on Rt arm, supine dowel flexion, pec stretching   Randall Pack, SPT  08/27/2024, 12:07 PM   I agree with the following treatment note after reviewing documentation. This session was performed under the supervision of a licensed clinician.  Saddie Raw, PT 08/27/24, 12:08 PM

## 2024-09-04 ENCOUNTER — Ambulatory Visit: Attending: General Surgery

## 2024-09-04 DIAGNOSIS — Z171 Estrogen receptor negative status [ER-]: Secondary | ICD-10-CM | POA: Diagnosis present

## 2024-09-04 DIAGNOSIS — M25611 Stiffness of right shoulder, not elsewhere classified: Secondary | ICD-10-CM | POA: Diagnosis present

## 2024-09-04 DIAGNOSIS — R293 Abnormal posture: Secondary | ICD-10-CM | POA: Diagnosis present

## 2024-09-04 DIAGNOSIS — Z170 Estrogen receptor positive status [ER+]: Secondary | ICD-10-CM | POA: Insufficient documentation

## 2024-09-04 DIAGNOSIS — C50311 Malignant neoplasm of lower-inner quadrant of right female breast: Secondary | ICD-10-CM | POA: Insufficient documentation

## 2024-09-04 DIAGNOSIS — C50212 Malignant neoplasm of upper-inner quadrant of left female breast: Secondary | ICD-10-CM | POA: Insufficient documentation

## 2024-09-04 DIAGNOSIS — M25612 Stiffness of left shoulder, not elsewhere classified: Secondary | ICD-10-CM | POA: Insufficient documentation

## 2024-09-04 DIAGNOSIS — Z483 Aftercare following surgery for neoplasm: Secondary | ICD-10-CM | POA: Insufficient documentation

## 2024-09-04 NOTE — Therapy (Cosign Needed)
 OUTPATIENT PHYSICAL THERAPY BREAST CANCER POST OP FOLLOW UP   Patient Name: Rebecca Steele MRN: [id number] DOB:1951-03-18, 73 y.o., female Today's Date: 09/04/2024  END OF SESSION:  PT End of Session - 09/04/24 1500     Visit Number 2    Number of Visits 13    Date for Recertification  10/08/24    Authorization Type no auth    PT Start Time 1400    PT Stop Time 1456    PT Time Calculation (min) 56 min    Activity Tolerance Patient tolerated treatment well    Behavior During Therapy WFL for tasks assessed/performed           Past Medical History:  Diagnosis Date   Breast mass, right    Cancer (HCC) 10/2023   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   Sleep apnea    HAS MILD OSA, NO CPAP NEEDED   Past Surgical History:  Procedure Laterality Date   APPENDECTOMY     AXILLARY SENTINEL NODE BIOPSY Right 04/16/2024   Procedure: BIOPSY, LYMPH NODE, SENTINEL, AXILLARY;  Surgeon: Ebbie Cough, MD;  Location: [facility];  Service: General;  Laterality: Right;  RIGHT MASTY RIGHT AXILLARY SENTINEL NODE BIOPSY   BREAST BIOPSY Right 09/16/2022   MM RT BREAST BX W LOC DEV 1ST LESION IMAGE BX SPEC STEREO GUIDE 09/16/2022 [facility]   BREAST BIOPSY  12/06/2022   MM RT RADIOACTIVE SEED LOC MAMMO GUIDE 12/06/2022 [facility]   BREAST BIOPSY Left 11/16/2023   US  LT RADIOACTIVE SEED LOC 11/16/2023 [facility]   BREAST BIOPSY  11/16/2023   MM LT RADIOACTIVE SEED EA ADD LESION LOC MAMMO GUIDE 11/16/2023 [facility]   BREAST BIOPSY Right  04/01/2024   US  RT BREAST BX W LOC DEV 1ST LESION IMG BX SPEC US  GUIDE 04/01/2024 [facility]   BREAST EXCISIONAL BIOPSY Right 2018   BREAST LUMPECTOMY WITH RADIOACTIVE SEED AND SENTINEL LYMPH NODE BIOPSY Left 11/20/2023   Procedure: LEFT BREAST SEED GUIDED LUMPECTOMY, LEFT AXILLARY SENTINEL NODE BIOPSY;  Surgeon: Ebbie Cough, MD;  Location: [facility];  Service: General;  Laterality: Left;  PEC BLOCK   CHOLECYSTECTOMY     CYSTOSCOPY W/ RETROGRADES     KYPHOPLASTY N/A 10/05/2021   Procedure: LUMBAR ONE KYPHOPLASTY;  Surgeon: Cheryle Debby LABOR, MD;  Location: [facility];  Service: Neurosurgery;  Laterality: N/A;   MASS EXCISION Right 07/25/2024   Procedure: EXCISION, MASS, CHEST WALL;  Surgeon: Ebbie Cough, MD;  Location: [facility];  Service: General;  Laterality: Right;  EXCISION RIGHT MASTECTOMY WOUND   NECK SURGERY     PORTACATH PLACEMENT Right 10/12/2023   Procedure: INSERTION PORT-A-CATH WITH GUIDED ULTRASOUND;  Surgeon: Ebbie Cough, MD;  Location: [facility];  Service: General;  Laterality: Right;   RADIOACTIVE SEED GUIDED AXILLARY SENTINEL LYMPH NODE Left 11/20/2023   Procedure: LEFT AXILLARY NODE SEED GUIDED EXCISION;  Surgeon: Ebbie Cough, MD;  Location: [facility];  Service: General;  Laterality: Left;   RADIOACTIVE SEED GUIDED EXCISIONAL BREAST BIOPSY Right 12/12/2017   Procedure: RIGHT RADIOACTIVE SEED GUIDED EXCISIONAL BREAST BIOPSY ERAS PATHWAY;  Surgeon: Ebbie Cough, MD;  Location: [facility];  Service: General;  Laterality: Right;  LMA   RADIOACTIVE SEED GUIDED EXCISIONAL BREAST BIOPSY Right 12/07/2022   Procedure: RADIOACTIVE SEED GUIDED EXCISIONAL RIGHT BREAST BIOPSY;  Surgeon: Ebbie Cough, MD;  Location: [facility];  Service: General;  Laterality: Right;   SHOULDER SURGERY     SIMPLE MASTECTOMY WITH AXILLARY SENTINEL NODE BIOPSY Left 12/12/2023    Procedure: LEFT  MASTECTOMY;  Surgeon: Ebbie Cough, MD;  Location: [facility];  Service: General;  Laterality: Left;   TONSILLECTOMY     TOTAL HIP ARTHROPLASTY Left 06/04/2021   Procedure: TOTAL HIP ARTHROPLASTY ANTERIOR APPROACH;  Surgeon: Yvone Rush, MD;  Location: [facility];  Service: Orthopedics;  Laterality: Left;   TOTAL MASTECTOMY Right 04/16/2024   Procedure: MASTECTOMY, SIMPLE;  Surgeon: Ebbie Cough, MD;  Location: [facility];  Service: General;  Laterality: Right;   TRIGGER FINGER RELEASE Right 05/18/2015   Procedure: RIGHT  LONG FINGER TRIGGER RELEASE ;  Surgeon: Alm Hummer, MD;  Location: [facility];  Service: Orthopedics;  Laterality: Right;   TUBAL LIGATION     Patient Active Problem List   Diagnosis Date Noted   Malignant neoplasm of right breast (HCC) 05/09/2024   S/P mastectomy, right 04/16/2024   Intractable nausea and vomiting 04/01/2024   Dehydration 04/01/2024   Epigastric pain 04/01/2024   High anion gap metabolic acidosis 04/01/2024   Hypercalcemia 04/01/2024   Hyperbilirubinemia 04/01/2024   Hypophosphatemia 04/01/2024   Chronic diastolic CHF (congestive heart failure) (HCC) 04/01/2024   Acquired hypothyroidism 04/01/2024   Allergic rhinitis 04/01/2024   Depression 04/01/2024   Nausea & vomiting 03/10/2024   Chemotherapy induced nausea and vomiting 03/10/2024   S/P left mastectomy 12/12/2023   Breast cancer, left breast (HCC) 11/20/2023   Port-A-Cath in place 10/18/2023   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) 10/09/2023   Closed compression fracture of body of L1 vertebra (HCC) 10/05/2021   Compression fracture of L1 lumbar vertebra (HCC) 10/04/2021   Fall at home, initial encounter 10/04/2021   Hypokalemia 10/04/2021   Scalp laceration 10/04/2021   Leukocytosis 10/04/2021   Primary osteoarthritis of left hip 06/04/2021   Atypical ductal hyperplasia of right breast 03/19/2018    Obtundation 12/12/2017    PCP: Jon Kiang, MD  REFERRING PROVIDER: Marena Ebbie, MD  REFERRING DIAG: [phone] (ICD-10-CM) - Other specified postprocedural states C50.311 (ICD-10-CM) - Malignant neoplasm of lower-inner quadrant of right female breast Z17.0 (ICD-10-CM) - Estrogen receptor positive status (ER+) C50.912 (ICD-10-CM) - Malignant neoplasm of unspecified site of left female breast  THERAPY DIAG:  Malignant neoplasm of lower-inner quadrant of right breast of female, estrogen receptor positive (HCC)  Malignant neoplasm of upper-inner quadrant of left breast in female, estrogen receptor negative (HCC)  Aftercare following surgery for neoplasm  Stiffness of right shoulder, not elsewhere classified  Stiffness of left shoulder, not elsewhere classified  Abnormal posture  Rationale for Evaluation and Treatment: Rehabilitation  ONSET DATE: 04/16/24  SUBJECTIVE:  SUBJECTIVE STATEMENT: Pt reports she does not have any pain currently and that her HEP has been going okay at home. She thinks her incision site is looking a little better than it did during her evaluation. Her shoulder motion is doing better since she used her Rt arm to decorate her christmas tree. She has pain and tightness in the arm when lifting objects. Patient recently noticed a small knot a little below where her port was and is unsure if it is scar tissue or not.   Eval: Patient was referred due to shoulder ROM and strength deficits bilaterally. She reports her incision site is doing a lot better. Patient reports her pain has gotten up to a 7/10 at its worst, and a 3/10 at its best. The burning sensation goes all the way into her hand. The patient has a compression sleeve however it is so tight that it has been bruising her wrist.  Patient will wake up in the middle of the night with both arms feeling like they are asleep. She has been sleeping on the sofa since surgery as she cannot get comfortable in bed.   PERTINENT HISTORY:  Patient was diagnosed on 04/01/24 with right grade 2 IDC. It measured 2 cm and is located on the lower inner quadrant. Patient had a right mastectomy and sentinel node biopsy on 04/16/24. The mastectomy took 3 months to heal and underwent additional surgery to remove more tissue from the area about a month ago. Patient was diagnosed on 09/22/23 with left grade 3 IDC. It measures at least 5mm and is located in the upper inner quadrant. It is ER/PR neg, HER2 unknown with a Ki67 unknown with 1 metastatic lymph node. Other hx: Cervical fusion.   PATIENT GOALS:  Reassess how my recovery is going related to arm function, pain, and swelling.  PAIN:  Are you having pain? No  PRECAUTIONS: Recent Surgery, bilateral UE Lymphedema risk, hx of compression fracture of her vertebrae ~4 years ago  RED FLAGS: None   ACTIVITY LEVEL / LEISURE: Does music for church, plays the piano   OBJECTIVE:   PATIENT SURVEYS:  QUICK DASH: 56.82% on 08/27/24  OBSERVATIONS: - Chest skin mobile bilaterally - Incision on Rt side had multiple small, open spots; mild drainage observed   POSTURE:  Forward head and rounded shoulders  LYMPHEDEMA ASSESSMENT:   UPPER EXTREMITY AROM/PROM:   A/PROM RIGHT   eval   RIGHT 08/27/24  Shoulder extension 50 46  Shoulder flexion 145 109  Shoulder abduction 165 89 p!  Shoulder internal rotation 75   Shoulder external rotation 80                           (Blank rows = not tested)   A/PROM LEFT   eval LEFT  08/27/24  Shoulder extension 60 58  Shoulder flexion 150 140  Shoulder abduction 160 117 p!  Shoulder internal rotation 75 66  Shoulder external rotation 75 60                          (Blank rows = not tested)   CERVICAL AROM:     Percent limited EVAL  Flexion     Extension 50%  Right lateral flexion    Left lateral flexion    Right rotation 50%  Left rotation        UPPER EXTREMITY STRENGTH:     Eval:  5/5 Lt  5/5 Rt except for ER at 4/5   LYMPHEDEMA ASSESSMENTS (in cm):    LANDMARK RIGHT   eval RIGHT 08/27/24  10 cm proximal to olecranon process 32 28.2  Olecranon process 28 25.1  10 cm proximal to ulnar styloid process 20.9 19.9  Just proximal to ulnar styloid process 17.2 16.4  Across hand at thumb web space 19.3 18.8  At base of 2nd digit 6.6 6.5  (Blank rows = not tested)   LANDMARK LEFT   eval LEFT  08/27/24  10 cm proximal to olecranon process 31.8 28.5  Olecranon process 28.5 25.3  10 cm proximal to ulnar styloid process 21.4 20.1  Just proximal to ulnar styloid process 17.8 16.4  Across hand at thumb web space 19.5 18.3  At base of 2nd digit 6.6 6.8  (Blank rows = not tested)  TREATMENT DATE:  09/04/24: Therapeutic Exercise: Pulleys flex & abd 2 min each - VC on proper technique Table slides with ball flexion x 10 - did not feel a stretch with abd Ball rolls into abd x 10 bil  Supine shoulder flexion with dowel x 10 Supine pec stretch 2 x 30 sec hold  Manual Therapy: Rt shoulder PROM into flexion, abduction & D2 MFR to bilateral pectoralis and Rt UE including biceps with coco butter   PATIENT EDUCATION:  Education details: POC, HEP, not continuing SOZO Person educated: Patient Education method: Chief Technology Officer Education comprehension: verbalized understanding  HOME EXERCISE PROGRAM: Reviewed previously given post op HEP.  Access Code: [email or web address] URL: [email or web address] Date: 08/27/2024 Prepared by: Delon Pack  Exercises - Seated Shoulder Flexion Towel Slide at Table Top  - 1-2 x daily - 7 x weekly - 1 sets - 10 reps - 2-3 sec hold - Seated Shoulder Abduction Towel Slide at Table Top  - 1-2 x daily - 7 x weekly - 1 sets - 10 reps - 2-3 hold -As well as post op  sheet  ASSESSMENT:  CLINICAL IMPRESSION: Patient tolerates the addition of pulleys and wall abduction with no increases in pain or discomfort. The patient tolerates the addition of manual therapy with improved bilateral ROM and decreased fascial restrictions following. When performing passive shoulder ROM, a small black string, appearing to be part of a stitch is observed at the superior-most portion of the incision site. The patient also reports that she has recently noticed a small circular hard knot a few centimeters below the incision site where her porta cath is. The patient is advised to contact her doctor to ensure she does not need to be seen before her scheduled follow-up visit on 09/23/24. The patient is advised to continue the wall walks with her HEP since she no longer feels a stretch with the table slides and has improved shoulder AROM. The patient would benefit from continued skilled physical therapy to improve functional motion, improve strength, decrease pain, and improve overall QoL.     PT treatment/interventions: ADL/Self care home management, [phone]- PT Re-evaluation, 97110-Therapeutic exercises, 97530- Therapeutic activity, [id number]- Neuromuscular re-education, 97535- Self Care, [id number]- Manual therapy, Patient/Family education, Manual lymph drainage, Scar mobilization, Compression bandaging, and DME instructions   GOALS: Goals reviewed with patient? Yes  GOALS MET AT EVAL:  GOALS Name Target Date Goal status  1 Pt will be able to verbalize understanding of pertinent lymphedema risk reduction practices relevant to her dx specifically related to skin care.  Baseline:  No knowledge Eval Achieved at eval  2 Pt will be able to return demo and/or  verbalize understanding of the post op HEP related to regaining shoulder ROM. Baseline:  No knowledge Eval Achieved at eval  3 Pt will be able to verbalize understanding of the importance of viewing the post op After Breast CA Class video for  further lymphedema risk reduction education and therapeutic exercise.  Baseline:  No knowledge Eval Achieved at eval   LONG TERM GOALS:  (STG=LTG)  GOALS Name Target Date  Goal status  1 Pt will demonstrate she has regained full shoulder ROM and function post operatively compared to baselines.  Baseline: 10/09/23 INITIAL  2 Patient will be able to play the piano for 30 minutes with no pain in order to play during church service 10/09/23 INITIAL  3 Patient will improve bilateral shoulder AROM to be at least back to her baseline measurements for functional mobility 10/09/23 INITIAL  4 Patient will decrease her Quick-DASH form by at least 15 points to demonstrate functional improvement 10/09/23 INITIAL     PLAN:  PT FREQUENCY/DURATION: 2x week for 6 weeks  PLAN FOR NEXT SESSION: Shoulder PROM, table slides on Rt arm, supine dowel flexion, pec stretching   Randall Pack, SPT  09/04/2024, 4:32 PM   I agree with the following treatment note after reviewing documentation. This session was performed under the supervision of a licensed clinician.  Grayce Sheldon, PT 09/04/24, 4:32 PM

## 2024-09-10 ENCOUNTER — Ambulatory Visit: Admitting: Rehabilitation

## 2024-09-10 ENCOUNTER — Ambulatory Visit

## 2024-09-10 ENCOUNTER — Encounter: Payer: Self-pay | Admitting: Rehabilitation

## 2024-09-10 DIAGNOSIS — Z483 Aftercare following surgery for neoplasm: Secondary | ICD-10-CM

## 2024-09-10 DIAGNOSIS — M25611 Stiffness of right shoulder, not elsewhere classified: Secondary | ICD-10-CM

## 2024-09-10 DIAGNOSIS — M25612 Stiffness of left shoulder, not elsewhere classified: Secondary | ICD-10-CM

## 2024-09-10 DIAGNOSIS — C50311 Malignant neoplasm of lower-inner quadrant of right female breast: Secondary | ICD-10-CM | POA: Diagnosis not present

## 2024-09-10 DIAGNOSIS — C50212 Malignant neoplasm of upper-inner quadrant of left female breast: Secondary | ICD-10-CM

## 2024-09-10 DIAGNOSIS — R293 Abnormal posture: Secondary | ICD-10-CM

## 2024-09-10 NOTE — Therapy (Signed)
 OUTPATIENT PHYSICAL THERAPY BREAST CANCER POST OP FOLLOW UP   Patient Name: Rebecca Steele MRN: [id number] DOB:07/17/1951, 73 y.o., female Today's Date: 09/10/2024  END OF SESSION:  PT End of Session - 09/10/24 1554     Visit Number 3    Number of Visits 13    Date for Recertification  10/08/24    PT Start Time 1600    PT Stop Time 1651    PT Time Calculation (min) 51 min    Activity Tolerance Patient tolerated treatment well    Behavior During Therapy [facility] for tasks assessed/performed           Past Medical History:  Diagnosis Date   Breast mass, right    Cancer (HCC) 10/2023   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   Sleep apnea    HAS MILD OSA, NO CPAP NEEDED   Past Surgical History:  Procedure Laterality Date   APPENDECTOMY     AXILLARY SENTINEL NODE BIOPSY Right 04/16/2024   Procedure: BIOPSY, LYMPH NODE, SENTINEL, AXILLARY;  Surgeon: Ebbie Cough, MD;  Location: [facility];  Service: General;  Laterality: Right;  RIGHT MASTY RIGHT AXILLARY SENTINEL NODE BIOPSY   BREAST BIOPSY Right 09/16/2022   MM RT BREAST BX W LOC DEV 1ST LESION IMAGE BX SPEC STEREO GUIDE 09/16/2022 [facility]   BREAST BIOPSY  12/06/2022   MM RT RADIOACTIVE SEED LOC MAMMO GUIDE 12/06/2022 [facility]   BREAST BIOPSY Left 11/16/2023   US  LT RADIOACTIVE SEED LOC 11/16/2023 [facility]   BREAST BIOPSY  11/16/2023   MM LT RADIOACTIVE SEED EA ADD LESION LOC MAMMO GUIDE 11/16/2023 [facility]   BREAST BIOPSY Right 04/01/2024   US  RT BREAST BX W  LOC DEV 1ST LESION IMG BX SPEC US  GUIDE 04/01/2024 [facility]   BREAST EXCISIONAL BIOPSY Right 2018   BREAST LUMPECTOMY WITH RADIOACTIVE SEED AND SENTINEL LYMPH NODE BIOPSY Left 11/20/2023   Procedure: LEFT BREAST SEED GUIDED LUMPECTOMY, LEFT AXILLARY SENTINEL NODE BIOPSY;  Surgeon: Ebbie Cough, MD;  Location: [facility];  Service: General;  Laterality: Left;  PEC BLOCK   CHOLECYSTECTOMY     CYSTOSCOPY W/ RETROGRADES     KYPHOPLASTY N/A 10/05/2021   Procedure: LUMBAR ONE KYPHOPLASTY;  Surgeon: Cheryle Debby LABOR, MD;  Location: [facility];  Service: Neurosurgery;  Laterality: N/A;   MASS EXCISION Right 07/25/2024   Procedure: EXCISION, MASS, CHEST WALL;  Surgeon: Ebbie Cough, MD;  Location: [facility];  Service: General;  Laterality: Right;  EXCISION RIGHT MASTECTOMY WOUND   NECK SURGERY     PORTACATH PLACEMENT Right 10/12/2023   Procedure: INSERTION PORT-A-CATH WITH GUIDED ULTRASOUND;  Surgeon: Ebbie Cough, MD;  Location: [facility];  Service: General;  Laterality: Right;   RADIOACTIVE SEED GUIDED AXILLARY SENTINEL LYMPH NODE Left 11/20/2023   Procedure: LEFT AXILLARY NODE SEED GUIDED EXCISION;  Surgeon: Ebbie Cough, MD;  Location: [facility];  Service: General;  Laterality:  Left;   RADIOACTIVE SEED GUIDED EXCISIONAL BREAST BIOPSY Right 12/12/2017   Procedure: RIGHT RADIOACTIVE SEED GUIDED EXCISIONAL BREAST BIOPSY ERAS PATHWAY;  Surgeon: Ebbie Cough, MD;  Location: [facility];  Service: General;  Laterality: Right;  LMA   RADIOACTIVE SEED GUIDED EXCISIONAL BREAST BIOPSY Right 12/07/2022   Procedure: RADIOACTIVE SEED GUIDED EXCISIONAL RIGHT BREAST BIOPSY;  Surgeon: Ebbie Cough, MD;  Location: [facility];  Service: General;  Laterality: Right;   SHOULDER SURGERY     SIMPLE MASTECTOMY WITH AXILLARY SENTINEL NODE BIOPSY Left 12/12/2023   Procedure: LEFT  MASTECTOMY;   Surgeon: Ebbie Cough, MD;  Location: [facility];  Service: General;  Laterality: Left;   TONSILLECTOMY     TOTAL HIP ARTHROPLASTY Left 06/04/2021   Procedure: TOTAL HIP ARTHROPLASTY ANTERIOR APPROACH;  Surgeon: Yvone Rush, MD;  Location: [facility];  Service: Orthopedics;  Laterality: Left;   TOTAL MASTECTOMY Right 04/16/2024   Procedure: MASTECTOMY, SIMPLE;  Surgeon: Ebbie Cough, MD;  Location: [facility];  Service: General;  Laterality: Right;   TRIGGER FINGER RELEASE Right 05/18/2015   Procedure: RIGHT  LONG FINGER TRIGGER RELEASE ;  Surgeon: Alm Hummer, MD;  Location: [facility];  Service: Orthopedics;  Laterality: Right;   TUBAL LIGATION     Patient Active Problem List   Diagnosis Date Noted   Malignant neoplasm of right breast (HCC) 05/09/2024   S/P mastectomy, right 04/16/2024   Intractable nausea and vomiting 04/01/2024   Dehydration 04/01/2024   Epigastric pain 04/01/2024   High anion gap metabolic acidosis 04/01/2024   Hypercalcemia 04/01/2024   Hyperbilirubinemia 04/01/2024   Hypophosphatemia 04/01/2024   Chronic diastolic CHF (congestive heart failure) (HCC) 04/01/2024   Acquired hypothyroidism 04/01/2024   Allergic rhinitis 04/01/2024   Depression 04/01/2024   Nausea & vomiting 03/10/2024   Chemotherapy induced nausea and vomiting 03/10/2024   S/P left mastectomy 12/12/2023   Breast cancer, left breast (HCC) 11/20/2023   Port-A-Cath in place 10/18/2023   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) 10/09/2023   Closed compression fracture of body of L1 vertebra (HCC) 10/05/2021   Compression fracture of L1 lumbar vertebra (HCC) 10/04/2021   Fall at home, initial encounter 10/04/2021   Hypokalemia 10/04/2021   Scalp laceration 10/04/2021   Leukocytosis 10/04/2021   Primary osteoarthritis of left hip 06/04/2021   Atypical ductal hyperplasia of right breast 03/19/2018   Obtundation 12/12/2017    PCP:  Jon Kiang, MD  REFERRING PROVIDER: Marena Ebbie, MD  REFERRING DIAG: [phone] (ICD-10-CM) - Other specified postprocedural states C50.311 (ICD-10-CM) - Malignant neoplasm of lower-inner quadrant of right female breast Z17.0 (ICD-10-CM) - Estrogen receptor positive status (ER+) C50.912 (ICD-10-CM) - Malignant neoplasm of unspecified site of left female breast  THERAPY DIAG:  Malignant neoplasm of lower-inner quadrant of right breast of female, estrogen receptor positive (HCC)  Malignant neoplasm of upper-inner quadrant of left breast in female, estrogen receptor negative (HCC)  Aftercare following surgery for neoplasm  Stiffness of right shoulder, not elsewhere classified  Stiffness of left shoulder, not elsewhere classified  Abnormal posture  Rationale for Evaluation and Treatment: Rehabilitation  ONSET DATE: 04/16/24  SUBJECTIVE:  SUBJECTIVE STATEMENT: It still feels tight.  The burning in the arm is more of the issue.  Just the Rt arm.    Eval: Patient was referred due to shoulder ROM and strength deficits bilaterally. She reports her incision site is doing a lot better. Patient reports her pain has gotten up to a 7/10 at its worst, and a 3/10 at its best. The burning sensation goes all the way into her hand. The patient has a compression sleeve however it is so tight that it has been bruising her wrist. Patient will wake up in the middle of the night with both arms feeling like they are asleep. She has been sleeping on the sofa since surgery as she cannot get comfortable in bed.   PERTINENT HISTORY:  Patient was diagnosed on 04/01/24 with right grade 2 IDC. It measured 2 cm and is located on the lower inner quadrant. Patient had a right mastectomy and sentinel node biopsy on 04/16/24. The  mastectomy took 3 months to heal and underwent additional surgery to remove more tissue from the area about a month ago. Patient was diagnosed on 09/22/23 with left grade 3 IDC. It measures at least 5mm and is located in the upper inner quadrant. It is ER/PR neg, HER2 unknown with a Ki67 unknown with 1 metastatic lymph node. Other hx: Cervical fusion.   PATIENT GOALS:  Reassess how my recovery is going related to arm function, pain, and swelling.  PAIN:  Are you having pain? No  PRECAUTIONS: Recent Surgery, bilateral UE Lymphedema risk, hx of compression fracture of her vertebrae ~4 years ago  RED FLAGS: None   ACTIVITY LEVEL / LEISURE: Does music for church, plays the piano   OBJECTIVE:   PATIENT SURVEYS:  QUICK DASH: 56.82% on 08/27/24  OBSERVATIONS: - Chest skin mobile bilaterally - Incision on Rt side had multiple small, open spots; mild drainage observed   POSTURE:  Forward head and rounded shoulders  LYMPHEDEMA ASSESSMENT:   UPPER EXTREMITY AROM/PROM:   A/PROM RIGHT   eval   RIGHT 08/27/24  Shoulder extension 50 46  Shoulder flexion 145 109  Shoulder abduction 165 89 p!  Shoulder internal rotation 75   Shoulder external rotation 80                           (Blank rows = not tested)   A/PROM LEFT   eval LEFT  08/27/24  Shoulder extension 60 58  Shoulder flexion 150 140  Shoulder abduction 160 117 p!  Shoulder internal rotation 75 66  Shoulder external rotation 75 60                          (Blank rows = not tested)   CERVICAL AROM:     Percent limited EVAL  Flexion    Extension 50%  Right lateral flexion    Left lateral flexion    Right rotation 50%  Left rotation        UPPER EXTREMITY STRENGTH:     Eval:  5/5 Lt   5/5 Rt except for ER at 4/5   LYMPHEDEMA ASSESSMENTS (in cm):    LANDMARK RIGHT   eval RIGHT 08/27/24  10 cm proximal to olecranon process 32 28.2  Olecranon process 28 25.1  10 cm proximal to ulnar styloid process 20.9  19.9  Just proximal to ulnar styloid process 17.2 16.4  Across hand at thumb web space  19.3 18.8  At base of 2nd digit 6.6 6.5  (Blank rows = not tested)   LANDMARK LEFT   eval LEFT  08/27/24  10 cm proximal to olecranon process 31.8 28.5  Olecranon process 28.5 25.3  10 cm proximal to ulnar styloid process 21.4 20.1  Just proximal to ulnar styloid process 17.8 16.4  Across hand at thumb web space 19.5 18.3  At base of 2nd digit 6.6 6.8  (Blank rows = not tested)  TREATMENT DATE:  09/10/24: Therapeutic Exercise: Pulleys flex & abd 2 min each - VC on proper technique Ball rolls into flexion and abd x 10 bil  Supine shoulder flexion with dowel x 10 with knees bent Snow angel position chest stretch 3x20 Alternating T x 5 bil  Manual Therapy: PROM into flexion, abduction & D2 bil with more pinning of cord at axilla on the Rt, nothing on the Lt MFR to cording in Rt arm - edu throughout on what cording is and how it causes some of the forearm pain.   09/04/24: Therapeutic Exercise: Pulleys flex & abd 2 min each - VC on proper technique Table slides with ball flexion x 10 - did not feel a stretch with abd Ball rolls into abd x 10 bil  Supine shoulder flexion with dowel x 10 Supine pec stretch 2 x 30 sec hold  Manual Therapy: Rt shoulder PROM into flexion, abduction & D2 MFR to bilateral pectoralis and Rt UE including biceps with coco butter   PATIENT EDUCATION:  Education details: POC, HEP, not continuing SOZO Person educated: Patient Education method: Chief Technology Officer Education comprehension: verbalized understanding  HOME EXERCISE PROGRAM: Reviewed previously given post op HEP.  Access Code: [email or web address] URL: [email or web address] Date: 08/27/2024 Prepared by: Delon Pack  Exercises - Seated Shoulder Flexion Towel Slide at Table Top  - 1-2 x daily - 7 x weekly - 1 sets - 10 reps - 2-3 sec hold - Seated Shoulder Abduction Towel Slide at Table Top   - 1-2 x daily - 7 x weekly - 1 sets - 10 reps - 2-3 hold -As well as post op sheet  ASSESSMENT:  CLINICAL IMPRESSION:   Pt is doing well.  She is feeling improvements in the Rt arm but does have cording related pain and tightness at the elbow and upper arm.    PT treatment/interventions: ADL/Self care home management, [phone]- PT Re-evaluation, 97110-Therapeutic exercises, 97530- Therapeutic activity, [id number]- Neuromuscular re-education, 97535- Self Care, [id number]- Manual therapy, Patient/Family education, Manual lymph drainage, Scar mobilization, Compression bandaging, and DME instructions   GOALS: Goals reviewed with patient? Yes  GOALS MET AT EVAL:  GOALS Name Target Date Goal status  1 Pt will be able to verbalize understanding of pertinent lymphedema risk reduction practices relevant to her dx specifically related to skin care.  Baseline:  No knowledge Eval Achieved at eval  2 Pt will be able to return demo and/or verbalize understanding of the post op HEP related to regaining shoulder ROM. Baseline:  No knowledge Eval Achieved at eval  3 Pt will be able to verbalize understanding of the importance of viewing the post op After Breast CA Class video for further lymphedema risk reduction education and therapeutic exercise.  Baseline:  No knowledge Eval Achieved at eval   LONG TERM GOALS:  (STG=LTG)  GOALS Name Target Date  Goal status  1 Pt will demonstrate she has regained full shoulder ROM and function post operatively compared to baselines.  Baseline: 10/09/23 INITIAL  2  Patient will be able to play the piano for 30 minutes with no pain in order to play during church service 10/09/23 INITIAL  3 Patient will improve bilateral shoulder AROM to be at least back to her baseline measurements for functional mobility 10/09/23 INITIAL  4 Patient will decrease her Quick-DASH form by at least 15 points to demonstrate functional improvement 10/09/23 INITIAL     PLAN:  PT FREQUENCY/DURATION: 2x week  for 6 weeks  PLAN FOR NEXT SESSION: Shoulder PROM bil, cording work Rt arm  Saddie Raw, PT 09/10/24, 5:02 PM

## 2024-09-12 ENCOUNTER — Ambulatory Visit

## 2024-09-12 DIAGNOSIS — C50311 Malignant neoplasm of lower-inner quadrant of right female breast: Secondary | ICD-10-CM | POA: Diagnosis not present

## 2024-09-12 DIAGNOSIS — M25612 Stiffness of left shoulder, not elsewhere classified: Secondary | ICD-10-CM

## 2024-09-12 DIAGNOSIS — R293 Abnormal posture: Secondary | ICD-10-CM

## 2024-09-12 DIAGNOSIS — C50212 Malignant neoplasm of upper-inner quadrant of left female breast: Secondary | ICD-10-CM

## 2024-09-12 DIAGNOSIS — Z483 Aftercare following surgery for neoplasm: Secondary | ICD-10-CM

## 2024-09-12 DIAGNOSIS — M25611 Stiffness of right shoulder, not elsewhere classified: Secondary | ICD-10-CM

## 2024-09-12 NOTE — Therapy (Signed)
 OUTPATIENT PHYSICAL THERAPY BREAST CANCER POST OP FOLLOW UP   Patient Name: Rebecca Steele MRN: [id number] DOB:October 09, 1950, 73 y.o., female Today's Date: 09/12/2024  END OF SESSION:  PT End of Session - 09/12/24 1056     Visit Number 4    Number of Visits 13    Date for Recertification  10/08/24    Authorization Type no auth    PT Start Time 1100    PT Stop Time 1154    PT Time Calculation (min) 54 min    Activity Tolerance Patient tolerated treatment well    Behavior During Therapy WFL for tasks assessed/performed           Past Medical History:  Diagnosis Date   Breast mass, right    Cancer (HCC) 10/2023   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   Sleep apnea    HAS MILD OSA, NO CPAP NEEDED   Past Surgical History:  Procedure Laterality Date   APPENDECTOMY     AXILLARY SENTINEL NODE BIOPSY Right 04/16/2024   Procedure: BIOPSY, LYMPH NODE, SENTINEL, AXILLARY;  Surgeon: Ebbie Cough, MD;  Location: [facility];  Service: General;  Laterality: Right;  RIGHT MASTY RIGHT AXILLARY SENTINEL NODE BIOPSY   BREAST BIOPSY Right 09/16/2022   MM RT BREAST BX W LOC DEV 1ST LESION IMAGE BX SPEC STEREO GUIDE 09/16/2022 [facility]   BREAST BIOPSY  12/06/2022   MM RT RADIOACTIVE SEED LOC MAMMO GUIDE 12/06/2022 [facility]   BREAST BIOPSY Left 11/16/2023   US  LT RADIOACTIVE SEED LOC 11/16/2023 [facility]   BREAST BIOPSY  11/16/2023   MM LT RADIOACTIVE SEED EA ADD LESION LOC MAMMO GUIDE 11/16/2023 [facility]   BREAST BIOPSY Right  04/01/2024   US  RT BREAST BX W LOC DEV 1ST LESION IMG BX SPEC US  GUIDE 04/01/2024 [facility]   BREAST EXCISIONAL BIOPSY Right 2018   BREAST LUMPECTOMY WITH RADIOACTIVE SEED AND SENTINEL LYMPH NODE BIOPSY Left 11/20/2023   Procedure: LEFT BREAST SEED GUIDED LUMPECTOMY, LEFT AXILLARY SENTINEL NODE BIOPSY;  Surgeon: Ebbie Cough, MD;  Location: [facility];  Service: General;  Laterality: Left;  PEC BLOCK   CHOLECYSTECTOMY     CYSTOSCOPY W/ RETROGRADES     KYPHOPLASTY N/A 10/05/2021   Procedure: LUMBAR ONE KYPHOPLASTY;  Surgeon: Cheryle Debby LABOR, MD;  Location: [facility];  Service: Neurosurgery;  Laterality: N/A;   MASS EXCISION Right 07/25/2024   Procedure: EXCISION, MASS, CHEST WALL;  Surgeon: Ebbie Cough, MD;  Location: [facility];  Service: General;  Laterality: Right;  EXCISION RIGHT MASTECTOMY WOUND   NECK SURGERY     PORTACATH PLACEMENT Right 10/12/2023   Procedure: INSERTION PORT-A-CATH WITH GUIDED ULTRASOUND;  Surgeon: Ebbie Cough, MD;  Location: [facility];  Service: General;  Laterality: Right;   RADIOACTIVE SEED GUIDED AXILLARY SENTINEL LYMPH NODE Left 11/20/2023   Procedure: LEFT AXILLARY NODE SEED GUIDED EXCISION;  Surgeon: Ebbie Cough, MD;  Location:   SURGERY CENTER;  Service: General;  Laterality: Left;   RADIOACTIVE SEED GUIDED EXCISIONAL BREAST BIOPSY Right 12/12/2017   Procedure: RIGHT RADIOACTIVE SEED GUIDED EXCISIONAL BREAST BIOPSY ERAS PATHWAY;  Surgeon: Ebbie Cough, MD;  Location: [facility];  Service: General;  Laterality: Right;  LMA   RADIOACTIVE SEED GUIDED EXCISIONAL BREAST BIOPSY Right 12/07/2022   Procedure: RADIOACTIVE SEED GUIDED EXCISIONAL RIGHT BREAST BIOPSY;  Surgeon: Ebbie Cough, MD;  Location: [facility];  Service: General;  Laterality: Right;   SHOULDER SURGERY     SIMPLE MASTECTOMY WITH AXILLARY SENTINEL NODE BIOPSY Left 12/12/2023    Procedure: LEFT  MASTECTOMY;  Surgeon: Ebbie Cough, MD;  Location: [facility];  Service: General;  Laterality: Left;   TONSILLECTOMY     TOTAL HIP ARTHROPLASTY Left 06/04/2021   Procedure: TOTAL HIP ARTHROPLASTY ANTERIOR APPROACH;  Surgeon: Yvone Rush, MD;  Location: [facility];  Service: Orthopedics;  Laterality: Left;   TOTAL MASTECTOMY Right 04/16/2024   Procedure: MASTECTOMY, SIMPLE;  Surgeon: Ebbie Cough, MD;  Location: [facility];  Service: General;  Laterality: Right;   TRIGGER FINGER RELEASE Right 05/18/2015   Procedure: RIGHT  LONG FINGER TRIGGER RELEASE ;  Surgeon: Alm Hummer, MD;  Location: [facility];  Service: Orthopedics;  Laterality: Right;   TUBAL LIGATION     Patient Active Problem List   Diagnosis Date Noted   Malignant neoplasm of right breast (HCC) 05/09/2024   S/P mastectomy, right 04/16/2024   Intractable nausea and vomiting 04/01/2024   Dehydration 04/01/2024   Epigastric pain 04/01/2024   High anion gap metabolic acidosis 04/01/2024   Hypercalcemia 04/01/2024   Hyperbilirubinemia 04/01/2024   Hypophosphatemia 04/01/2024   Chronic diastolic CHF (congestive heart failure) (HCC) 04/01/2024   Acquired hypothyroidism 04/01/2024   Allergic rhinitis 04/01/2024   Depression 04/01/2024   Nausea & vomiting 03/10/2024   Chemotherapy induced nausea and vomiting 03/10/2024   S/P left mastectomy 12/12/2023   Breast cancer, left breast (HCC) 11/20/2023   Port-A-Cath in place 10/18/2023   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) 10/09/2023   Closed compression fracture of body of L1 vertebra (HCC) 10/05/2021   Compression fracture of L1 lumbar vertebra (HCC) 10/04/2021   Fall at home, initial encounter 10/04/2021   Hypokalemia 10/04/2021   Scalp laceration 10/04/2021   Leukocytosis 10/04/2021   Primary osteoarthritis of left hip 06/04/2021   Atypical ductal hyperplasia of right breast 03/19/2018    Obtundation 12/12/2017    PCP: Jon Kiang, MD  REFERRING PROVIDER: Marena Ebbie, MD  REFERRING DIAG: [phone] (ICD-10-CM) - Other specified postprocedural states C50.311 (ICD-10-CM) - Malignant neoplasm of lower-inner quadrant of right female breast Z17.0 (ICD-10-CM) - Estrogen receptor positive status (ER+) C50.912 (ICD-10-CM) - Malignant neoplasm of unspecified site of left female breast  THERAPY DIAG:  Malignant neoplasm of lower-inner quadrant of right breast of female, estrogen receptor positive (HCC)  Malignant neoplasm of upper-inner quadrant of left breast in female, estrogen receptor negative (HCC)  Aftercare following surgery for neoplasm  Stiffness of right shoulder, not elsewhere classified  Stiffness of left shoulder, not elsewhere classified  Abnormal posture  Rationale for Evaluation and Treatment: Rehabilitation  ONSET DATE: 04/16/24  SUBJECTIVE:  SUBJECTIVE STATEMENT: I think ROM is better. I have a really stiff neck on the left side. It started on Tuesday with turning left and is sharp at times. It feels muscular. I have had 2 prior neck surgeries but this feels muscular.  Eval: Patient was referred due to shoulder ROM and strength deficits bilaterally. She reports her incision site is doing a lot better. Patient reports her pain has gotten up to a 7/10 at its worst, and a 3/10 at its best. The burning sensation goes all the way into her hand. The patient has a compression sleeve however it is so tight that it has been bruising her wrist. Patient will wake up in the middle of the night with both arms feeling like they are asleep. She has been sleeping on the sofa since surgery as she cannot get comfortable in bed.   PERTINENT HISTORY:  Patient was diagnosed on 04/01/24 with right  grade 2 IDC. It measured 2 cm and is located on the lower inner quadrant. Patient had a right mastectomy and sentinel node biopsy on 04/16/24. The mastectomy took 3 months to heal and underwent additional surgery to remove more tissue from the area about a month ago. Patient was diagnosed on 09/22/23 with left grade 3 IDC. It measures at least 5mm and is located in the upper inner quadrant. It is ER/PR neg, HER2 unknown with a Ki67 unknown with 1 metastatic lymph node. Other hx: Cervical fusion.   PATIENT GOALS:  Reassess how my recovery is going related to arm function, pain, and swelling.  PAIN:  Are you having pain?PAIN:  Are you having pain? Yes NPRS scale: 0/10 best/10, 9/10 Pain location: left neck, left arm feels tight Pain orientation: Left  PAIN TYPE: sharp and tight Pain description: intermittent  Aggravating factors: turning to the left Relieving factors: hot shower, moist heat   PRECAUTIONS: Recent Surgery, bilateral UE Lymphedema risk, hx of compression fracture of her vertebrae ~4 years ago  RED FLAGS: None   ACTIVITY LEVEL / LEISURE: Does music for church, plays the piano   OBJECTIVE:   PATIENT SURVEYS:  QUICK DASH: 56.82% on 08/27/24  OBSERVATIONS: - Chest skin mobile bilaterally - Incision on Rt side had multiple small, open spots; mild drainage observed   POSTURE:  Forward head and rounded shoulders  LYMPHEDEMA ASSESSMENT:   UPPER EXTREMITY AROM/PROM:   A/PROM RIGHT   eval   RIGHT 08/27/24  Shoulder extension 50 46  Shoulder flexion 145 109  Shoulder abduction 165 89 p!  Shoulder internal rotation 75   Shoulder external rotation 80                           (Blank rows = not tested)   A/PROM LEFT   eval LEFT  08/27/24  Shoulder extension 60 58  Shoulder flexion 150 140  Shoulder abduction 160 117 p!  Shoulder internal rotation 75 66  Shoulder external rotation 75 60                          (Blank rows = not tested)   CERVICAL AROM:      Percent limited EVAL  Flexion    Extension 50%  Right lateral flexion    Left lateral flexion    Right rotation 50%  Left rotation        UPPER EXTREMITY STRENGTH:     Eval:  5/5 Lt  5/5 Rt except for ER at 4/5   LYMPHEDEMA ASSESSMENTS (in cm):    LANDMARK RIGHT   eval RIGHT 08/27/24  10 cm proximal to olecranon process 32 28.2  Olecranon process 28 25.1  10 cm proximal to ulnar styloid process 20.9 19.9  Just proximal to ulnar styloid process 17.2 16.4  Across hand at thumb web space 19.3 18.8  At base of 2nd digit 6.6 6.5  (Blank rows = not tested)   LANDMARK LEFT   eval LEFT  08/27/24  10 cm proximal to olecranon process 31.8 28.5  Olecranon process 28.5 25.3  10 cm proximal to ulnar styloid process 21.4 20.1  Just proximal to ulnar styloid process 17.8 16.4  Across hand at thumb web space 19.5 18.3  At base of 2nd digit 6.6 6.8  (Blank rows = not tested)  TREATMENT DATE:   09/12/2024 Supine wand flexion and scaption x 5 3 D AROM bilateral shoulders flexion, scaption, horizontal abd x 5 Soft tissue mobilization bilateral UT/posterior cervicals with cocoa butter with emphais on left MFR to right axilla and UE cording, and gently to right chest area PROM right shoulder flexion, scaption, abd, Er  09/10/24: Therapeutic Exercise: Pulleys flex & abd 2 min each - VC on proper technique Ball rolls into flexion and abd x 10 bil  Supine shoulder flexion with dowel x 10 with knees bent Snow angel position chest stretch 3x20 Alternating T x 5 bil  Manual Therapy: PROM into flexion, abduction & D2 bil with more pinning of cord at axilla on the Rt, nothing on the Lt MFR to cording in Rt arm - edu throughout on what cording is and how it causes some of the forearm pain.   09/04/24: Therapeutic Exercise: Pulleys flex & abd 2 min each - VC on proper technique Table slides with ball flexion x 10 - did not feel a stretch with abd Ball rolls into abd x 10 bil  Supine  shoulder flexion with dowel x 10 Supine pec stretch 2 x 30 sec hold  Manual Therapy: Rt shoulder PROM into flexion, abduction & D2 MFR to bilateral pectoralis and Rt UE including biceps with coco butter   PATIENT EDUCATION:  Education details: POC, HEP, not continuing SOZO Person educated: Patient Education method: Chief Technology Officer Education comprehension: verbalized understanding  HOME EXERCISE PROGRAM: Reviewed previously given post op HEP.  Access Code: [email or web address] URL: [email or web address] Date: 08/27/2024 Prepared by: Delon Pack  Exercises - Seated Shoulder Flexion Towel Slide at Table Top  - 1-2 x daily - 7 x weekly - 1 sets - 10 reps - 2-3 sec hold - Seated Shoulder Abduction Towel Slide at Table Top  - 1-2 x daily - 7 x weekly - 1 sets - 10 reps - 2-3 hold -As well as post op sheet  ASSESSMENT:  CLINICAL IMPRESSION:   Pt with new onset of left neck pain; less sharpness after manual work today but still felt tight. Advised to see MD if no improvement. Right pec tendon and cords still very tight and limiting ROM especially with abduction. Pt has not had a response to message sent to MD last week.    PT treatment/interventions: ADL/Self care home management, [phone]- PT Re-evaluation, 97110-Therapeutic exercises, 97530- Therapeutic activity, [id number]- Neuromuscular re-education, 97535- Self Care, [id number]- Manual therapy, Patient/Family education, Manual lymph drainage, Scar mobilization, Compression bandaging, and DME instructions   GOALS: Goals reviewed with patient? Yes  GOALS MET AT EVAL:  GOALS Name Target Date Goal status  1 Pt will be able to verbalize understanding of pertinent lymphedema risk reduction practices relevant to her dx specifically related to skin care.  Baseline:  No knowledge Eval Achieved at eval  2 Pt will be able to return demo and/or verbalize understanding of the post op HEP related to regaining shoulder ROM. Baseline:  No  knowledge Eval Achieved at eval  3 Pt will be able to verbalize understanding of the importance of viewing the post op After Breast CA Class video for further lymphedema risk reduction education and therapeutic exercise.  Baseline:  No knowledge Eval Achieved at eval   LONG TERM GOALS:  (STG=LTG)  GOALS Name Target Date  Goal status  1 Pt will demonstrate she has regained full shoulder ROM and function post operatively compared to baselines.  Baseline: 10/09/23 INITIAL  2 Patient will be able to play the piano for 30 minutes with no pain in order to play during church service 10/09/23 INITIAL  3 Patient will improve bilateral shoulder AROM to be at least back to her baseline measurements for functional mobility 10/09/23 INITIAL  4 Patient will decrease her Quick-DASH form by at least 15 points to demonstrate functional improvement 10/09/23 INITIAL     PLAN:  PT FREQUENCY/DURATION: 2x week for 6 weeks  PLAN FOR NEXT SESSION: Shoulder PROM bil, cording work Rt arm   Grayce Sheldon, PT 09/12/2024 11:54 AM

## 2024-09-17 ENCOUNTER — Encounter: Payer: Self-pay | Admitting: Rehabilitation

## 2024-09-17 ENCOUNTER — Ambulatory Visit: Admitting: Rehabilitation

## 2024-09-17 DIAGNOSIS — M25612 Stiffness of left shoulder, not elsewhere classified: Secondary | ICD-10-CM

## 2024-09-17 DIAGNOSIS — R293 Abnormal posture: Secondary | ICD-10-CM

## 2024-09-17 DIAGNOSIS — C50311 Malignant neoplasm of lower-inner quadrant of right female breast: Secondary | ICD-10-CM | POA: Diagnosis not present

## 2024-09-17 DIAGNOSIS — M25611 Stiffness of right shoulder, not elsewhere classified: Secondary | ICD-10-CM

## 2024-09-17 DIAGNOSIS — C50212 Malignant neoplasm of upper-inner quadrant of left female breast: Secondary | ICD-10-CM

## 2024-09-17 DIAGNOSIS — Z483 Aftercare following surgery for neoplasm: Secondary | ICD-10-CM

## 2024-09-17 NOTE — Therapy (Signed)
 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   Sleep apnea    HAS MILD OSA, NO CPAP NEEDED   Past Surgical History:  Procedure Laterality Date   APPENDECTOMY     AXILLARY SENTINEL NODE BIOPSY Right 04/16/2024   Procedure: BIOPSY, LYMPH NODE, SENTINEL, AXILLARY;  Surgeon: Ebbie Cough, MD;  Location: [facility];  Service: General;  Laterality: Right;  RIGHT MASTY RIGHT AXILLARY SENTINEL NODE BIOPSY   BREAST BIOPSY Right 09/16/2022   MM RT BREAST BX W LOC DEV 1ST LESION IMAGE BX SPEC STEREO GUIDE 09/16/2022 [facility]   BREAST BIOPSY  12/06/2022   MM RT RADIOACTIVE SEED LOC MAMMO GUIDE 12/06/2022 [facility]   BREAST BIOPSY Left 11/16/2023   US  LT RADIOACTIVE SEED LOC 11/16/2023 [facility]   BREAST BIOPSY  11/16/2023   MM LT RADIOACTIVE SEED EA ADD LESION LOC MAMMO GUIDE 11/16/2023 [facility]   BREAST BIOPSY Right 04/01/2024   US  RT BREAST BX W  LOC DEV 1ST LESION IMG BX SPEC US  GUIDE 04/01/2024 [facility]   BREAST EXCISIONAL BIOPSY Right 2018   BREAST LUMPECTOMY WITH RADIOACTIVE SEED AND SENTINEL LYMPH NODE BIOPSY Left 11/20/2023   Procedure: LEFT BREAST SEED GUIDED LUMPECTOMY, LEFT AXILLARY SENTINEL NODE BIOPSY;  Surgeon: Ebbie Cough, MD;  Location: [facility];  Service: General;  Laterality: Left;  PEC BLOCK   CHOLECYSTECTOMY     CYSTOSCOPY W/ RETROGRADES     KYPHOPLASTY N/A 10/05/2021   Procedure: LUMBAR ONE KYPHOPLASTY;  Surgeon: Cheryle Debby LABOR, MD;  Location: [facility];  Service: Neurosurgery;  Laterality: N/A;   MASS EXCISION Right 07/25/2024   Procedure: EXCISION, MASS, CHEST WALL;  Surgeon: Ebbie Cough, MD;  Location: [facility];  Service: General;  Laterality: Right;  EXCISION RIGHT MASTECTOMY WOUND   NECK SURGERY     PORTACATH PLACEMENT Right 10/12/2023   Procedure: INSERTION PORT-A-CATH WITH GUIDED ULTRASOUND;  Surgeon: Ebbie Cough, MD;  Location: [facility];  Service: General;  Laterality: Right;   RADIOACTIVE SEED GUIDED AXILLARY SENTINEL LYMPH NODE Left 11/20/2023   Procedure: LEFT AXILLARY NODE SEED GUIDED EXCISION;  Surgeon: Ebbie Cough, MD;  Location: [facility];  Service: General;  Laterality:  Left;   RADIOACTIVE SEED GUIDED EXCISIONAL BREAST BIOPSY Right 12/12/2017   Procedure: RIGHT RADIOACTIVE SEED GUIDED EXCISIONAL BREAST BIOPSY ERAS PATHWAY;  Surgeon: Ebbie Cough, MD;  Location: [facility];  Service: General;  Laterality: Right;  LMA   RADIOACTIVE SEED GUIDED EXCISIONAL BREAST BIOPSY Right 12/07/2022   Procedure: RADIOACTIVE SEED GUIDED EXCISIONAL RIGHT BREAST BIOPSY;  Surgeon: Ebbie Cough, MD;  Location: [facility];  Service: General;  Laterality: Right;   SHOULDER SURGERY     SIMPLE MASTECTOMY WITH AXILLARY SENTINEL NODE BIOPSY Left 12/12/2023   Procedure: LEFT  MASTECTOMY;   Surgeon: Ebbie Cough, MD;  Location: [facility];  Service: General;  Laterality: Left;   TONSILLECTOMY     TOTAL HIP ARTHROPLASTY Left 06/04/2021   Procedure: TOTAL HIP ARTHROPLASTY ANTERIOR APPROACH;  Surgeon: Yvone Rush, MD;  Location: [facility];  Service: Orthopedics;  Laterality: Left;   TOTAL MASTECTOMY Right 04/16/2024   Procedure: MASTECTOMY, SIMPLE;  Surgeon: Ebbie Cough, MD;  Location: [facility];  Service: General;  Laterality: Right;   TRIGGER FINGER RELEASE Right 05/18/2015   Procedure: RIGHT  LONG FINGER TRIGGER RELEASE ;  Surgeon: Alm Hummer, MD;  Location: [facility];  Service: Orthopedics;  Laterality: Right;   TUBAL LIGATION     Patient Active Problem List   Diagnosis Date Noted   Malignant neoplasm of right breast (HCC) 05/09/2024   S/P mastectomy, right 04/16/2024   Intractable nausea and vomiting 04/01/2024   Dehydration 04/01/2024   Epigastric pain 04/01/2024   High anion gap metabolic acidosis 04/01/2024   Hypercalcemia 04/01/2024   Hyperbilirubinemia 04/01/2024   Hypophosphatemia 04/01/2024   Chronic diastolic CHF (congestive heart failure) (HCC) 04/01/2024   Acquired hypothyroidism 04/01/2024   Allergic rhinitis 04/01/2024   Depression 04/01/2024   Nausea & vomiting 03/10/2024   Chemotherapy induced nausea and vomiting 03/10/2024   S/P left mastectomy 12/12/2023   Breast cancer, left breast (HCC) 11/20/2023   Port-A-Cath in place 10/18/2023   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) 10/09/2023   Closed compression fracture of body of L1 vertebra (HCC) 10/05/2021   Compression fracture of L1 lumbar vertebra (HCC) 10/04/2021   Fall at home, initial encounter 10/04/2021   Hypokalemia 10/04/2021   Scalp laceration 10/04/2021   Leukocytosis 10/04/2021   Primary osteoarthritis of left hip 06/04/2021   Atypical ductal hyperplasia of right breast 03/19/2018   Obtundation 12/12/2017    PCP:  Jon Kiang, MD  REFERRING PROVIDER: Marena Ebbie, MD  REFERRING DIAG: [phone] (ICD-10-CM) - Other specified postprocedural states C50.311 (ICD-10-CM) - Malignant neoplasm of lower-inner quadrant of right female breast Z17.0 (ICD-10-CM) - Estrogen receptor positive status (ER+) C50.912 (ICD-10-CM) - Malignant neoplasm of unspecified site of left female breast  THERAPY DIAG:  Malignant neoplasm of lower-inner quadrant of right breast of female, estrogen receptor positive (HCC)  Malignant neoplasm of upper-inner quadrant of left breast in female, estrogen receptor negative (HCC)  Aftercare following surgery for neoplasm  Stiffness of right shoulder, not elsewhere classified  Stiffness of left shoulder, not elsewhere classified  Abnormal posture  Rationale for Evaluation and Treatment: Rehabilitation  ONSET DATE: 04/16/24  SUBJECTIVE:  SUBJECTIVE STATEMENT: My neck isn't hurting anymore but it is just stiff.  The right pectoralis is tight and painful.    Eval: Patient was referred due to shoulder ROM and strength deficits bilaterally. She reports her incision site is doing a lot better. Patient reports her pain has gotten up to a 7/10 at its worst, and a 3/10 at its best. The burning sensation goes all the way into her hand. The patient has a compression sleeve however it is so tight that it has been bruising her wrist. Patient will wake up in the middle of the night with both arms feeling like they are asleep. She has been sleeping on the sofa since surgery as she cannot get comfortable in bed.   PERTINENT HISTORY:  Patient was diagnosed on 04/01/24 with right grade 2 IDC. It measured 2 cm and is located on the lower inner quadrant. Patient had a right mastectomy and sentinel node biopsy on 04/16/24.  The mastectomy took 3 months to heal and underwent additional surgery to remove more tissue from the area about a month ago. Patient was diagnosed on 09/22/23 with left grade 3 IDC. It measures at least 5mm and is located in the upper inner quadrant. It is ER/PR neg, HER2 unknown with a Ki67 unknown with 1 metastatic lymph node. Other hx: Cervical fusion.   PATIENT GOALS:  Reassess how my recovery is going related to arm function, pain, and swelling.  PAIN:  Are you having pain?PAIN:  Are you having pain? Yes NPRS scale: 1/10 Pain location: Rt pectoralis Pain orientation: Right  PAIN TYPE: sharp and tight Pain description: intermittent  Aggravating factors: getting out of bed Relieving factors: hot shower, moist heat   PRECAUTIONS: Recent Surgery, bilateral UE Lymphedema risk, hx of compression fracture of her vertebrae ~4 years ago  RED FLAGS: None   ACTIVITY LEVEL / LEISURE: Does music for church, plays the piano   OBJECTIVE:   PATIENT SURVEYS:  QUICK DASH: 56.82% on 08/27/24  OBSERVATIONS: - Chest skin mobile bilaterally - Incision on Rt side had multiple small, open spots; mild drainage observed   POSTURE:  Forward head and rounded shoulders  LYMPHEDEMA ASSESSMENT:   UPPER EXTREMITY AROM/PROM:   A/PROM RIGHT   eval   RIGHT 08/27/24  Shoulder extension 50 46  Shoulder flexion 145 109  Shoulder abduction 165 89 p!  Shoulder internal rotation 75   Shoulder external rotation 80                           (Blank rows = not tested)   A/PROM LEFT   eval LEFT  08/27/24  Shoulder extension 60 58  Shoulder flexion 150 140  Shoulder abduction 160 117 p!  Shoulder internal rotation 75 66  Shoulder external rotation 75 60                          (Blank rows = not tested)   CERVICAL AROM:     Percent limited EVAL  Flexion    Extension 50%  Right lateral flexion    Left lateral flexion    Right rotation 50%  Left rotation        UPPER EXTREMITY STRENGTH:      Eval:  5/5 Lt   5/5 Rt except for ER at 4/5   LYMPHEDEMA ASSESSMENTS (in cm):    LANDMARK RIGHT   eval RIGHT 08/27/24  10 cm proximal  to olecranon process 32 28.2  Olecranon process 28 25.1  10 cm proximal to ulnar styloid process 20.9 19.9  Just proximal to ulnar styloid process 17.2 16.4  Across hand at thumb web space 19.3 18.8  At base of 2nd digit 6.6 6.5  (Blank rows = not tested)   LANDMARK LEFT   eval LEFT  08/27/24  10 cm proximal to olecranon process 31.8 28.5  Olecranon process 28.5 25.3  10 cm proximal to ulnar styloid process 21.4 20.1  Just proximal to ulnar styloid process 17.8 16.4  Across hand at thumb web space 19.5 18.3  At base of 2nd digit 6.6 6.8  (Blank rows = not tested)  TREATMENT DATE:  09/17/24 Supine hooklying on large table: prolonged chest stretch 3x20  Alternating flexion x 10, snow angel x 10, Y AROM bil x 10 Dowel chest press 3# x 12 Supine chest stretch 10 x 5 MFR to right axilla and UE cording, and gently to right chest area PROM right shoulder flexion, scaption, abd, Er  09/12/2024 Supine wand flexion and scaption x 5 3 D AROM bilateral shoulders flexion, scaption, horizontal abd x 5 Soft tissue mobilization bilateral UT/posterior cervicals with cocoa butter with emphais on left MFR to right axilla and UE cording, and gently to right chest area PROM right shoulder flexion, scaption, abd, Er  09/10/24: Therapeutic Exercise: Pulleys flex & abd 2 min each - VC on proper technique Ball rolls into flexion and abd x 10 bil  Supine shoulder flexion with dowel x 10 with knees bent Snow angel position chest stretch 3x20 Alternating T x 5 bil  Manual Therapy: PROM into flexion, abduction & D2 bil with more pinning of cord at axilla on the Rt, nothing on the Lt MFR to cording in Rt arm - edu throughout on what cording is and how it causes some of the forearm pain.   09/04/24: Therapeutic Exercise: Pulleys flex & abd 2 min each -  VC on proper technique Table slides with ball flexion x 10 - did not feel a stretch with abd Ball rolls into abd x 10 bil  Supine shoulder flexion with dowel x 10 Supine pec stretch 2 x 30 sec hold  Manual Therapy: Rt shoulder PROM into flexion, abduction & D2 MFR to bilateral pectoralis and Rt UE including biceps with coco butter   PATIENT EDUCATION:  Education details: POC, HEP, not continuing SOZO Person educated: Patient Education method: Chief Technology Officer Education comprehension: verbalized understanding  HOME EXERCISE PROGRAM: Reviewed previously given post op HEP.  Access Code: [email or web address] URL: [email or web address] Date: 08/27/2024 Prepared by: Delon Pack  Exercises - Seated Shoulder Flexion Towel Slide at Table Top  - 1-2 x daily - 7 x weekly - 1 sets - 10 reps - 2-3 sec hold - Seated Shoulder Abduction Towel Slide at Table Top  - 1-2 x daily - 7 x weekly - 1 sets - 10 reps - 2-3 hold -As well as post op sheet  ASSESSMENT:  CLINICAL IMPRESSION:    Neck pain has resolved but is still a bit stiff.  Continued POC with focus on Rt chest stretching and release.    PT treatment/interventions: ADL/Self care home management, [phone]- PT Re-evaluation, 97110-Therapeutic exercises, 97530- Therapeutic activity, [id number]- Neuromuscular re-education, 97535- Self Care, [id number]- Manual therapy, Patient/Family education, Manual lymph drainage, Scar mobilization, Compression bandaging, and DME instructions   GOALS: Goals reviewed with patient? Yes  GOALS MET AT EVAL:  GOALS Name Target Date Goal status  1 Pt will be able to verbalize understanding of pertinent lymphedema risk reduction practices relevant to her dx specifically related to skin care.  Baseline:  No knowledge Eval Achieved at eval  2 Pt will be able to return demo and/or verbalize understanding of the post op HEP related to regaining shoulder ROM. Baseline:  No knowledge Eval Achieved at eval  3 Pt  will be able to verbalize understanding of the importance of viewing the post op After Breast CA Class video for further lymphedema risk reduction education and therapeutic exercise.  Baseline:  No knowledge Eval Achieved at eval   LONG TERM GOALS:  (STG=LTG)  GOALS Name Target Date  Goal status  1 Pt will demonstrate she has regained full shoulder ROM and function post operatively compared to baselines.  Baseline: 10/09/23 INITIAL  2 Patient will be able to play the piano for 30 minutes with no pain in order to play during church service 10/09/23 INITIAL  3 Patient will improve bilateral shoulder AROM to be at least back to her baseline measurements for functional mobility 10/09/23 INITIAL  4 Patient will decrease her Quick-DASH form by at least 15 points to demonstrate functional improvement 10/09/23 INITIAL     PLAN:  PT FREQUENCY/DURATION: 2x week for 6 weeks  PLAN FOR NEXT SESSION: Shoulder PROM bil, cording work Rt arm   Grayce Sheldon, PT 09/17/2024 10:54 AM

## 2024-09-20 ENCOUNTER — Ambulatory Visit: Admitting: Rehabilitation

## 2024-09-20 ENCOUNTER — Encounter: Payer: Self-pay | Admitting: Rehabilitation

## 2024-09-20 DIAGNOSIS — C50311 Malignant neoplasm of lower-inner quadrant of right female breast: Secondary | ICD-10-CM | POA: Diagnosis not present

## 2024-09-20 DIAGNOSIS — M25612 Stiffness of left shoulder, not elsewhere classified: Secondary | ICD-10-CM

## 2024-09-20 DIAGNOSIS — R293 Abnormal posture: Secondary | ICD-10-CM

## 2024-09-20 DIAGNOSIS — Z171 Estrogen receptor negative status [ER-]: Secondary | ICD-10-CM

## 2024-09-20 DIAGNOSIS — Z483 Aftercare following surgery for neoplasm: Secondary | ICD-10-CM

## 2024-09-20 DIAGNOSIS — M25611 Stiffness of right shoulder, not elsewhere classified: Secondary | ICD-10-CM

## 2024-09-20 NOTE — Therapy (Signed)
 " OUTPATIENT PHYSICAL THERAPY BREAST CANCER POST OP FOLLOW UP   Patient Name: Rebecca Steele MRN: [id number] DOB:August 11, 1951, 73 y.o., female Today's Date: 09/20/2024  END OF SESSION:  PT End of Session - 09/20/24 1157     Visit Number 6    Number of Visits 13    Date for Recertification  10/08/24    PT Start Time 1100    PT Stop Time 1153    PT Time Calculation (min) 53 min    Activity Tolerance Patient tolerated treatment well    Behavior During Therapy [facility] for tasks assessed/performed            Past Medical History:  Diagnosis Date   Breast mass, right    Cancer (HCC) 10/2023   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   Sleep apnea    HAS MILD OSA, NO CPAP NEEDED   Past Surgical History:  Procedure Laterality Date   APPENDECTOMY     AXILLARY SENTINEL NODE BIOPSY Right 04/16/2024   Procedure: BIOPSY, LYMPH NODE, SENTINEL, AXILLARY;  Surgeon: Ebbie Cough, MD;  Location: [facility];  Service: General;  Laterality: Right;  RIGHT MASTY RIGHT AXILLARY SENTINEL NODE BIOPSY   BREAST BIOPSY Right 09/16/2022   MM RT BREAST BX W LOC DEV 1ST LESION IMAGE BX SPEC STEREO GUIDE 09/16/2022 [facility]   BREAST BIOPSY  12/06/2022   MM RT RADIOACTIVE SEED LOC MAMMO GUIDE 12/06/2022 [facility]   BREAST BIOPSY Left 11/16/2023   US  LT RADIOACTIVE SEED LOC 11/16/2023 [facility]   BREAST BIOPSY  11/16/2023   MM LT RADIOACTIVE SEED EA ADD LESION LOC MAMMO GUIDE 11/16/2023 [facility]   BREAST BIOPSY Right 04/01/2024   US  RT BREAST BX W  LOC DEV 1ST LESION IMG BX SPEC US  GUIDE 04/01/2024 [facility]   BREAST EXCISIONAL BIOPSY Right 2018   BREAST LUMPECTOMY WITH RADIOACTIVE SEED AND SENTINEL LYMPH NODE BIOPSY Left 11/20/2023   Procedure: LEFT BREAST SEED GUIDED LUMPECTOMY, LEFT AXILLARY SENTINEL NODE BIOPSY;  Surgeon: Ebbie Cough, MD;  Location: [facility];  Service: General;  Laterality: Left;  PEC BLOCK   CHOLECYSTECTOMY     CYSTOSCOPY W/ RETROGRADES     KYPHOPLASTY N/A 10/05/2021   Procedure: LUMBAR ONE KYPHOPLASTY;  Surgeon: Cheryle Debby LABOR, MD;  Location: [facility];  Service: Neurosurgery;  Laterality: N/A;   MASS EXCISION Right 07/25/2024   Procedure: EXCISION, MASS, CHEST WALL;  Surgeon: Ebbie Cough, MD;  Location: [facility];  Service: General;  Laterality: Right;  EXCISION RIGHT MASTECTOMY WOUND   NECK SURGERY     PORTACATH PLACEMENT Right 10/12/2023   Procedure: INSERTION PORT-A-CATH WITH GUIDED ULTRASOUND;  Surgeon: Ebbie Cough, MD;  Location: [facility];  Service: General;  Laterality: Right;   RADIOACTIVE SEED GUIDED AXILLARY SENTINEL LYMPH NODE Left 11/20/2023   Procedure: LEFT AXILLARY NODE SEED GUIDED EXCISION;  Surgeon: Ebbie Cough, MD;  Location: [facility];  Service: General;  Laterality: Left;   RADIOACTIVE SEED GUIDED EXCISIONAL BREAST BIOPSY Right 12/12/2017   Procedure: RIGHT RADIOACTIVE SEED GUIDED EXCISIONAL BREAST BIOPSY ERAS PATHWAY;  Surgeon: Ebbie Cough, MD;  Location: [facility];  Service: General;  Laterality: Right;  LMA   RADIOACTIVE SEED GUIDED EXCISIONAL BREAST BIOPSY Right 12/07/2022   Procedure: RADIOACTIVE SEED GUIDED EXCISIONAL RIGHT BREAST BIOPSY;  Surgeon: Ebbie Cough, MD;  Location: [facility];  Service: General;  Laterality: Right;   SHOULDER SURGERY     SIMPLE MASTECTOMY WITH AXILLARY SENTINEL NODE BIOPSY Left 12/12/2023   Procedure: LEFT  MASTECTOMY;   Surgeon: Ebbie Cough, MD;  Location: [facility];  Service: General;  Laterality: Left;   TONSILLECTOMY     TOTAL HIP ARTHROPLASTY Left 06/04/2021   Procedure: TOTAL HIP ARTHROPLASTY ANTERIOR APPROACH;  Surgeon: Yvone Rush, MD;  Location: [facility];  Service: Orthopedics;  Laterality: Left;   TOTAL MASTECTOMY Right 04/16/2024   Procedure: MASTECTOMY, SIMPLE;  Surgeon: Ebbie Cough, MD;  Location: [facility];  Service: General;  Laterality: Right;   TRIGGER FINGER RELEASE Right 05/18/2015   Procedure: RIGHT  LONG FINGER TRIGGER RELEASE ;  Surgeon: Alm Hummer, MD;  Location:  SUR[facility]ERY [facility];  Service: Orthopedics;  Laterality: Right;   TUBAL LIGATION     Patient Active Problem List   Diagnosis Date Noted   Malignant neoplasm of right breast (HCC) 05/09/2024   S/P mastectomy, right 04/16/2024   Intractable nausea and vomiting 04/01/2024   Dehydration 04/01/2024   Epigastric pain 04/01/2024   High anion gap metabolic acidosis 04/01/2024   Hypercalcemia 04/01/2024   Hyperbilirubinemia 04/01/2024   Hypophosphatemia 04/01/2024   Chronic diastolic CHF (congestive heart failure) (HCC) 04/01/2024   Acquired hypothyroidism 04/01/2024   Allergic rhinitis 04/01/2024   Depression 04/01/2024   Nausea & vomiting 03/10/2024   Chemotherapy induced nausea and vomiting 03/10/2024   S/P left mastectomy 12/12/2023   Breast cancer, left breast (HCC) 11/20/2023   Port-A-Cath in place 10/18/2023   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) 10/09/2023   Closed compression fracture of body of L1 vertebra (HCC) 10/05/2021   Compression fracture of L1 lumbar vertebra (HCC) 10/04/2021   Fall at home, initial encounter 10/04/2021   Hypokalemia 10/04/2021   Scalp laceration 10/04/2021   Leukocytosis 10/04/2021   Primary osteoarthritis of left hip 06/04/2021   Atypical ductal hyperplasia of right breast 03/19/2018   Obtundation 12/12/2017    PCP:  Jon Kiang, MD  REFERRING PROVIDER: Marena Ebbie, MD  REFERRING DIAG: [phone] (ICD-10-CM) - Other specified postprocedural states C50.311 (ICD-10-CM) - Malignant neoplasm of lower-inner quadrant of right female breast Z17.0 (ICD-10-CM) - Estrogen receptor positive status (ER+) C50.912 (ICD-10-CM) - Malignant neoplasm of unspecified site of left female breast  THERAPY DIAG:  Malignant neoplasm of lower-inner quadrant of right breast of female, estrogen receptor positive (HCC)  Malignant neoplasm of upper-inner quadrant of left breast in female, estrogen receptor negative (HCC)  Aftercare following surgery for neoplasm  Stiffness of right shoulder, not elsewhere classified  Stiffness of left shoulder, not elsewhere classified  Abnormal posture  Rationale for Evaluation and Treatment: Rehabilitation  ONSET DATE: 04/16/24  SUBJECTIVE:  SUBJECTIVE STATEMENT: My left side just feels tight today.   More than the Rt which is opposite.  I may not do any visits ito the new year just to save money.   Eval: Patient was referred due to shoulder ROM and strength deficits bilaterally. She reports her incision site is doing a lot better. Patient reports her pain has gotten up to a 7/10 at its worst, and a 3/10 at its best. The burning sensation goes all the way into her hand. The patient has a compression sleeve however it is so tight that it has been bruising her wrist. Patient will wake up in the middle of the night with both arms feeling like they are asleep. She has been sleeping on the sofa since surgery as she cannot get comfortable in bed.   PERTINENT HISTORY:  Patient was diagnosed on 04/01/24 with right grade 2 IDC. It measured 2 cm and is located on the lower inner quadrant. Patient had a right mastectomy  and sentinel node biopsy on 04/16/24. The mastectomy took 3 months to heal and underwent additional surgery to remove more tissue from the area about a month ago. Patient was diagnosed on 09/22/23 with left grade 3 IDC. It measures at least 5mm and is located in the upper inner quadrant. It is ER/PR neg, HER2 unknown with a Ki67 unknown with 1 metastatic lymph node. Other hx: Cervical fusion.   PATIENT GOALS:  Reassess how my recovery is going related to arm function, pain, and swelling.  PAIN:  Are you having pain?PAIN:  Are you having pain? No  PRECAUTIONS: Recent Surgery, bilateral UE Lymphedema risk, hx of compression fracture of her vertebrae ~4 years ago  RED FLAGS: None   ACTIVITY LEVEL / LEISURE: Does music for church, plays the piano   OBJECTIVE:   PATIENT SURVEYS:  QUICK DASH: 56.82% on 08/27/24  OBSERVATIONS: - Chest skin mobile bilaterally - Incision on Rt side had multiple small, open spots; mild drainage observed   POSTURE:  Forward head and rounded shoulders  LYMPHEDEMA ASSESSMENT:   UPPER EXTREMITY AROM/PROM:   A/PROM RIGHT   eval   RIGHT 08/27/24  Shoulder extension 50 46  Shoulder flexion 145 109  Shoulder abduction 165 89 p!  Shoulder internal rotation 75   Shoulder external rotation 80                           (Blank rows = not tested)   A/PROM LEFT   eval LEFT  08/27/24  Shoulder extension 60 58  Shoulder flexion 150 140  Shoulder abduction 160 117 p!  Shoulder internal rotation 75 66  Shoulder external rotation 75 60                          (Blank rows = not tested)   CERVICAL AROM:     Percent limited EVAL  Flexion    Extension 50%  Right lateral flexion    Left lateral flexion    Right rotation 50%  Left rotation        UPPER EXTREMITY STRENGTH:     Eval:  5/5 Lt   5/5 Rt except for ER at 4/5   LYMPHEDEMA ASSESSMENTS (in cm):    LANDMARK RIGHT   eval RIGHT 08/27/24  10 cm proximal to olecranon process 32 28.2   Olecranon process 28 25.1  10 cm proximal to ulnar styloid process 20.9 19.9  Just proximal to ulnar styloid process 17.2 16.4  Across hand at thumb web space 19.3 18.8  At base of 2nd digit 6.6 6.5  (Blank rows = not tested)   LANDMARK LEFT   eval LEFT  08/27/24  10 cm proximal to olecranon process 31.8 28.5  Olecranon process 28.5 25.3  10 cm proximal to ulnar styloid process 21.4 20.1  Just proximal to ulnar styloid process 17.8 16.4  Across hand at thumb web space 19.5 18.3  At base of 2nd digit 6.6 6.8  (Blank rows = not tested)  TREATMENT DATE:  09/20/24 Supine hooklying on large table: prolonged chest stretch 3x30  Alternating flexion x 10 Dowel chest press 3# x 12 Supine chest stretch 10 x 5 Row yellow x 15 MFR to left chest focusing at the pectoralis and an area that feels like cording at the lateral mastectomy incision.  Then PROM into flexion, abduction, D2 with cording pinning.  STM to the Rt pectoralis and PROM right shoulder flexion, scaption, abd, Er  09/17/24 Supine hooklying on large table: prolonged chest stretch 3x20  Alternating flexion x 10, snow angel x 10, Y AROM bil x 10 Dowel chest press 3# x 12 Supine chest stretch 10 x 5 MFR to right axilla and UE cording, and gently to right chest area PROM right shoulder flexion, scaption, abd, Er  09/12/2024 Supine wand flexion and scaption x 5 3 D AROM bilateral shoulders flexion, scaption, horizontal abd x 5 Soft tissue mobilization bilateral UT/posterior cervicals with cocoa butter with emphais on left MFR to right axilla and UE cording, and gently to right chest area PROM right shoulder flexion, scaption, abd, Er  PATIENT EDUCATION:  Education details: POC, HEP, not continuing SOZO Person educated: Patient Education method: Explanation and Handouts Education comprehension: verbalized understanding  HOME EXERCISE PROGRAM: Reviewed previously given post op HEP.  Access Code: [email or web address] URL:  [email or web address] Date: 08/27/2024 Prepared by: Delon Pack  Exercises - Seated Shoulder Flexion Towel Slide at Table Top  - 1-2 x daily - 7 x weekly - 1 sets - 10 reps - 2-3 sec hold - Seated Shoulder Abduction Towel Slide at Table Top  - 1-2 x daily - 7 x weekly - 1 sets - 10 reps - 2-3 hold -As well as post op sheet  ASSESSMENT:  CLINICAL IMPRESSION:    Pt today feels like the Left side is stiffer.  Cording seems to be noted at the lateral incision up towards the axilla and maybe into the upper arm.  Focused more on the Left side today which feels improved after MT.    PT treatment/interventions: ADL/Self care home management, [phone]- PT Re-evaluation, 97110-Therapeutic exercises, 97530- Therapeutic activity, 97112- Neuromuscular re-education, 97535- Self Care, [id number]- Manual therapy, Patient/Family education, Manual lymph drainage, Scar mobilization, Compression bandaging, and DME instructions   GOALS: Goals reviewed with patient? Yes  GOALS MET AT EVAL:  GOALS Name Target Date Goal status  1 Pt will be able to verbalize understanding of pertinent lymphedema risk reduction practices relevant to her dx specifically related to skin care.  Baseline:  No knowledge Eval Achieved at eval  2 Pt will be able to return demo and/or verbalize understanding of the post op HEP related to regaining shoulder ROM. Baseline:  No knowledge Eval Achieved at eval  3 Pt will be able to verbalize understanding of the importance of viewing the post op After Breast CA Class video for further lymphedema risk reduction education and therapeutic exercise.  Baseline:  No knowledge Eval Achieved at eval   LONG TERM GOALS:  (STG=LTG)  GOALS Name Target Date  Goal status  1 Pt will demonstrate she has regained full shoulder ROM and function post operatively compared to baselines.  Baseline: 10/09/23 INITIAL  2 Patient will be able to play the piano for 30 minutes with no pain in order to  play during church service 10/09/23 INITIAL  3 Patient will improve bilateral shoulder AROM to be at least back to her baseline measurements for functional mobility 10/09/23 INITIAL  4 Patient will decrease her Quick-DASH form by at least 15 points to demonstrate functional improvement 10/09/23 INITIAL     PLAN:  PT FREQUENCY/DURATION: 2x week for 6 weeks  PLAN FOR NEXT SESSION: how is the Lt side? Shoulder PROM bil, cording work both - maybe be done before 10/03/24  Saddie Raw, PT 09/20/2024, 12:00 PM   09/20/2024 11:57 AM  "

## 2024-09-23 ENCOUNTER — Encounter: Payer: Self-pay | Admitting: Rehabilitation

## 2024-09-23 ENCOUNTER — Ambulatory Visit: Admitting: Rehabilitation

## 2024-09-23 DIAGNOSIS — Z170 Estrogen receptor positive status [ER+]: Secondary | ICD-10-CM

## 2024-09-23 DIAGNOSIS — M25611 Stiffness of right shoulder, not elsewhere classified: Secondary | ICD-10-CM

## 2024-09-23 DIAGNOSIS — Z483 Aftercare following surgery for neoplasm: Secondary | ICD-10-CM

## 2024-09-23 DIAGNOSIS — R293 Abnormal posture: Secondary | ICD-10-CM

## 2024-09-23 DIAGNOSIS — C50311 Malignant neoplasm of lower-inner quadrant of right female breast: Secondary | ICD-10-CM | POA: Diagnosis not present

## 2024-09-23 DIAGNOSIS — M25612 Stiffness of left shoulder, not elsewhere classified: Secondary | ICD-10-CM

## 2024-09-23 DIAGNOSIS — Z171 Estrogen receptor negative status [ER-]: Secondary | ICD-10-CM

## 2024-09-23 NOTE — Therapy (Signed)
 " OUTPATIENT PHYSICAL THERAPY BREAST CANCER POST OP FOLLOW UP   Patient Name: Rebecca Steele MRN: [id number] DOB:[birth year], 73 y.o., female Today's Date: 09/23/2024  END OF SESSION:  PT End of Session - 09/23/24 1152     Visit Number 7    Number of Visits 13    Date for Recertification  10/08/24    PT Start Time 1155    PT Stop Time 1245    PT Time Calculation (min) 50 min    Activity Tolerance Patient tolerated treatment well    Behavior During Therapy [facility] for tasks assessed/performed            Past Medical History:  Diagnosis Date   Breast mass, right    Cancer (HCC) 10/2023   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   Sleep apnea    HAS MILD OSA, NO CPAP NEEDED   Past Surgical History:  Procedure Laterality Date   APPENDECTOMY     AXILLARY SENTINEL NODE BIOPSY Right 04/16/2024   Procedure: BIOPSY, LYMPH NODE, SENTINEL, AXILLARY;  Surgeon: Ebbie Cough, MD;  Location: [facility];  Service: General;  Laterality: Right;  RIGHT MASTY RIGHT AXILLARY SENTINEL NODE BIOPSY   BREAST BIOPSY Right 09/16/2022   MM RT BREAST BX W LOC DEV 1ST LESION IMAGE BX SPEC STEREO GUIDE 09/16/2022 [facility]   BREAST BIOPSY  12/06/2022   MM RT RADIOACTIVE SEED LOC MAMMO GUIDE 12/06/2022 [facility]   BREAST BIOPSY Left 11/16/2023   US  LT RADIOACTIVE SEED LOC 11/16/2023 [facility]   BREAST BIOPSY  11/16/2023   MM LT RADIOACTIVE SEED EA ADD LESION LOC MAMMO GUIDE 11/16/2023 [facility]   BREAST BIOPSY Right 04/01/2024   US  RT BREAST BX W  LOC DEV 1ST LESION IMG BX SPEC US  GUIDE 04/01/2024 [facility]   BREAST EXCISIONAL BIOPSY Right 2018   BREAST LUMPECTOMY WITH RADIOACTIVE SEED AND SENTINEL LYMPH NODE BIOPSY Left 11/20/2023   Procedure: LEFT BREAST SEED GUIDED LUMPECTOMY, LEFT AXILLARY SENTINEL NODE BIOPSY;  Surgeon: Ebbie Cough, MD;  Location: [facility];  Service: General;  Laterality: Left;  PEC BLOCK   CHOLECYSTECTOMY     CYSTOSCOPY W/ RETROGRADES     KYPHOPLASTY N/A 10/05/2021   Procedure: LUMBAR ONE KYPHOPLASTY;  Surgeon: Cheryle Debby LABOR, MD;  Location: [facility];  Service: Neurosurgery;  Laterality: N/A;   MASS EXCISION Right 07/25/2024   Procedure: EXCISION, MASS, CHEST WALL;  Surgeon: Ebbie Cough, MD;  Location: [facility];  Service: General;  Laterality: Right;  EXCISION RIGHT MASTECTOMY WOUND   NECK SURGERY     PORTACATH PLACEMENT Right 10/12/2023   Procedure: INSERTION PORT-A-CATH WITH GUIDED ULTRASOUND;  Surgeon: Ebbie Cough, MD;  Location: [facility];  Service: General;  Laterality: Right;   RADIOACTIVE SEED GUIDED AXILLARY SENTINEL LYMPH NODE Left 11/20/2023   Procedure: LEFT AXILLARY NODE SEED GUIDED EXCISION;  Surgeon: Ebbie Cough, MD;  Location: [facility];  Service: General;  Laterality: Left;   RADIOACTIVE SEED GUIDED EXCISIONAL BREAST BIOPSY Right 12/12/2017   Procedure: RIGHT RADIOACTIVE SEED GUIDED EXCISIONAL BREAST BIOPSY ERAS PATHWAY;  Surgeon: Ebbie Cough, MD;  Location: [facility];  Service: General;  Laterality: Right;  LMA   RADIOACTIVE SEED GUIDED EXCISIONAL BREAST BIOPSY Right 12/07/2022   Procedure: RADIOACTIVE SEED GUIDED EXCISIONAL RIGHT BREAST BIOPSY;  Surgeon: Ebbie Cough, MD;  Location: [facility];  Service: General;  Laterality: Right;   SHOULDER SURGERY     SIMPLE MASTECTOMY WITH AXILLARY SENTINEL NODE BIOPSY Left 12/12/2023   Procedure: LEFT  MASTECTOMY;   Surgeon: Ebbie Cough, MD;  Location: [facility];  Service: General;  Laterality: Left;   TONSILLECTOMY     TOTAL HIP ARTHROPLASTY Left 06/04/2021   Procedure: TOTAL HIP ARTHROPLASTY ANTERIOR APPROACH;  Surgeon: Yvone Rush, MD;  Location: [facility];  Service: Orthopedics;  Laterality: Left;   TOTAL MASTECTOMY Right 04/16/2024   Procedure: MASTECTOMY, SIMPLE;  Surgeon: Ebbie Cough, MD;  Location[facility];  Service: General;  Laterality: Right;   TRIGGER FINGER RELEASE Right 05/18/2015   Procedure: RIGHT  LONG FINGER TRIGGER RELEASE ;  Surgeon: Alm Hummer, MD;  Location: [facility];  Service: Orthopedics;  Laterality: Right;   TUBAL LIGATION     Patient Active Problem List   Diagnosis Date Noted   Malignant neoplasm of right breast (HCC) 05/09/2024   S/P mastectomy, right 04/16/2024   Intractable nausea and vomiting 04/01/2024   Dehydration 04/01/2024   Epigastric pain 04/01/2024   High anion gap metabolic acidosis 04/01/2024   Hypercalcemia 04/01/2024   Hyperbilirubinemia 04/01/2024   Hypophosphatemia 04/01/2024   Chronic diastolic CHF (congestive heart failure) (HCC) 04/01/2024   Acquired hypothyroidism 04/01/2024   Allergic rhinitis 04/01/2024   Depression 04/01/2024   Nausea & vomiting 03/10/2024   Chemotherapy induced nausea and vomiting 03/10/2024   S/P left mastectomy 12/12/2023   Breast cancer, left breast (HCC) 11/20/2023   Port-A-Cath in place 10/18/2023   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) 10/09/2023   Closed compression fracture of body of L1 vertebra (HCC) 10/05/2021   Compression fracture of L1 lumbar vertebra (HCC) 10/04/2021   Fall at home, initial encounter 10/04/2021   Hypokalemia 10/04/2021   Scalp laceration 10/04/2021   Leukocytosis 10/04/2021   Primary osteoarthritis of left hip 06/04/2021   Atypical ductal hyperplasia of right breast 03/19/2018   Obtundation 12/12/2017    PCP:  Jon Kiang, MD  REFERRING PROVIDER: Marena Ebbie, MD  REFERRING DIAG: [phone] (ICD-10-CM) - Other specified postprocedural states C50.311 (ICD-10-CM) - Malignant neoplasm of lower-inner quadrant of right female breast Z17.0 (ICD-10-CM) - Estrogen receptor positive status (ER+) C50.912 (ICD-10-CM) - Malignant neoplasm of unspecified site of left female breast  THERAPY DIAG:  Malignant neoplasm of lower-inner quadrant of right breast of female, estrogen receptor positive (HCC)  Malignant neoplasm of upper-inner quadrant of left breast in female, estrogen receptor negative (HCC)  Aftercare following surgery for neoplasm  Stiffness of right shoulder, not elsewhere classified  Stiffness of left shoulder, not elsewhere classified  Abnormal posture  Rationale for Evaluation and Treatment: Rehabilitation  ONSET DATE: 04/16/24  SUBJECTIVE:  SUBJECTIVE STATEMENT: The left side is still the worst but better.     Eval: Patient was referred due to shoulder ROM and strength deficits bilaterally. She reports her incision site is doing a lot better. Patient reports her pain has gotten up to a 7/10 at its worst, and a 3/10 at its best. The burning sensation goes all the way into her hand. The patient has a compression sleeve however it is so tight that it has been bruising her wrist. Patient will wake up in the middle of the night with both arms feeling like they are asleep. She has been sleeping on the sofa since surgery as she cannot get comfortable in bed.   PERTINENT HISTORY:  Patient was diagnosed on 04/01/24 with right grade 2 IDC. It measured 2 cm and is located on the lower inner quadrant. Patient had a right mastectomy and sentinel node biopsy on 04/16/24. The mastectomy took 3 months to heal and underwent  additional surgery to remove more tissue from the area about a month ago. Patient was diagnosed on 09/22/23 with left grade 3 IDC. It measures at least 5mm and is located in the upper inner quadrant. It is ER/PR neg, HER2 unknown with a Ki67 unknown with 1 metastatic lymph node. Other hx: Cervical fusion.   PATIENT GOALS:  Reassess how my recovery is going related to arm function, pain, and swelling.  PAIN:  Are you having pain?PAIN:  Are you having pain? No  PRECAUTIONS: Recent Surgery, bilateral UE Lymphedema risk, hx of compression fracture of her vertebrae ~4 years ago  RED FLAGS: None   ACTIVITY LEVEL / LEISURE: Does music for church, plays the piano   OBJECTIVE:   PATIENT SURVEYS:  QUICK DASH: 56.82% on 08/27/24  OBSERVATIONS: - Chest skin mobile bilaterally - Incision on Rt side had multiple small, open spots; mild drainage observed   POSTURE:  Forward head and rounded shoulders  LYMPHEDEMA ASSESSMENT:   UPPER EXTREMITY AROM/PROM:   A/PROM RIGHT   eval   RIGHT 08/27/24  Shoulder extension 50 46  Shoulder flexion 145 109  Shoulder abduction 165 89 p!  Shoulder internal rotation 75   Shoulder external rotation 80                           (Blank rows = not tested)   A/PROM LEFT   eval LEFT  08/27/24  Shoulder extension 60 58  Shoulder flexion 150 140  Shoulder abduction 160 117 p!  Shoulder internal rotation 75 66  Shoulder external rotation 75 60                          (Blank rows = not tested)   CERVICAL AROM:     Percent limited EVAL  Flexion    Extension 50%  Right lateral flexion    Left lateral flexion    Right rotation 50%  Left rotation        UPPER EXTREMITY STRENGTH:     Eval:  5/5 Lt   5/5 Rt except for ER at 4/5   LYMPHEDEMA ASSESSMENTS (in cm):    LANDMARK RIGHT   eval RIGHT 08/27/24  10 cm proximal to olecranon process 32 28.2  Olecranon process 28 25.1  10 cm proximal to ulnar styloid process 20.9 19.9  Just proximal  to ulnar styloid process 17.2 16.4  Across hand at thumb web space 19.3 18.8  At base  of 2nd digit 6.6 6.5  (Blank rows = not tested)   LANDMARK LEFT   eval LEFT  08/27/24  10 cm proximal to olecranon process 31.8 28.5  Olecranon process 28.5 25.3  10 cm proximal to ulnar styloid process 21.4 20.1  Just proximal to ulnar styloid process 17.8 16.4  Across hand at thumb web space 19.5 18.3  At base of 2nd digit 6.6 6.8  (Blank rows = not tested)  TREATMENT DATE:  09/23/24 Manual Therapy: MFR to bil chest focusing at the pectoralis. Then PROM into flexion, abduction, D2 with cording pinning.  Therapeutic Exercise: Alternating flexion x 10 Dowel chest press 4# x 12 Supine chest stretch 10 x 5 Row yellow x 15  09/20/24 Supine hooklying on large table: prolonged chest stretch 3x30  Alternating flexion x 10 Dowel chest press 3# x 12 Supine chest stretch 10 x 5 Row yellow x 15 MFR to left chest focusing at the pectoralis and an area that feels like cording at the lateral mastectomy incision.  Then PROM into flexion, abduction, D2 with cording pinning.  STM to the Rt pectoralis and PROM right shoulder flexion, scaption, abd, Er  09/17/24 Supine hooklying on large table: prolonged chest stretch 3x20  Alternating flexion x 10, snow angel x 10, Y AROM bil x 10 Dowel chest press 3# x 12 Supine chest stretch 10 x 5 MFR to right axilla and UE cording, and gently to right chest area PROM right shoulder flexion, scaption, abd, Er  09/12/2024 Supine wand flexion and scaption x 5 3 D AROM bilateral shoulders flexion, scaption, horizontal abd x 5 Soft tissue mobilization bilateral UT/posterior cervicals with cocoa butter with emphais on left MFR to right axilla and UE cording, and gently to right chest area PROM right shoulder flexion, scaption, abd, Er  PATIENT EDUCATION:  Education details: POC, HEP, not continuing SOZO Person educated: Patient Education method: Explanation  and Handouts Education comprehension: verbalized understanding  HOME EXERCISE PROGRAM: Reviewed previously given post op HEP.  Access Code: [email or web address] URL: [email or web address] Date: 08/27/2024 Prepared by: Delon Pack  Exercises - Seated Shoulder Flexion Towel Slide at Table Top  - 1-2 x daily - 7 x weekly - 1 sets - 10 reps - 2-3 sec hold - Seated Shoulder Abduction Towel Slide at Table Top  - 1-2 x daily - 7 x weekly - 1 sets - 10 reps - 2-3 hold -As well as post op sheet  ASSESSMENT:  CLINICAL IMPRESSION:    Pt today feels like the Left side is stiffer.  Cording seems to be noted at the lateral incision up towards the axilla and maybe into the upper arm.  Focused more on the Left side today which feels improved after MT.    PT treatment/interventions: ADL/Self care home management, [phone]- PT Re-evaluation, 97110-Therapeutic exercises, 97530- Therapeutic activity, 97112- Neuromuscular re-education, 97535- Self Care, [id number]- Manual therapy, Patient/Family education, Manual lymph drainage, Scar mobilization, Compression bandaging, and DME instructions   GOALS: Goals reviewed with patient? Yes  GOALS MET AT EVAL:  GOALS Name Target Date Goal status  1 Pt will be able to verbalize understanding of pertinent lymphedema risk reduction practices relevant to her dx specifically related to skin care.  Baseline:  No knowledge Eval Achieved at eval  2 Pt will be able to return demo and/or verbalize understanding of the post op HEP related to regaining shoulder ROM. Baseline:  No knowledge Eval Achieved at eval  3 Pt will be able to verbalize understanding of  the importance of viewing the post op After Breast CA Class video for further lymphedema risk reduction education and therapeutic exercise.  Baseline:  No knowledge Eval Achieved at eval   LONG TERM GOALS:  (STG=LTG)  GOALS Name Target Date  Goal status  1 Pt will demonstrate she has regained full shoulder ROM and  function post operatively compared to baselines.  Baseline: 10/09/23 INITIAL  2 Patient will be able to play the piano for 30 minutes with no pain in order to play during church service 10/09/23 INITIAL  3 Patient will improve bilateral shoulder AROM to be at least back to her baseline measurements for functional mobility 10/09/23 INITIAL  4 Patient will decrease her Quick-DASH form by at least 15 points to demonstrate functional improvement 10/09/23 INITIAL     PLAN:  PT FREQUENCY/DURATION: 2x week for 6 weeks  PLAN FOR NEXT SESSION: how is the Lt side? Shoulder PROM bil, cording work both - maybe be done before 10/03/24  Saddie Raw, PT 09/23/2024, 2:50 PM     "

## 2024-09-30 ENCOUNTER — Ambulatory Visit

## 2024-09-30 DIAGNOSIS — R293 Abnormal posture: Secondary | ICD-10-CM

## 2024-09-30 DIAGNOSIS — M25611 Stiffness of right shoulder, not elsewhere classified: Secondary | ICD-10-CM

## 2024-09-30 DIAGNOSIS — Z171 Estrogen receptor negative status [ER-]: Secondary | ICD-10-CM

## 2024-09-30 DIAGNOSIS — M25612 Stiffness of left shoulder, not elsewhere classified: Secondary | ICD-10-CM

## 2024-09-30 DIAGNOSIS — C50311 Malignant neoplasm of lower-inner quadrant of right female breast: Secondary | ICD-10-CM

## 2024-09-30 DIAGNOSIS — Z483 Aftercare following surgery for neoplasm: Secondary | ICD-10-CM

## 2024-09-30 NOTE — Therapy (Signed)
 " OUTPATIENT PHYSICAL THERAPY BREAST CANCER POST OP FOLLOW UP   Patient Name: Rebecca Steele MRN: [id number] DOB:1951/06/27, 73 y.o., female Today's Date: 09/30/2024  END OF SESSION:  PT End of Session - 09/30/24 1203     Visit Number 8    Number of Visits 13    Date for Recertification  10/08/24    Authorization Type no auth    PT Start Time 1203    PT Stop Time 1300    PT Time Calculation (min) 57 min    Activity Tolerance Patient tolerated treatment well    Behavior During Therapy 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   Sleep apnea    HAS MILD OSA, NO CPAP NEEDED   Past Surgical History:  Procedure Laterality Date   APPENDECTOMY     AXILLARY SENTINEL NODE BIOPSY Right 04/16/2024   Procedure: BIOPSY, LYMPH NODE, SENTINEL, AXILLARY;  Surgeon: Ebbie Cough, MD;  Location: [facility];  Service: General;  Laterality: Right;  RIGHT MASTY RIGHT AXILLARY SENTINEL NODE BIOPSY   BREAST BIOPSY Right 09/16/2022   MM RT BREAST BX W LOC DEV 1ST LESION IMAGE BX SPEC STEREO GUIDE 09/16/2022 [facility]   BREAST BIOPSY  12/06/2022   MM RT RADIOACTIVE SEED LOC MAMMO GUIDE 12/06/2022 [facility]   BREAST BIOPSY Left 11/16/2023   US  LT RADIOACTIVE SEED LOC 11/16/2023 [facility]   BREAST BIOPSY  11/16/2023   MM LT RADIOACTIVE SEED EA ADD LESION LOC MAMMO GUIDE 11/16/2023 [facility]   BREAST BIOPSY Right  04/01/2024   US  RT BREAST BX W LOC DEV 1ST LESION IMG BX SPEC US  GUIDE 04/01/2024 [facility]   BREAST EXCISIONAL BIOPSY Right 2018   BREAST LUMPECTOMY WITH RADIOACTIVE SEED AND SENTINEL LYMPH NODE BIOPSY Left 11/20/2023   Procedure: LEFT BREAST SEED GUIDED LUMPECTOMY, LEFT AXILLARY SENTINEL NODE BIOPSY;  Surgeon: Ebbie Cough, MD;  Location: [facility];  Service: General;  Laterality: Left;  PEC BLOCK   CHOLECYSTECTOMY     CYSTOSCOPY W/ RETROGRADES     KYPHOPLASTY N/A 10/05/2021   Procedure: LUMBAR ONE KYPHOPLASTY;  Surgeon: Cheryle Debby LABOR, MD;  Location: [facility];  Service: Neurosurgery;  Laterality: N/A;   MASS EXCISION Right 07/25/2024   Procedure: EXCISION, MASS, CHEST WALL;  Surgeon: Ebbie Cough, MD;  Location: [facility];  Service: General;  Laterality: Right;  EXCISION RIGHT MASTECTOMY WOUND   NECK SURGERY     PORTACATH PLACEMENT Right 10/12/2023   Procedure: INSERTION PORT-A-CATH WITH GUIDED ULTRASOUND;  Surgeon: Ebbie Cough, MD;  Location: [facility];  Service: General;  Laterality: Right;   RADIOACTIVE SEED GUIDED AXILLARY SENTINEL LYMPH NODE Left 11/20/2023   Procedure: LEFT AXILLARY NODE SEED GUIDED EXCISION;  Surgeon: Ebbie Cough, MD;  Location:  [facility];  Service: General;  Laterality: Left;   RADIOACTIVE SEED GUIDED EXCISIONAL BREAST BIOPSY Right 12/12/2017   Procedure: RIGHT RADIOACTIVE SEED GUIDED EXCISIONAL BREAST BIOPSY ERAS PATHWAY;  Surgeon: Ebbie Cough, MD;  Location: [facility];  Service: General;  Laterality: Right;  LMA   RADIOACTIVE SEED GUIDED EXCISIONAL BREAST BIOPSY Right 12/07/2022   Procedure: RADIOACTIVE SEED GUIDED EXCISIONAL RIGHT BREAST BIOPSY;  Surgeon: Ebbie Cough, MD;  Location: [facility];  Service: General;  Laterality: Right;   SHOULDER SURGERY     SIMPLE MASTECTOMY WITH AXILLARY SENTINEL NODE BIOPSY Left 12/12/2023    Procedure: LEFT  MASTECTOMY;  Surgeon: Ebbie Cough, MD;  Location: [facility];  Service: General;  Laterality: Left;   TONSILLECTOMY     TOTAL HIP ARTHROPLASTY Left 06/04/2021   Procedure: TOTAL HIP ARTHROPLASTY ANTERIOR APPROACH;  Surgeon: Yvone Rush, MD;  Location: [facility];  Service: Orthopedics;  Laterality: Left;   TOTAL MASTECTOMY Right 04/16/2024   Procedure: MASTECTOMY, SIMPLE;  Surgeon: Ebbie Cough, MD;  Location: [facility];  Service: General;  Laterality: Right;   TRIGGER FINGER RELEASE Right 05/18/2015   Procedure: RIGHT  LONG FINGER TRIGGER RELEASE ;  Surgeon: Alm Hummer, MD;  Location: [facility];  Service: Orthopedics;  Laterality: Right;   TUBAL LIGATION     Patient Active Problem List   Diagnosis Date Noted   Malignant neoplasm of right breast (HCC) 05/09/2024   S/P mastectomy, right 04/16/2024   Intractable nausea and vomiting 04/01/2024   Dehydration 04/01/2024   Epigastric pain 04/01/2024   High anion gap metabolic acidosis 04/01/2024   Hypercalcemia 04/01/2024   Hyperbilirubinemia 04/01/2024   Hypophosphatemia 04/01/2024   Chronic diastolic CHF (congestive heart failure) (HCC) 04/01/2024   Acquired hypothyroidism 04/01/2024   Allergic rhinitis 04/01/2024   Depression 04/01/2024   Nausea & vomiting 03/10/2024   Chemotherapy induced nausea and vomiting 03/10/2024   S/P left mastectomy 12/12/2023   Breast cancer, left breast (HCC) 11/20/2023   Port-A-Cath in place 10/18/2023   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) 10/09/2023   Closed compression fracture of body of L1 vertebra (HCC) 10/05/2021   Compression fracture of L1 lumbar vertebra (HCC) 10/04/2021   Fall at home, initial encounter 10/04/2021   Hypokalemia 10/04/2021   Scalp laceration 10/04/2021   Leukocytosis 10/04/2021   Primary osteoarthritis of left hip 06/04/2021   Atypical ductal hyperplasia of right breast 03/19/2018    Obtundation 12/12/2017    PCP: Jon Kiang, MD  REFERRING PROVIDER: Marena Ebbie, MD  REFERRING DIAG: [phone] (ICD-10-CM) - Other specified postprocedural states C50.311 (ICD-10-CM) - Malignant neoplasm of lower-inner quadrant of right female breast Z17.0 (ICD-10-CM) - Estrogen receptor positive status (ER+) C50.912 (ICD-10-CM) - Malignant neoplasm of unspecified site of left female breast  THERAPY DIAG:  Malignant neoplasm of lower-inner quadrant of right breast of female, estrogen receptor positive (HCC)  Malignant neoplasm of upper-inner quadrant of left breast in female, estrogen receptor negative (HCC)  Aftercare following surgery for neoplasm  Stiffness of right shoulder, not elsewhere classified  Stiffness of left shoulder, not elsewhere classified  Abnormal posture  Rationale for Evaluation and Treatment: Rehabilitation  ONSET DATE: 04/16/24  SUBJECTIVE:  SUBJECTIVE STATEMENT: I feel like ROM is improving. Left side is still the worst . Still have a lot of tightness in the left pectorals. Right side is doing pretty well. Still feel some cording on the right, and the left axillary region is still tight.  Eval: Patient was referred due to shoulder ROM and strength deficits bilaterally. She reports her incision site is doing a lot better. Patient reports her pain has gotten up to a 7/10 at its worst, and a 3/10 at its best. The burning sensation goes all the way into her hand. The patient has a compression sleeve however it is so tight that it has been bruising her wrist. Patient will wake up in the middle of the night with both arms feeling like they are asleep. She has been sleeping on the sofa since surgery as she cannot get comfortable in bed.   PERTINENT HISTORY:  Patient was diagnosed  on 04/01/24 with right grade 2 IDC. It measured 2 cm and is located on the lower inner quadrant. Patient had a right mastectomy and sentinel node biopsy on 04/16/24. The mastectomy took 3 months to heal and underwent additional surgery to remove more tissue from the area about a month ago. Patient was diagnosed on 09/22/23 with left grade 3 IDC. It measures at least 5mm and is located in the upper inner quadrant. It is ER/PR neg, HER2 unknown with a Ki67 unknown with 1 metastatic lymph node. Other hx: Cervical fusion.   PATIENT GOALS:  Reassess how my recovery is going related to arm function, pain, and swelling.  PAIN:  Are you having pain?PAIN:  Are you having pain? No  PRECAUTIONS: Recent Surgery, bilateral UE Lymphedema risk, hx of compression fracture of her vertebrae ~4 years ago  RED FLAGS: None   ACTIVITY LEVEL / LEISURE: Does music for church, plays the piano   OBJECTIVE:   PATIENT SURVEYS:  QUICK DASH: 56.82% on 08/27/24  OBSERVATIONS: - Chest skin mobile bilaterally - Incision on Rt side had multiple small, open spots; mild drainage observed   POSTURE:  Forward head and rounded shoulders  LYMPHEDEMA ASSESSMENT:   UPPER EXTREMITY AROM/PROM:   A/PROM RIGHT   eval   RIGHT 08/27/24 RIGHT 09/30/2024  Shoulder extension 50 46   Shoulder flexion 145 109 147  Shoulder abduction 165 89 p! 155  Shoulder internal rotation 75    Shoulder external rotation 80                            (Blank rows = not tested)   A/PROM LEFT   eval LEFT  08/27/24 LEFT 09/30/2024  Shoulder extension 60 58   Shoulder flexion 150 140 150  Shoulder abduction 160 117 p! 149  Shoulder internal rotation 75 66   Shoulder external rotation 75 60                           (Blank rows = not tested)   CERVICAL AROM:     Percent limited EVAL  Flexion    Extension 50%  Right lateral flexion    Left lateral flexion    Right rotation 50%  Left rotation        UPPER EXTREMITY STRENGTH:      Eval:  5/5 Lt   5/5 Rt except for ER at 4/5   LYMPHEDEMA ASSESSMENTS (in cm):    LANDMARK RIGHT   eval RIGHT  08/27/24  10 cm proximal to olecranon process 32 28.2  Olecranon process 28 25.1  10 cm proximal to ulnar styloid process 20.9 19.9  Just proximal to ulnar styloid process 17.2 16.4  Across hand at thumb web space 19.3 18.8  At base of 2nd digit 6.6 6.5  (Blank rows = not tested)   LANDMARK LEFT   eval LEFT  08/27/24  10 cm proximal to olecranon process 31.8 28.5  Olecranon process 28.5 25.3  10 cm proximal to ulnar styloid process 21.4 20.1  Just proximal to ulnar styloid process 17.8 16.4  Across hand at thumb web space 19.5 18.3  At base of 2nd digit 6.6 6.8  (Blank rows = not tested)  TREATMENT DATE:   09/30/2024 Supine wand flexion and scaption x 5 Supine AROM alternating shoulder flexion x 5, bilateral scaption x 5, horizontal abd x 5, snow angels x 5 MFR to left chest region, and bilateral pectorals and upper arm areas of cording PROM bilateral shoulder flexion, scaption, abd, ER Measured AROM 09/23/24 Manual Therapy: MFR to bil chest focusing at the pectoralis. Then PROM into flexion, abduction, D2 with cording pinning.  Therapeutic Exercise: Alternating flexion x 10 Dowel chest press 4# x 12 Supine chest stretch 10 x 5 Row yellow x 15  09/20/24 Supine hooklying on large table: prolonged chest stretch 3x30  Alternating flexion x 10 Dowel chest press 3# x 12 Supine chest stretch 10 x 5 Row yellow x 15 MFR to left chest focusing at the pectoralis and an area that feels like cording at the lateral mastectomy incision.  Then PROM into flexion, abduction, D2 with cording pinning.  STM to the Rt pectoralis and PROM right shoulder flexion, scaption, abd, Er  09/17/24 Supine hooklying on large table: prolonged chest stretch 3x20  Alternating flexion x 10, snow angel x 10, Y AROM bil x 10 Dowel chest press 3# x 12 Supine chest stretch 10 x  5 MFR to right axilla and UE cording, and gently to right chest area PROM right shoulder flexion, scaption, abd, Er  09/12/2024 Supine wand flexion and scaption x 5 3 D AROM bilateral shoulders flexion, scaption, horizontal abd x 5 Soft tissue mobilization bilateral UT/posterior cervicals with cocoa butter with emphais on left MFR to right axilla and UE cording, and gently to right chest area PROM right shoulder flexion, scaption, abd, Er  PATIENT EDUCATION:  Education details: POC, HEP, not continuing SOZO Person educated: Patient Education method: Explanation and Handouts Education comprehension: verbalized understanding  HOME EXERCISE PROGRAM: Reviewed previously given post op HEP.  Access Code: [email or web address] URL: [email or web address] Date: 08/27/2024 Prepared by: Delon Pack  Exercises - Seated Shoulder Flexion Towel Slide at Table Top  - 1-2 x daily - 7 x weekly - 1 sets - 10 reps - 2-3 sec hold - Seated Shoulder Abduction Towel Slide at Table Top  - 1-2 x daily - 7 x weekly - 1 sets - 10 reps - 2-3 hold -As well as post op sheet  ASSESSMENT:  CLINICAL IMPRESSION:  Pt has made excellent improvement with bilateral shoulder AROM. She still finds things more difficult/tighter on the left. Tightness in pecs/ cords is a limiting factor for ROM, however, pt has been working very hard with good results. Incisions still very tight. In axillary region.  PT treatment/interventions: ADL/Self care home management, [phone]- PT Re-evaluation, 97110-Therapeutic exercises, 97530- Therapeutic activity, [id number]- Neuromuscular re-education, 97535- Self Care, [id number]- Manual therapy, Patient/Family education, Manual lymph drainage, Scar mobilization, Compression bandaging,  and DME instructions   GOALS: Goals reviewed with patient? Yes  GOALS MET AT EVAL:  GOALS Name Target Date Goal status  1 Pt will be able to verbalize understanding of pertinent lymphedema risk reduction  practices relevant to her dx specifically related to skin care.  Baseline:  No knowledge Eval Achieved at eval  2 Pt will be able to return demo and/or verbalize understanding of the post op HEP related to regaining shoulder ROM. Baseline:  No knowledge Eval Achieved at eval  3 Pt will be able to verbalize understanding of the importance of viewing the post op After Breast CA Class video for further lymphedema risk reduction education and therapeutic exercise.  Baseline:  No knowledge Eval Achieved at eval   LONG TERM GOALS:  (STG=LTG)  GOALS Name Target Date  Goal status  1 Pt will demonstrate she has regained full shoulder ROM and function post operatively compared to baselines.  Baseline: 10/09/23 INITIAL  2 Patient will be able to play the piano for 30 minutes with no pain in order to play during church service 10/09/23 INITIAL  3 Patient will improve bilateral shoulder AROM to be at least back to her baseline measurements for functional mobility 10/09/23 INITIAL  4 Patient will decrease her Quick-DASH form by at least 15 points to demonstrate functional improvement 10/09/23 INITIAL     PLAN:  PT FREQUENCY/DURATION: 2x week for 6 weeks  PLAN FOR NEXT SESSION: how is the Lt side? Shoulder PROM bil, cording work both - maybe be done before 10/03/24  Grayce Sheldon, PT 09/30/2024, 1:05 PM     "

## 2024-10-02 ENCOUNTER — Ambulatory Visit

## 2024-10-02 DIAGNOSIS — C50212 Malignant neoplasm of upper-inner quadrant of left female breast: Secondary | ICD-10-CM

## 2024-10-02 DIAGNOSIS — R293 Abnormal posture: Secondary | ICD-10-CM

## 2024-10-02 DIAGNOSIS — M25612 Stiffness of left shoulder, not elsewhere classified: Secondary | ICD-10-CM

## 2024-10-02 DIAGNOSIS — Z483 Aftercare following surgery for neoplasm: Secondary | ICD-10-CM

## 2024-10-02 DIAGNOSIS — C50311 Malignant neoplasm of lower-inner quadrant of right female breast: Secondary | ICD-10-CM | POA: Diagnosis not present

## 2024-10-02 DIAGNOSIS — M25611 Stiffness of right shoulder, not elsewhere classified: Secondary | ICD-10-CM

## 2024-10-02 NOTE — Therapy (Signed)
 " OUTPATIENT PHYSICAL THERAPY BREAST CANCER POST OP FOLLOW UP   Patient Name: Rebecca Steele MRN: [id number] DOB:11-28-1950, 73 y.o., female Today's Date: 10/02/2024  END OF SESSION:  PT End of Session - 10/02/24 1204     Visit Number 9    Number of Visits 13    Date for Recertification  10/08/24    Authorization Type no auth    PT Start Time 1204    PT Stop Time 1303    PT Time Calculation (min) 59 min    Activity Tolerance Patient tolerated treatment well    Behavior During Therapy WFL for tasks assessed/performed            Past Medical History:  Diagnosis Date   Breast mass, right    Cancer (HCC) 10/2023   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   Sleep apnea    HAS MILD OSA, NO CPAP NEEDED   Past Surgical History:  Procedure Laterality Date   APPENDECTOMY     AXILLARY SENTINEL NODE BIOPSY Right 04/16/2024   Procedure: BIOPSY, LYMPH NODE, SENTINEL, AXILLARY;  Surgeon: Ebbie Cough, MD;  Location: [facility];  Service: General;  Laterality: Right;  RIGHT MASTY RIGHT AXILLARY SENTINEL NODE BIOPSY   BREAST BIOPSY Right 09/16/2022   MM RT BREAST BX W LOC DEV 1ST LESION IMAGE BX SPEC STEREO GUIDE 09/16/2022 [facility]   BREAST BIOPSY  12/06/2022   MM RT RADIOACTIVE SEED LOC MAMMO GUIDE 12/06/2022 [facility]   BREAST BIOPSY Left 11/16/2023   US  LT RADIOACTIVE SEED LOC 11/16/2023 [facility]   BREAST BIOPSY  11/16/2023   MM LT RADIOACTIVE SEED EA ADD LESION LOC MAMMO GUIDE 11/16/2023 [facility]   BREAST BIOPSY Right  04/01/2024   US  RT BREAST BX W LOC DEV 1ST LESION IMG BX SPEC US  GUIDE 04/01/2024 [facility]   BREAST EXCISIONAL BIOPSY Right 2018   BREAST LUMPECTOMY WITH RADIOACTIVE SEED AND SENTINEL LYMPH NODE BIOPSY Left 11/20/2023   Procedure: LEFT BREAST SEED GUIDED LUMPECTOMY, LEFT AXILLARY SENTINEL NODE BIOPSY;  Surgeon: Ebbie Cough, MD;  Location: [facility];  Service: General;  Laterality: Left;  PEC BLOCK   CHOLECYSTECTOMY     CYSTOSCOPY W/ RETROGRADES     KYPHOPLASTY N/A 10/05/2021   Procedure: LUMBAR ONE KYPHOPLASTY;  Surgeon: Cheryle Debby LABOR, MD;  Location: [facility];  Service: Neurosurgery;  Laterality: N/A;   MASS EXCISION Right 07/25/2024   Procedure: EXCISION, MASS, CHEST WALL;  Surgeon: Ebbie Cough, MD;  Location: [facility];  Service: General;  Laterality: Right;  EXCISION RIGHT MASTECTOMY WOUND   NECK SURGERY     PORTACATH PLACEMENT Right 10/12/2023   Procedure: INSERTION PORT-A-CATH WITH GUIDED ULTRASOUND;  Surgeon: Ebbie Cough, MD;  Location: [facility];  Service: General;  Laterality: Right;   RADIOACTIVE SEED GUIDED AXILLARY SENTINEL LYMPH NODE Left 11/20/2023   Procedure: LEFT AXILLARY NODE SEED GUIDED EXCISION;  Surgeon: Ebbie Cough, MD;  Location:  [facility];  Service: General;  Laterality: Left;   RADIOACTIVE SEED GUIDED EXCISIONAL BREAST BIOPSY Right 12/12/2017   Procedure: RIGHT RADIOACTIVE SEED GUIDED EXCISIONAL BREAST BIOPSY ERAS PATHWAY;  Surgeon: Ebbie Cough, MD;  Location: [facility];  Service: General;  Laterality: Right;  LMA   RADIOACTIVE SEED GUIDED EXCISIONAL BREAST BIOPSY Right 12/07/2022   Procedure: RADIOACTIVE SEED GUIDED EXCISIONAL RIGHT BREAST BIOPSY;  Surgeon: Ebbie Cough, MD;  Location: [facility];  Service: General;  Laterality: Right;   SHOULDER SURGERY     SIMPLE MASTECTOMY WITH AXILLARY SENTINEL NODE BIOPSY Left 12/12/2023    Procedure: LEFT  MASTECTOMY;  Surgeon: Ebbie Cough, MD;  Location: [facility];  Service: General;  Laterality: Left;   TONSILLECTOMY     TOTAL HIP ARTHROPLASTY Left 06/04/2021   Procedure: TOTAL HIP ARTHROPLASTY ANTERIOR APPROACH;  Surgeon: Yvone Rush, MD;  Location: [facility];  Service: Orthopedics;  Laterality: Left;   TOTAL MASTECTOMY Right 04/16/2024   Procedure: MASTECTOMY, SIMPLE;  Surgeon: Ebbie Cough, MD;  Location: [facility];  Service: General;  Laterality: Right;   TRIGGER FINGER RELEASE Right 05/18/2015   Procedure: RIGHT  LONG FINGER TRIGGER RELEASE ;  Surgeon: Alm Hummer, MD;  Location: [facility];  Service: Orthopedics;  Laterality: Right;   TUBAL LIGATION     Patient Active Problem List   Diagnosis Date Noted   Malignant neoplasm of right breast (HCC) 05/09/2024   S/P mastectomy, right 04/16/2024   Intractable nausea and vomiting 04/01/2024   Dehydration 04/01/2024   Epigastric pain 04/01/2024   High anion gap metabolic acidosis 04/01/2024   Hypercalcemia 04/01/2024   Hyperbilirubinemia 04/01/2024   Hypophosphatemia 04/01/2024   Chronic diastolic CHF (congestive heart failure) (HCC) 04/01/2024   Acquired hypothyroidism 04/01/2024   Allergic rhinitis 04/01/2024   Depression 04/01/2024   Nausea & vomiting 03/10/2024   Chemotherapy induced nausea and vomiting 03/10/2024   S/P left mastectomy 12/12/2023   Breast cancer, left breast (HCC) 11/20/2023   Port-A-Cath in place 10/18/2023   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) 10/09/2023   Closed compression fracture of body of L1 vertebra (HCC) 10/05/2021   Compression fracture of L1 lumbar vertebra (HCC) 10/04/2021   Fall at home, initial encounter 10/04/2021   Hypokalemia 10/04/2021   Scalp laceration 10/04/2021   Leukocytosis 10/04/2021   Primary osteoarthritis of left hip 06/04/2021   Atypical ductal hyperplasia of right breast 03/19/2018    Obtundation 12/12/2017    PCP: Jon Kiang, MD  REFERRING PROVIDER: Marena Ebbie, MD  REFERRING DIAG: [phone] (ICD-10-CM) - Other specified postprocedural states C50.311 (ICD-10-CM) - Malignant neoplasm of lower-inner quadrant of right female breast Z17.0 (ICD-10-CM) - Estrogen receptor positive status (ER+) C50.912 (ICD-10-CM) - Malignant neoplasm of unspecified site of left female breast  THERAPY DIAG:  Malignant neoplasm of lower-inner quadrant of right breast of female, estrogen receptor positive (HCC)  Malignant neoplasm of upper-inner quadrant of left breast in female, estrogen receptor negative (HCC)  Aftercare following surgery for neoplasm  Stiffness of right shoulder, not elsewhere classified  Stiffness of left shoulder, not elsewhere classified  Abnormal posture  Rationale for Evaluation and Treatment: Rehabilitation  ONSET DATE: 04/16/24  SUBJECTIVE:  SUBJECTIVE STATEMENT:  Left side is most uncomfortable but its not nearly as bad as it was.Don't feel the cord as much on the right side. I feel like I am ready to be discharged today.   Eval: Patient was referred due to shoulder ROM and strength deficits bilaterally. She reports her incision site is doing a lot better. Patient reports her pain has gotten up to a 7/10 at its worst, and a 3/10 at its best. The burning sensation goes all the way into her hand. The patient has a compression sleeve however it is so tight that it has been bruising her wrist. Patient will wake up in the middle of the night with both arms feeling like they are asleep. She has been sleeping on the sofa since surgery as she cannot get comfortable in bed.   PERTINENT HISTORY:  Patient was diagnosed on 04/01/24 with right grade 2 IDC. It measured 2 cm and is  located on the lower inner quadrant. Patient had a right mastectomy and sentinel node biopsy on 04/16/24. The mastectomy took 3 months to heal and underwent additional surgery to remove more tissue from the area about a month ago. Patient was diagnosed on 09/22/23 with left grade 3 IDC. It measures at least 5mm and is located in the upper inner quadrant. It is ER/PR neg, HER2 unknown with a Ki67 unknown with 1 metastatic lymph node. Other hx: Cervical fusion.   PATIENT GOALS:  Reassess how my recovery is going related to arm function, pain, and swelling.  PAIN:  Are you having pain?PAIN:  Are you having pain? No  PRECAUTIONS: Recent Surgery, bilateral UE Lymphedema risk, hx of compression fracture of her vertebrae ~4 years ago  RED FLAGS: None   ACTIVITY LEVEL / LEISURE: Does music for church, plays the piano   OBJECTIVE:   PATIENT SURVEYS:  QUICK DASH: 56.82% on 08/27/24  OBSERVATIONS: - Chest skin mobile bilaterally - Incision on Rt side had multiple small, open spots; mild drainage observed   POSTURE:  Forward head and rounded shoulders  LYMPHEDEMA ASSESSMENT:   UPPER EXTREMITY AROM/PROM:   A/PROM RIGHT   eval   RIGHT 08/27/24 RIGHT 09/30/2024 RIGHT 10/02/2024  Shoulder extension 50 46    Shoulder flexion 145 109 147 147  Shoulder abduction 165 89 p! 155 155  Shoulder internal rotation 75     Shoulder external rotation 80                             (Blank rows = not tested)   A/PROM LEFT   eval LEFT  08/27/24 LEFT 09/30/2024 LEFT 10/02/2024  Shoulder extension 60 58    Shoulder flexion 150 140 150 149  Shoulder abduction 160 117 p! 149 156  Shoulder internal rotation 75 66    Shoulder external rotation 75 60                            (Blank rows = not tested)   CERVICAL AROM:     Percent limited EVAL  Flexion    Extension 50%  Right lateral flexion    Left lateral flexion    Right rotation 50%  Left rotation        UPPER EXTREMITY STRENGTH:      Eval:  5/5 Lt   5/5 Rt except for ER at 4/5   LYMPHEDEMA ASSESSMENTS (in cm):    [facility]  RIGHT   eval RIGHT 08/27/24  10 cm proximal to olecranon process 32 28.2  Olecranon process 28 25.1  10 cm proximal to ulnar styloid process 20.9 19.9  Just proximal to ulnar styloid process 17.2 16.4  Across hand at thumb web space 19.3 18.8  At base of 2nd digit 6.6 6.5  (Blank rows = not tested)   LANDMARK LEFT   eval LEFT  08/27/24  10 cm proximal to olecranon process 31.8 28.5  Olecranon process 28.5 25.3  10 cm proximal to ulnar styloid process 21.4 20.1  Just proximal to ulnar styloid process 17.8 16.4  Across hand at thumb web space 19.5 18.3  At base of 2nd digit 6.6 6.8  (Blank rows = not tested)  TREATMENT DATE:   10/02/2024 Standing Scapular retraction with yellow x 15 Shoulder extension with yellow x 10 Supine AROM alternating shoulder flexion x 5, bilateral scaption x 5, horizontal abd x 5, snow angels x 5 MFR to bilateralchest region, and bilateral pectorals and upper arm areas of cording STM with cocoa butter to left pectorals and UT PROM bilateral shoulder flexion, scaption, abd, ER Assessed goals for DC; Quick dash 15.91 09/30/2024 Supine wand flexion and scaption x 5 Supine AROM alternating shoulder flexion x 5, bilateral scaption x 5, horizontal abd x 5, snow angels x 5 MFR to left chest region, and bilateral pectorals and upper arm areas of cording PROM bilateral shoulder flexion, scaption, abd, ER Measured AROM 09/23/24 Manual Therapy: MFR to bil chest focusing at the pectoralis. Then PROM into flexion, abduction, D2 with cording pinning.  Therapeutic Exercise: Alternating flexion x 10 Dowel chest press 4# x 12 Supine chest stretch 10 x 5 Row yellow x 15  09/20/24 Supine hooklying on large table: prolonged chest stretch 3x30  Alternating flexion x 10 Dowel chest press 3# x 12 Supine chest stretch 10 x 5 Row yellow x 15 MFR to left chest  focusing at the pectoralis and an area that feels like cording at the lateral mastectomy incision.  Then PROM into flexion, abduction, D2 with cording pinning.  STM to the Rt pectoralis and PROM right shoulder flexion, scaption, abd, Er  09/17/24 Supine hooklying on large table: prolonged chest stretch 3x20  Alternating flexion x 10, snow angel x 10, Y AROM bil x 10 Dowel chest press 3# x 12 Supine chest stretch 10 x 5 MFR to right axilla and UE cording, and gently to right chest area PROM right shoulder flexion, scaption, abd, Er  09/12/2024 Supine wand flexion and scaption x 5 3 D AROM bilateral shoulders flexion, scaption, horizontal abd x 5 Soft tissue mobilization bilateral UT/posterior cervicals with cocoa butter with emphais on left MFR to right axilla and UE cording, and gently to right chest area PROM right shoulder flexion, scaption, abd, Er  PATIENT EDUCATION:  Education details: POC, HEP, not continuing SOZO Person educated: Patient Education method: Explanation and Handouts Education comprehension: verbalized understanding  HOME EXERCISE PROGRAM: Reviewed previously given post op HEP.  Access Code: [email or web address] URL: [email or web address] Date: 08/27/2024 Prepared by: Delon Pack  Exercises - Seated Shoulder Flexion Towel Slide at Table Top  - 1-2 x daily - 7 x weekly - 1 sets - 10 reps - 2-3 sec hold - Seated Shoulder Abduction Towel Slide at Table Top  - 1-2 x daily - 7 x weekly - 1 sets - 10 reps - 2-3 hold -As well as post op sheet  ASSESSMENT:  CLINICAL IMPRESSION:  Pt has made excellent  improvement with bilateral shoulder AROM. She still finds things more difficult/tighter on the left. Incisions are restricted bilaterally at the outer edges. She has achieved or partially achieved all goals and feels she is ready to be released to independence with a HEP. Mild deficits in abd. ROM remain, but functionally she is doing all home activities and is  playing the piano for church.  PT treatment/interventions: ADL/Self care home management, [phone]- PT Re-evaluation, 97110-Therapeutic exercises, 97530- Therapeutic activity, [id number]- Neuromuscular re-education, 97535- Self Care, [id number]- Manual therapy, Patient/Family education, Manual lymph drainage, Scar mobilization, Compression bandaging, and DME instructions   GOALS: Goals reviewed with patient? Yes  GOALS MET AT EVAL:  GOALS Name Target Date Goal status  1 Pt will be able to verbalize understanding of pertinent lymphedema risk reduction practices relevant to her dx specifically related to skin care.  Baseline:  No knowledge Eval Achieved at eval  2 Pt will be able to return demo and/or verbalize understanding of the post op HEP related to regaining shoulder ROM. Baseline:  No knowledge Eval Achieved at eval  3 Pt will be able to verbalize understanding of the importance of viewing the post op After Breast CA Class video for further lymphedema risk reduction education and therapeutic exercise.  Baseline:  No knowledge Eval Achieved at eval   LONG TERM GOALS:  (STG=LTG)  GOALS Name Target Date  Goal status  1 Pt will demonstrate she has regained full shoulder ROM and function post operatively compared to baselines.  Baseline: 10/09/23 INITIAL Partially MET, minimally decreased abd  2 Patient will be able to play the piano for 30 minutes with no pain in order to play during church service 10/09/23 MET12/31/2025  3 Patient will improve bilateral shoulder AROM to be at least back to her baseline measurements for functional mobility 10/09/23 MET for flexion, mildly decreased  ABD 10/02/2024  4 Patient will decrease her Quick-DASH form by at least 15 points to demonstrate functional improvement 10/09/23 MET 10/02/2024     PLAN:  PT FREQUENCY/DURATION: 2x week for 6 weeks  PLAN FOR NEXT SESSION: Discharged to HEP PHYSICAL THERAPY DISCHARGE SUMMARY  Visits from Start of Care: 9  Current  functional level related to goals / functional outcomes: Achieved or partially achieved all goals   Remaining deficits: Mild ROM deficits for abduction   Education / Equipment: HEP/theraband   Patient agrees to discharge. Patient goals were partially met. Patient is being discharged due to being pleased with the current functional level.   Grayce Sheldon, PT 10/02/2024, 1:11 PM     "

## 2024-10-08 ENCOUNTER — Ambulatory Visit: Admitting: Rehabilitation

## 2024-10-10 ENCOUNTER — Ambulatory Visit: Admitting: Rehabilitation

## 2024-10-11 ENCOUNTER — Telehealth: Payer: Self-pay | Admitting: Adult Health

## 2024-10-11 NOTE — Telephone Encounter (Signed)
 I spoke with patient and she is aware of rescheduled SCP appointment from 11/05/2024 to 11/15/2024.

## 2024-10-14 ENCOUNTER — Ambulatory Visit: Attending: General Surgery

## 2024-11-05 ENCOUNTER — Inpatient Hospital Stay: Admitting: Adult Health

## 2024-11-08 ENCOUNTER — Encounter: Payer: Self-pay | Admitting: Hematology and Oncology

## 2024-11-15 ENCOUNTER — Inpatient Hospital Stay: Attending: Hematology and Oncology | Admitting: Adult Health

## 2025-02-11 ENCOUNTER — Inpatient Hospital Stay: Admitting: Hematology and Oncology
